# Patient Record
Sex: Male | Born: 1950 | Race: White | Hispanic: No | Marital: Married | Smoking: Never smoker
Health system: Southern US, Community
[De-identification: ages and names within clinical notes are randomized; demographics above are authoritative.]

## PROBLEM LIST (undated history)

## (undated) DIAGNOSIS — T7840XA Allergy, unspecified, initial encounter: Secondary | ICD-10-CM

## (undated) DIAGNOSIS — G4730 Sleep apnea, unspecified: Secondary | ICD-10-CM

## (undated) DIAGNOSIS — R3911 Hesitancy of micturition: Secondary | ICD-10-CM

## (undated) DIAGNOSIS — R569 Unspecified convulsions: Secondary | ICD-10-CM

## (undated) DIAGNOSIS — F329 Major depressive disorder, single episode, unspecified: Secondary | ICD-10-CM

## (undated) DIAGNOSIS — K5790 Diverticulosis of intestine, part unspecified, without perforation or abscess without bleeding: Secondary | ICD-10-CM

## (undated) DIAGNOSIS — G20 Parkinson's disease: Secondary | ICD-10-CM

## (undated) DIAGNOSIS — F32A Depression, unspecified: Secondary | ICD-10-CM

## (undated) DIAGNOSIS — E119 Type 2 diabetes mellitus without complications: Secondary | ICD-10-CM

## (undated) DIAGNOSIS — D126 Benign neoplasm of colon, unspecified: Secondary | ICD-10-CM

## (undated) DIAGNOSIS — G20A1 Parkinson's disease without dyskinesia, without mention of fluctuations: Secondary | ICD-10-CM

## (undated) DIAGNOSIS — I10 Essential (primary) hypertension: Secondary | ICD-10-CM

## (undated) DIAGNOSIS — L570 Actinic keratosis: Secondary | ICD-10-CM

## (undated) HISTORY — DX: Sleep apnea, unspecified: G47.30

## (undated) HISTORY — DX: Major depressive disorder, single episode, unspecified: F32.9

## (undated) HISTORY — DX: Actinic keratosis: L57.0

## (undated) HISTORY — PX: HERNIA REPAIR: SHX51

## (undated) HISTORY — DX: Hesitancy of micturition: R39.11

## (undated) HISTORY — DX: Benign neoplasm of colon, unspecified: D12.6

## (undated) HISTORY — DX: Unspecified convulsions: R56.9

## (undated) HISTORY — DX: Depression, unspecified: F32.A

## (undated) HISTORY — DX: Allergy, unspecified, initial encounter: T78.40XA

## (undated) HISTORY — DX: Essential (primary) hypertension: I10

## (undated) HISTORY — PX: UVULOPALATOPHARYNGOPLASTY, TONSILLECTOMY AND SEPTOPLASTY: SHX2632

## (undated) HISTORY — DX: Diverticulosis of intestine, part unspecified, without perforation or abscess without bleeding: K57.90

## (undated) HISTORY — DX: Type 2 diabetes mellitus without complications: E11.9

## (undated) HISTORY — PX: COLON SURGERY: SHX602

---

## 2001-03-23 ENCOUNTER — Ambulatory Visit (HOSPITAL_BASED_OUTPATIENT_CLINIC_OR_DEPARTMENT_OTHER): Admission: RE | Admit: 2001-03-23 | Discharge: 2001-03-23 | Payer: Self-pay | Admitting: Otolaryngology

## 2005-02-08 ENCOUNTER — Ambulatory Visit: Payer: Self-pay | Admitting: Surgery

## 2009-04-06 ENCOUNTER — Ambulatory Visit: Payer: Self-pay | Admitting: Gastroenterology

## 2014-08-29 ENCOUNTER — Ambulatory Visit: Payer: Self-pay | Admitting: Gastroenterology

## 2015-01-02 LAB — SURGICAL PATHOLOGY

## 2015-06-27 ENCOUNTER — Other Ambulatory Visit: Payer: Self-pay | Admitting: Orthopedic Surgery

## 2015-06-27 DIAGNOSIS — M5416 Radiculopathy, lumbar region: Secondary | ICD-10-CM

## 2015-07-04 ENCOUNTER — Ambulatory Visit
Admission: RE | Admit: 2015-07-04 | Discharge: 2015-07-04 | Disposition: A | Discharge status: Home / Self Care | Payer: BLUE CROSS/BLUE SHIELD | Source: Ambulatory Visit | Attending: Orthopedic Surgery | Admitting: Orthopedic Surgery

## 2015-07-04 DIAGNOSIS — M79605 Pain in left leg: Secondary | ICD-10-CM | POA: Diagnosis present

## 2015-07-04 DIAGNOSIS — M47896 Other spondylosis, lumbar region: Secondary | ICD-10-CM | POA: Insufficient documentation

## 2015-07-04 DIAGNOSIS — M4806 Spinal stenosis, lumbar region: Secondary | ICD-10-CM | POA: Insufficient documentation

## 2015-07-04 DIAGNOSIS — M5136 Other intervertebral disc degeneration, lumbar region: Secondary | ICD-10-CM | POA: Insufficient documentation

## 2015-07-04 DIAGNOSIS — M545 Low back pain: Secondary | ICD-10-CM | POA: Diagnosis present

## 2015-07-04 DIAGNOSIS — M5416 Radiculopathy, lumbar region: Secondary | ICD-10-CM

## 2015-07-04 DIAGNOSIS — R200 Anesthesia of skin: Secondary | ICD-10-CM | POA: Diagnosis present

## 2015-07-13 DIAGNOSIS — M5136 Other intervertebral disc degeneration, lumbar region: Secondary | ICD-10-CM | POA: Insufficient documentation

## 2015-11-08 DIAGNOSIS — K209 Esophagitis, unspecified: Secondary | ICD-10-CM | POA: Diagnosis not present

## 2015-11-08 DIAGNOSIS — G20 Parkinson's disease: Secondary | ICD-10-CM | POA: Diagnosis not present

## 2015-11-08 DIAGNOSIS — I10 Essential (primary) hypertension: Secondary | ICD-10-CM | POA: Diagnosis not present

## 2015-12-29 DIAGNOSIS — G20 Parkinson's disease: Secondary | ICD-10-CM | POA: Diagnosis not present

## 2015-12-29 DIAGNOSIS — E1165 Type 2 diabetes mellitus with hyperglycemia: Secondary | ICD-10-CM | POA: Diagnosis not present

## 2015-12-29 DIAGNOSIS — I10 Essential (primary) hypertension: Secondary | ICD-10-CM | POA: Diagnosis not present

## 2015-12-29 DIAGNOSIS — J069 Acute upper respiratory infection, unspecified: Secondary | ICD-10-CM | POA: Diagnosis not present

## 2015-12-29 DIAGNOSIS — E1129 Type 2 diabetes mellitus with other diabetic kidney complication: Secondary | ICD-10-CM | POA: Diagnosis not present

## 2015-12-29 DIAGNOSIS — R809 Proteinuria, unspecified: Secondary | ICD-10-CM | POA: Diagnosis not present

## 2016-01-03 DIAGNOSIS — E1129 Type 2 diabetes mellitus with other diabetic kidney complication: Secondary | ICD-10-CM | POA: Diagnosis not present

## 2016-01-03 DIAGNOSIS — I10 Essential (primary) hypertension: Secondary | ICD-10-CM | POA: Diagnosis not present

## 2016-01-03 DIAGNOSIS — G20 Parkinson's disease: Secondary | ICD-10-CM | POA: Diagnosis not present

## 2016-01-03 DIAGNOSIS — R809 Proteinuria, unspecified: Secondary | ICD-10-CM | POA: Diagnosis not present

## 2016-01-03 DIAGNOSIS — E1165 Type 2 diabetes mellitus with hyperglycemia: Secondary | ICD-10-CM | POA: Diagnosis not present

## 2016-01-03 DIAGNOSIS — M5136 Other intervertebral disc degeneration, lumbar region: Secondary | ICD-10-CM | POA: Diagnosis not present

## 2016-01-10 DIAGNOSIS — G20 Parkinson's disease: Secondary | ICD-10-CM | POA: Diagnosis not present

## 2016-01-10 DIAGNOSIS — E119 Type 2 diabetes mellitus without complications: Secondary | ICD-10-CM | POA: Insufficient documentation

## 2016-01-10 DIAGNOSIS — G4730 Sleep apnea, unspecified: Secondary | ICD-10-CM | POA: Diagnosis not present

## 2016-01-10 DIAGNOSIS — F3342 Major depressive disorder, recurrent, in full remission: Secondary | ICD-10-CM | POA: Diagnosis not present

## 2016-01-10 DIAGNOSIS — G4710 Hypersomnia, unspecified: Secondary | ICD-10-CM | POA: Diagnosis not present

## 2016-01-10 DIAGNOSIS — Z125 Encounter for screening for malignant neoplasm of prostate: Secondary | ICD-10-CM | POA: Diagnosis not present

## 2016-01-10 DIAGNOSIS — I10 Essential (primary) hypertension: Secondary | ICD-10-CM | POA: Diagnosis not present

## 2016-01-10 DIAGNOSIS — E7800 Pure hypercholesterolemia, unspecified: Secondary | ICD-10-CM | POA: Diagnosis not present

## 2016-01-10 DIAGNOSIS — R809 Proteinuria, unspecified: Secondary | ICD-10-CM | POA: Insufficient documentation

## 2016-01-10 DIAGNOSIS — E1169 Type 2 diabetes mellitus with other specified complication: Secondary | ICD-10-CM | POA: Insufficient documentation

## 2016-01-10 DIAGNOSIS — E118 Type 2 diabetes mellitus with unspecified complications: Secondary | ICD-10-CM | POA: Diagnosis not present

## 2016-01-10 DIAGNOSIS — M5136 Other intervertebral disc degeneration, lumbar region: Secondary | ICD-10-CM | POA: Diagnosis not present

## 2016-01-11 DIAGNOSIS — F418 Other specified anxiety disorders: Secondary | ICD-10-CM | POA: Diagnosis not present

## 2016-01-11 DIAGNOSIS — R4189 Other symptoms and signs involving cognitive functions and awareness: Secondary | ICD-10-CM | POA: Diagnosis not present

## 2016-01-11 DIAGNOSIS — G20 Parkinson's disease: Secondary | ICD-10-CM | POA: Diagnosis not present

## 2016-01-22 DIAGNOSIS — B351 Tinea unguium: Secondary | ICD-10-CM | POA: Diagnosis not present

## 2016-01-22 DIAGNOSIS — L851 Acquired keratosis [keratoderma] palmaris et plantaris: Secondary | ICD-10-CM | POA: Diagnosis not present

## 2016-01-22 DIAGNOSIS — E1142 Type 2 diabetes mellitus with diabetic polyneuropathy: Secondary | ICD-10-CM | POA: Diagnosis not present

## 2016-02-01 ENCOUNTER — Encounter: Payer: Self-pay | Admitting: Psychiatry

## 2016-02-01 ENCOUNTER — Ambulatory Visit (INDEPENDENT_AMBULATORY_CARE_PROVIDER_SITE_OTHER): Payer: PPO | Admitting: Psychiatry

## 2016-02-01 DIAGNOSIS — I10 Essential (primary) hypertension: Secondary | ICD-10-CM | POA: Insufficient documentation

## 2016-02-01 DIAGNOSIS — F32A Depression, unspecified: Secondary | ICD-10-CM | POA: Insufficient documentation

## 2016-02-01 DIAGNOSIS — G4710 Hypersomnia, unspecified: Secondary | ICD-10-CM | POA: Insufficient documentation

## 2016-02-01 DIAGNOSIS — N529 Male erectile dysfunction, unspecified: Secondary | ICD-10-CM | POA: Insufficient documentation

## 2016-02-01 DIAGNOSIS — G20 Parkinson's disease: Secondary | ICD-10-CM | POA: Diagnosis not present

## 2016-02-01 DIAGNOSIS — F411 Generalized anxiety disorder: Secondary | ICD-10-CM

## 2016-02-01 DIAGNOSIS — G4730 Sleep apnea, unspecified: Secondary | ICD-10-CM

## 2016-02-01 DIAGNOSIS — F329 Major depressive disorder, single episode, unspecified: Secondary | ICD-10-CM | POA: Insufficient documentation

## 2016-02-01 DIAGNOSIS — D126 Benign neoplasm of colon, unspecified: Secondary | ICD-10-CM | POA: Insufficient documentation

## 2016-02-01 DIAGNOSIS — E7800 Pure hypercholesterolemia, unspecified: Secondary | ICD-10-CM | POA: Insufficient documentation

## 2016-02-01 NOTE — Progress Notes (Signed)
Psychiatric Initial Adult Assessment   Patient Identification: Randall [name] MRN:  [id number] Date of Evaluation:  02/01/2016 Referral Source: Jennings Books, MD Chief Complaint:   Chief Complaint    Establish Care     Visit Diagnosis:    ICD-9-CM ICD-10-CM   1. Parkinson's disease ([place]) 332.0 G20   2. GAD (generalized anxiety disorder) 300.02 F41.1     History of Present Illness:    Patient is a 65 year old male with history of Parkinson's disease presented for initial assessment. He was referred by his neurologist Dr. [name]. Patient reported that he has history of Parkinson's disease which was diagnosed in 2013. It comes and goes. He had a second opinion at [facility]. He is currently taking Sinemet and Requip on a regular basis. He was referred for psychiatric evaluation due to increased anxiety and depression. He reported that he was prescribed these are for his anxiety but he started having worsening of his symptoms including adverse reaction related to the combination of the medication and he quit taking the BuSpar 2 weeks ago. Since then he has improved. He reported that he was having depression as his wife downsized from her job. He continues to have tremors which worsen related to stress or cold. Patient reported that he enjoys Felisa Bonier and will go out in [place] on Monday nights. Patient appeared well versed during the interview and was discussing in detail about his symptoms related to Parkinson's disease. He reported that he is able to accommodate his symptoms and is comfortable discussing them with other people. He reported that most of the people are receptive of his  condition and will discuss with him. He reported that he sleeps well. He has difficulty swallowing pills which are bigger in size. But he does not have any problem swallowing food or liquid. He reported that he does not have any thoughts to hurt himself. He occasionally feels depressed but is not interested in taking any  medications at this time.  He discussed at length about his past including his childhood and relationship problems with his mother  Associated Signs/Symptoms: Depression Symptoms:  depressed mood, psychomotor retardation, fatigue, feelings of worthlessness/guilt, difficulty concentrating, hopelessness, anxiety, loss of energy/fatigue, disturbed sleep, lack of self confidence (Hypo) Manic Symptoms:  Irritable Mood, Anxiety Symptoms:  Excessive Worry, Psychotic Symptoms:  none PTSD Symptoms: Negative NA  [facility] He reported that he has seen another psychiatrist in [place] area who suggested that he should get ECT treatment approximately 20 years ago. He does not remember the name of the psychiatrist.  Previous Psychotropic Medications: Paxil Buspar   Substance Abuse History in the last 12 months:  No.   Does drink 1 coffee per day Diet soda 3 per day No cigarettes   Consequences of Substance Abuse: Negative NA  Past Medical History:  Past Medical History  Diagnosis Date  . Sleep apnea   . Diverticulosis   . Hypertension   . Diabetes mellitus, type II ([place])   . Seizures ([place])   . Urinary hesitancy   . Depression   . Allergic   . Colon adenoma     Past Surgical History  Procedure Laterality Date  . Hernia repair    . Uvulopalatopharyngoplasty, tonsillectomy and septoplasty    . Colon surgery      Family Psychiatric History:  Brother- Alcoholic Mother- Alcoholic Father- binge drinker    Family History:  Family History  Problem Relation Age of Onset  .  Depression Mother   . Cancer - Colon Mother   . Heart attack Father   . Anuerysm Father   . Alcohol abuse Brother   . Diabetes Brother     Social History:   Social History   Social History  . Marital Status: Married    Spouse Name: N/A  . Number of Children: N/A  . Years of Education: N/A   Social History Main Topics  . Smoking  status: Never Smoker   . Smokeless tobacco: Never Used  . Alcohol Use: No  . Drug Use: No  . Sexual Activity: Not Currently   Other Topics Concern  . None   Social History Narrative  . None    Additional Social History:  He was raised in a negative family environment. She was harsh and controlling. He had 2 older brothers. He did poorly in school and felt that his mother will clash with his brothers and fathers were alcoholics.  He stated that he had unstable upbringing with various conflicts.  Married since 1984. Just retired in March Worked in [employer].  Has step children.    Allergies:   Allergies  Allergen Reactions  . Sulfa Antibiotics Nausea Only    Metabolic Disorder Labs: No results found for: HGBA1C, MPG No results found for: PROLACTIN No results found for: CHOL, TRIG, HDL, CHOLHDL, VLDL, LDLCALC   Current Medications: Current Outpatient Prescriptions  Medication Sig Dispense Refill  . aspirin 81 MG chewable tablet Chew by mouth.    Marland Kitchen azelastine (ASTELIN) 0.1 % nasal spray Place into the nose.    . busPIRone (BUSPAR) 15 MG tablet Take 15 mg by mouth.    . carbidopa-levodopa (SINEMET IR) 25-250 MG tablet Take 25-250 tablets by mouth 4 (four) times daily.    . fluticasone (FLONASE) 50 MCG/ACT nasal spray     . glimepiride (AMARYL) 4 MG tablet Take 4 mg by mouth 2 (two) times daily.    Marland Kitchen glucose blood (ONE TOUCH ULTRA TEST) test strip     . ibuprofen (ADVIL,MOTRIN) 200 MG tablet Take 200 mg by mouth every 6 (six) hours as needed.    Elmore Guise Devices ([employer] LANCING DEVICE) MISC     . losartan (COZAAR) 50 MG tablet Take 50 mg by mouth.    . lovastatin (MEVACOR) 40 MG tablet Take 40 mg by mouth.    . Melatonin 3 MG TABS Take by mouth.    . metFORMIN (GLUCOPHAGE) 500 MG tablet Take 500 mg by mouth 2 (two) times daily.    . Multiple Vitamin (MULTI-VITAMINS) TABS Take by mouth.    Marland Kitchen rOPINIRole (REQUIP) 2 MG tablet Take 2 mg by mouth.     No current  facility-administered medications for this visit.    Neurologic: Headache: No Seizure: No Paresthesias:No  Musculoskeletal: Strength & Muscle Tone: within normal limits Gait & Station: normal Patient leans: N/A  Psychiatric Specialty Exam: ROS  There were no vitals taken for this visit.There is no height or weight on file to calculate BMI.  General Appearance: Casual  Eye Contact:  Good  Speech:  Pressured  Volume:  Normal  Mood:  Anxious  Affect:  Congruent  Thought Process:  Coherent  Orientation:  Full (Time, Place, and Person)  Thought Content:  WDL  Suicidal Thoughts:  No  Homicidal Thoughts:  No  Memory:  Immediate;   Fair Recent;   Fair Remote;   Fair  Judgement:  Impaired  Insight:  Fair  Psychomotor Activity:  Psychomotor Retardation  Concentration:  Concentration: Fair and Attention Span: Fair  Recall:  [employer] of Knowledge:Fair  Language: Fair  Akathisia:  No  Handed:  Right  AIMS (if indicated):    Assets:  Communication Skills Desire for Improvement Physical Health Social Support  ADL's:  Intact  Cognition: WNL  Sleep:      Treatment Plan Summary: Medication management   Discussed with patient about the medication options but he reported that he is currently stable and does not want to start taking any medication at this time. He will follow up in 1 month or earlier and decide if he wants to take any medications for depression and anxiety.    More than 50% of the time spent in psychoeducation, counseling and coordination of care.    This note was generated in part or whole with voice recognition software. Voice regonition is usually quite accurate but there are transcription errors that can and very often do occur. I apologize for any typographical errors that were not detected and corrected.    Rainey Pines, MD 5/25/20172:42 PM

## 2016-02-26 ENCOUNTER — Encounter: Payer: Self-pay | Admitting: Psychiatry

## 2016-02-26 ENCOUNTER — Ambulatory Visit (INDEPENDENT_AMBULATORY_CARE_PROVIDER_SITE_OTHER): Payer: PPO | Admitting: Psychiatry

## 2016-02-26 VITALS — BP 118/80 | HR 83 | Temp 97.7°F | Wt 176.8 lb

## 2016-02-26 DIAGNOSIS — F411 Generalized anxiety disorder: Secondary | ICD-10-CM | POA: Diagnosis not present

## 2016-02-26 DIAGNOSIS — G20 Parkinson's disease: Secondary | ICD-10-CM | POA: Diagnosis not present

## 2016-02-26 MED ORDER — BUSPIRONE HCL 15 MG PO TABS: 15.0000 mg | ORAL_TABLET | Freq: Every day | ORAL | Status: DC

## 2016-02-26 NOTE — Progress Notes (Signed)
Psychiatric MD Progress Note  Patient Identification: Randall [name] MRN:  [id number] Date of Evaluation:  02/26/2016 Referral Source: Jennings Books, MD Chief Complaint:   Chief Complaint    Follow-up; Medication Refill     Visit Diagnosis:    ICD-9-CM ICD-10-CM   1. Parkinson's disease ([place]) 332.0 G20   2. GAD (generalized anxiety disorder) 300.02 F41.1     History of Present Illness:    Patient is a 65 year old male with history of Parkinson's disease presented for follow up.  He was referred by his neurologist Dr. [name]. Patient reported that he has history of Parkinson's disease which was diagnosed in 2013. He was discussing in detail about his Parkinson's disease. He reported that he has been compliant with his medications. He reported that he has anxiety related to his Parkinson's disease but he is selected to start the respondent as he has been taking it on the when necessary basis. He was discussing in detail about his symptoms related to her Parkinson's disease and was talking about the tremors. He reported that they are related to the stress. He was also talking about the people who will look at him and will ask about his tremors. He discussed about his journey related to his Parkinson's disease. Patient reported that he is not feeling depressed at this time. We discussed what the medications at length. However patient does not want to start any new medication at this time.  He currently denied having any suicidal ideations or plans. He reported that he has seen several psychiatrists in the past and they always talked about giving him ECT. He is interested in getting of quickfix but does not want to take a long-term medication. He reported that he has quit taking the BuSpar but will start the medication again at this time. He denied having any suicidal homicidal ideations or plans. He appeared pleasant and cooperative during the interview.   Associated Signs/Symptoms: Depression  Symptoms:  depressed mood, psychomotor retardation, fatigue, feelings of worthlessness/guilt, difficulty concentrating, hopelessness, anxiety, loss of energy/fatigue, disturbed sleep, lack of self confidence (Hypo) Manic Symptoms:  Irritable Mood, Anxiety Symptoms:  Excessive Worry, Psychotic Symptoms:  none PTSD Symptoms: Negative NA  [facility] He reported that he has seen another psychiatrist in [place] area who suggested that he should get ECT treatment approximately 20 years ago. He does not remember the name of the psychiatrist.  Previous Psychotropic Medications: Paxil Buspar   Substance Abuse History in the last 12 months:  No.   Does drink 1 coffee per day Diet soda 3 per day No cigarettes   Consequences of Substance Abuse: Negative NA  Past Medical History:  Past Medical History  Diagnosis Date  . Sleep apnea   . Diverticulosis   . Hypertension   . Diabetes mellitus, type II ([place])   . Seizures ([place])   . Urinary hesitancy   . Depression   . Allergic   . Colon adenoma     Past Surgical History  Procedure Laterality Date  . Hernia repair    . Uvulopalatopharyngoplasty, tonsillectomy and septoplasty    . Colon surgery      Family Psychiatric History:  Brother- Alcoholic Mother- Alcoholic Father- binge drinker    Family History:  Family History  Problem Relation Age of Onset  . Depression Mother   . Cancer - Colon Mother   . Heart attack Father   . Anuerysm Father   . Alcohol abuse  Brother   . Diabetes Brother     Social History:   Social History   Social History  . Marital Status: Married    Spouse Name: N/A  . Number of Children: N/A  . Years of Education: N/A   Social History Main Topics  . Smoking status: Never Smoker   . Smokeless tobacco: Never Used  . Alcohol Use: No  . Drug Use: No  . Sexual Activity: Not Currently   Other Topics Concern  . None   Social History  Narrative    Additional Social History:  He was raised in a negative family environment. She was harsh and controlling. He had 2 older brothers. He did poorly in school and felt that his mother will clash with his brothers and fathers were alcoholics.  He stated that he had unstable upbringing with various conflicts.  Married since 1984. Just retired in March Worked in [employer].  Has step children.    Allergies:   Allergies  Allergen Reactions  . Sulfa Antibiotics Nausea Only    Metabolic Disorder Labs: No results found for: HGBA1C, MPG No results found for: PROLACTIN No results found for: CHOL, TRIG, HDL, CHOLHDL, VLDL, LDLCALC   Current Medications: Current Outpatient Prescriptions  Medication Sig Dispense Refill  . aspirin 81 MG chewable tablet Chew by mouth.    Marland Kitchen azelastine (ASTELIN) 0.1 % nasal spray Place into the nose.    . busPIRone (BUSPAR) 15 MG tablet Take 15 mg by mouth.    . carbidopa-levodopa (SINEMET IR) 25-250 MG tablet Take 25-250 tablets by mouth 4 (four) times daily.    . fluticasone (FLONASE) 50 MCG/ACT nasal spray     . glimepiride (AMARYL) 4 MG tablet Take 4 mg by mouth 2 (two) times daily.    Marland Kitchen glucose blood (ONE TOUCH ULTRA TEST) test strip     . ibuprofen (ADVIL,MOTRIN) 200 MG tablet Take 200 mg by mouth every 6 (six) hours as needed.    El[employer] Gu[employer] Devices ([employer] LANCING DEVICE) MISC     . losartan (COZAAR) 50 MG tablet Take 50 mg by mouth.    . lovastatin (MEVACOR) 40 MG tablet Take 40 mg by mouth.    . Melatonin 3 MG TABS Take by mouth.    . metFORMIN (GLUCOPHAGE) 500 MG tablet Take 500 mg by mouth 2 (two) times daily.    . Multiple Vitamin (MULTI-VITAMINS) TABS Take by mouth.    Marland Kitchen rOPINIRole (REQUIP) 2 MG tablet Take 2 mg by mouth.     No current facility-administered medications for this visit.    Neurologic: Headache: No Seizure: No Paresthesias:No  Musculoskeletal: Strength & Muscle Tone: within normal limits Gait & Station:  normal Patient leans: N/A  Psychiatric Specialty Exam: Review of Systems  Constitutional: Positive for malaise/fatigue.  Musculoskeletal: Positive for back pain and joint pain.  Neurological: Positive for tremors and weakness.  Psychiatric/Behavioral: The patient is nervous/anxious and has insomnia.   All other systems reviewed and are negative.   Blood pressure 118/80, pulse 83, temperature 97.7 F (36.5 C), temperature source Tympanic, weight 176 lb 12.8 oz (80.196 kg), SpO2 97 %.There is no height on file to calculate BMI.  General Appearance: Casual  Eye Contact:  Good  Speech:  Pressured  Volume:  Normal  Mood:  Anxious  Affect:  Congruent  Thought Process:  Coherent  Orientation:  Full (Time, Place, and Person)  Thought Content:  WDL  Suicidal Thoughts:  No  Homicidal Thoughts:  No  Memory:  Immediate;   Fair Recent;   Fair Remote;   Fair  Judgement:  Impaired  Insight:  Fair  Psychomotor Activity:  Psychomotor Retardation  Concentration:  Concentration: Fair and Attention Span: Fair  Recall:  [employer] of Knowledge:Fair  Language: Fair  Akathisia:  No  Handed:  Right  AIMS (if indicated):    Assets:  Communication Skills Desire for Improvement Physical Health Social Support  ADL's:  Intact  Cognition: WNL  Sleep:      Treatment Plan Summary: Medication management  He will start taking BuSpar 15 mg daily Discussed with him about the side effects the medication in detail and he demonstrated understanding.   More than 50% of the time spent in psychoeducation, counseling and coordination of care.    This note was generated in part or whole with voice recognition software. Voice regonition is usually quite accurate but there are transcription errors that can and very often do occur. I apologize for any typographical errors that were not detected and corrected.    Rainey Pines, MD 6/19/20171:33 PM

## 2016-03-27 ENCOUNTER — Ambulatory Visit (INDEPENDENT_AMBULATORY_CARE_PROVIDER_SITE_OTHER): Payer: PPO | Admitting: Psychiatry

## 2016-03-27 ENCOUNTER — Encounter: Payer: Self-pay | Admitting: Psychiatry

## 2016-03-27 VITALS — BP 110/76 | HR 82 | Temp 97.2°F | Ht 70.0 in | Wt 177.0 lb

## 2016-03-27 DIAGNOSIS — F411 Generalized anxiety disorder: Secondary | ICD-10-CM | POA: Diagnosis not present

## 2016-03-27 DIAGNOSIS — G20 Parkinson's disease: Secondary | ICD-10-CM

## 2016-03-27 MED ORDER — METHYLPHENIDATE HCL 5 MG PO TABS: 5.0000 mg | ORAL_TABLET | Freq: Every morning | ORAL | Status: DC

## 2016-03-27 MED ORDER — BUSPIRONE HCL 15 MG PO TABS: 15.0000 mg | ORAL_TABLET | Freq: Every day | ORAL | Status: DC

## 2016-03-27 NOTE — Progress Notes (Signed)
Psychiatric MD Progress Note  Patient Identification: Randall [name] MRN:  [id number] Date of Evaluation:  03/27/2016 Referral Source: Jennings Books, MD Chief Complaint:   Chief Complaint    Follow-up; Medication Refill     Visit Diagnosis:    ICD-9-CM ICD-10-CM   1. Parkinson's disease ([place]) 332.0 G20   2. GAD (generalized anxiety disorder) 300.02 F41.1     History of Present Illness:    Patient is a 65 year old male with history of Parkinson's disease presented for follow up.  He was referred by his neurologist Dr. [name]. Patient reported that he has Been doing well on his medications. He reported that he has started taking BuSpar at night but he was unable to sleep because he felt more energetic. He has been spending time with his family members and going out to eat dinner. He is also singing every Monday night and is excited about the same. Patient reported that his and will start shaking because of his excitement. He is hiding behind the podium. Marland Kitchen He discussed about that in detail. Patient reported that he needs help with his concentration as he will forget the songs. We discussed about the medications in detail. He is calm and alert during the interview. He denied having any suicidal ideations or plans. He discussed about his symptoms of Parkinson's disease in detail. Patient reported that he is not feeling depressed at this time. He appeared pleasant and cooperative during the interview.   Associated Signs/Symptoms: Depression Symptoms:  fatigue, difficulty concentrating, anxiety, disturbed sleep, (Hypo) Manic Symptoms:  Irritable Mood, Anxiety Symptoms:  Excessive Worry, Psychotic Symptoms:  none PTSD Symptoms: Negative NA  [facility] He reported that he has seen another psychiatrist in [place] area who suggested that he should get ECT treatment approximately 20 years ago. He does not remember the name of the  psychiatrist.  Previous Psychotropic Medications: Paxil Buspar   Substance Abuse History in the last 12 months:  No.   Does drink 1 coffee per day Diet soda 3 per day No cigarettes   Consequences of Substance Abuse: Negative NA  Past Medical History:  Past Medical History  Diagnosis Date  . Sleep apnea   . Diverticulosis   . Hypertension   . Diabetes mellitus, type II ([place])   . Seizures ([place])   . Urinary hesitancy   . Depression   . Allergic   . Colon adenoma     Past Surgical History  Procedure Laterality Date  . Hernia repair    . Uvulopalatopharyngoplasty, tonsillectomy and septoplasty    . Colon surgery      Family Psychiatric History:  Brother- Alcoholic Mother- Alcoholic Father- binge drinker    Family History:  Family History  Problem Relation Age of Onset  . Depression Mother   . Cancer - Colon Mother   . Heart attack Father   . Anuerysm Father   . Alcohol abuse Brother   . Diabetes Brother     Social History:   Social History   Social History  . Marital Status: Married    Spouse Name: N/A  . Number of Children: N/A  . Years of Education: N/A   Social History Main Topics  . Smoking status: Never Smoker   . Smokeless tobacco: Never Used  . Alcohol Use: No  . Drug Use: No  . Sexual Activity: Not Currently   Other Topics Concern  . None   Social History Narrative  Additional Social History:  He was raised in a negative family environment. She was harsh and controlling. He had 2 older brothers. He did poorly in school and felt that his mother will clash with his brothers and fathers were alcoholics.  He stated that he had unstable upbringing with various conflicts.  Married since 1984. Just retired in March Worked in [employer].  Has step children.    Allergies:   Allergies  Allergen Reactions  . Sulfa Antibiotics Nausea Only    Metabolic Disorder Labs: No results found for: HGBA1C, MPG No results found for:  PROLACTIN No results found for: CHOL, TRIG, HDL, CHOLHDL, VLDL, LDLCALC   Current Medications: Current Outpatient Prescriptions  Medication Sig Dispense Refill  . aspirin 81 MG chewable tablet Chew by mouth.    Marland Kitchen azelastine (ASTELIN) 0.1 % nasal spray Place into the nose.    . busPIRone (BUSPAR) 15 MG tablet Take 1 tablet (15 mg total) by mouth daily with supper. 30 tablet 1  . carbidopa-levodopa (SINEMET IR) 25-250 MG tablet Take 25-250 tablets by mouth 4 (four) times daily.    . fluticasone (FLONASE) 50 MCG/ACT nasal spray     . glimepiride (AMARYL) 4 MG tablet Take 4 mg by mouth 2 (two) times daily.    Marland Kitchen glucose blood (ONE TOUCH ULTRA TEST) test strip     . ibuprofen (ADVIL,MOTRIN) 200 MG tablet Take 200 mg by mouth every 6 (six) hours as needed.    El[employer] Gu[employer] Devices ([employer] LANCING DEVICE) MISC     . losartan (COZAAR) 50 MG tablet Take 50 mg by mouth.    . lovastatin (MEVACOR) 40 MG tablet Take 40 mg by mouth.    . Melatonin 3 MG TABS Take by mouth.    . metFORMIN (GLUCOPHAGE) 500 MG tablet Take 500 mg by mouth 2 (two) times daily.    . Multiple Vitamin (MULTI-VITAMINS) TABS Take by mouth.    Marland Kitchen rOPINIRole (REQUIP) 2 MG tablet Take 2 mg by mouth.     No current facility-administered medications for this visit.    Neurologic: Headache: No Seizure: No Paresthesias:No  Musculoskeletal: Strength & Muscle Tone: within normal limits Gait & Station: normal Patient leans: N/A  Psychiatric Specialty Exam: Review of Systems  Constitutional: Positive for malaise/fatigue.  Musculoskeletal: Positive for back pain and joint pain.  Neurological: Positive for tremors and weakness.  Psychiatric/Behavioral: The patient is nervous/anxious and has insomnia.   All other systems reviewed and are negative.   Blood pressure 110/76, pulse 82, temperature 97.2 F (36.2 C), temperature source Tympanic, height 5\' 10"  (1.778 m), weight 177 lb (80.287 kg), SpO2 93 %.Body mass index is 25.4 kg/(m^2).   General Appearance: Casual  Eye Contact:  Good  Speech:  Pressured  Volume:  Normal  Mood:  Anxious  Affect:  Congruent  Thought Process:  Coherent  Orientation:  Full (Time, Place, and Person)  Thought Content:  WDL  Suicidal Thoughts:  No  Homicidal Thoughts:  No  Memory:  Immediate;   Fair Recent;   Fair Remote;   Fair  Judgement:  Impaired  Insight:  Fair  Psychomotor Activity:  Psychomotor Retardation  Concentration:  Concentration: Fair and Attention Span: Fair  Recall:  [employer] of Knowledge:Fair  Language: Fair  Akathisia:  No  Handed:  Right  AIMS (if indicated):    Assets:  Communication Skills Desire for Improvement Physical Health Social Support  ADL's:  Intact  Cognition: WNL  Sleep:  Treatment Plan Summary: Medication management  Continue BuSpar 15 mg daily I'll start him on Ritalin 5 mg advised to take half pill on a when necessary basis and he agreed with the plan. Discussed with him about the side effects the medication in detail and he demonstrated understanding. Follow-up in 2 months   More than 50% of the time spent in psychoeducation, counseling and coordination of care.    This note was generated in part or whole with voice recognition software. Voice regonition is usually quite accurate but there are transcription errors that can and very often do occur. I apologize for any typographical errors that were not detected and corrected.    Rainey Pines, MD 7/19/20171:32 PM

## 2016-04-12 DIAGNOSIS — R4189 Other symptoms and signs involving cognitive functions and awareness: Secondary | ICD-10-CM | POA: Diagnosis not present

## 2016-04-12 DIAGNOSIS — G20 Parkinson's disease: Secondary | ICD-10-CM | POA: Diagnosis not present

## 2016-04-23 DIAGNOSIS — B351 Tinea unguium: Secondary | ICD-10-CM | POA: Diagnosis not present

## 2016-04-23 DIAGNOSIS — E1142 Type 2 diabetes mellitus with diabetic polyneuropathy: Secondary | ICD-10-CM | POA: Diagnosis not present

## 2016-04-23 DIAGNOSIS — L851 Acquired keratosis [keratoderma] palmaris et plantaris: Secondary | ICD-10-CM | POA: Diagnosis not present

## 2016-05-06 DIAGNOSIS — M216X1 Other acquired deformities of right foot: Secondary | ICD-10-CM | POA: Diagnosis not present

## 2016-05-06 DIAGNOSIS — M216X2 Other acquired deformities of left foot: Secondary | ICD-10-CM | POA: Diagnosis not present

## 2016-05-06 DIAGNOSIS — E1142 Type 2 diabetes mellitus with diabetic polyneuropathy: Secondary | ICD-10-CM | POA: Diagnosis not present

## 2016-05-15 ENCOUNTER — Telehealth: Payer: Self-pay

## 2016-05-15 NOTE — Telephone Encounter (Signed)
wife called states that the patient has only takin 1 ritalin.  that was saturday sept 2.  pt wife states that when he talked he would move his head alot so he stopped takin it.  pt has appt on  05-22-16.  pt was told that dr. Gretel Acre was not in the office this week. Pt's wife was told that he had an appt on  05-22-16 and that a message would be sent to dr. Gretel Acre but they could discuss at appt.

## 2016-05-20 NOTE — Telephone Encounter (Signed)
Will discuss at his appointment

## 2016-05-22 ENCOUNTER — Encounter: Payer: Self-pay | Admitting: Psychiatry

## 2016-05-22 ENCOUNTER — Ambulatory Visit (INDEPENDENT_AMBULATORY_CARE_PROVIDER_SITE_OTHER): Payer: PPO | Admitting: Psychiatry

## 2016-05-22 VITALS — BP 145/90 | HR 65 | Temp 98.4°F | Ht 70.0 in | Wt 177.2 lb

## 2016-05-22 DIAGNOSIS — F411 Generalized anxiety disorder: Secondary | ICD-10-CM

## 2016-05-22 DIAGNOSIS — G20 Parkinson's disease: Secondary | ICD-10-CM | POA: Diagnosis not present

## 2016-05-22 MED ORDER — ROPINIROLE HCL 2 MG PO TABS: 2.0000 mg | ORAL_TABLET | Freq: Every day | ORAL | 1 refills | Status: AC

## 2016-05-22 MED ORDER — BUSPIRONE HCL 15 MG PO TABS: 15.0000 mg | ORAL_TABLET | Freq: Every day | ORAL | 1 refills | Status: DC
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
## 2016-05-23 ENCOUNTER — Telehealth: Payer: Self-pay

## 2016-05-23 NOTE — Telephone Encounter (Signed)
received noticed that there is an existing approval on file for medication methylphenidate hcl 5mg  dated until 09-08-16

## 2016-06-24 ENCOUNTER — Ambulatory Visit: Payer: PPO | Admitting: Psychiatry

## 2016-07-08 ENCOUNTER — Encounter: Payer: Self-pay | Admitting: Psychiatry

## 2016-07-08 ENCOUNTER — Ambulatory Visit (INDEPENDENT_AMBULATORY_CARE_PROVIDER_SITE_OTHER): Payer: PPO | Admitting: Psychiatry

## 2016-07-08 VITALS — BP 130/76 | HR 68 | Ht 69.0 in | Wt 179.0 lb

## 2016-07-08 DIAGNOSIS — Z125 Encounter for screening for malignant neoplasm of prostate: Secondary | ICD-10-CM | POA: Diagnosis not present

## 2016-07-08 DIAGNOSIS — M5136 Other intervertebral disc degeneration, lumbar region: Secondary | ICD-10-CM | POA: Diagnosis not present

## 2016-07-08 DIAGNOSIS — F411 Generalized anxiety disorder: Secondary | ICD-10-CM

## 2016-07-08 DIAGNOSIS — E118 Type 2 diabetes mellitus with unspecified complications: Secondary | ICD-10-CM | POA: Diagnosis not present

## 2016-07-08 DIAGNOSIS — I10 Essential (primary) hypertension: Secondary | ICD-10-CM | POA: Diagnosis not present

## 2016-07-08 DIAGNOSIS — G4710 Hypersomnia, unspecified: Secondary | ICD-10-CM | POA: Diagnosis not present

## 2016-07-08 DIAGNOSIS — G20 Parkinson's disease: Secondary | ICD-10-CM

## 2016-07-08 DIAGNOSIS — G4730 Sleep apnea, unspecified: Secondary | ICD-10-CM | POA: Diagnosis not present

## 2016-07-08 MED ORDER — BUSPIRONE HCL 15 MG PO TABS: 15.0000 mg | ORAL_TABLET | Freq: Every day | ORAL | 1 refills | Status: DC

## 2016-07-08 NOTE — Progress Notes (Signed)
Psychiatric MD Progress Note  Patient Identification: Randall [name] MRN:  [id number] Date of Evaluation:  07/08/2016 Referral Source: Jennings Books, MD Chief Complaint:    Visit Diagnosis:    ICD-9-CM ICD-10-CM   1. Parkinson's disease ([place]) 332.0 G20   2. GAD (generalized anxiety disorder) 300.02 F41.1     History of Present Illness:    Patient is a 65 year old married male with history of Parkinson's disease presented for follow up.  He was referred by his neurologist Dr. [name]. Patient Stated that he has noticed that his symptoms are getting worse for the past one month. It might be due to the change in the cold weather. He stated that cold air  irritates him, he starts shaking and has more tremors in his hands. He appeared apprehensive during the interview. He was stating that he went to the restaurant on Thursday with  his family and as he was coming out he started having more tremors. We discussed about making himself comfortable and not coming out in the cold air. He reported that he has been compliant with his medications and  the BuSpar is helping with his anxiety. He currently denied having any depression. Marland Kitchen He does not want to have his medications adjusted at this time.  Patient stated that he has been singing in the church every Monday night and has been going for the choir practice on Wednesday nights. He remains active and keeps himself involved. He reported that anxiety also affects his speech. He has been trying to control his anxiety by doing more social activities. He was receptive to suggestions at this time.  He is also taking Requip to help with his restless legs. He stated that his wife is very supportive.     Associated Signs/Symptoms: Depression Symptoms:  insomnia, fatigue, difficulty concentrating, anxiety, loss of energy/fatigue, disturbed sleep, (Hypo) Manic Symptoms:  Irritable Mood, Anxiety Symptoms:  Excessive Worry, Psychotic Symptoms:  none PTSD  Symptoms: Negative NA  [facility] He reported that he has seen another psychiatrist in [place] area who suggested that he should get ECT treatment approximately 20 years ago. He does not remember the name of the psychiatrist.  Previous Psychotropic Medications: Paxil Buspar   Substance Abuse History in the last 12 months:  No.   Does drink 1 coffee per day Diet soda 3 per day No cigarettes   Consequences of Substance Abuse: Negative NA  Past Medical History:  Past Medical History:  Diagnosis Date  . Allergic   . Colon adenoma   . Depression   . Diabetes mellitus, type II ([place])   . Diverticulosis   . Hypertension   . Seizures ([place])   . Sleep apnea   . Urinary hesitancy     Past Surgical History:  Procedure Laterality Date  . COLON SURGERY    . HERNIA REPAIR    . UVULOPALATOPHARYNGOPLASTY, TONSILLECTOMY AND SEPTOPLASTY      Family Psychiatric History:  Brother- Alcoholic Mother- Alcoholic Father- binge drinker    Family History:  Family History  Problem Relation Age of Onset  . Depression Mother   . Cancer - Colon Mother   . Heart attack Father   . Anuerysm Father   . Alcohol abuse Brother   . Diabetes Brother     Social History:   Social History   Social History  . Marital status: Married    Spouse name: N/A  . Number of children: N/A  .  Years of education: N/A   Social History Main Topics  . Smoking status: Never Smoker  . Smokeless tobacco: Never Used  . Alcohol use 0.6 oz/week    1 Cans of beer per week     Comment: socially   . Drug use: No  . Sexual activity: Not Currently   Other Topics Concern  . None   Social History Narrative  . None    Additional Social History:  He was raised in a negative family environment. She was harsh and controlling. He had 2 older brothers. He did poorly in school and felt that his mother will clash with his brothers and fathers were alcoholics.   He stated that he had unstable upbringing with various conflicts.  Married since 1984. Just retired in March Worked in [employer].  Has step children.    Allergies:   Allergies  Allergen Reactions  . Sulfa Antibiotics Nausea Only    Metabolic Disorder Labs: No results found for: HGBA1C, MPG No results found for: PROLACTIN No results found for: CHOL, TRIG, HDL, CHOLHDL, VLDL, LDLCALC   Current Medications: Current Outpatient Prescriptions  Medication Sig Dispense Refill  . aspirin 81 MG chewable tablet Chew by mouth.    Marland Kitchen azelastine (ASTELIN) 0.1 % nasal spray Place into the nose.    . busPIRone (BUSPAR) 15 MG tablet Take 1 tablet (15 mg total) by mouth daily with supper. 30 tablet 1  . carbidopa-levodopa (SINEMET IR) 25-250 MG tablet Take 25-250 tablets by mouth 4 (four) times daily.    . fluticasone (FLONASE) 50 MCG/ACT nasal spray     . glimepiride (AMARYL) 4 MG tablet Take 4 mg by mouth 2 (two) times daily.    Marland Kitchen glucose blood (ONE TOUCH ULTRA TEST) test strip     . ibuprofen (ADVIL,MOTRIN) 200 MG tablet Take 200 mg by mouth every 6 (six) hours as needed.    Marland Kitchen losartan (COZAAR) 50 MG tablet Take 50 mg by mouth.    . lovastatin (MEVACOR) 40 MG tablet Take 40 mg by mouth.    . Melatonin 3 MG TABS Take by mouth.    . metFORMIN (GLUCOPHAGE) 500 MG tablet Take 500 mg by mouth 2 (two) times daily.    . Multiple Vitamin (MULTI-VITAMINS) TABS Take by mouth.    Marland Kitchen rOPINIRole (REQUIP) 2 MG tablet Take 1 tablet (2 mg total) by mouth at bedtime. 30 tablet 1   No current facility-administered medications for this visit.     Neurologic: Headache: No Seizure: No Paresthesias:No  Musculoskeletal: Strength & Muscle Tone: within normal limits Gait & Station: normal Patient leans: N/A  Psychiatric Specialty Exam: Review of Systems  Constitutional: Positive for malaise/fatigue.  Musculoskeletal: Positive for back pain and joint pain.  Neurological: Positive for tremors and  weakness.  Psychiatric/Behavioral: The patient is nervous/anxious and has insomnia.   All other systems reviewed and are negative.   Blood pressure 130/76, pulse 68, height 5\' 9"  (1.753 m), weight 179 lb (81.2 kg).Body mass index is 26.43 kg/m.  General Appearance: Casual  Eye Contact:  Good  Speech:  Pressured  Volume:  Normal  Mood:  Anxious  Affect:  Congruent  Thought Process:  Coherent  Orientation:  Full (Time, Place, and Person)  Thought Content:  WDL  Suicidal Thoughts:  No  Homicidal Thoughts:  No  Memory:  Immediate;   Fair Recent;   Fair Remote;   Fair  Judgement:  Impaired  Insight:  Fair  Psychomotor Activity:  Psychomotor Retardation  Concentration:  Concentration: Fair and Attention Span: Fair  Recall:  [employer] of Knowledge:Fair  Language: Fair  Akathisia:  No  Handed:  Right  AIMS (if indicated):    Assets:  Communication Skills Desire for Improvement Physical Health Social Support  ADL's:  Intact  Cognition: WNL  Sleep:      Treatment Plan Summary: Medication management   Continue BuSpar 15 mg daily  Discussed with him about the side effects the medication in detail and he demonstrated understanding. Follow-up in 2  months   More than 50% of the time spent in psychoeducation, counseling and coordination of care.   Advised patient that I will be leaving this office in end of November and he demonstrated understanding.  This note was generated in part or whole with voice recognition software. Voice regonition is usually quite accurate but there are transcription errors that can and very often do occur. I apologize for any typographical errors that were not detected and corrected.    Rainey Pines, MD 10/30/20172:18 PM

## 2016-07-15 DIAGNOSIS — Z0000 Encounter for general adult medical examination without abnormal findings: Secondary | ICD-10-CM | POA: Diagnosis not present

## 2016-07-15 DIAGNOSIS — M5136 Other intervertebral disc degeneration, lumbar region: Secondary | ICD-10-CM | POA: Diagnosis not present

## 2016-07-15 DIAGNOSIS — I10 Essential (primary) hypertension: Secondary | ICD-10-CM | POA: Diagnosis not present

## 2016-07-15 DIAGNOSIS — Z23 Encounter for immunization: Secondary | ICD-10-CM | POA: Diagnosis not present

## 2016-07-15 DIAGNOSIS — G4710 Hypersomnia, unspecified: Secondary | ICD-10-CM | POA: Diagnosis not present

## 2016-07-15 DIAGNOSIS — G4730 Sleep apnea, unspecified: Secondary | ICD-10-CM | POA: Diagnosis not present

## 2016-07-15 DIAGNOSIS — R809 Proteinuria, unspecified: Secondary | ICD-10-CM | POA: Diagnosis not present

## 2016-07-15 DIAGNOSIS — Z791 Long term (current) use of non-steroidal anti-inflammatories (NSAID): Secondary | ICD-10-CM | POA: Diagnosis not present

## 2016-07-15 DIAGNOSIS — E7800 Pure hypercholesterolemia, unspecified: Secondary | ICD-10-CM | POA: Diagnosis not present

## 2016-07-15 DIAGNOSIS — F3340 Major depressive disorder, recurrent, in remission, unspecified: Secondary | ICD-10-CM | POA: Diagnosis not present

## 2016-07-15 DIAGNOSIS — G20 Parkinson's disease: Secondary | ICD-10-CM | POA: Diagnosis not present

## 2016-07-15 DIAGNOSIS — E118 Type 2 diabetes mellitus with unspecified complications: Secondary | ICD-10-CM | POA: Diagnosis not present

## 2016-07-24 DIAGNOSIS — L578 Other skin changes due to chronic exposure to nonionizing radiation: Secondary | ICD-10-CM | POA: Diagnosis not present

## 2016-07-24 DIAGNOSIS — L570 Actinic keratosis: Secondary | ICD-10-CM | POA: Diagnosis not present

## 2016-07-30 DIAGNOSIS — E1142 Type 2 diabetes mellitus with diabetic polyneuropathy: Secondary | ICD-10-CM | POA: Diagnosis not present

## 2016-07-30 DIAGNOSIS — L851 Acquired keratosis [keratoderma] palmaris et plantaris: Secondary | ICD-10-CM | POA: Diagnosis not present

## 2016-07-30 DIAGNOSIS — B351 Tinea unguium: Secondary | ICD-10-CM | POA: Diagnosis not present

## 2016-09-05 ENCOUNTER — Ambulatory Visit: Payer: PPO | Admitting: Psychiatry

## 2016-09-10 DIAGNOSIS — S90811A Abrasion, right foot, initial encounter: Secondary | ICD-10-CM | POA: Diagnosis not present

## 2016-09-10 DIAGNOSIS — E1142 Type 2 diabetes mellitus with diabetic polyneuropathy: Secondary | ICD-10-CM | POA: Diagnosis not present

## 2016-09-16 DIAGNOSIS — R4189 Other symptoms and signs involving cognitive functions and awareness: Secondary | ICD-10-CM | POA: Diagnosis not present

## 2016-09-16 DIAGNOSIS — G20 Parkinson's disease: Secondary | ICD-10-CM | POA: Diagnosis not present

## 2016-10-04 ENCOUNTER — Ambulatory Visit (INDEPENDENT_AMBULATORY_CARE_PROVIDER_SITE_OTHER): Payer: PPO | Admitting: Psychiatry

## 2016-10-04 ENCOUNTER — Encounter: Payer: Self-pay | Admitting: Psychiatry

## 2016-10-04 VITALS — BP 143/90 | HR 98 | Temp 97.7°F | Wt 179.4 lb

## 2016-10-04 DIAGNOSIS — G20 Parkinson's disease: Secondary | ICD-10-CM | POA: Diagnosis not present

## 2016-10-04 DIAGNOSIS — F411 Generalized anxiety disorder: Secondary | ICD-10-CM

## 2016-10-04 MED ORDER — BUSPIRONE HCL 15 MG PO TABS: 15.0000 mg | ORAL_TABLET | Freq: Every day | ORAL | 1 refills | Status: DC

## 2016-10-04 NOTE — Progress Notes (Signed)
Psychiatric MD Progress Note  Patient Identification: Randall [name] MRN:  [id number] Date of Evaluation:  10/04/2016 Referral Source: Jennings Books, MD Chief Complaint:   Chief Complaint    Follow-up; Medication Refill     Visit Diagnosis:    ICD-9-CM ICD-10-CM   1. Parkinson's disease ([place]) 332.0 G20   2. GAD (generalized anxiety disorder) 300.02 F41.1     History of Present Illness:    Patient is a 66 year old married male with history of Parkinson's disease presented for follow up.  He was referred by his neurologist Dr. [name]. Patient Is more anxious and apprehensive this morning. He reported that he is having anxiety as he was running late. He was able to control his symptoms while talking. We discussed about the reasons for her anxiety and how he can control them. He reported that he has been compliant with his medications. He reported that he does not have any plans for the weekends. He enjoys singing and going to the church. He sleeps well at night. He does not take any other medications which will make him tired as he go for the exercise on a regular basis. Patient denied having any suicidal ideations or plans. He denied having any perceptual disturbances.    He is also taking Requip to help with his restless legs. He stated that his wife is very supportive.     Associated Signs/Symptoms: Depression Symptoms:  insomnia, fatigue, difficulty concentrating, anxiety, loss of energy/fatigue, disturbed sleep, (Hypo) Manic Symptoms:  Irritable Mood, Anxiety Symptoms:  Excessive Worry, Psychotic Symptoms:  none PTSD Symptoms: Negative NA  [facility]  [facility] He reported that he has seen another psychiatrist in [place] area who suggested that he should get ECT treatment approximately 20 years ago. He does not remember the name of the psychiatrist.  Previous Psychotropic Medications: Paxil Buspar   Substance Abuse History in the  last 12 months:  No.   Does drink 1 coffee per day Diet soda 3 per day No cigarettes   Consequences of Substance Abuse: Negative NA  Past Medical History:  Past Medical History:  Diagnosis Date  . Allergic   . Colon adenoma   . Depression   . Diabetes mellitus, type II ([place])   . Diverticulosis   . Hypertension   . Seizures ([place])   . Sleep apnea   . Urinary hesitancy     Past Surgical History:  Procedure Laterality Date  . COLON SURGERY    . HERNIA REPAIR    . UVULOPALATOPHARYNGOPLASTY, TONSILLECTOMY AND SEPTOPLASTY      Family Psychiatric History:  Brother- Alcoholic Mother- Alcoholic Father- binge drinker    Family History:  Family History  Problem Relation Age of Onset  . Depression Mother   . Cancer - Colon Mother   . Heart attack Father   . Anuerysm Father   . Alcohol abuse Brother   . Diabetes Brother     Social History:   Social History   Social History  . Marital status: Married    Spouse name: N/A  . Number of children: N/A  . Years of education: N/A   Social History Main Topics  . Smoking status: Never Smoker  . Smokeless tobacco: Never Used  . Alcohol use 0.6 oz/week    1 Cans of beer per week     Comment: socially   . Drug use: No  . Sexual activity: Not Currently   Other Topics Concern  . None  Social History Narrative  . None    Additional Social History:  He was raised in a negative family environment. She was harsh and controlling. He had 2 older brothers. He did poorly in school and felt that his mother will clash with his brothers and fathers were alcoholics.  He stated that he had unstable upbringing with various conflicts.  Married since 1984. Just retired in March Worked in [employer].  Has step children.    Allergies:   Allergies  Allergen Reactions  . Sulfa Antibiotics Nausea Only    Metabolic Disorder Labs: No results found for: HGBA1C, MPG No results found for: PROLACTIN No results found for: CHOL, TRIG,  HDL, CHOLHDL, VLDL, LDLCALC   Current Medications: Current Outpatient Prescriptions  Medication Sig Dispense Refill  . aspirin 81 MG chewable tablet Chew by mouth.    Marland Kitchen azelastine (ASTELIN) 0.1 % nasal spray Place into the nose.    . busPIRone (BUSPAR) 15 MG tablet Take 1 tablet (15 mg total) by mouth daily with supper. 30 tablet 1  . carbidopa-levodopa (SINEMET IR) 25-250 MG tablet Take 25-250 tablets by mouth 4 (four) times daily.    . fluticasone (FLONASE) 50 MCG/ACT nasal spray     . glimepiride (AMARYL) 4 MG tablet Take 4 mg by mouth 2 (two) times daily.    Marland Kitchen glucose blood (ONE TOUCH ULTRA TEST) test strip     . ibuprofen (ADVIL,MOTRIN) 200 MG tablet Take 200 mg by mouth every 6 (six) hours as needed.    Marland Kitchen losartan (COZAAR) 50 MG tablet Take 50 mg by mouth.    . lovastatin (MEVACOR) 40 MG tablet Take 40 mg by mouth.    . Melatonin 3 MG TABS Take by mouth.    . metFORMIN (GLUCOPHAGE) 500 MG tablet Take 500 mg by mouth 2 (two) times daily.    . Multiple Vitamin (MULTI-VITAMINS) TABS Take by mouth.    Marland Kitchen rOPINIRole (REQUIP) 2 MG tablet Take 1 tablet (2 mg total) by mouth at bedtime. 30 tablet 1   No current facility-administered medications for this visit.     Neurologic: Headache: No Seizure: No Paresthesias:No  Musculoskeletal: Strength & Muscle Tone: within normal limits Gait & Station: normal Patient leans: N/A  Psychiatric Specialty Exam: Review of Systems  Constitutional: Positive for malaise/fatigue.  Musculoskeletal: Positive for back pain and joint pain.  Neurological: Positive for tremors and weakness.  Psychiatric/Behavioral: The patient is nervous/anxious and has insomnia.   All other systems reviewed and are negative.   Blood pressure (!) 143/90, pulse 98, temperature 97.7 F (36.5 C), temperature source Oral, weight 179 lb 6.4 oz (81.4 kg).Body mass index is 26.49 kg/m.  General Appearance: Casual  Eye Contact:  Good  Speech:  Pressured  Volume:  Normal   Mood:  Anxious  Affect:  Congruent  Thought Process:  Coherent  Orientation:  Full (Time, Place, and Person)  Thought Content:  WDL  Suicidal Thoughts:  No  Homicidal Thoughts:  No  Memory:  Immediate;   Fair Recent;   Fair Remote;   Fair  Judgement:  Impaired  Insight:  Fair  Psychomotor Activity:  Psychomotor Retardation  Concentration:  Concentration: Fair and Attention Span: Fair  Recall:  [employer] of Knowledge:Fair  Language: Fair  Akathisia:  No  Handed:  Right  AIMS (if indicated):    Assets:  Communication Skills Desire for Improvement Physical Health Social Support  ADL's:  Intact  Cognition: WNL  Sleep:  Treatment Plan Summary: Medication management   Continue BuSpar 15 mg daily  Discussed with him about the side effects the medication in detail and he demonstrated understanding. Follow-up in 2  months   More than 50% of the time spent in psychoeducation, counseling and coordination of care.     This note was generated in part or whole with voice recognition software. Voice regonition is usually quite accurate but there are transcription errors that can and very often do occur. I apologize for any typographical errors that were not detected and corrected.    Rainey Pines, MD 1/26/201810:05 AM

## 2016-11-25 DIAGNOSIS — E1142 Type 2 diabetes mellitus with diabetic polyneuropathy: Secondary | ICD-10-CM | POA: Diagnosis not present

## 2016-11-25 DIAGNOSIS — L851 Acquired keratosis [keratoderma] palmaris et plantaris: Secondary | ICD-10-CM | POA: Diagnosis not present

## 2016-11-25 DIAGNOSIS — B351 Tinea unguium: Secondary | ICD-10-CM | POA: Diagnosis not present

## 2016-12-02 ENCOUNTER — Ambulatory Visit (INDEPENDENT_AMBULATORY_CARE_PROVIDER_SITE_OTHER): Payer: PPO | Admitting: Psychiatry

## 2016-12-02 ENCOUNTER — Encounter: Payer: Self-pay | Admitting: Psychiatry

## 2016-12-02 VITALS — BP 149/94 | HR 68 | Temp 97.7°F | Wt 178.2 lb

## 2016-12-02 DIAGNOSIS — G20 Parkinson's disease: Secondary | ICD-10-CM | POA: Diagnosis not present

## 2016-12-02 DIAGNOSIS — F411 Generalized anxiety disorder: Secondary | ICD-10-CM

## 2016-12-02 MED ORDER — BUSPIRONE HCL 15 MG PO TABS: 15.0000 mg | ORAL_TABLET | Freq: Two times a day (BID) | ORAL | 1 refills | Status: AC

## 2016-12-02 NOTE — Progress Notes (Signed)
Psychiatric MD Progress Note  Patient Identification: Randall [name] MRN:  [id number] Date of Evaluation:  12/02/2016 Referral Source: Jennings Books, MD Chief Complaint:    Visit Diagnosis:    ICD-9-CM ICD-10-CM   1. Parkinson's disease ([place]) 332.0 G20   2. GAD (generalized anxiety disorder) 300.02 F41.1     History of Present Illness:    Patient is a 66 year old married male with history of Parkinson's disease presented for follow up.  He was referred by his neurologist Dr. [name]. Patient Is more anxious and Depressed during the interview. He reported that he is having more tremors or earlier this morning. He reported that he feels very negative and was sad during the interview. He was talking about it he has done nothing well in his life. He reported that yesterday he was in the church and then he started crying over there as well. He has negative feelings about himself including feelings of failure since he was 66 years old. He reported that he was not a good student and has never achieved good grades. We discussed about his worsening depression. He reported that he lost his bottle of medication and his wife was yelling at him. He reported that he can understand her frustration but he does not want her to get involved in his depressive symptoms. I discussed with him about suicidal ideations and he reported that he thought about it but he does not have any plan as he is a coward and does not want to kill himself at this time. Patient aborted that he is interested in going higher on the dose of the BuSpar. He denied having any suicidal homicidal ideations or plans. He reported that he is also following up with his Neurologist on a regular basis.   We discussed about his therapy but he reported that he will start exercising on a regular basis.  He will follow up in 1 month as he does not want to come back on a regular appointments due to his copayments .     Associated Signs/Symptoms: Depression  Symptoms:  insomnia, fatigue, difficulty concentrating, anxiety, loss of energy/fatigue, disturbed sleep, (Hypo) Manic Symptoms:  Irritable Mood, Anxiety Symptoms:  Excessive Worry, Psychotic Symptoms:  none PTSD Symptoms: Negative NA  [facility] He reported that he has seen another psychiatrist in [place] area who suggested that he should get ECT treatment approximately 20 years ago. He does not remember the name of the psychiatrist.  Previous Psychotropic Medications: Paxil Buspar   Substance Abuse History in the last 12 months:  No.   Does drink 1 coffee per day Diet soda 3 per day No cigarettes   Consequences of Substance Abuse: Negative NA  Past Medical History:  Past Medical History:  Diagnosis Date  . Allergic   . Colon adenoma   . Depression   . Diabetes mellitus, type II ([place])   . Diverticulosis   . Hypertension   . Seizures ([place])   . Sleep apnea   . Urinary hesitancy     Past Surgical History:  Procedure Laterality Date  . COLON SURGERY    . HERNIA REPAIR    . UVULOPALATOPHARYNGOPLASTY, TONSILLECTOMY AND SEPTOPLASTY      Family Psychiatric History:  Brother- Alcoholic Mother- Alcoholic Father- binge drinker    Family History:  Family History  Problem Relation Age of Onset  . Depression Mother   . Cancer - Colon Mother   . Heart attack Father   .  Anuerysm Father   . Alcohol abuse Brother   . Diabetes Brother     Social History:   Social History   Social History  . Marital status: Married    Spouse name: N/A  . Number of children: N/A  . Years of education: N/A   Social History Main Topics  . Smoking status: Never Smoker  . Smokeless tobacco: Never Used  . Alcohol use 0.6 oz/week    1 Cans of beer per week     Comment: socially   . Drug use: No  . Sexual activity: Not Currently   Other Topics Concern  . Not on file   Social History Narrative  . No narrative on file     Additional Social History:  He was raised in a negative family environment. She was harsh and controlling. He had 2 older brothers. He did poorly in school and felt that his mother will clash with his brothers and fathers were alcoholics.  He stated that he had unstable upbringing with various conflicts.  Married since 1984. Just retired in March Worked in [employer].  Has step children.    Allergies:   Allergies  Allergen Reactions  . Sulfa Antibiotics Nausea Only    Metabolic Disorder Labs: No results found for: HGBA1C, MPG No results found for: PROLACTIN No results found for: CHOL, TRIG, HDL, CHOLHDL, VLDL, LDLCALC   Current Medications: Current Outpatient Prescriptions  Medication Sig Dispense Refill  . aspirin 81 MG chewable tablet Chew by mouth.    Marland Kitchen azelastine (ASTELIN) 0.1 % nasal spray Place into the nose.    . busPIRone (BUSPAR) 15 MG tablet Take 1 tablet (15 mg total) by mouth daily with supper. 30 tablet 1  . carbidopa-levodopa (SINEMET IR) 25-250 MG tablet Take 25-250 tablets by mouth 4 (four) times daily.    . fluticasone (FLONASE) 50 MCG/ACT nasal spray     . glimepiride (AMARYL) 4 MG tablet Take 4 mg by mouth 2 (two) times daily.    Marland Kitchen glucose blood (ONE TOUCH ULTRA TEST) test strip     . ibuprofen (ADVIL,MOTRIN) 200 MG tablet Take 200 mg by mouth every 6 (six) hours as needed.    Marland Kitchen losartan (COZAAR) 50 MG tablet Take 50 mg by mouth.    . lovastatin (MEVACOR) 40 MG tablet Take 40 mg by mouth.    . Melatonin 3 MG TABS Take by mouth.    . metFORMIN (GLUCOPHAGE) 500 MG tablet Take 500 mg by mouth 2 (two) times daily.    . Multiple Vitamin (MULTI-VITAMINS) TABS Take by mouth.    Marland Kitchen rOPINIRole (REQUIP) 2 MG tablet Take 1 tablet (2 mg total) by mouth at bedtime. 30 tablet 1   No current facility-administered medications for this visit.     Neurologic: Headache: No Seizure: No Paresthesias:No  Musculoskeletal: Strength & Muscle Tone: within normal  limits Gait & Station: normal Patient leans: N/A  Psychiatric Specialty Exam: Review of Systems  Constitutional: Positive for malaise/fatigue.  Musculoskeletal: Positive for back pain and joint pain.  Neurological: Positive for tremors and weakness.  Psychiatric/Behavioral: The patient is nervous/anxious and has insomnia.   All other systems reviewed and are negative.   There were no vitals taken for this visit.There is no height or weight on file to calculate BMI.  General Appearance: Casual  Eye Contact:  Good  Speech:  Pressured  Volume:  Normal  Mood:  Anxious  Affect:  Congruent  Thought Process:  Coherent  Orientation:  Full (  Time, Place, and Person)  Thought Content:  WDL  Suicidal Thoughts:  No  Homicidal Thoughts:  No  Memory:  Immediate;   Fair Recent;   Fair Remote;   Fair  Judgement:  Impaired  Insight:  Fair  Psychomotor Activity:  Psychomotor Retardation  Concentration:  Concentration: Fair and Attention Span: Fair  Recall:  [employer] of Knowledge:Fair  Language: Fair  Akathisia:  No  Handed:  Right  AIMS (if indicated):    Assets:  Communication Skills Desire for Improvement Physical Health Social Support  ADL's:  Intact  Cognition: WNL  Sleep:      Treatment Plan Summary: Medication management   Will titrate the dose of BuSpar 15 mg by mouth twice a day. I had a long discussion with the patient about his depression and his worsening anxiety. Advised patient to come back for an earlier appointment if she notices an of his symptoms and he demonstrated understanding.  Discussed with him about the side effects the medication in detail and he demonstrated understanding. Follow-up in 1  months   More than 50% of the time spent in psychoeducation, counseling and coordination of care.     This note was generated in part or whole with voice recognition software. Voice regonition is usually quite accurate but there are transcription errors that can and  very often do occur. I apologize for any typographical errors that were not detected and corrected.    Rainey Pines, MD 3/26/20189:16 AM

## 2016-12-16 DIAGNOSIS — H2513 Age-related nuclear cataract, bilateral: Secondary | ICD-10-CM | POA: Diagnosis not present

## 2017-01-03 DIAGNOSIS — Z791 Long term (current) use of non-steroidal anti-inflammatories (NSAID): Secondary | ICD-10-CM | POA: Diagnosis not present

## 2017-01-03 DIAGNOSIS — I10 Essential (primary) hypertension: Secondary | ICD-10-CM | POA: Diagnosis not present

## 2017-01-03 DIAGNOSIS — E118 Type 2 diabetes mellitus with unspecified complications: Secondary | ICD-10-CM | POA: Diagnosis not present

## 2017-01-03 DIAGNOSIS — M5136 Other intervertebral disc degeneration, lumbar region: Secondary | ICD-10-CM | POA: Diagnosis not present

## 2017-01-13 DIAGNOSIS — R809 Proteinuria, unspecified: Secondary | ICD-10-CM | POA: Diagnosis not present

## 2017-01-13 DIAGNOSIS — G4710 Hypersomnia, unspecified: Secondary | ICD-10-CM | POA: Diagnosis not present

## 2017-01-13 DIAGNOSIS — Z125 Encounter for screening for malignant neoplasm of prostate: Secondary | ICD-10-CM | POA: Diagnosis not present

## 2017-01-13 DIAGNOSIS — I10 Essential (primary) hypertension: Secondary | ICD-10-CM | POA: Diagnosis not present

## 2017-01-13 DIAGNOSIS — F3342 Major depressive disorder, recurrent, in full remission: Secondary | ICD-10-CM | POA: Diagnosis not present

## 2017-01-13 DIAGNOSIS — G4730 Sleep apnea, unspecified: Secondary | ICD-10-CM | POA: Diagnosis not present

## 2017-01-13 DIAGNOSIS — M5136 Other intervertebral disc degeneration, lumbar region: Secondary | ICD-10-CM | POA: Diagnosis not present

## 2017-01-13 DIAGNOSIS — E118 Type 2 diabetes mellitus with unspecified complications: Secondary | ICD-10-CM | POA: Diagnosis not present

## 2017-01-13 DIAGNOSIS — E7800 Pure hypercholesterolemia, unspecified: Secondary | ICD-10-CM | POA: Diagnosis not present

## 2017-01-13 DIAGNOSIS — G20 Parkinson's disease: Secondary | ICD-10-CM | POA: Diagnosis not present

## 2017-01-27 DIAGNOSIS — R4189 Other symptoms and signs involving cognitive functions and awareness: Secondary | ICD-10-CM | POA: Diagnosis not present

## 2017-01-27 DIAGNOSIS — G20 Parkinson's disease: Secondary | ICD-10-CM | POA: Diagnosis not present

## 2017-01-29 ENCOUNTER — Ambulatory Visit: Payer: PPO | Admitting: Psychiatry

## 2017-02-25 DIAGNOSIS — B351 Tinea unguium: Secondary | ICD-10-CM | POA: Diagnosis not present

## 2017-02-25 DIAGNOSIS — L851 Acquired keratosis [keratoderma] palmaris et plantaris: Secondary | ICD-10-CM | POA: Diagnosis not present

## 2017-02-25 DIAGNOSIS — E1142 Type 2 diabetes mellitus with diabetic polyneuropathy: Secondary | ICD-10-CM | POA: Diagnosis not present

## 2017-06-02 DIAGNOSIS — B351 Tinea unguium: Secondary | ICD-10-CM | POA: Diagnosis not present

## 2017-06-02 DIAGNOSIS — L851 Acquired keratosis [keratoderma] palmaris et plantaris: Secondary | ICD-10-CM | POA: Diagnosis not present

## 2017-06-02 DIAGNOSIS — E1142 Type 2 diabetes mellitus with diabetic polyneuropathy: Secondary | ICD-10-CM | POA: Diagnosis not present

## 2017-07-11 DIAGNOSIS — I10 Essential (primary) hypertension: Secondary | ICD-10-CM | POA: Diagnosis not present

## 2017-07-11 DIAGNOSIS — G4710 Hypersomnia, unspecified: Secondary | ICD-10-CM | POA: Diagnosis not present

## 2017-07-11 DIAGNOSIS — G4730 Sleep apnea, unspecified: Secondary | ICD-10-CM | POA: Diagnosis not present

## 2017-07-11 DIAGNOSIS — M5136 Other intervertebral disc degeneration, lumbar region: Secondary | ICD-10-CM | POA: Diagnosis not present

## 2017-07-11 DIAGNOSIS — Z125 Encounter for screening for malignant neoplasm of prostate: Secondary | ICD-10-CM | POA: Diagnosis not present

## 2017-07-11 DIAGNOSIS — E118 Type 2 diabetes mellitus with unspecified complications: Secondary | ICD-10-CM | POA: Diagnosis not present

## 2017-07-18 DIAGNOSIS — E7800 Pure hypercholesterolemia, unspecified: Secondary | ICD-10-CM | POA: Diagnosis not present

## 2017-07-18 DIAGNOSIS — G4730 Sleep apnea, unspecified: Secondary | ICD-10-CM | POA: Diagnosis not present

## 2017-07-18 DIAGNOSIS — E1169 Type 2 diabetes mellitus with other specified complication: Secondary | ICD-10-CM | POA: Diagnosis not present

## 2017-07-18 DIAGNOSIS — Z23 Encounter for immunization: Secondary | ICD-10-CM | POA: Diagnosis not present

## 2017-07-18 DIAGNOSIS — G20 Parkinson's disease: Secondary | ICD-10-CM | POA: Diagnosis not present

## 2017-07-18 DIAGNOSIS — R809 Proteinuria, unspecified: Secondary | ICD-10-CM | POA: Diagnosis not present

## 2017-07-18 DIAGNOSIS — G4710 Hypersomnia, unspecified: Secondary | ICD-10-CM | POA: Diagnosis not present

## 2017-07-18 DIAGNOSIS — B351 Tinea unguium: Secondary | ICD-10-CM | POA: Diagnosis not present

## 2017-07-18 DIAGNOSIS — E118 Type 2 diabetes mellitus with unspecified complications: Secondary | ICD-10-CM | POA: Diagnosis not present

## 2017-07-18 DIAGNOSIS — Z0000 Encounter for general adult medical examination without abnormal findings: Secondary | ICD-10-CM | POA: Diagnosis not present

## 2017-07-18 DIAGNOSIS — F3340 Major depressive disorder, recurrent, in remission, unspecified: Secondary | ICD-10-CM | POA: Diagnosis not present

## 2017-07-18 DIAGNOSIS — I10 Essential (primary) hypertension: Secondary | ICD-10-CM | POA: Diagnosis not present

## 2017-07-18 DIAGNOSIS — M5136 Other intervertebral disc degeneration, lumbar region: Secondary | ICD-10-CM | POA: Diagnosis not present

## 2017-07-28 DIAGNOSIS — R4189 Other symptoms and signs involving cognitive functions and awareness: Secondary | ICD-10-CM | POA: Diagnosis not present

## 2017-07-28 DIAGNOSIS — G20 Parkinson's disease: Secondary | ICD-10-CM | POA: Diagnosis not present

## 2017-09-16 DIAGNOSIS — B351 Tinea unguium: Secondary | ICD-10-CM | POA: Diagnosis not present

## 2017-09-16 DIAGNOSIS — E1142 Type 2 diabetes mellitus with diabetic polyneuropathy: Secondary | ICD-10-CM | POA: Diagnosis not present

## 2017-09-16 DIAGNOSIS — L851 Acquired keratosis [keratoderma] palmaris et plantaris: Secondary | ICD-10-CM | POA: Diagnosis not present

## 2017-10-13 ENCOUNTER — Encounter: Payer: Self-pay | Admitting: Occupational Therapy

## 2017-10-13 ENCOUNTER — Ambulatory Visit: Payer: PPO | Attending: Neurology | Admitting: Occupational Therapy

## 2017-10-13 ENCOUNTER — Other Ambulatory Visit: Payer: Self-pay

## 2017-10-13 ENCOUNTER — Ambulatory Visit: Payer: PPO | Admitting: Speech Pathology

## 2017-10-13 ENCOUNTER — Encounter: Payer: Self-pay | Admitting: Speech Pathology

## 2017-10-13 DIAGNOSIS — R278 Other lack of coordination: Secondary | ICD-10-CM | POA: Diagnosis not present

## 2017-10-13 DIAGNOSIS — R2681 Unsteadiness on feet: Secondary | ICD-10-CM | POA: Diagnosis not present

## 2017-10-13 DIAGNOSIS — R490 Dysphonia: Secondary | ICD-10-CM | POA: Insufficient documentation

## 2017-10-13 DIAGNOSIS — R262 Difficulty in walking, not elsewhere classified: Secondary | ICD-10-CM

## 2017-10-13 DIAGNOSIS — M6281 Muscle weakness (generalized): Secondary | ICD-10-CM

## 2017-10-13 NOTE — Therapy (Signed)
[place] MAIN [facility] SERVICES [address] [place], [place], [address] Phone: [phone]   Fax:  [phone]  Speech Language Pathology Evaluation  Patient Details  Name: Randall [name] MRN: [id number] Date of Birth: 03/06/1951 Referring Provider: Dr. [name]   Encounter Date: 10/13/2017  End of Session - 10/13/17 1537    Visit Number  1    Number of Visits  17    Date for SLP Re-Evaluation  11/14/17    SLP Start Time  53    SLP Stop Time   1450    SLP Time Calculation (min)  50 min    Activity Tolerance  Patient tolerated treatment well       Past Medical History:  Diagnosis Date   Allergic    Colon adenoma    Depression    Diabetes mellitus, type II ([place])    Diverticulosis    Hypertension    Seizures ([place])    Sleep apnea    Urinary hesitancy     Past Surgical History:  Procedure Laterality Date   COLON SURGERY     HERNIA REPAIR     UVULOPALATOPHARYNGOPLASTY, TONSILLECTOMY AND SEPTOPLASTY      There were no vitals filed for this visit.      SLP Evaluation [facility] - 10/13/17 1531      SLP Visit Information   SLP Received On  10/13/17    Referring Provider  Dr. [name]    Onset Date  08/06/2017    Medical Diagnosis  Parkinson's disease      Subjective   Subjective  "People can't understand me"    Patient/Family Stated Goal  Clear, audible speech      Pain Assessment   Currently in Pain?  No/denies      General Information   HPI  67 year old man diagnosed with Parkinson's disease in 2016.  Patient states that he went through the LSVT LOUD and BIG in 2016.      Prior Functional Status   Cognitive/Linguistic Baseline  Within functional limits      Oral Motor/Sensory Function   Overall Oral Motor/Sensory Function  Appears within functional limits for tasks assessed      Motor Speech   Overall Motor Speech  Impaired    Respiration  Impaired    Level of Impairment  Conversation    Phonation   Breathy;Hoarse;Low vocal intensity    Resonance  Within functional limits    Articulation  Impaired    Level of Impairment  Conversation    Intelligibility  Intelligibility reduced    Conversation  75-100% accurate    Phonation  Impaired    Vocal Abuses  Vocal Fold Dehydration    Tension Present  Jaw;Neck;Shoulder    Volume  Soft    Pitch  Appropriate      Standardized Assessments   Standardized Assessments   Other Assessment LSVT-LOUD Voice Evaluation        LSVT-LOUD Voice Evaluation Maximum phonation time for sustained ah: 18 seconds Mean intensity during sustained ah: 70 dB  Mean intensity sustained during conversational speech: 70 dB Highest dynamic pitch when altering pitch from a low note to a high note: 213 Hz Highest pitch during conversational speech: 170 Hz Lowest dynamic pitch when altering from a high note to a low note: 116 Hz Lowest pitch during conversational speech: 102 Hz  Average fundamental frequency during sustained ah: 145 Hz (within 1 STD for age and gender) Visi-Pitch: Surveyor, minerals Program (  MDVP)  MDVP extracts objective quantitative values (Relative Average Perturbation, Shimmer, Voice Turbulence Index, and Noise to Harmonic Ratio) on sustained phonation, which are displayed graphically and numerically in comparison to a built-in normative database.  The patient exhibited values within the norm for Relative Average Perturbation, Shimmer, Voice Turbulence Index, and Noise to Harmonic Ratio.  Average fundamental frequency was average for age and gender. Perceptually, his vocal quality was muffled.  The patient improved all parameters when cued to alter voicing (loud like me).  Simulatability: Improved vocal quality with loud voice.   SLP Education - 10/13/17 1537    Education provided  Yes    Education Details  Goals of LSVT-LOUD    Person(s) Educated  Patient    Methods  Explanation    Comprehension  Verbalized understanding          SLP Long Term Goals - 10/13/17 1539      SLP LONG TERM GOAL #1   Title  The patient will complete Daily Tasks (Maximum duration "ah", High/Lows, and Functional Phrases) at average loudness of 80 dB and with loud, good quality voice.     Time  4    Period  Weeks    Status  New    Target Date  11/14/17      SLP LONG TERM GOAL #2   Title  The patient will complete Hierarchal Speech Loudness reading drills (words/phrases, sentences, and paragraph) at average 75 dB and with loud, good quality voice.      Time  4    Period  Weeks    Status  New    Target Date  11/14/17      SLP LONG TERM GOAL #3   Title  The patient will participate in conversation, maintaining average loudness of 75 dB and loud, good quality voice.    Time  4    Period  Weeks    Status  New    Target Date  11/14/17      SLP LONG TERM GOAL #4   Title  The patient will complete homework daily.    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  Based on stimulability testing, the patient is judged to be a good candidate for the LSVT LOUD program.  It is recommended that the patient receive the LSVT LOUD program which is comprised of 16 intensive sessions (4 times per week for 4 weeks, one hour sessions).  Prognosis for improvement is good based on motivation, stimulability, and strong family support.  LSVT LOUD has been documented in the literature as efficacious for individuals with Parkinson's disease.      Speech Therapy Frequency  4x / week    Duration  4 weeks    Treatment/Interventions  Patient/family education;Other (comment) LSVT-LOUD    Potential to Achieve Goals  Good    Potential Considerations  Ability to learn/carryover information;Severity of  impairments;Co-morbidities;Cooperation/participation level;Family/community support;Medical prognosis;Previous level of function    SLP Home Exercise Plan  LSVT-LOUD daily homework    Consulted and Agree with Plan of Care  Patient       Patient will benefit from skilled therapeutic intervention in order to improve the following  deficits and impairments:   Dysphonia - Plan: SLP plan of care cert/re-cert    Problem List Patient Active Problem List   Diagnosis Date Noted   Adenoma of large intestine 02/01/2016   Clinical depression 02/01/2016   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   Benign essential HTN 02/01/2016   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/2016   Pure hypercholesterolemia 02/01/2016   Type 2 diabetes mellitus ([place]) 01/10/2016   Microalbuminuria 01/10/2016   Degeneration of intervertebral disc of lumbar region 07/13/2015   Parkinson's disease ([place]) 11/16/2012   [name], MS/CCC- SLP  [name] 10/13/2017, 3:43 PM  [place] MAIN [facility] SERVICES [address] [place], [place], [address] Phone: [phone]   Fax:  [phone]  Name: Randall [name] MRN: [id number] Date of Birth: September 30, 1950

## 2017-10-20 ENCOUNTER — Ambulatory Visit: Payer: PPO | Admitting: Occupational Therapy

## 2017-10-20 ENCOUNTER — Ambulatory Visit: Payer: PPO | Admitting: Speech Pathology

## 2017-10-20 ENCOUNTER — Encounter: Payer: Self-pay | Admitting: Speech Pathology

## 2017-10-20 DIAGNOSIS — R490 Dysphonia: Secondary | ICD-10-CM

## 2017-10-20 DIAGNOSIS — R2681 Unsteadiness on feet: Secondary | ICD-10-CM

## 2017-10-20 DIAGNOSIS — M6281 Muscle weakness (generalized): Secondary | ICD-10-CM

## 2017-10-20 DIAGNOSIS — R278 Other lack of coordination: Secondary | ICD-10-CM

## 2017-10-20 DIAGNOSIS — R262 Difficulty in walking, not elsewhere classified: Secondary | ICD-10-CM | POA: Diagnosis not present

## 2017-10-20 NOTE — Therapy (Signed)
[place] MAIN [facility] SERVICES [address] [place], [place], [address] Phone: [phone]   Fax:  [phone]  Speech Language Pathology Treatment  Patient Details  Name: Randall [name] MRN: [id number] Date of Birth: Feb 01, 1951 Referring Provider: Dr. [name]   Encounter Date: 10/20/2017  End of Session - 10/20/17 1459    Visit Number  2    Number of Visits  17    Date for SLP Re-Evaluation  11/14/17    SLP Start Time  21    SLP Stop Time   1450    SLP Time Calculation (min)  50 min    Activity Tolerance  Patient tolerated treatment well       Past Medical History:  Diagnosis Date  . Allergic   . Colon adenoma   . Depression   . Diabetes mellitus, type II ([place])   . Diverticulosis   . Hypertension   . Seizures ([place])   . Sleep apnea   . Urinary hesitancy     Past Surgical History:  Procedure Laterality Date  . COLON SURGERY    . HERNIA REPAIR    . UVULOPALATOPHARYNGOPLASTY, TONSILLECTOMY AND SEPTOPLASTY      There were no vitals filed for this visit.  Subjective Assessment - 10/20/17 1459    Subjective  "That's loud!"            ADULT SLP TREATMENT - 10/20/17 0001      General Information   Behavior/Cognition  Alert;Cooperative;Pleasant mood    HPI   67 year old man diagnosed with Parkinson's disease in 2016.  Patient states that he went through the LSVT LOUD and BIG in 2016.       Treatment Provided   Treatment provided  Cognitive-Linquistic      Pain Assessment   Pain Assessment  No/denies pain      Cognitive-Linquistic Treatment   Treatment focused on  Voice    Skilled Treatment  Daily Task #1 (Maximum sustained "ah"): Average 12 seconds, 80 dB. Daily Task 2 (Maximum fundamental frequency range): Highs: 15 high pitched "ah" given min-mod cues. Lows: 15 low pitched "ah" given min-mod cues. Daily task #3 (Maximum speech loudness drill of functional phrases): Average 73 dB.  Hierarchal speech loudness drill: Read  complex sentences, 70 dB.  Generate short responses, 70 dB.  Homework: assignments given.  Off the cuff remarks: average 65 dB.      Assessment / Recommendations / Plan   Plan  Continue with current plan of care      Progression Toward Goals   Progression toward goals  Progressing toward goals       SLP Education - 10/20/17 1459    Education provided  Yes    Education Details  LSVT-LOUD daily homework    Person(s) Educated  Patient    Methods  Explanation    Comprehension  Verbalized understanding         SLP Long Term Goals - 10/13/17 1539      SLP LONG TERM GOAL #1   Title  The patient will complete Daily Tasks (Maximum duration "ah", High/Lows, and Functional Phrases) at average loudness of 80 dB and with loud, good quality voice.     Time  4    Period  Weeks    Status  New    Target Date  11/14/17      SLP LONG TERM GOAL #2   Title  The patient will complete Hierarchal Speech Loudness reading drills (  words/phrases, sentences, and paragraph) at average 75 dB and with loud, good quality voice.      Time  4    Period  Weeks    Status  New    Target Date  11/14/17      SLP LONG TERM GOAL #3   Title  The patient will participate in conversation, maintaining average loudness of 75 dB and loud, good quality voice.    Time  4    Period  Weeks    Status  New    Target Date  11/14/17      SLP LONG TERM GOAL #4   Title  The patient will complete homework daily.    Time  4    Period  Weeks    Status  New    Target Date  11/14/17       Plan - 10/20/17 1500    Clinical Impression Statement  The patient is completing daily tasks and hierarchal speech drill tasks with loud, good quality voice given SLP cues.      Speech Therapy Frequency  4x / week    Duration  4 weeks    Treatment/Interventions  Patient/family education;Other (comment) LSVT-LOUD    Potential to Achieve Goals  Good    Potential Considerations  Ability to learn/carryover information;Severity of  impairments;Co-morbidities;Cooperation/participation level;Family/community support;Medical prognosis;Previous level of function    SLP Home Exercise Plan  LSVT-LOUD daily homework    Consulted and Agree with Plan of Care  Patient       Patient will benefit from skilled therapeutic intervention in order to improve the following deficits and impairments:   Dysphonia    Problem List Patient Active Problem List   Diagnosis Date Noted  . Adenoma of large intestine 02/01/2016  . Clinical depression 02/01/2016  . 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 Benign essential HTN 02/01/2016  . 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/2016  . Pure hypercholesterolemia 02/01/2016  . Type 2 diabetes mellitus ([place]) 01/10/2016  . Microalbuminuria 01/10/2016  . Degeneration of intervertebral disc of lumbar region 07/13/2015  . Parkinson's disease ([place]) 11/16/2012   Randall Sea, MS/CCC- SLP  Randall [name] 10/20/2017, 3:01 PM  [place] MAIN [facility] SERVICES [address] [place], [place], [address] Phone: [phone]   Fax:  [phone]   Name: Randall [name] MRN: [id number] Date of Birth: 1950/10/31

## 2017-10-21 ENCOUNTER — Encounter: Payer: Self-pay | Admitting: Speech Pathology

## 2017-10-21 ENCOUNTER — Ambulatory Visit: Payer: PPO | Admitting: Speech Pathology

## 2017-10-21 ENCOUNTER — Ambulatory Visit: Payer: PPO | Admitting: Occupational Therapy

## 2017-10-21 DIAGNOSIS — M6281 Muscle weakness (generalized): Secondary | ICD-10-CM

## 2017-10-21 DIAGNOSIS — R262 Difficulty in walking, not elsewhere classified: Secondary | ICD-10-CM | POA: Diagnosis not present

## 2017-10-21 DIAGNOSIS — R2681 Unsteadiness on feet: Secondary | ICD-10-CM

## 2017-10-21 DIAGNOSIS — R278 Other lack of coordination: Secondary | ICD-10-CM

## 2017-10-21 DIAGNOSIS — R490 Dysphonia: Secondary | ICD-10-CM

## 2017-10-21 NOTE — Therapy (Signed)
[place] MAIN [facility] SERVICES [address] [place], [place], [address] Phone: [phone]   Fax:  [phone]  Speech Language Pathology Treatment  Patient Details  Name: Randall [name] MRN: [id number] Date of Birth: April 01, 1951 Referring Provider: Dr. [name]   Encounter Date: 10/21/2017  End of Session - 10/21/17 1541    Visit Number  3    Number of Visits  17    Date for SLP Re-Evaluation  11/14/17    SLP Start Time  1430    SLP Stop Time   1526    SLP Time Calculation (min)  56 min    Activity Tolerance  Patient tolerated treatment well       Past Medical History:  Diagnosis Date  . Allergic   . Colon adenoma   . Depression   . Diabetes mellitus, type II ([place])   . Diverticulosis   . Hypertension   . Seizures ([place])   . Sleep apnea   . Urinary hesitancy     Past Surgical History:  Procedure Laterality Date  . COLON SURGERY    . HERNIA REPAIR    . UVULOPALATOPHARYNGOPLASTY, TONSILLECTOMY AND SEPTOPLASTY      There were no vitals filed for this visit.  Subjective Assessment - 10/21/17 1541    Subjective  "I'm trying!"            ADULT SLP TREATMENT - 10/21/17 0001      General Information   Behavior/Cognition  Alert;Cooperative;Pleasant mood    HPI   67 year old man diagnosed with Parkinson's disease in 2016.  Patient states that he went through the LSVT LOUD and BIG in 2016.       Treatment Provided   Treatment provided  Cognitive-Linquistic      Pain Assessment   Pain Assessment  No/denies pain      Cognitive-Linquistic Treatment   Treatment focused on  Voice    Skilled Treatment  Daily Task #1 (Maximum sustained "ah"): Average 12 seconds, 80 dB. Daily Task 2 (Maximum fundamental frequency range): Highs: 15 high pitched "ah" given min-mod cues. Lows: 15 low pitched "ah" given min-mod cues. Daily task #3 (Maximum speech loudness drill of functional phrases): Average 73 dB.  Hierarchal speech loudness drill: Read  complex sentences, 70 dB.  Generate short responses, 70 dB.  Homework: assignments given.  Off the cuff remarks: average 65 dB.      Assessment / Recommendations / Plan   Plan  Continue with current plan of care      Progression Toward Goals   Progression toward goals  Progressing toward goals       SLP Education - 10/21/17 1541    Education provided  Yes    Education Details  LSVT-LOUD    Person(s) Educated  Patient    Methods  Explanation    Comprehension  Verbalized understanding         SLP Long Term Goals - 10/13/17 1539      SLP LONG TERM GOAL #1   Title  The patient will complete Daily Tasks (Maximum duration "ah", High/Lows, and Functional Phrases) at average loudness of 80 dB and with loud, good quality voice.     Time  4    Period  Weeks    Status  New    Target Date  11/14/17      SLP LONG TERM GOAL #2   Title  The patient will complete Hierarchal Speech Loudness reading drills (words/phrases, sentences,  and paragraph) at average 75 dB and with loud, good quality voice.      Time  4    Period  Weeks    Status  New    Target Date  11/14/17      SLP LONG TERM GOAL #3   Title  The patient will participate in conversation, maintaining average loudness of 75 dB and loud, good quality voice.    Time  4    Period  Weeks    Status  New    Target Date  11/14/17      SLP LONG TERM GOAL #4   Title  The patient will complete homework daily.    Time  4    Period  Weeks    Status  New    Target Date  11/14/17       Plan - 10/21/17 1542    Clinical Impression Statement  The patient is completing daily tasks and hierarchal speech drill tasks with loud, good quality voice given SLP cues.      Speech Therapy Frequency  4x / week    Duration  4 weeks    Treatment/Interventions  Patient/family education;Other (comment) LSVT-LOUD    Potential to Achieve Goals  Good    Potential Considerations  Ability to learn/carryover information;Severity of  impairments;Co-morbidities;Cooperation/participation level;Family/community support;Medical prognosis;Previous level of function    SLP Home Exercise Plan  LSVT-LOUD daily homework    Consulted and Agree with Plan of Care  Patient       Patient will benefit from skilled therapeutic intervention in order to improve the following deficits and impairments:   Dysphonia    Problem List Patient Active Problem List   Diagnosis Date Noted  . Adenoma of large intestine 02/01/2016  . Clinical depression 02/01/2016  . 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 Benign essential HTN 02/01/2016  . 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/2016  . Pure hypercholesterolemia 02/01/2016  . Type 2 diabetes mellitus ([place]) 01/10/2016  . Microalbuminuria 01/10/2016  . Degeneration of intervertebral disc of lumbar region 07/13/2015  . Parkinson's disease ([place]) 11/16/2012   [name], MS/CCC- SLP  [name] 10/21/2017, 3:43 PM  [place] MAIN [facility] SERVICES [address] [place], [place], [address] Phone: [phone]   Fax:  [phone]   Name: Randall [name] MRN: [id number] Date of Birth: 1950/09/22

## 2017-10-22 ENCOUNTER — Encounter: Payer: Self-pay | Admitting: Speech Pathology

## 2017-10-22 ENCOUNTER — Ambulatory Visit: Payer: PPO | Admitting: Occupational Therapy

## 2017-10-22 ENCOUNTER — Ambulatory Visit: Payer: PPO | Admitting: Speech Pathology

## 2017-10-22 DIAGNOSIS — R2681 Unsteadiness on feet: Secondary | ICD-10-CM

## 2017-10-22 DIAGNOSIS — R262 Difficulty in walking, not elsewhere classified: Secondary | ICD-10-CM

## 2017-10-22 DIAGNOSIS — R490 Dysphonia: Secondary | ICD-10-CM

## 2017-10-22 DIAGNOSIS — R278 Other lack of coordination: Secondary | ICD-10-CM

## 2017-10-22 DIAGNOSIS — M6281 Muscle weakness (generalized): Secondary | ICD-10-CM

## 2017-10-22 NOTE — Therapy (Signed)
[place] MAIN [facility] SERVICES [address] [place], [place], [address] Phone: [phone]   Fax:  [phone]  Speech Language Pathology Treatment  Patient Details  Name: Randall [name] MRN: [id number] Date of Birth: 03-26-1951 Referring Provider: Dr. [name]   Encounter Date: 10/22/2017  End of Session - 10/22/17 1547    Visit Number  4    Number of Visits  17    Date for SLP Re-Evaluation  11/14/17    SLP Start Time  1400    SLP Stop Time   1454    SLP Time Calculation (min)  54 min    Activity Tolerance  Patient tolerated treatment well       Past Medical History:  Diagnosis Date  . Allergic   . Colon adenoma   . Depression   . Diabetes mellitus, type II ([place])   . Diverticulosis   . Hypertension   . Seizures ([place])   . Sleep apnea   . Urinary hesitancy     Past Surgical History:  Procedure Laterality Date  . COLON SURGERY    . HERNIA REPAIR    . UVULOPALATOPHARYNGOPLASTY, TONSILLECTOMY AND SEPTOPLASTY      There were no vitals filed for this visit.  Subjective Assessment - 10/22/17 1547    Subjective  "I'm trying!"            ADULT SLP TREATMENT - 10/22/17 0001      General Information   Behavior/Cognition  Alert;Cooperative;Pleasant mood    HPI   67 year old man diagnosed with Parkinson's disease in 2016.  Patient states that he went through the LSVT LOUD and BIG in 2016.       Treatment Provided   Treatment provided  Cognitive-Linquistic      Pain Assessment   Pain Assessment  No/denies pain      Cognitive-Linquistic Treatment   Treatment focused on  Voice    Skilled Treatment  Daily Task #1 (Maximum sustained "ah"): Average 12 seconds, 84 dB. Daily Task 2 (Maximum fundamental frequency range): Highs: 15 high pitched "ah" given min cues. Lows: 15 low pitched "ah" given min cues. Daily task #3 (Maximum speech loudness drill of functional phrases): Average 75 dB.  Hierarchal speech loudness drill: Read complex  sentences, 73 dB.  Generate short responses, 72 dB.  Homework: assignments given.  Off the cuff remarks: average 65 dB, improves to 73 dB with SLP cues.      Assessment / Recommendations / Plan   Plan  Continue with current plan of care      Progression Toward Goals   Progression toward goals  Progressing toward goals       SLP Education - 10/22/17 1547    Education provided  Yes    Education Details  LSVT-LOUD    Person(s) Educated  Patient    Methods  Explanation    Comprehension  Verbalized understanding         SLP Long Term Goals - 10/13/17 1539      SLP LONG TERM GOAL #1   Title  The patient will complete Daily Tasks (Maximum duration "ah", High/Lows, and Functional Phrases) at average loudness of 80 dB and with loud, good quality voice.     Time  4    Period  Weeks    Status  New    Target Date  11/14/17      SLP LONG TERM GOAL #2   Title  The patient will complete  Hierarchal Speech Loudness reading drills (words/phrases, sentences, and paragraph) at average 75 dB and with loud, good quality voice.      Time  4    Period  Weeks    Status  New    Target Date  11/14/17      SLP LONG TERM GOAL #3   Title  The patient will participate in conversation, maintaining average loudness of 75 dB and loud, good quality voice.    Time  4    Period  Weeks    Status  New    Target Date  11/14/17      SLP LONG TERM GOAL #4   Title  The patient will complete homework daily.    Time  4    Period  Weeks    Status  New    Target Date  11/14/17       Plan - 10/22/17 1548    Clinical Impression Statement  The patient is completing daily tasks and hierarchal speech drill tasks with loud, good quality voice given fewer SLP cues.      Speech Therapy Frequency  4x / week    Duration  4 weeks    Treatment/Interventions  Patient/family education;Other (comment) LSVT-LOUD    Potential to Achieve Goals  Good    Potential Considerations  Ability to learn/carryover  information;Severity of impairments;Co-morbidities;Cooperation/participation level;Family/community support;Medical prognosis;Previous level of function    SLP Home Exercise Plan  LSVT-LOUD daily homework    Consulted and Agree with Plan of Care  Patient       Patient will benefit from skilled therapeutic intervention in order to improve the following deficits and impairments:   Dysphonia    Problem List Patient Active Problem List   Diagnosis Date Noted  . Adenoma of large intestine 02/01/2016  . Clinical depression 02/01/2016  . 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 Benign essential HTN 02/01/2016  . 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/2016  . Pure hypercholesterolemia 02/01/2016  . Type 2 diabetes mellitus ([place]) 01/10/2016  . Microalbuminuria 01/10/2016  . Degeneration of intervertebral disc of lumbar region 07/13/2015  . Parkinson's disease ([place]) 11/16/2012   Randall Sea, MS/CCC- SLP  [name] 10/22/2017, 3:49 PM  [place] MAIN [facility] SERVICES [address] [place], [place], [address] Phone: [phone]   Fax:  [phone]   Name: Randall [name] MRN: [id number] Date of Birth: 06/06/1951

## 2017-10-23 ENCOUNTER — Ambulatory Visit: Payer: PPO | Admitting: Occupational Therapy

## 2017-10-23 ENCOUNTER — Ambulatory Visit: Payer: PPO | Admitting: Speech Pathology

## 2017-10-23 ENCOUNTER — Encounter: Payer: Self-pay | Admitting: Speech Pathology

## 2017-10-23 DIAGNOSIS — R262 Difficulty in walking, not elsewhere classified: Secondary | ICD-10-CM | POA: Diagnosis not present

## 2017-10-23 DIAGNOSIS — R278 Other lack of coordination: Secondary | ICD-10-CM

## 2017-10-23 DIAGNOSIS — R2681 Unsteadiness on feet: Secondary | ICD-10-CM

## 2017-10-23 DIAGNOSIS — R490 Dysphonia: Secondary | ICD-10-CM

## 2017-10-23 DIAGNOSIS — M6281 Muscle weakness (generalized): Secondary | ICD-10-CM

## 2017-10-23 NOTE — Therapy (Signed)
[place] MAIN [facility] SERVICES [address] [place], [place], [address] Phone: [phone]   Fax:  [phone]  Speech Language Pathology Treatment  Patient Details  Name: Randall [name] MRN: [id number] Date of Birth: [birth year] Referring Provider: Dr. [name]   Encounter Date: 10/23/2017  End of Session - 10/23/17 1457    Visit Number  5    Number of Visits  17    Date for SLP Re-Evaluation  11/14/17    SLP Start Time  12    SLP Stop Time   1457    SLP Time Calculation (min)  57 min    Activity Tolerance  Patient tolerated treatment well       Past Medical History:  Diagnosis Date  . Allergic   . Colon adenoma   . Depression   . Diabetes mellitus, type II ([place])   . Diverticulosis   . Hypertension   . Seizures ([place])   . Sleep apnea   . Urinary hesitancy     Past Surgical History:  Procedure Laterality Date  . COLON SURGERY    . HERNIA REPAIR    . UVULOPALATOPHARYNGOPLASTY, TONSILLECTOMY AND SEPTOPLASTY      There were no vitals filed for this visit.  Subjective Assessment - 10/23/17 1456    Subjective  "I'm trying!"            ADULT SLP TREATMENT - 10/23/17 0001      General Information   Behavior/Cognition  Alert;Cooperative;Pleasant mood    HPI   67 year old man diagnosed with Parkinson's disease in 2016.  Patient states that he went through the LSVT LOUD and BIG in 2016.       Treatment Provided   Treatment provided  Cognitive-Linquistic      Pain Assessment   Pain Assessment  No/denies pain      Cognitive-Linquistic Treatment   Treatment focused on  Voice    Skilled Treatment  Daily Task #1 (Maximum sustained "ah"): Average 12 seconds, 85 dB. Daily Task 2 (Maximum fundamental frequency range): Highs: 15 high pitched "ah" given min cues. Lows: 15 low pitched "ah" given min cues. Daily task #3 (Maximum speech loudness drill of functional phrases): Average 75 dB.  Hierarchal speech loudness drill: Read complex  sentences, 73 dB.  Generate short responses, 72 dB.  Homework: assignments given.  Off the cuff remarks: average 68 dB, improves to 73 dB with SLP cues.      Assessment / Recommendations / Plan   Plan  Continue with current plan of care      Progression Toward Goals   Progression toward goals  Progressing toward goals       SLP Education - 10/23/17 1456    Education provided  Yes    Education Details  LSVT-LOUD- all the time    Person(s) Educated  Patient    Methods  Explanation    Comprehension  Verbalized understanding         SLP Long Term Goals - 10/13/17 1539      SLP LONG TERM GOAL #1   Title  The patient will complete Daily Tasks (Maximum duration "ah", High/Lows, and Functional Phrases) at average loudness of 80 dB and with loud, good quality voice.     Time  4    Period  Weeks    Status  New    Target Date  11/14/17      SLP LONG TERM GOAL #2   Title  The  patient will complete Hierarchal Speech Loudness reading drills (words/phrases, sentences, and paragraph) at average 75 dB and with loud, good quality voice.      Time  4    Period  Weeks    Status  New    Target Date  11/14/17      SLP LONG TERM GOAL #3   Title  The patient will participate in conversation, maintaining average loudness of 75 dB and loud, good quality voice.    Time  4    Period  Weeks    Status  New    Target Date  11/14/17      SLP LONG TERM GOAL #4   Title  The patient will complete homework daily.    Time  4    Period  Weeks    Status  New    Target Date  11/14/17       Plan - 10/23/17 1457    Clinical Impression Statement  The patient is completing daily tasks and hierarchal speech drill tasks with loud, good quality voice given fewer SLP cues.      Speech Therapy Frequency  4x / week    Duration  4 weeks    Treatment/Interventions  Patient/family education;Other (comment) LSVT-LOUD    Potential to Achieve Goals  Good    Potential Considerations  Ability to learn/carryover  information;Severity of impairments;Co-morbidities;Cooperation/participation level;Family/community support;Medical prognosis;Previous level of function    SLP Home Exercise Plan  LSVT-LOUD daily homework    Consulted and Agree with Plan of Care  Patient       Patient will benefit from skilled therapeutic intervention in order to improve the following deficits and impairments:   Dysphonia    Problem List Patient Active Problem List   Diagnosis Date Noted  . Adenoma of large intestine 02/01/2016  . Clinical depression 02/01/2016  . 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 Benign essential HTN 02/01/2016  . 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/2016  . Pure hypercholesterolemia 02/01/2016  . Type 2 diabetes mellitus ([place]) 01/10/2016  . Microalbuminuria 01/10/2016  . Degeneration of intervertebral disc of lumbar region 07/13/2015  . Parkinson's disease ([place]) 11/16/2012   [name], MS/CCC- SLP  [name] 10/23/2017, 2:58 PM  [place] MAIN [facility] SERVICES [address] [place], [place], [address] Phone: [phone]   Fax:  [phone]   Name: TISON LEIBOLD MRN: [id number] Date of Birth: 24-Feb-1951

## 2017-10-25 ENCOUNTER — Encounter: Payer: Self-pay | Admitting: Occupational Therapy

## 2017-10-25 NOTE — Therapy (Signed)
[place] MAIN [facility] SERVICES [address] [place], [place], [address] Phone: [phone]   Fax:  [phone]  Occupational Therapy Evaluation  Patient Details  Name: Randall [name] MRN: [id number] Date of Birth: 09-21-1950 Referring Provider: Joselyn Arrow   Encounter Date: 10/13/2017  OT End of Session - 10/25/17 0708    Visit Number  1    Number of Visits  17    Date for OT Re-Evaluation  11/21/17    OT Start Time  6962    OT Stop Time  1400    OT Time Calculation (min)  62 min    Activity Tolerance  Patient tolerated treatment well    Behavior During Therapy  [facility] for tasks assessed/performed       Past Medical History:  Diagnosis Date  . Allergic   . Colon adenoma   . Depression   . Diabetes mellitus, type II ([place])   . Diverticulosis   . Hypertension   . Seizures ([place])   . Sleep apnea   . Urinary hesitancy     Past Surgical History:  Procedure Laterality Date  . COLON SURGERY    . HERNIA REPAIR    . UVULOPALATOPHARYNGOPLASTY, TONSILLECTOMY AND SEPTOPLASTY      There were no vitals filed for this visit.  Subjective Assessment - 10/24/17 0648    Subjective   Patient reports he has had Parkinson's for about 8 years. He complains of tremors in the right hand. He reports he feels discouraged because he has difficulty cutting meat. If it is cold, it causes his right shoulder to hurt all the way to the hand. His symptoms depend on the day, some worse than others.     Pertinent History  Patient was diagnosed with Parkinson's about 8 years ago, he reports a recent increase in tremors in right hand especially when he feels stressed or cold (which is often).  Patient has had increased difficulty with walking, increased foot drag on the right, no recent falls. Increased difficulty in recent months with self care tasks.     Patient Stated Goals  Patient reports he would like to be independent, move better, feed self and not have tremors.    Currently in Pain?  No/denies    Multiple Pain Sites  No        [facility] OT Assessment - 10/24/17 0659      Assessment   Medical Diagnosis  Parkinson's     Referring Provider  [name], H    Hand Dominance  Right    Prior Therapy  none      Precautions   Precautions  Fall      Balance Screen   Has the patient fallen in the past 6 months  Yes    How many times?  one time he can recall    Has the patient had a decrease in activity level because of a fear of falling?   No    Is the patient reluctant to leave their home because of a fear of falling?   No      Home  Environment   Family/patient expects to be discharged to:  Private residence    Living Arrangements  Alone    Available Help at Discharge  Family    Type of [place]  One level    Alternate Level Stairs - Number of Steps  2 steps to  enter    Bathroom Shower/Tub  Tub/Shower unit;Curtain    Shower/tub characteristics  Research scientist (physical sciences) Comments  He reports difficulty with managing buttons, uses electric toothbrush and can shave self with increased time. He has difficulty with socks at times. For homemaking tasks he can help with washing dishes, sweeping and light meal preparation. He feels he is most limited by his tremors.  Patient has difficulty with cutting meat.     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  Patient has increased difficulty with handwriting with decreased legibility.       IADL   Prior Level of Function Shopping  independent    Shopping  Shops independently for small purchases    Prior Level of Function Light Housekeeping  independent    Light Housekeeping  Performs light daily tasks such as dishwashing, bed making    Prior Level of Function Meal Prep  independent    Meal Prep  Able to complete simple warm meal prep    Prior Level of Function Metallurgist own vehicle    Prior Level of Function Medication Managment  independent    Medication Management  Has difficulty remembering to take medication    Prior Level of Function Financial Management  independent    Financial Management  Requires assistance      Mobility   Mobility Status  Freezing;History of falls    Mobility Status Comments  occasional hesitations      Written Expression   Dominant Hand  Right    Handwriting  50% legible      Vision - History   Baseline Vision  Wears glasses all the time    Visual History  Cataracts      Cognition   Overall Cognitive Status  History of cognitive impairments - at baseline has difficulty remembering to take medication at times.     Memory  Impaired    Memory Impairment  Decreased short term memory      Posture/Postural Control   Posture Comments  Patient with forwards flexed posture, tends to keep head down and eye gaze to the floor most times.       Sensation   Light Touch  Appears Intact    Stereognosis  Appears Intact    Hot/Cold  Appears Intact    Proprioception  Appears Intact    Additional Comments  Patient reports occasional numbness in hands and feet but comes and goes.       Coordination   Gross Motor Movements are Fluid and  Coordinated  No    Fine Motor Movements are Fluid and  Coordinated  No    Finger Nose Finger Test  impaired    9 Hole Peg Test  Right;Left    Right 9 Hole Peg Test  45 sec    Left 9 Hole Peg Test  27 sec      ROM / Strength   AROM / PROM / Strength  AROM;Strength      AROM   Overall AROM   Within functional limits for tasks performed      Strength   Overall Strength  Deficits    Overall Strength Comments  Decreased UE/LE strength 4/5 overall, decreased grip strength on right compared to left.       Hand Function   Right Hand Grip (lbs)  50    Right Hand Lateral Pinch  17 lbs    Right Hand 3 Point Pinch  15 lbs    Left Hand Grip (lbs)  62    Left Hand Lateral Pinch  21 lbs    Left 3 point pinch  17 lbs        6 minute walk test 1550 feet, Pt ambulates without assistive device, right arm in fisted position and demonstrates forced reciprocal arm swing, forward flexed posture with eye gaze to feet.  Patient tends to drag right foot with mobility and especially when fatigued, decreased step length on right.  Tends to walk fast with forward lean and decreased control of movement. BP pre test 131/76, post test 130/69.   5 times sit to stand 16 secs. Berg balance test to be performed next session Freezing of gait questionnaire score of 9                OT Education - 10/24/17 0707    Education provided  Yes    Education Details  LSVT BIG, role of OT, goals, POC    Person(s) Educated  Patient    Methods  Explanation    Comprehension  Verbalized understanding          OT Long Term Goals - 10/25/17 0714      OT LONG TERM GOAL #1   Title  Patient will complete HEP for maximal daily exercises with modified independence in 4 weeks      Baseline  no current home program    Time  4    Period  Weeks    Status  New    Target Date  11/21/17      OT LONG TERM GOAL #2   Title  Patient will improve gait speed and endurance and be able to walk 1650 feet in 6 minutes with improved posture and increased amplitude of gait to  negotiate around the home and community safely in 4 weeks.      Baseline  1550 feet at eval    Time  4    Period  Weeks    Status  New    Target Date  11/21/17      OT LONG TERM GOAL #3   Title  Patient will perform sit to stand from lower and unstable surfaces with modified independence.       Baseline  difficulty with lower surfaces    Time  4    Period  Weeks    Status  New    Target Date  11/21/17      OT LONG TERM GOAL #4   Title  Patient will complete handwriting with name  and address with 75% legibility.     Baseline  50% legibility at eval    Time  4    Period  Weeks    Status  New      OT LONG TERM GOAL #5   Title  Patient will complete sock donning with modified independence consistently.     Baseline  difficulty with donning socks at times.     Time  4    Period  Weeks    Status  New    Target Date  11/21/17            Plan - 10/25/17 0708    Clinical Impression Statement  Patient is a 67 yo male diagnosed with Parkinson's disease for about 8 years and was referred by his physician for LSVT BIG program. Patient presents with difficulty walking, muscle weakness UE/LE, decreased reciprocal arm swing, decreased functional balance, history of falls, mild hesitations with functional mobility, difficulty with transitional movements, and decreased coordination which affect his ability to perform daily tasks. The patient is judged to be an excellent candidate for the LSVT BIG program. He would benefit from and was referred for the LSVT BIG program which is an intensive program designed specifically for Parkinson's patients with a focus on increasing amplitude and speed of movements, improving self-care and daily tasks and providing patients with daily exercises to improve overall function. It is recommended that the patient receive the LSVT BIG program which is comprised of 16 intensive sessions (4 times per week for 4 weeks, one hour sessions). Prognosis for improvement is  good based on his motivation and strong family support. LSVT BIG has been documented in the literature as efficacious for individuals with Parkinson's disease.    Occupational Profile and client history currently impacting functional performance  progression of Parkinson's disease, decreased balance, increased right hand tremors, increased difficulty with self care tasks, difficulty walking    Occupational performance deficits (Please refer to evaluation for details):  ADL's;IADL's;Leisure;Social Participation;Rest and Sleep    Rehab Potential  Good    Current Impairments/barriers affecting progress:  progressive disease, balance, posture, anxiety, memory    OT Frequency  4x / week    OT Duration  4 weeks    OT Treatment/Interventions  Self-care/ADL training;Moist Heat;DME and/or AE instruction;Balance training;Gait Training;Therapeutic activities;Therapeutic exercise;Stair Training;Cognitive remediation/compensation;Neuromuscular education;Functional Mobility Training;Patient/family education    Clinical Decision Making  Limited treatment options, no task modification necessary    Consulted and Agree with Plan of Care  Patient       Patient will benefit from skilled therapeutic intervention in order to improve the following deficits and impairments:  Abnormal gait, Decreased cognition, Decreased knowledge of use of DME, Decreased coordination, Decreased mobility, Improper body mechanics, Decreased activity tolerance, Decreased endurance, Decreased strength, Decreased balance, Difficulty walking, Impaired UE functional use  Visit Diagnosis: Difficulty in walking, not elsewhere classified  Muscle weakness (generalized)  Other lack of coordination  Unsteadiness on feet    Problem List Patient Active Problem List   Diagnosis Date Noted  . Adenoma of large intestine 02/01/2016  . Clinical depression 02/01/2016  . 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 Benign essential  HTN 02/01/2016  . 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/2016  . Pure hypercholesterolemia 02/01/2016  . Type 2 diabetes mellitus ([place]) 01/10/2016  . Microalbuminuria 01/10/2016  . Degeneration of intervertebral disc of lumbar region 07/13/2015  . Parkinson's disease ([place]) 11/16/2012   Amy T Lovett, OTR/L, CLT  Lovett,Amy 10/25/2017, 7:21  AM  [place] MAIN [facility] SERVICES [address] [place], [place], [address] Phone: [phone]   Fax:  [phone]  Name: Randall [name] MRN: [id number] Date of Birth: 01/18/1951

## 2017-10-25 NOTE — Therapy (Signed)
[place] MAIN [facility] SERVICES [address] [place], [place], [address] Phone: [phone]   Fax:  [phone]  Occupational Therapy Treatment  Patient Details  Name: Randall [name] MRN: [id number] Date of Birth: [birth year] Referring Provider: Joselyn Arrow   Encounter Date: 10/23/2017  OT End of Session - 10/25/17 0958    Visit Number  5    Number of Visits  17    Date for OT Re-Evaluation  11/21/17    OT Start Time  1300    OT Stop Time  1400    OT Time Calculation (min)  60 min    Activity Tolerance  Patient tolerated treatment well    Behavior During Therapy  [facility] for tasks assessed/performed       Past Medical History:  Diagnosis Date  . Allergic   . Colon adenoma   . Depression   . Diabetes mellitus, type II ([place])   . Diverticulosis   . Hypertension   . Seizures ([place])   . Sleep apnea   . Urinary hesitancy     Past Surgical History:  Procedure Laterality Date  . COLON SURGERY    . HERNIA REPAIR    . UVULOPALATOPHARYNGOPLASTY, TONSILLECTOMY AND SEPTOPLASTY      There were no vitals filed for this visit.  Subjective Assessment - 10/25/17 0956    Subjective   Patient states, "Happy Valentine's Day".  Patient reports he feels he is doing well and states, "I couldn't this a week ago"  referring to some exercises and sit to stand without use of arms    Pertinent History  Patient was diagnosed with Parkinson's about 8 years ago, he reports a recent increase in tremors in right hand especially when he feels stressed or cold (which is often).  Patient has had increased difficulty with walking, increased foot drag on the right, no recent falls. Increased difficulty in recent months with self care tasks.     Patient Stated Goals  Patient reports he would like to be independent, move better, feed self and not have tremors.    Currently in Pain?  No/denies    Multiple Pain Sites  No         [facility] OT Assessment - 10/24/17 0659       Assessment   Medical Diagnosis  Parkinson's     Referring Provider  [name], H    Hand Dominance  Right    Prior Therapy  none      Precautions   Precautions  Fall      Balance Screen   Has the patient fallen in the past 6 months  Yes    How many times?  one time he can recall    Has the patient had a decrease in activity level because of a fear of falling?   No    Is the patient reluctant to leave their home because of a fear of falling?   No      Home  Environment   Family/patient expects to be discharged to:  Private residence    Living Arrangements  Alone    Available Help at Discharge  Family    Type of [place]  One level    Alternate Level Stairs - Number of Steps  2 steps to enter    Bathroom Shower/Tub  Tub/Shower unit;Curtain    Shower/tub characteristics  Curtain    Bathroom  Toilet  Standard    Additional Comments  He reports difficulty with managing buttons, uses electric toothbrush and can shave self with increased time. He has difficulty with socks at times. For homemaking tasks he can help with washing dishes, sweeping and light meal preparation. He feels he is most limited by his tremors.  Patient has difficulty with cutting meat.     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  Patient has increased difficulty with handwriting with decreased legibility.       IADL   Prior Level of Function Shopping  independent    Shopping  Shops independently for small purchases    Prior Level of Function Light Housekeeping  independent    Light Housekeeping  Performs light daily tasks such as dishwashing, bed making    Prior Level of Function Meal Prep  independent    Meal Prep  Able to complete simple warm meal prep    Prior Level of Function Metallurgist own vehicle    Prior Level of Function Medication Managment  independent    Medication Management  Has difficulty remembering to take medication    Prior Level of Function Financial Management  independent    Financial Management  Requires assistance      Mobility   Mobility Status  Freezing;History of falls    Mobility Status Comments  occasional hesitations      Written Expression   Dominant Hand  Right    Handwriting  50% legible      Vision - History   Baseline Vision  Wears glasses all the time    Visual History  Cataracts      Cognition   Overall Cognitive Status  History of cognitive impairments - at baseline has difficulty remembering to take medication at times.     Memory  Impaired    Memory Impairment  Decreased short term memory      Posture/Postural Control   Posture Comments  Patient with forwards flexed posture, tends to keep head down and eye gaze to the floor most times.       Sensation   Light Touch  Appears Intact    Stereognosis  Appears Intact    Hot/Cold  Appears Intact    Proprioception  Appears Intact    Additional Comments  Patient reports occasional numbness in hands and feet but comes and goes.       Coordination   Gross Motor Movements are Fluid and Coordinated  No    Fine Motor Movements are Fluid and Coordinated  No    Finger Nose  Finger Test  impaired    9 Hole Peg Test  Right;Left    Right 9 Hole Peg Test  45 sec     Left 9 Hole Peg Test  27 sec      ROM / Strength   AROM / PROM / Strength  AROM;Strength      AROM   Overall AROM   Within functional limits for tasks performed      Strength   Overall Strength  Deficits    Overall Strength Comments  Decreased UE/LE strength 4/5 overall, decreased grip strength on right compared to left.       Hand Function   Right Hand Grip (lbs)  50    Right Hand Lateral Pinch  17 lbs    Right Hand 3 Point Pinch  15 lbs    Left Hand Grip (lbs)  62    Left Hand Lateral Pinch  21 lbs    Left 3 point pinch  17 lbs               OT Treatments/Exercises (OP) - 10/25/17 1000      ADLs   ADL Comments  Functional component tasks as follows:  1)  sit to stand from chair without the use of arms, 5 repetitions and cues for technique, 2)  crossing legs to reach to put on socks, 3) managing buttons with implementation of hand flicks prior to buttons, 4)  handwriting with larger lined paper, 5) reciprocal arm and stepping patterns.      Neurological Re-education Exercises   Other Exercises 1  Patient seen for initial instruction of LSVT BIG exercises: LSVT Daily Session Maximal Daily Exercises: Sustained movements are designed to rescale the amplitude of movement output for generalization to daily functional activities. Performed as follows for 1 set of 10 repetitions each: Multi directional sustained movements- 1) Floor to ceiling, 2) Side to side. Multi directional Repetitive movements performed in standing and are designed to provide retraining effort needed for sustained muscle activation in tasks Performed as follows: 3) Step and reach forward, 4) Step and Reach Backwards, 5) Step and reach sideways, 6) Rock and reach forward/backward, 7) Rock and reach sideways. Requires CGA to minimal assist for all exercises in standing with Cues for arm positioning during exercises.    Other Exercises 2  Functional mobility for 2 trials of 400 feet with short rest breaks as  needed, cues for amplitude of gait, with emphasis on step height and length.               OT Education - 10/25/17 [phone]    Education provided  Yes    Education Details  daily exercises, amplitude of gait    Person(s) Educated  Patient    Methods  Explanation;Demonstration;Verbal cues    Comprehension  Verbal cues required;Returned demonstration;Verbalized understanding          OT Long Term Goals - 10/25/17 0959      OT LONG TERM GOAL #1   Title  Patient will complete HEP for maximal daily exercises with modified independence in 4 weeks      Baseline  no current home program    Time  4    Period  Weeks    Status  On-going      OT LONG TERM GOAL #2   Title  Patient will improve gait speed and endurance and be able to walk 1650 feet in 6 minutes with improved posture and increased amplitude of gait to  negotiate around the home and community safely in 4 weeks.      Baseline  1550 feet at eval    Time  4    Period  Weeks    Status  On-going      OT LONG TERM GOAL #3   Title  Patient will perform sit to stand from lower and unstable surfaces with modified independence.       Baseline  difficulty with lower surfaces    Time  4    Period  Weeks    Status  On-going      OT LONG TERM GOAL #4   Title  Patient will complete handwriting with name and address with 75% legibility.     Baseline  50% legibility at eval    Time  4    Period  Weeks    Status  On-going      OT LONG TERM GOAL #5   Title  Patient will complete sock donning with modified independence consistently.     Baseline  difficulty with donning socks at times.     Time  4    Period  Weeks    Status  On-going            Plan - 10/25/17 7564    Clinical Impression Statement  Patient progressing with maximal daily exercise performance, continues to require cues for form and technique, cues for posture with exercises and functional mobility.  Did well with the implementation of finger flicks prior to  buttons and handwriting to perform tasks with BIG principles.     Occupational Profile and client history currently impacting functional performance  progression of Parkinson's disease, decreased balance, increased right hand tremors, increased difficulty with self care tasks, difficulty walking    Occupational performance deficits (Please refer to evaluation for details):  ADL's;IADL's;Leisure;Social Participation;Rest and Sleep    Rehab Potential  Good    Current Impairments/barriers affecting progress:  progressive disease, balance, posture, anxiety, memory    OT Frequency  4x / week    OT Duration  4 weeks    OT Treatment/Interventions  Self-care/ADL training;Moist Heat;DME and/or AE instruction;Balance training;Gait Training;Therapeutic activities;Therapeutic exercise;Stair Training;Cognitive remediation/compensation;Neuromuscular education;Functional Mobility Training;Patient/family education    Consulted and Agree with Plan of Care  Patient       Patient will benefit from skilled therapeutic intervention in order to improve the following deficits and impairments:  Abnormal gait, Decreased cognition, Decreased knowledge of use of DME, Decreased coordination, Decreased mobility, Improper body mechanics, Decreased activity tolerance, Decreased endurance, Decreased strength, Decreased balance, Difficulty walking, Impaired UE functional use  Visit Diagnosis: Difficulty in walking, not elsewhere classified  Muscle weakness (generalized)  Other lack of coordination  Unsteadiness on feet    Problem List Patient Active Problem List   Diagnosis Date Noted  . Adenoma of large intestine 02/01/2016  . Clinical depression 02/01/2016  . 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 Benign essential HTN 02/01/2016  . 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/2016  . Pure hypercholesterolemia 02/01/2016  . Type 2 diabetes mellitus ([place]) 01/10/2016  . Microalbuminuria 01/10/2016  .  Degeneration of intervertebral disc of lumbar region 07/13/2015  . Parkinson's disease ([place]) 11/16/2012   Panhia Karl T Tomasita Morrow, OTR/L, CLT  Careen Mauch 10/25/2017, 10:04 AM  [place] MAIN [facility] SERVICES [address] [place], [place], [address] Phone: [phone]   Fax:  [phone]  Name: Randall [name] MRN: [id number] Date of Birth: 06-Aug-1951

## 2017-10-25 NOTE — Therapy (Signed)
[place] MAIN [facility] SERVICES [address] [place], [place], [address] Phone: [phone]   Fax:  [phone]  Occupational Therapy Treatment  Patient Details  Name: Randall [name] MRN: [id number] Date of Birth: 1951/01/08 Referring Provider: Joselyn Arrow   Encounter Date: 10/22/2017  OT End of Session - 10/25/17 0949    Visit Number  4    Number of Visits  17    Date for OT Re-Evaluation  11/21/17    OT Start Time  1301    OT Stop Time  1400    OT Time Calculation (min)  59 min    Activity Tolerance  Patient tolerated treatment well    Behavior During Therapy  [facility] for tasks assessed/performed       Past Medical History:  Diagnosis Date  . Allergic   . Colon adenoma   . Depression   . Diabetes mellitus, type II ([place])   . Diverticulosis   . Hypertension   . Seizures ([place])   . Sleep apnea   . Urinary hesitancy     Past Surgical History:  Procedure Laterality Date  . COLON SURGERY    . HERNIA REPAIR    . UVULOPALATOPHARYNGOPLASTY, TONSILLECTOMY AND SEPTOPLASTY      There were no vitals filed for this visit.  Subjective Assessment - 10/25/17 0947    Subjective   Patient reports he can't remember the exercises but they are familiar as we perform them.     Pertinent History  Patient was diagnosed with Parkinson's about 8 years ago, he reports a recent increase in tremors in right hand especially when he feels stressed or cold (which is often).  Patient has had increased difficulty with walking, increased foot drag on the right, no recent falls. Increased difficulty in recent months with self care tasks.     Patient Stated Goals  Patient reports he would like to be independent, move better, feed self and not have tremors.    Currently in Pain?  No/denies    Multiple Pain Sites  No         [facility] OT Assessment - 10/24/17 0659      Assessment   Medical Diagnosis  Parkinson's     Referring Provider  [name], H    Hand Dominance  Right     Prior Therapy  none      Precautions   Precautions  Fall      Balance Screen   Has the patient fallen in the past 6 months  Yes    How many times?  one time he can recall    Has the patient had a decrease in activity level because of a fear of falling?   No    Is the patient reluctant to leave their home because of a fear of falling?   No      Home  Environment   Family/patient expects to be discharged to:  Private residence    Living Arrangements  Alone    Available Help at Discharge  Family    Type of [place]  One level    Alternate Level Stairs - Number of Steps  2 steps to enter    Bathroom Shower/Tub  Tub/Shower unit;Curtain    Shower/tub characteristics  Curtain    Field seismologist    Additional Comments  He reports difficulty with managing buttons, uses electric toothbrush and  can shave self with increased time. He has difficulty with socks at times. For homemaking tasks he can help with washing dishes, sweeping and light meal preparation. He feels he is most limited by his tremors.  Patient has difficulty with cutting meat.     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  Patient has increased difficulty with handwriting with decreased legibility.       IADL    Prior Level of Function Shopping  independent    Shopping  Shops independently for small purchases    Prior Level of Function Light Housekeeping  independent    Light Housekeeping  Performs light daily tasks such as dishwashing, bed making    Prior Level of Function Meal Prep  independent    Meal Prep  Able to complete simple warm meal prep    Prior Level of Function Metallurgist own vehicle    Prior Level of Function Medication Managment  independent    Medication Management  Has difficulty remembering to take medication    Prior Level of Function Financial Management  independent    Financial Management  Requires assistance      Mobility   Mobility Status  Freezing;History of falls    Mobility Status Comments  occasional hesitations      Written Expression   Dominant Hand  Right    Handwriting  50% legible      Vision - History   Baseline Vision  Wears glasses all the time    Visual History  Cataracts      Cognition   Overall Cognitive Status  History of cognitive impairments - at baseline has difficulty remembering to take medication at times.     Memory  Impaired    Memory Impairment  Decreased short term memory      Posture/Postural Control   Posture Comments  Patient with forwards flexed posture, tends to keep head down and eye gaze to the floor most times.       Sensation   Light Touch  Appears Intact    Stereognosis  Appears Intact    Hot/Cold  Appears Intact    Proprioception  Appears Intact    Additional Comments  Patient reports occasional numbness in hands and feet but comes and goes.       Coordination   Gross Motor Movements are Fluid and Coordinated  No    Fine Motor Movements are Fluid and Coordinated  No    Finger Nose Finger Test  impaired    9 Hole Peg Test  Right;Left    Right [address]  Peg Test  45 sec    Left 9 Hole Peg Test  27 sec      ROM / Strength   AROM / PROM / Strength  AROM;Strength       AROM   Overall AROM   Within functional limits for tasks performed      Strength   Overall Strength  Deficits    Overall Strength Comments  Decreased UE/LE strength 4/5 overall, decreased grip strength on right compared to left.       Hand Function   Right Hand Grip (lbs)  50    Right Hand Lateral Pinch  17 lbs    Right Hand 3 Point Pinch  15 lbs    Left Hand Grip (lbs)  62    Left Hand Lateral Pinch  21 lbs    Left 3 point pinch  17 lbs               OT Treatments/Exercises (OP) - 10/25/17 0951      ADLs   ADL Comments  Functional component tasks as follows:  1)  sit to stand from chair without the use of arms, 5 repetitions and cues for technique, 2)  crossing legs to reach to put on socks, 3) managing buttons with implementation of hand flicks prior to buttons, 4)  handwriting with larger lined paper, 5) reciprocal arm and stepping patterns.      Neurological Re-education Exercises   Other Exercises 1  Patient seen for initial instruction of LSVT BIG exercises: LSVT Daily Session Maximal Daily Exercises: Sustained movements are designed to rescale the amplitude of movement output for generalization to daily functional activities. Performed as follows for 1 set of 10 repetitions each: Multi directional sustained movements- 1) Floor to ceiling, 2) Side to side. Multi directional Repetitive movements performed in standing and are designed to provide retraining effort needed for sustained muscle activation in tasks Performed as follows: 3) Step and reach forward, 4) Step and Reach Backwards, 5) Step and reach sideways, 6) Rock and reach forward/backward, 7) Rock and reach sideways. Requires CGA to minimal assist for all exercises in standing with Cues for arm positioning during exercises.    Other Exercises 2  Functional mobility skills for 1 trial of 600 feet, indoors on flat surfaces, cues for turning behaviors and for amplitude of gait.  Cues for reciprocal arm swing.  Balance  activities in standing with use of balance pad, tossing balls into container with CGA for balance.  Patient also seen for balance and body mechanics with picking balls up off the floor to place into basket.              OT Education - 10/25/17 [phone]    Education provided  Yes    Education Details  LSVT BIG exercises, posture, functional component tasks    Person(s) Educated  Patient    Methods  Explanation;Demonstration;Verbal cues    Comprehension  Verbal cues required;Returned demonstration;Verbalized understanding          OT Long Term Goals - 10/25/17 0714      OT LONG TERM GOAL #1   Title  Patient will complete HEP for maximal daily exercises with modified independence in 4 weeks      Baseline  no current home program    Time  4    Period  Weeks    Status  New    Target Date  11/21/17      OT LONG TERM GOAL #2  Title  Patient will improve gait speed and endurance and be able to walk 1650 feet in 6 minutes with improved posture and increased amplitude of gait to negotiate around the home and community safely in 4 weeks.      Baseline  1550 feet at eval    Time  4    Period  Weeks    Status  New    Target Date  11/21/17      OT LONG TERM GOAL #3   Title  Patient will perform sit to stand from lower and unstable surfaces with modified independence.       Baseline  difficulty with lower surfaces    Time  4    Period  Weeks    Status  New    Target Date  11/21/17      OT LONG TERM GOAL #4   Title  Patient will complete handwriting with name and address with 75% legibility.     Baseline  50% legibility at eval    Time  4    Period  Weeks    Status  New      OT LONG TERM GOAL #5   Title  Patient will complete sock donning with modified independence consistently.     Baseline  difficulty with donning socks at times.     Time  4    Period  Weeks    Status  New    Target Date  11/21/17            Plan - 10/25/17 0950    Clinical Impression Statement   Patient continuing to work towards daily exercises, issued written/pictorial handout with LSVT BIG maximal daily exercises for progression to home exercises.  Patient requires therapist demo and assist for exercises, cues for amplitude of gait and will continue to work towards calibration of movements with emphasis on height of stepping patterns and step length.  Cues for turning techniques to reduce hesistations.     Occupational Profile and client history currently impacting functional performance  progression of Parkinson's disease, decreased balance, increased right hand tremors, increased difficulty with self care tasks, difficulty walking    Occupational performance deficits (Please refer to evaluation for details):  ADL's;IADL's;Leisure;Social Participation;Rest and Sleep    Rehab Potential  Good    Current Impairments/barriers affecting progress:  progressive disease, balance, posture, anxiety, memory    OT Frequency  4x / week    OT Duration  4 weeks    OT Treatment/Interventions  Self-care/ADL training;Moist Heat;DME and/or AE instruction;Balance training;Gait Training;Therapeutic activities;Therapeutic exercise;Stair Training;Cognitive remediation/compensation;Neuromuscular education;Functional Mobility Training;Patient/family education    Consulted and Agree with Plan of Care  Patient       Patient will benefit from skilled therapeutic intervention in order to improve the following deficits and impairments:  Abnormal gait, Decreased cognition, Decreased knowledge of use of DME, Decreased coordination, Decreased mobility, Improper body mechanics, Decreased activity tolerance, Decreased endurance, Decreased strength, Decreased balance, Difficulty walking, Impaired UE functional use  Visit Diagnosis: Difficulty in walking, not elsewhere classified  Muscle weakness (generalized)  Other lack of coordination  Unsteadiness on feet    Problem List Patient Active Problem List    Diagnosis Date Noted  . Adenoma of large intestine 02/01/2016  . Clinical depression 02/01/2016  . 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 Benign essential HTN 02/01/2016  . 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/2016  . Pure hypercholesterolemia 02/01/2016  . Type 2 diabetes mellitus ([place]) 01/10/2016  .  Microalbuminuria 01/10/2016  . Degeneration of intervertebral disc of lumbar region 07/13/2015  . Parkinson's disease ([place]) 11/16/2012   Shaylen Nephew T Tomasita Morrow, OTR/L, CLT  Roan Miklos 10/25/2017, 9:55 AM  [place] MAIN [facility] SERVICES [address] [place], [place], [address] Phone: [phone]   Fax:  [phone]  Name: JVON MERONEY MRN: [id number] Date of Birth: 11-22-1950

## 2017-10-25 NOTE — Therapy (Signed)
[place] MAIN [facility] SERVICES [address] [place], [place], [address] Phone: [phone]   Fax:  [phone]  Occupational Therapy Treatment  Patient Details  Name: Randall [name] MRN: [id number] Date of Birth: [birth year] Referring Provider: Joselyn Arrow   Encounter Date: 10/20/2017  OT End of Session - 10/25/17 0929    Visit Number  2    Number of Visits  17    Date for OT Re-Evaluation  11/21/17    OT Start Time  1300    OT Stop Time  1358    OT Time Calculation (min)  58 min    Activity Tolerance  Patient tolerated treatment well    Behavior During Therapy  [facility] for tasks assessed/performed       Past Medical History:  Diagnosis Date  . Allergic   . Colon adenoma   . Depression   . Diabetes mellitus, type II ([place])   . Diverticulosis   . Hypertension   . Seizures ([place])   . Sleep apnea   . Urinary hesitancy     Past Surgical History:  Procedure Laterality Date  . COLON SURGERY    . HERNIA REPAIR    . UVULOPALATOPHARYNGOPLASTY, TONSILLECTOMY AND SEPTOPLASTY      There were no vitals filed for this visit.  Subjective Assessment - 10/25/17 0928    Subjective   Patient reports he is ready to get started, "Just tell me if I do anything wrong."    Pertinent History  Patient was diagnosed with Parkinson's about 8 years ago, he reports a recent increase in tremors in right hand especially when he feels stressed or cold (which is often).  Patient has had increased difficulty with walking, increased foot drag on the right, no recent falls. Increased difficulty in recent months with self care tasks.     Patient Stated Goals  Patient reports he would like to be independent, move better, feed self and not have tremors.    Currently in Pain?  No/denies    Multiple Pain Sites  No         [facility] OT Assessment - 10/24/17 0659      Assessment   Medical Diagnosis  Parkinson's     Referring Provider  [name], H    Hand Dominance  Right    Prior  Therapy  none      Precautions   Precautions  Fall      Balance Screen   Has the patient fallen in the past 6 months  Yes    How many times?  one time he can recall    Has the patient had a decrease in activity level because of a fear of falling?   No    Is the patient reluctant to leave their home because of a fear of falling?   No      Home  Environment   Family/patient expects to be discharged to:  Private residence    Living Arrangements  Alone    Available Help at Discharge  Family    Type of [place]  One level    Alternate Level Stairs - Number of Steps  2 steps to enter    Bathroom Shower/Tub  Tub/Shower unit;Curtain    Shower/tub characteristics  Curtain    Field seismologist    Additional Comments  He reports difficulty with managing buttons, uses electric toothbrush and  can shave self with increased time. He has difficulty with socks at times. For homemaking tasks he can help with washing dishes, sweeping and light meal preparation. He feels he is most limited by his tremors.  Patient has difficulty with cutting meat.     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  Patient has increased difficulty with handwriting with decreased legibility.       IADL   Prior Level  of Function Shopping  independent    Shopping  Shops independently for small purchases    Prior Level of Function Light Housekeeping  independent    Light Housekeeping  Performs light daily tasks such as dishwashing, bed making    Prior Level of Function Meal Prep  independent    Meal Prep  Able to complete simple warm meal prep    Prior Level of Function Metallurgist own vehicle    Prior Level of Function Medication Managment  independent    Medication Management  Has difficulty remembering to take medication    Prior Level of Function Financial Management  independent    Financial Management  Requires assistance      Mobility   Mobility Status  Freezing;History of falls    Mobility Status Comments  occasional hesitations      Written Expression   Dominant Hand  Right    Handwriting  50% legible      Vision - History   Baseline Vision  Wears glasses all the time    Visual History  Cataracts      Cognition   Overall Cognitive Status  History of cognitive impairments - at baseline has difficulty remembering to take medication at times.     Memory  Impaired    Memory Impairment  Decreased short term memory      Posture/Postural Control   Posture Comments  Patient with forwards flexed posture, tends to keep head down and eye gaze to the floor most times.       Sensation   Light Touch  Appears Intact    Stereognosis  Appears Intact    Hot/Cold  Appears Intact    Proprioception  Appears Intact    Additional Comments  Patient reports occasional numbness in hands and feet but comes and goes.       Coordination   Gross Motor Movements are Fluid and Coordinated  No    Fine Motor Movements are Fluid and Coordinated  No    Finger Nose Finger Test  impaired    9 Hole Peg Test  Right;Left    Right [address]  Peg Test  45 sec    Left 9 Hole Peg Test  27 sec      ROM / Strength   AROM / PROM / Strength  AROM;Strength      AROM    Overall AROM   Within functional limits for tasks performed      Strength   Overall Strength  Deficits    Overall Strength Comments  Decreased UE/LE strength 4/5 overall, decreased grip strength on right compared to left.       Hand Function   Right Hand Grip (lbs)  50    Right Hand Lateral Pinch  17 lbs    Right Hand 3 Point Pinch  15 lbs    Left Hand Grip (lbs)  62    Left Hand Lateral Pinch  21 lbs    Left 3 point pinch  17 lbs               OT Treatments/Exercises (OP) - 10/25/17 0931      Neurological Re-education Exercises   Other Exercises 1  Patient seen for initial instruction of LSVT BIG exercises: LSVT Daily Session Maximal Daily Exercises: Sustained movements are designed to rescale the amplitude of movement output for generalization to daily functional activities. Performed as follows for 1 set of 10 repetitions each: Multi directional sustained movements- 1) Floor to ceiling, 2) Side to side. Multi directional Repetitive movements performed in standing and are designed to provide retraining effort needed for sustained muscle activation in tasks Performed as follows: 3) Step and reach forward, 4) Step and Reach Backwards, 5) Step and reach sideways, 6) Rock and reach forward/backward, 7) Rock and reach sideways. Requires CGA to minimal assist for all exercises in standing with Cues for arm positioning during exercises.    Other Exercises 2  Functional mobility for 2 trials of 400 feet with short rest breaks as needed, cues for amplitude of gait, with emphasis on step height and length.               OT Education - 10/25/17 [phone]    Education provided  Yes    Education Details  LSVT BIG maximal daily exercises, amplitude of gait    Person(s) Educated  Patient    Methods  Explanation;Demonstration;Verbal cues    Comprehension  Verbal cues required;Returned demonstration;Verbalized understanding          OT Long Term Goals - 10/25/17 0714      OT LONG TERM  GOAL #1   Title  Patient will complete HEP for maximal daily exercises with modified independence in 4 weeks      Baseline  no current home program    Time  4    Period  Weeks    Status  New    Target Date  11/21/17      OT LONG TERM GOAL #2   Title  Patient will improve gait speed and endurance and be able to walk 1650 feet in 6 minutes with improved posture and increased amplitude of gait to negotiate around the home and community safely in 4 weeks.      Baseline  1550 feet at eval    Time  4    Period  Weeks    Status  New    Target Date  11/21/17      OT LONG TERM GOAL #3   Title  Patient will perform sit to stand from lower and unstable surfaces with modified independence.  Baseline  difficulty with lower surfaces    Time  4    Period  Weeks    Status  New    Target Date  11/21/17      OT LONG TERM GOAL #4   Title  Patient will complete handwriting with name and address with 75% legibility.     Baseline  50% legibility at eval    Time  4    Period  Weeks    Status  New      OT LONG TERM GOAL #5   Title  Patient will complete sock donning with modified independence consistently.     Baseline  difficulty with donning socks at times.     Time  4    Period  Weeks    Status  New    Target Date  11/21/17            Plan - 10/25/17 0930    Clinical Impression Statement  Patient seen for the initial instruction on LSVT BIG maximal daily exercises, patient able to complete with min to CGA for balance in standing, verbal and physical cues with therapist demonstration and guiding as needed.  With functional mobility, patient tends to shuffle feet especially on the right and requires cues for step height and length along with cues for reciprocal arm swing.     Occupational Profile and client history currently impacting functional performance  progression of Parkinson's disease, decreased balance, increased right hand tremors, increased difficulty with self care tasks,  difficulty walking    Occupational performance deficits (Please refer to evaluation for details):  ADL's;IADL's;Leisure;Social Participation;Rest and Sleep    Rehab Potential  Good    Current Impairments/barriers affecting progress:  progressive disease, balance, posture, anxiety, memory    OT Frequency  4x / week    OT Duration  4 weeks    OT Treatment/Interventions  Self-care/ADL training;Moist Heat;DME and/or AE instruction;Balance training;Gait Training;Therapeutic activities;Therapeutic exercise;Stair Training;Cognitive remediation/compensation;Neuromuscular education;Functional Mobility Training;Patient/family education    Consulted and Agree with Plan of Care  Patient       Patient will benefit from skilled therapeutic intervention in order to improve the following deficits and impairments:  Abnormal gait, Decreased cognition, Decreased knowledge of use of DME, Decreased coordination, Decreased mobility, Improper body mechanics, Decreased activity tolerance, Decreased endurance, Decreased strength, Decreased balance, Difficulty walking, Impaired UE functional use  Visit Diagnosis: Difficulty in walking, not elsewhere classified  Muscle weakness (generalized)  Other lack of coordination  Unsteadiness on feet    Problem List Patient Active Problem List   Diagnosis Date Noted  . Adenoma of large intestine 02/01/2016  . Clinical depression 02/01/2016  . 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 Benign essential HTN 02/01/2016  . 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/2016  . Pure hypercholesterolemia 02/01/2016  . Type 2 diabetes mellitus ([place]) 01/10/2016  . Microalbuminuria 01/10/2016  . Degeneration of intervertebral disc of lumbar region 07/13/2015  . Parkinson's disease ([place]) 11/16/2012   Elye Harmsen T Tomasita Morrow, OTR/L, CLT  Valor Quaintance 10/25/2017, 9:36 AM  [place] MAIN [facility] SERVICES [address] [place], [place],  [address] Phone: [phone]   Fax:  [phone]  Name: DIONTAY ROSENCRANS MRN: [id number] Date of Birth: 1950/12/06

## 2017-10-25 NOTE — Therapy (Signed)
[place] MAIN [facility] SERVICES [address] [place], [place], [address] Phone: [phone]   Fax:  [phone]  Occupational Therapy Treatment  Patient Details  Name: Randall [name] MRN: [id number] Date of Birth: [birth year] Referring Provider: Joselyn Arrow   Encounter Date: 10/21/2017  OT End of Session - 10/25/17 0939    Visit Number  3    Number of Visits  17    Date for OT Re-Evaluation  11/21/17    OT Start Time  1330    OT Stop Time  1430    OT Time Calculation (min)  60 min    Activity Tolerance  Patient tolerated treatment well    Behavior During Therapy  [facility] for tasks assessed/performed       Past Medical History:  Diagnosis Date  . Allergic   . Colon adenoma   . Depression   . Diabetes mellitus, type II ([place])   . Diverticulosis   . Hypertension   . Seizures ([place])   . Sleep apnea   . Urinary hesitancy     Past Surgical History:  Procedure Laterality Date  . COLON SURGERY    . HERNIA REPAIR    . UVULOPALATOPHARYNGOPLASTY, TONSILLECTOMY AND SEPTOPLASTY      There were no vitals filed for this visit.  Subjective Assessment - 10/25/17 0938    Subjective   Patient reports he got here early thinking his appointment was at 1:00 and it was not until 1:30.  He reports feeling a bit sore from yesterday's session but no pain.     Pertinent History  Patient was diagnosed with Parkinson's about 8 years ago, he reports a recent increase in tremors in right hand especially when he feels stressed or cold (which is often).  Patient has had increased difficulty with walking, increased foot drag on the right, no recent falls. Increased difficulty in recent months with self care tasks.     Patient Stated Goals  Patient reports he would like to be independent, move better, feed self and not have tremors.    Currently in Pain?  No/denies    Multiple Pain Sites  No         [facility] OT Assessment - 10/24/17 0659      Assessment   Medical  Diagnosis  Parkinson's     Referring Provider  [name], H    Hand Dominance  Right    Prior Therapy  none      Precautions   Precautions  Fall      Balance Screen   Has the patient fallen in the past 6 months  Yes    How many times?  one time he can recall    Has the patient had a decrease in activity level because of a fear of falling?   No    Is the patient reluctant to leave their home because of a fear of falling?   No      Home  Environment   Family/patient expects to be discharged to:  Private residence    Living Arrangements  Alone    Available Help at Discharge  Family    Type of [place]  One level    Alternate Level Stairs - Number of Steps  2 steps to enter    Bathroom Shower/Tub  Tub/Shower unit;Curtain    Shower/tub characteristics  Chiropractor  Additional Comments  He reports difficulty with managing buttons, uses electric toothbrush and can shave self with increased time. He has difficulty with socks at times. For homemaking tasks he can help with washing dishes, sweeping and light meal preparation. He feels he is most limited by his tremors.  Patient has difficulty with cutting meat.     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  Patient has  increased difficulty with handwriting with decreased legibility.       IADL   Prior Level of Function Shopping  independent    Shopping  Shops independently for small purchases    Prior Level of Function Light Housekeeping  independent    Light Housekeeping  Performs light daily tasks such as dishwashing, bed making    Prior Level of Function Meal Prep  independent    Meal Prep  Able to complete simple warm meal prep    Prior Level of Function Metallurgist own vehicle    Prior Level of Function Medication Managment  independent    Medication Management  Has difficulty remembering to take medication    Prior Level of Function Financial Management  independent    Financial Management  Requires assistance      Mobility   Mobility Status  Freezing;History of falls    Mobility Status Comments  occasional hesitations      Written Expression   Dominant Hand  Right    Handwriting  50% legible      Vision - History   Baseline Vision  Wears glasses all the time    Visual History  Cataracts      Cognition   Overall Cognitive Status  History of cognitive impairments - at baseline has difficulty remembering to take medication at times.     Memory  Impaired    Memory Impairment  Decreased short term memory      Posture/Postural Control   Posture Comments  Patient with forwards flexed posture, tends to keep head down and eye gaze to the floor most times.       Sensation   Light Touch  Appears Intact    Stereognosis  Appears Intact    Hot/Cold  Appears Intact    Proprioception  Appears Intact    Additional Comments  Patient reports occasional numbness in hands and feet but comes and goes.       Coordination   Gross Motor Movements are Fluid and Coordinated  No    Fine Motor Movements are Fluid and Coordinated  No    Finger Nose Finger Test  impaired  9 Hole Peg Test  Right;Left    Right 9 Hole Peg Test  45 sec    Left 9 Hole Peg Test   27 sec      ROM / Strength   AROM / PROM / Strength  AROM;Strength      AROM   Overall AROM   Within functional limits for tasks performed      Strength   Overall Strength  Deficits    Overall Strength Comments  Decreased UE/LE strength 4/5 overall, decreased grip strength on right compared to left.       Hand Function   Right Hand Grip (lbs)  50    Right Hand Lateral Pinch  17 lbs    Right Hand 3 Point Pinch  15 lbs    Left Hand Grip (lbs)  62    Left Hand Lateral Pinch  21 lbs    Left 3 point pinch  17 lbs               OT Treatments/Exercises (OP) - 10/25/17 0940      Neurological Re-education Exercises   Other Exercises 1  Patient seen for initial instruction of LSVT BIG exercises: LSVT Daily Session Maximal Daily Exercises: Sustained movements are designed to rescale the amplitude of movement output for generalization to daily functional activities. Performed as follows for 1 set of 10 repetitions each: Multi directional sustained movements- 1) Floor to ceiling, 2) Side to side. Multi directional Repetitive movements performed in standing and are designed to provide retraining effort needed for sustained muscle activation in tasks Performed as follows: 3) Step and reach forward, 4) Step and Reach Backwards, 5) Step and reach sideways, 6) Rock and reach forward/backward, 7) Rock and reach sideways. Requires CGA to minimal assist for all exercises in standing with Cues for arm positioning during exercises.    Other Exercises 2  Functional mobility for 2 trials of 400 feet with short rest breaks as needed, cues for amplitude of gait, with emphasis on step height and length. Added posture exercises this date for erect posture, head up with shoulders back during exercises and functional mobility             OT Education - 10/25/17 0938    Education provided  Yes    Education Details  daily exercises, size of step length and height    Person(s) Educated  Patient     Methods  Explanation;Demonstration;Verbal cues    Comprehension  Verbal cues required;Returned demonstration;Verbalized understanding          OT Long Term Goals - 10/25/17 0714      OT LONG TERM GOAL #1   Title  Patient will complete HEP for maximal daily exercises with modified independence in 4 weeks      Baseline  no current home program    Time  4    Period  Weeks    Status  New    Target Date  11/21/17      OT LONG TERM GOAL #2   Title  Patient will improve gait speed and endurance and be able to walk 1650 feet in 6 minutes with improved posture and increased amplitude of gait to negotiate around the home and community safely in 4 weeks.      Baseline  1550 feet at eval    Time  4    Period  Weeks    Status  New    Target Date  11/21/17  OT LONG TERM GOAL #3   Title  Patient will perform sit to stand from lower and unstable surfaces with modified independence.       Baseline  difficulty with lower surfaces    Time  4    Period  Weeks    Status  New    Target Date  11/21/17      OT LONG TERM GOAL #4   Title  Patient will complete handwriting with name and address with 75% legibility.     Baseline  50% legibility at eval    Time  4    Period  Weeks    Status  New      OT LONG TERM GOAL #5   Title  Patient will complete sock donning with modified independence consistently.     Baseline  difficulty with donning socks at times.     Time  4    Period  Weeks    Status  New    Target Date  11/21/17            Plan - 10/25/17 6629    Clinical Impression Statement  Patient reports some soreness this date from starting exercises yesterday but able to participate without difficulty this date.  Continues to require assistance and cues for performance in maximal daily exercises as well as cues for amplitude of gait.  Patient tends to shorten right side step length and shuffles foot especially when distracted.  Continue to work towards goals.     Occupational  Profile and client history currently impacting functional performance  progression of Parkinson's disease, decreased balance, increased right hand tremors, increased difficulty with self care tasks, difficulty walking    Occupational performance deficits (Please refer to evaluation for details):  ADL's;IADL's;Leisure;Social Participation;Rest and Sleep    Rehab Potential  Good    Current Impairments/barriers affecting progress:  progressive disease, balance, posture, anxiety, memory    OT Frequency  4x / week    OT Duration  4 weeks    OT Treatment/Interventions  Self-care/ADL training;Moist Heat;DME and/or AE instruction;Balance training;Gait Training;Therapeutic activities;Therapeutic exercise;Stair Training;Cognitive remediation/compensation;Neuromuscular education;Functional Mobility Training;Patient/family education    Consulted and Agree with Plan of Care  Patient       Patient will benefit from skilled therapeutic intervention in order to improve the following deficits and impairments:  Abnormal gait, Decreased cognition, Decreased knowledge of use of DME, Decreased coordination, Decreased mobility, Improper body mechanics, Decreased activity tolerance, Decreased endurance, Decreased strength, Decreased balance, Difficulty walking, Impaired UE functional use  Visit Diagnosis: Difficulty in walking, not elsewhere classified  Muscle weakness (generalized)  Other lack of coordination  Unsteadiness on feet    Problem List Patient Active Problem List   Diagnosis Date Noted  . Adenoma of large intestine 02/01/2016  . Clinical depression 02/01/2016  . 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 Benign essential HTN 02/01/2016  . 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/2016  . Pure hypercholesterolemia 02/01/2016  . Type 2 diabetes mellitus ([place]) 01/10/2016  . Microalbuminuria 01/10/2016  . Degeneration of intervertebral disc of lumbar region 07/13/2015  . Parkinson's  disease ([place]) 11/16/2012   Amy T Tomasita Morrow, OTR/L, CLT  Lovett,Amy 10/25/2017, 9:45 AM  [place] MAIN [facility] SERVICES [address] [place], [place], [address] Phone: [phone]   Fax:  [phone]  Name: Randall [name] MRN: [id number] Date of Birth: 13-Jul-1951

## 2017-10-27 ENCOUNTER — Ambulatory Visit: Payer: PPO | Admitting: Speech Pathology

## 2017-10-27 ENCOUNTER — Encounter: Payer: Self-pay | Admitting: Speech Pathology

## 2017-10-27 ENCOUNTER — Ambulatory Visit: Payer: PPO | Admitting: Occupational Therapy

## 2017-10-27 DIAGNOSIS — R262 Difficulty in walking, not elsewhere classified: Secondary | ICD-10-CM | POA: Diagnosis not present

## 2017-10-27 DIAGNOSIS — R2681 Unsteadiness on feet: Secondary | ICD-10-CM

## 2017-10-27 DIAGNOSIS — R278 Other lack of coordination: Secondary | ICD-10-CM

## 2017-10-27 DIAGNOSIS — M6281 Muscle weakness (generalized): Secondary | ICD-10-CM

## 2017-10-27 DIAGNOSIS — R490 Dysphonia: Secondary | ICD-10-CM

## 2017-10-27 NOTE — Therapy (Signed)
[place] MAIN [facility] SERVICES [address] [place], [place], [address] Phone: [phone]   Fax:  [phone]  Speech Language Pathology Treatment  Patient Details  Name: Randall [name] MRN: [id number] Date of Birth: 1951/03/09 Referring Provider: Dr. [name]   Encounter Date: 10/27/2017  End of Session - 10/27/17 1520    Visit Number  6    Number of Visits  17    Date for SLP Re-Evaluation  11/14/17    SLP Start Time  1400    SLP Stop Time   1452    SLP Time Calculation (min)  52 min    Activity Tolerance  Patient tolerated treatment well       Past Medical History:  Diagnosis Date  . Allergic   . Colon adenoma   . Depression   . Diabetes mellitus, type II ([place])   . Diverticulosis   . Hypertension   . Seizures ([place])   . Sleep apnea   . Urinary hesitancy     Past Surgical History:  Procedure Laterality Date  . COLON SURGERY    . HERNIA REPAIR    . UVULOPALATOPHARYNGOPLASTY, TONSILLECTOMY AND SEPTOPLASTY      There were no vitals filed for this visit.  Subjective Assessment - 10/27/17 1520    Subjective  Patient reports that he was sick over the weekend            ADULT SLP TREATMENT - 10/27/17 0001      General Information   Behavior/Cognition  Alert;Cooperative;Pleasant mood    HPI   67 year old man diagnosed with Parkinson's disease in 2016.  Patient states that he went through the LSVT LOUD and BIG in 2016.       Treatment Provided   Treatment provided  Cognitive-Linquistic      Pain Assessment   Pain Assessment  No/denies pain      Cognitive-Linquistic Treatment   Treatment focused on  Voice    Skilled Treatment  Daily Task #1 (Maximum sustained "ah"): Average 10 seconds, 85 dB. Daily Task 2 (Maximum fundamental frequency range): Highs: 15 high pitched "ah" given min cues. Lows: 15 low pitched "ah" given min cues. Daily task #3 (Maximum speech loudness drill of functional phrases): Average 75 dB.  Hierarchal  speech loudness drill: Read paragraphs, varies between 70 dB and 77 dB.  Generate short responses, 72 dB.  Homework: assignments not completed due to illness.  Off the cuff remarks: average 68 dB, improves to 73 dB with SLP cues.      Assessment / Recommendations / Plan   Plan  Continue with current plan of care      Progression Toward Goals   Progression toward goals  Progressing toward goals       SLP Education - 10/27/17 1520    Education provided  Yes    Education Details  LSVT-LOUD    Person(s) Educated  Patient    Methods  Explanation    Comprehension  Verbalized understanding         SLP Long Term Goals - 10/13/17 1539      SLP LONG TERM GOAL #1   Title  The patient will complete Daily Tasks (Maximum duration "ah", High/Lows, and Functional Phrases) at average loudness of 80 dB and with loud, good quality voice.     Time  4    Period  Weeks    Status  New    Target Date  11/14/17  SLP LONG TERM GOAL #2   Title  The patient will complete Hierarchal Speech Loudness reading drills (words/phrases, sentences, and paragraph) at average 75 dB and with loud, good quality voice.      Time  4    Period  Weeks    Status  New    Target Date  11/14/17      SLP LONG TERM GOAL #3   Title  The patient will participate in conversation, maintaining average loudness of 75 dB and loud, good quality voice.    Time  4    Period  Weeks    Status  New    Target Date  11/14/17      SLP LONG TERM GOAL #4   Title  The patient will complete homework daily.    Time  4    Period  Weeks    Status  New    Target Date  11/14/17       Plan - 10/27/17 1521    Clinical Impression Statement  The patient is completing daily tasks and hierarchal speech drill tasks with loud, good quality voice given fewer SLP cues.      Speech Therapy Frequency  4x / week    Duration  4 weeks    Treatment/Interventions  Patient/family education;Other (comment) LSVT-LOUD    Potential to Achieve Goals   Good    Potential Considerations  Ability to learn/carryover information;Severity of impairments;Co-morbidities;Cooperation/participation level;Family/community support;Medical prognosis;Previous level of function    SLP Home Exercise Plan  LSVT-LOUD daily homework    Consulted and Agree with Plan of Care  Patient       Patient will benefit from skilled therapeutic intervention in order to improve the following deficits and impairments:   Dysphonia    Problem List Patient Active Problem List   Diagnosis Date Noted  . Adenoma of large intestine 02/01/2016  . Clinical depression 02/01/2016  . 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 Benign essential HTN 02/01/2016  . 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/2016  . Pure hypercholesterolemia 02/01/2016  . Type 2 diabetes mellitus ([place]) 01/10/2016  . Microalbuminuria 01/10/2016  . Degeneration of intervertebral disc of lumbar region 07/13/2015  . Parkinson's disease ([place]) 11/16/2012   [name], MS/CCC- SLP  [name] 10/27/2017, Jeanella Cara PM  [place] MAIN [facility] SERVICES [address] [place], [place], [address] Phone: [phone]   Fax:  [phone]   Name: ERIN UECKER MRN: [id number] Date of Birth: 15-Jun-1951

## 2017-10-28 ENCOUNTER — Ambulatory Visit: Payer: PPO | Admitting: Occupational Therapy

## 2017-10-28 ENCOUNTER — Ambulatory Visit: Payer: PPO | Admitting: Speech Pathology

## 2017-10-28 ENCOUNTER — Encounter: Payer: Self-pay | Admitting: Speech Pathology

## 2017-10-28 DIAGNOSIS — R262 Difficulty in walking, not elsewhere classified: Secondary | ICD-10-CM | POA: Diagnosis not present

## 2017-10-28 DIAGNOSIS — R278 Other lack of coordination: Secondary | ICD-10-CM

## 2017-10-28 DIAGNOSIS — R2681 Unsteadiness on feet: Secondary | ICD-10-CM

## 2017-10-28 DIAGNOSIS — R490 Dysphonia: Secondary | ICD-10-CM

## 2017-10-28 DIAGNOSIS — M6281 Muscle weakness (generalized): Secondary | ICD-10-CM

## 2017-10-28 NOTE — Therapy (Signed)
[place] MAIN [facility] SERVICES [address] [place], [place], [address] Phone: [phone]   Fax:  [phone]  Speech Language Pathology Treatment  Patient Details  Name: Randall [name] MRN: [id number] Date of Birth: [birth year] Referring Provider: Dr. [name]   Encounter Date: 10/28/2017  End of Session - 10/28/17 1456    Visit Number  7    Number of Visits  17    Date for SLP Re-Evaluation  11/14/17    SLP Start Time  1400    SLP Stop Time   1453    SLP Time Calculation (min)  53 min    Activity Tolerance  Patient tolerated treatment well       Past Medical History:  Diagnosis Date  . Allergic   . Colon adenoma   . Depression   . Diabetes mellitus, type II ([place])   . Diverticulosis   . Hypertension   . Seizures ([place])   . Sleep apnea   . Urinary hesitancy     Past Surgical History:  Procedure Laterality Date  . COLON SURGERY    . HERNIA REPAIR    . UVULOPALATOPHARYNGOPLASTY, TONSILLECTOMY AND SEPTOPLASTY      There were no vitals filed for this visit.  Subjective Assessment - 10/28/17 1455    Subjective  "I'm trying"    Currently in Pain?  No/denies            ADULT SLP TREATMENT - 10/28/17 0001      General Information   Behavior/Cognition  Alert;Cooperative;Pleasant mood    HPI   67 year old man diagnosed with Parkinson's disease in 2016.  Patient states that he went through the LSVT LOUD and BIG in 2016.       Treatment Provided   Treatment provided  Cognitive-Linquistic      Pain Assessment   Pain Assessment  No/denies pain      Cognitive-Linquistic Treatment   Treatment focused on  Voice    Skilled Treatment  Daily Task #1 (Maximum sustained "ah"): Average 13 seconds, 85 dB. Daily Task 2 (Maximum fundamental frequency range): Highs: 15 high pitched "ah" given min cues. Lows: 15 low pitched "ah" given min cues. Daily task #3 (Maximum speech loudness drill of functional phrases): Average 75 dB.  Hierarchal  speech loudness drill: Read paragraphs, varies between 70 dB and 77 dB.  Generate short responses, 72 dB.  Homework: assignments completed.  Off the cuff remarks: average 70 dB, improves to 75 dB with SLP cues.      Assessment / Recommendations / Plan   Plan  Continue with current plan of care      Progression Toward Goals   Progression toward goals  Progressing toward goals       SLP Education - 10/28/17 1456    Education provided  Yes    Education Details  LSVT-LOUD    Person(s) Educated  Patient    Methods  Explanation    Comprehension  Verbalized understanding         SLP Long Term Goals - 10/13/17 1539      SLP LONG TERM GOAL #1   Title  The patient will complete Daily Tasks (Maximum duration "ah", High/Lows, and Functional Phrases) at average loudness of 80 dB and with loud, good quality voice.     Time  4    Period  Weeks    Status  New    Target Date  11/14/17      SLP  LONG TERM GOAL #2   Title  The patient will complete Hierarchal Speech Loudness reading drills (words/phrases, sentences, and paragraph) at average 75 dB and with loud, good quality voice.      Time  4    Period  Weeks    Status  New    Target Date  11/14/17      SLP LONG TERM GOAL #3   Title  The patient will participate in conversation, maintaining average loudness of 75 dB and loud, good quality voice.    Time  4    Period  Weeks    Status  New    Target Date  11/14/17      SLP LONG TERM GOAL #4   Title  The patient will complete homework daily.    Time  4    Period  Weeks    Status  New    Target Date  11/14/17       Plan - 10/28/17 1457    Clinical Impression Statement  The patient is completing daily tasks and hierarchal speech drill tasks with loud, good quality voice given fewer SLP cues.      Speech Therapy Frequency  4x / week    Duration  4 weeks    Treatment/Interventions  Patient/family education;Other (comment) LSVT-LOUD    Potential to Achieve Goals  Good    Potential  Considerations  Ability to learn/carryover information;Severity of impairments;Co-morbidities;Cooperation/participation level;Family/community support;Medical prognosis;Previous level of function    SLP Home Exercise Plan  LSVT-LOUD daily homework    Consulted and Agree with Plan of Care  Patient       Patient will benefit from skilled therapeutic intervention in order to improve the following deficits and impairments:   Dysphonia    Problem List Patient Active Problem List   Diagnosis Date Noted  . Adenoma of large intestine 02/01/2016  . Clinical depression 02/01/2016  . 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 Benign essential HTN 02/01/2016  . 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/2016  . Pure hypercholesterolemia 02/01/2016  . Type 2 diabetes mellitus ([place]) 01/10/2016  . Microalbuminuria 01/10/2016  . Degeneration of intervertebral disc of lumbar region 07/13/2015  . Parkinson's disease ([place]) 11/16/2012   [name], MS/CCC- SLP  [name] 10/28/2017, 2:57 PM  [place] MAIN [facility] SERVICES [address] [place] [address] [place], [place], [address] Phone: [phone]   Fax:  [phone]   Name: Randall [name] MRN: [id number] Date of Birth: 03/24/1951

## 2017-10-29 ENCOUNTER — Ambulatory Visit: Payer: PPO | Admitting: Speech Pathology

## 2017-10-29 ENCOUNTER — Ambulatory Visit: Payer: PPO | Admitting: Occupational Therapy

## 2017-10-30 ENCOUNTER — Ambulatory Visit: Payer: PPO | Admitting: Speech Pathology

## 2017-10-30 ENCOUNTER — Encounter: Payer: Self-pay | Admitting: Speech Pathology

## 2017-10-30 ENCOUNTER — Ambulatory Visit: Payer: PPO | Admitting: Occupational Therapy

## 2017-10-30 DIAGNOSIS — M6281 Muscle weakness (generalized): Secondary | ICD-10-CM

## 2017-10-30 DIAGNOSIS — R278 Other lack of coordination: Secondary | ICD-10-CM

## 2017-10-30 DIAGNOSIS — R2681 Unsteadiness on feet: Secondary | ICD-10-CM

## 2017-10-30 DIAGNOSIS — R490 Dysphonia: Secondary | ICD-10-CM

## 2017-10-30 DIAGNOSIS — R262 Difficulty in walking, not elsewhere classified: Secondary | ICD-10-CM

## 2017-10-30 NOTE — Therapy (Signed)
[place] MAIN [facility] SERVICES [address] [place], [place], [address] Phone: [phone]   Fax:  [phone]  Speech Language Pathology Treatment  Patient Details  Name: Randall [name] MRN: [id number] Date of Birth: 03-10-1951 Referring Provider: Dr. [name]   Encounter Date: 10/30/2017  End of Session - 10/30/17 1500    Visit Number  8    Number of Visits  17    Date for SLP Re-Evaluation  11/14/17    SLP Start Time  1400    SLP Stop Time   1452    SLP Time Calculation (min)  52 min    Activity Tolerance  Patient tolerated treatment well       Past Medical History:  Diagnosis Date  . Allergic   . Colon adenoma   . Depression   . Diabetes mellitus, type II ([place])   . Diverticulosis   . Hypertension   . Seizures ([place])   . Sleep apnea   . Urinary hesitancy     Past Surgical History:  Procedure Laterality Date  . COLON SURGERY    . HERNIA REPAIR    . UVULOPALATOPHARYNGOPLASTY, TONSILLECTOMY AND SEPTOPLASTY      There were no vitals filed for this visit.  Subjective Assessment - 10/30/17 1459    Subjective  "I'm trying"            ADULT SLP TREATMENT - 10/30/17 0001      General Information   Behavior/Cognition  Alert;Cooperative;Pleasant mood    HPI   67 year old man diagnosed with Parkinson's disease in 2016.  Patient states that he went through the LSVT LOUD and BIG in 2016.       Treatment Provided   Treatment provided  Cognitive-Linquistic      Pain Assessment   Pain Assessment  No/denies pain      Cognitive-Linquistic Treatment   Treatment focused on  Voice    Skilled Treatment  Daily Task #1 (Maximum sustained "ah"): Average 15 seconds, 85 dB. Daily Task 2 (Maximum fundamental frequency range): Highs: 15 high pitched "ah" given min cues. Lows: 15 low pitched "ah" given min cues. Daily task #3 (Maximum speech loudness drill of functional phrases): Average 75 dB.  Hierarchal speech loudness drill: Read  paragraphs, varies between 70 dB and 77 dB.  Generate short responses, 72 dB.  Homework: assignments completed.  Off the cuff remarks: average 70 dB, improves to 75 dB with SLP cues.      Assessment / Recommendations / Plan   Plan  Continue with current plan of care      Progression Toward Goals   Progression toward goals  Progressing toward goals       SLP Education - 10/30/17 1459    Education provided  Yes    Education Details  LSVT-LOUD    Person(s) Educated  Patient    Methods  Explanation    Comprehension  Verbalized understanding         SLP Long Term Goals - 10/13/17 1539      SLP LONG TERM GOAL #1   Title  The patient will complete Daily Tasks (Maximum duration "ah", High/Lows, and Functional Phrases) at average loudness of 80 dB and with loud, good quality voice.     Time  4    Period  Weeks    Status  New    Target Date  11/14/17      SLP LONG TERM GOAL #2   Title  The patient will complete Hierarchal Speech Loudness reading drills (words/phrases, sentences, and paragraph) at average 75 dB and with loud, good quality voice.      Time  4    Period  Weeks    Status  New    Target Date  11/14/17      SLP LONG TERM GOAL #3   Title  The patient will participate in conversation, maintaining average loudness of 75 dB and loud, good quality voice.    Time  4    Period  Weeks    Status  New    Target Date  11/14/17      SLP LONG TERM GOAL #4   Title  The patient will complete homework daily.    Time  4    Period  Weeks    Status  New    Target Date  11/14/17       Plan - 10/30/17 1500    Clinical Impression Statement  The patient is completing daily tasks and hierarchal speech drill tasks with loud, good quality voice given fewer SLP cues.      Speech Therapy Frequency  4x / week    Duration  4 weeks    Treatment/Interventions  Patient/family education;Other (comment) LSVT-LOUD    Potential Considerations  Ability to learn/carryover information;Severity of  impairments;Co-morbidities;Cooperation/participation level;Family/community support;Medical prognosis;Previous level of function    SLP Home Exercise Plan  LSVT-LOUD daily homework    Consulted and Agree with Plan of Care  Patient       Patient will benefit from skilled therapeutic intervention in order to improve the following deficits and impairments:   Dysphonia    Problem List Patient Active Problem List   Diagnosis Date Noted  . Adenoma of large intestine 02/01/2016  . Clinical depression 02/01/2016  . 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 Benign essential HTN 02/01/2016  . 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/2016  . Pure hypercholesterolemia 02/01/2016  . Type 2 diabetes mellitus ([place]) 01/10/2016  . Microalbuminuria 01/10/2016  . Degeneration of intervertebral disc of lumbar region 07/13/2015  . Parkinson's disease ([place]) 11/16/2012   [name], MS/CCC- SLP  [name] 10/30/2017, 3:01 PM  [place] MAIN [facility] SERVICES [address] [place], [place], [address] Phone: [phone]   Fax:  [phone]   Name: Randall [name] MRN: [id number] Date of Birth: [birth year]

## 2017-11-02 ENCOUNTER — Encounter: Payer: Self-pay | Admitting: Occupational Therapy

## 2017-11-02 NOTE — Therapy (Signed)
[place] MAIN [facility] SERVICES [address] [place], [place], [address] Phone: [phone]   Fax:  [phone]  Occupational Therapy Treatment  Patient Details  Name: Randall [name] MRN: [id number] Date of Birth: [birth year] Referring Provider: Joselyn Arrow   Encounter Date: 10/28/2017  OT End of Session - 11/02/17 2041    Visit Number  7    Number of Visits  17    Date for OT Re-Evaluation  11/21/17    OT Start Time  1300    OT Stop Time  1400    OT Time Calculation (min)  60 min    Activity Tolerance  Patient tolerated treatment well    Behavior During Therapy  [facility] for tasks assessed/performed       Past Medical History:  Diagnosis Date  . Allergic   . Colon adenoma   . Depression   . Diabetes mellitus, type II ([place])   . Diverticulosis   . Hypertension   . Seizures ([place])   . Sleep apnea   . Urinary hesitancy     Past Surgical History:  Procedure Laterality Date  . COLON SURGERY    . HERNIA REPAIR    . UVULOPALATOPHARYNGOPLASTY, TONSILLECTOMY AND SEPTOPLASTY      There were no vitals filed for this visit.  Subjective Assessment - 11/02/17 2039    Subjective   Patient states he feels a bit better today and is back on track more with his medication    Pertinent History  Patient was diagnosed with Parkinson's about 8 years ago, he reports a recent increase in tremors in right hand especially when he feels stressed or cold (which is often).  Patient has had increased difficulty with walking, increased foot drag on the right, no recent falls. Increased difficulty in recent months with self care tasks.     Patient Stated Goals  Patient reports he would like to be independent, move better, feed self and not have tremors.    Currently in Pain?  No/denies    Multiple Pain Sites  No                   OT Treatments/Exercises (OP) - 11/02/17 2040      ADLs   ADL Comments  Functional component tasks as follows:  1)  sit to stand  from chair without the use of arms, 5 repetitions and cues for technique, 2)  crossing legs to reach to put on socks, 3) managing buttons with implementation of hand flicks prior to buttons, 4)  handwriting with larger lined paper, 5) reciprocal arm and stepping patterns.      Neurological Re-education Exercises   Other Exercises 1  Patient seen for instruction of LSVT BIG exercises: LSVT Daily Session Maximal Daily Exercises: Sustained movements are designed to rescale the amplitude of movement output for generalization to daily functional activities. Performed as follows for 1 set of 10 repetitions each: Multi directional sustained movements- 1) Floor to ceiling, 2) Side to side. Multi directional Repetitive movements performed in standing and are designed to provide retraining effort needed for sustained muscle activation in tasks Performed as follows: 3) Step and reach forward, 4) Step and Reach Backwards, 5) Step and reach sideways, 6) Rock and reach forward/backward, 7) Rock and reach sideways. Requires CGA to minimal assist for all exercises in standing with Cues for arm positioning during exercises.    Other Exercises 2  Functional mobility skills for 2 trials of 400 feet, indoors  on flat surfaces, cues for turning behaviors and for amplitude of gait as well as occasional Cues for reciprocal arm swing.  Patient instructed on heel to toe strike on the right LLE and able to demonstrate with cues but will need to focus on this skill to become more consistent with his gait patterns.   Balance activities in standing with use of balance pad, tossing balls into container with CGA for balance.  Patient also seen for balance and body mechanics with picking balls up off the floor to place into basket.             OT Education - 11/02/17 2040    Education provided  Yes    Education Details  daily exercises, gait patterns    Person(s) Educated  Patient    Methods  Explanation;Demonstration;Verbal cues     Comprehension  Verbal cues required;Returned demonstration;Verbalized understanding          OT Long Term Goals - 11/02/17 2042      OT LONG TERM GOAL #1   Title  Patient will complete HEP for maximal daily exercises with modified independence in 4 weeks      Baseline  no current home program    Time  4    Period  Weeks    Status  On-going      OT LONG TERM GOAL #2   Title  Patient will improve gait speed and endurance and be able to walk 1650 feet in 6 minutes with improved posture and increased amplitude of gait to negotiate around the home and community safely in 4 weeks.      Baseline  1550 feet at eval    Time  4    Period  Weeks    Status  On-going      OT LONG TERM GOAL #3   Title  Patient will perform sit to stand from lower and unstable surfaces with modified independence.       Baseline  difficulty with lower surfaces    Time  4    Period  Weeks    Status  On-going      OT LONG TERM GOAL #4   Title  Patient will complete handwriting with name and address with 75% legibility.     Baseline  50% legibility at eval    Time  4    Period  Weeks    Status  On-going      OT LONG TERM GOAL #5   Title  Patient will complete sock donning with modified independence consistently.     Baseline  difficulty with donning socks at times.     Time  4    Period  Weeks    Status  On-going            Plan - 11/02/17 2041    Clinical Impression Statement  Patient tends to step on the right with a shortened step pattern often without a heel strike and weight shifted more to the toe part of his foot.  When cued, he can often demonstrate and correct this but will revert back to old pattern if distracted or if engaged in a conversation.  Will continue to reinforce a heel to toe gait pattern with increased repetition to drive motor learning to become more habitual in nature.  Continued cues for amplitude of gait.     Occupational Profile and client history currently impacting  functional performance  progression of Parkinson's disease, decreased balance, increased right hand  tremors, increased difficulty with self care tasks, difficulty walking    Occupational performance deficits (Please refer to evaluation for details):  ADL's;IADL's;Leisure;Social Participation;Rest and Sleep    Rehab Potential  Good    Current Impairments/barriers affecting progress:  progressive disease, balance, posture, anxiety, memory    OT Frequency  4x / week    OT Duration  4 weeks    OT Treatment/Interventions  Self-care/ADL training;Moist Heat;DME and/or AE instruction;Balance training;Gait Training;Therapeutic activities;Therapeutic exercise;Stair Training;Cognitive remediation/compensation;Neuromuscular education;Functional Mobility Training;Patient/family education    Consulted and Agree with Plan of Care  Patient       Patient will benefit from skilled therapeutic intervention in order to improve the following deficits and impairments:  Abnormal gait, Decreased cognition, Decreased knowledge of use of DME, Decreased coordination, Decreased mobility, Improper body mechanics, Decreased activity tolerance, Decreased endurance, Decreased strength, Decreased balance, Difficulty walking, Impaired UE functional use  Visit Diagnosis: Muscle weakness (generalized)  Difficulty in walking, not elsewhere classified  Other lack of coordination  Unsteadiness on feet    Problem List Patient Active Problem List   Diagnosis Date Noted  . Adenoma of large intestine 02/01/2016  . Clinical depression 02/01/2016  . 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 Benign essential HTN 02/01/2016  . 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/2016  . Pure hypercholesterolemia 02/01/2016  . Type 2 diabetes mellitus ([place]) 01/10/2016  . Microalbuminuria 01/10/2016  . Degeneration of intervertebral disc of lumbar region 07/13/2015  . Parkinson's disease ([place]) 11/16/2012   Mafalda Mcginniss T Tomasita Morrow, OTR/L,  CLT  Gurney Balthazor 11/02/2017, 8:43 PM  [place] MAIN [facility] SERVICES [address] [place], [place], [address] Phone: [phone]   Fax:  [phone]  Name: Randall [name] MRN: [id number] Date of Birth: 1951-04-16

## 2017-11-02 NOTE — Therapy (Signed)
[place] MAIN [facility] SERVICES [address] [place], [place], [address] Phone: [phone]   Fax:  [phone]  Occupational Therapy Treatment  Patient Details  Name: Randall [name] MRN: [id number] Date of Birth: 1951/06/14 Referring Provider: Joselyn Arrow   Encounter Date: 10/27/2017  OT End of Session - 11/02/17 2032    Visit Number  6    Number of Visits  17    Date for OT Re-Evaluation  11/21/17    OT Start Time  1300    OT Stop Time  1355    OT Time Calculation (min)  55 min    Activity Tolerance  Patient tolerated treatment well    Behavior During Therapy  [facility] for tasks assessed/performed       Past Medical History:  Diagnosis Date  . Allergic   . Colon adenoma   . Depression   . Diabetes mellitus, type II ([place])   . Diverticulosis   . Hypertension   . Seizures ([place])   . Sleep apnea   . Urinary hesitancy     Past Surgical History:  Procedure Laterality Date  . COLON SURGERY    . HERNIA REPAIR    . UVULOPALATOPHARYNGOPLASTY, TONSILLECTOMY AND SEPTOPLASTY      There were no vitals filed for this visit.  Subjective Assessment - 11/02/17 2029    Subjective   Patient reports he is trying exercises at home but did not feel well over the weekend so he did not perform them 2 times a day.  He reports he was not able to take his medication at times and could not do much, rested most of the weekend.     Pertinent History  Patient was diagnosed with Parkinson's about 8 years ago, he reports a recent increase in tremors in right hand especially when he feels stressed or cold (which is often).  Patient has had increased difficulty with walking, increased foot drag on the right, no recent falls. Increased difficulty in recent months with self care tasks.     Patient Stated Goals  Patient reports he would like to be independent, move better, feed self and not have tremors.    Currently in Pain?  No/denies    Multiple Pain Sites  No                    OT Treatments/Exercises (OP) - 11/02/17 2030      ADLs   ADL Comments  Functional component tasks as follows:  1)  sit to stand from chair without the use of arms, 5 repetitions and cues for technique, 2)  crossing legs to reach to put on socks, 3) managing buttons with implementation of hand flicks prior to buttons, 4)  handwriting with larger lined paper, 5) reciprocal arm and stepping patterns.      Neurological Re-education Exercises   Other Exercises 1  Patient seen for instruction of LSVT BIG exercises: LSVT Daily Session Maximal Daily Exercises: Sustained movements are designed to rescale the amplitude of movement output for generalization to daily functional activities. Performed as follows for 1 set of 10 repetitions each: Multi directional sustained movements- 1) Floor to ceiling, 2) Side to side. Multi directional Repetitive movements performed in standing and are designed to provide retraining effort needed for sustained muscle activation in tasks Performed as follows: 3) Step and reach forward, 4) Step and Reach Backwards, 5) Step and reach sideways, 6) Rock and reach forward/backward, 7) Rock and reach sideways. Requires  CGA to minimal assist for all exercises in standing with Cues for arm positioning during exercises.    Other Exercises 2  Patient continues to work towards consistency towards normal gait patterns on even surfaces, weather has not permitted to ambulate outdoors in more challenging environment this week. Patient continues to require cues and instruction on strategies to detect when he is performing movement patterns incorrectly and how to change pattern to produce greater amplitude of movement as well as heel to toe gait pattern. Focus on handwriting and manging buttons with use of weight bearing and hand flicks prior to task.               OT Education - 11/02/17 2031    Education provided  Yes    Education Details  LSVT BIG exercises,  amplitude of steps, heel to toe patterns    Person(s) Educated  Patient    Methods  Explanation;Demonstration;Verbal cues    Comprehension  Verbal cues required;Returned demonstration;Verbalized understanding          OT Long Term Goals - 10/25/17 0959      OT LONG TERM GOAL #1   Title  Patient will complete HEP for maximal daily exercises with modified independence in 4 weeks      Baseline  no current home program    Time  4    Period  Weeks    Status  On-going      OT LONG TERM GOAL #2   Title  Patient will improve gait speed and endurance and be able to walk 1650 feet in 6 minutes with improved posture and increased amplitude of gait to negotiate around the home and community safely in 4 weeks.      Baseline  1550 feet at eval    Time  4    Period  Weeks    Status  On-going      OT LONG TERM GOAL #3   Title  Patient will perform sit to stand from lower and unstable surfaces with modified independence.       Baseline  difficulty with lower surfaces    Time  4    Period  Weeks    Status  On-going      OT LONG TERM GOAL #4   Title  Patient will complete handwriting with name and address with 75% legibility.     Baseline  50% legibility at eval    Time  4    Period  Weeks    Status  On-going      OT LONG TERM GOAL #5   Title  Patient will complete sock donning with modified independence consistently.     Baseline  difficulty with donning socks at times.     Time  4    Period  Weeks    Status  On-going            Plan - 11/02/17 2032    Clinical Impression Statement  Patient demonstrating increased tremoring and posturing on the right today.  When instructed to utilize hand flicks prior to handwriting and buttoning, he demonstrated decreased tremoring, when instructed to weight bear onto right UE, his tremors ceased and he was able to engage in managing buttons without difficulty.  Patient continues to work on improving quality of movement and increasing amplitude  of gait during functional mobility tasks.  Cues for posture during exercises and functional mobility skills.      Occupational Profile and client history currently impacting functional  performance  progression of Parkinson's disease, decreased balance, increased right hand tremors, increased difficulty with self care tasks, difficulty walking    Occupational performance deficits (Please refer to evaluation for details):  ADL's;IADL's;Leisure;Social Participation;Rest and Sleep    Rehab Potential  Good    Current Impairments/barriers affecting progress:  progressive disease, balance, posture, anxiety, memory    OT Frequency  4x / week    OT Duration  4 weeks    OT Treatment/Interventions  Self-care/ADL training;Moist Heat;DME and/or AE instruction;Balance training;Gait Training;Therapeutic activities;Therapeutic exercise;Stair Training;Cognitive remediation/compensation;Neuromuscular education;Functional Mobility Training;Patient/family education    Consulted and Agree with Plan of Care  Patient       Patient will benefit from skilled therapeutic intervention in order to improve the following deficits and impairments:  Abnormal gait, Decreased cognition, Decreased knowledge of use of DME, Decreased coordination, Decreased mobility, Improper body mechanics, Decreased activity tolerance, Decreased endurance, Decreased strength, Decreased balance, Difficulty walking, Impaired UE functional use  Visit Diagnosis: Muscle weakness (generalized)  Difficulty in walking, not elsewhere classified  Other lack of coordination  Unsteadiness on feet    Problem List Patient Active Problem List   Diagnosis Date Noted  . Adenoma of large intestine 02/01/2016  . Clinical depression 02/01/2016  . 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 Benign essential HTN 02/01/2016  . 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/2016  . Pure hypercholesterolemia 02/01/2016  . Type 2 diabetes mellitus ([place])  01/10/2016  . Microalbuminuria 01/10/2016  . Degeneration of intervertebral disc of lumbar region 07/13/2015  . Parkinson's disease ([place]) 11/16/2012   Amy T Tomasita Morrow, OTR/L, CLT  [place]ovett,Amy 11/02/2017, 8:33 PM  [place] MAIN [facility] SERVICES [address] [place], [place], [address] Phone: [phone]   Fax:  [phone]  Name: Randall [name] MRN: [id number] Date of Birth: 09/05/1951

## 2017-11-02 NOTE — Therapy (Signed)
[place] MAIN [facility] SERVICES [address] [place], [place], [address] Phone: [phone]   Fax:  [phone]  Occupational Therapy Treatment  Patient Details  Name: Randall [name] MRN: [id number] Date of Birth: November 14, 1950 Referring Provider: Joselyn Arrow   Encounter Date: 10/30/2017  OT End of Session - 11/02/17 2046    Visit Number  8    Number of Visits  17    Date for OT Re-Evaluation  11/21/17    OT Start Time  1301    OT Stop Time  1400    OT Time Calculation (min)  59 min    Activity Tolerance  Patient tolerated treatment well    Behavior During Therapy  [facility] for tasks assessed/performed       Past Medical History:  Diagnosis Date  . Allergic   . Colon adenoma   . Depression   . Diabetes mellitus, type II ([place])   . Diverticulosis   . Hypertension   . Seizures ([place])   . Sleep apnea   . Urinary hesitancy     Past Surgical History:  Procedure Laterality Date  . COLON SURGERY    . HERNIA REPAIR    . UVULOPALATOPHARYNGOPLASTY, TONSILLECTOMY AND SEPTOPLASTY      There were no vitals filed for this visit.  Subjective Assessment - 11/02/17 2044    Subjective   "I am sorry I didn't make it yesterday, there was ice out and I didn't want to take any chances on something happening or falling.      Pertinent History  Patient was diagnosed with Parkinson's about 8 years ago, he reports a recent increase in tremors in right hand especially when he feels stressed or cold (which is often).  Patient has had increased difficulty with walking, increased foot drag on the right, no recent falls. Increased difficulty in recent months with self care tasks.     Patient Stated Goals  Patient reports he would like to be independent, move better, feed self and not have tremors.    Currently in Pain?  No/denies    Multiple Pain Sites  No                   OT Treatments/Exercises (OP) - 11/02/17 2045      ADLs   ADL Comments  Functional  component tasks as follows:  1)  sit to stand from chair without the use of arms, 5 repetitions and cues for technique, 2)  crossing legs to reach to put on socks, 3) managing buttons with implementation of hand flicks prior to buttons, 4)  handwriting with larger lined paper, 5) reciprocal arm and stepping patterns.      Neurological Re-education Exercises   Other Exercises 1  Patient seen for instruction of LSVT BIG exercises: LSVT Daily Session Maximal Daily Exercises: Sustained movements are designed to rescale the amplitude of movement output for generalization to daily functional activities. Performed as follows for 1 set of 10 repetitions each: Multi directional sustained movements- 1) Floor to ceiling, 2) Side to side. Multi directional Repetitive movements performed in standing and are designed to provide retraining effort needed for sustained muscle activation in tasks Performed as follows: 3) Step and reach forward, 4) Step and Reach Backwards, 5) Step and reach sideways, 6) Rock and reach forward/backward, 7) Rock and reach sideways. Requires CGA to minimal assist for all exercises in standing with Cues for arm positioning during exercises.    Other Exercises 2  Patient seen for functional mobility for 2 sets of 500 feet each with cues for reciprocal arm swing, increased amplitude of gait and heel to toe gait patterns on the right.  Patient is beginning to show more signs of being able to detect when gait patterns decrease and able to self correct at times. Balance tasks in standing with use of balance pad and engagement of bilateral UEs into a task involving weight shifting patterns.             OT Education - 11/02/17 2046    Education provided  Yes    Education Details  amplitude of gait, heel to toe patterns    Person(s) Educated  Patient    Methods  Explanation;Demonstration;Verbal cues    Comprehension  Verbal cues required;Returned demonstration;Verbalized understanding           OT Long Term Goals - 11/02/17 2042      OT LONG TERM GOAL #1   Title  Patient will complete HEP for maximal daily exercises with modified independence in 4 weeks      Baseline  no current home program    Time  4    Period  Weeks    Status  On-going      OT LONG TERM GOAL #2   Title  Patient will improve gait speed and endurance and be able to walk 1650 feet in 6 minutes with improved posture and increased amplitude of gait to negotiate around the home and community safely in 4 weeks.      Baseline  1550 feet at eval    Time  4    Period  Weeks    Status  On-going      OT LONG TERM GOAL #3   Title  Patient will perform sit to stand from lower and unstable surfaces with modified independence.       Baseline  difficulty with lower surfaces    Time  4    Period  Weeks    Status  On-going      OT LONG TERM GOAL #4   Title  Patient will complete handwriting with name and address with 75% legibility.     Baseline  50% legibility at eval    Time  4    Period  Weeks    Status  On-going      OT LONG TERM GOAL #5   Title  Patient will complete sock donning with modified independence consistently.     Baseline  difficulty with donning socks at times.     Time  4    Period  Weeks    Status  On-going            Plan - 11/02/17 2046    Clinical Impression Statement  Patient demonstrating improvement in balance with use of balance pad with increased ability to shift weight and utilize bilateral UEs during task with decreased assistance and decreased loss of balance.  Patient continues to focus on consistency of gait during functional mobility and is able to perform greater distances with decreased rest breaks and decreased cues.     Occupational Profile and client history currently impacting functional performance  progression of Parkinson's disease, decreased balance, increased right hand tremors, increased difficulty with self care tasks, difficulty walking     Occupational performance deficits (Please refer to evaluation for details):  ADL's;IADL's;Leisure;Social Participation;Rest and Sleep    Rehab Potential  Good    Current Impairments/barriers affecting progress:  progressive disease,  balance, posture, anxiety, memory    OT Frequency  4x / week    OT Duration  4 weeks    OT Treatment/Interventions  Self-care/ADL training;Moist Heat;DME and/or AE instruction;Balance training;Gait Training;Therapeutic activities;Therapeutic exercise;Stair Training;Cognitive remediation/compensation;Neuromuscular education;Functional Mobility Training;Patient/family education    Consulted and Agree with Plan of Care  Patient       Patient will benefit from skilled therapeutic intervention in order to improve the following deficits and impairments:  Abnormal gait, Decreased cognition, Decreased knowledge of use of DME, Decreased coordination, Decreased mobility, Improper body mechanics, Decreased activity tolerance, Decreased endurance, Decreased strength, Decreased balance, Difficulty walking, Impaired UE functional use  Visit Diagnosis: Muscle weakness (generalized)  Difficulty in walking, not elsewhere classified  Other lack of coordination  Unsteadiness on feet    Problem List Patient Active Problem List   Diagnosis Date Noted  . Adenoma of large intestine 02/01/2016  . Clinical depression 02/01/2016  . 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 Benign essential HTN 02/01/2016  . 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/2016  . Pure hypercholesterolemia 02/01/2016  . Type 2 diabetes mellitus ([place]) 01/10/2016  . Microalbuminuria 01/10/2016  . Degeneration of intervertebral disc of lumbar region 07/13/2015  . Parkinson's disease ([place]) 11/16/2012   Amy T Tomasita Morrow, OTR/L, CLT  Lovett,Amy 11/02/2017, 8:47 PM  [place] MAIN [facility] SERVICES [address] [place], [place], [address] Phone: [phone]    Fax:  [phone]  Name: Randall [name] MRN: [id number] Date of Birth: 11-13-1950

## 2017-11-03 ENCOUNTER — Ambulatory Visit: Payer: PPO | Admitting: Occupational Therapy

## 2017-11-03 ENCOUNTER — Encounter: Payer: Self-pay | Admitting: Speech Pathology

## 2017-11-03 ENCOUNTER — Ambulatory Visit: Payer: PPO | Admitting: Speech Pathology

## 2017-11-03 DIAGNOSIS — R2681 Unsteadiness on feet: Secondary | ICD-10-CM

## 2017-11-03 DIAGNOSIS — R278 Other lack of coordination: Secondary | ICD-10-CM

## 2017-11-03 DIAGNOSIS — R262 Difficulty in walking, not elsewhere classified: Secondary | ICD-10-CM | POA: Diagnosis not present

## 2017-11-03 DIAGNOSIS — R490 Dysphonia: Secondary | ICD-10-CM

## 2017-11-03 DIAGNOSIS — M6281 Muscle weakness (generalized): Secondary | ICD-10-CM

## 2017-11-03 NOTE — Therapy (Signed)
[place] MAIN [facility] SERVICES [address] [place], [place], [address] Phone: [phone]   Fax:  [phone]  Speech Language Pathology Treatment  Patient Details  Name: Randall [name] MRN: [id number] Date of Birth: 11-02-1950 Referring Provider: Dr. [name]   Encounter Date: 11/03/2017  End of Session - 11/03/17 1456    Visit Number  9    Number of Visits  17    Date for SLP Re-Evaluation  11/14/17    SLP Start Time  1400    SLP Stop Time   1451    SLP Time Calculation (min)  51 min    Activity Tolerance  Patient tolerated treatment well       Past Medical History:  Diagnosis Date  . Allergic   . Colon adenoma   . Depression   . Diabetes mellitus, type II ([place])   . Diverticulosis   . Hypertension   . Seizures ([place])   . Sleep apnea   . Urinary hesitancy     Past Surgical History:  Procedure Laterality Date  . COLON SURGERY    . HERNIA REPAIR    . UVULOPALATOPHARYNGOPLASTY, TONSILLECTOMY AND SEPTOPLASTY      There were no vitals filed for this visit.  Subjective Assessment - 11/03/17 1455    Subjective  Patient reports that his choir leader notes improved loudness            ADULT SLP TREATMENT - 11/03/17 0001      General Information   Behavior/Cognition  Alert;Cooperative;Pleasant mood    HPI   67 year old man diagnosed with Parkinson's disease in 2016.  Patient states that he went through the LSVT LOUD and BIG in 2016.       Treatment Provided   Treatment provided  Cognitive-Linquistic      Pain Assessment   Pain Assessment  No/denies pain      Cognitive-Linquistic Treatment   Treatment focused on  Voice    Skilled Treatment  Daily Task #1 (Maximum sustained "ah"): Average 15 seconds, 85 dB. Daily Task 2 (Maximum fundamental frequency range): Highs: 15 high pitched "ah" given min cues. Lows: 15 low pitched "ah" given min cues. Daily task #3 (Maximum speech loudness drill of functional phrases): Average 75 dB.   Hierarchal speech loudness drill: Read paragraphs, varies between 72 dB and 77 dB.  Generate short responses, 72 dB.  Homework: assignments completed.  Off the cuff remarks: average 70 dB, improves to 75 dB with SLP cues.      Assessment / Recommendations / Plan   Plan  Continue with current plan of care      Progression Toward Goals   Progression toward goals  Progressing toward goals       SLP Education - 11/03/17 1455    Education provided  Yes    Education Details  LSVT-LOUD, singing loudly and tunefully    Person(s) Educated  Patient    Methods  Explanation    Comprehension  Verbalized understanding         SLP Long Term Goals - 10/13/17 1539      SLP LONG TERM GOAL #1   Title  The patient will complete Daily Tasks (Maximum duration "ah", High/Lows, and Functional Phrases) at average loudness of 80 dB and with loud, good quality voice.     Time  4    Period  Weeks    Status  New    Target Date  11/14/17  SLP LONG TERM GOAL #2   Title  The patient will complete Hierarchal Speech Loudness reading drills (words/phrases, sentences, and paragraph) at average 75 dB and with loud, good quality voice.      Time  4    Period  Weeks    Status  New    Target Date  11/14/17      SLP LONG TERM GOAL #3   Title  The patient will participate in conversation, maintaining average loudness of 75 dB and loud, good quality voice.    Time  4    Period  Weeks    Status  New    Target Date  11/14/17      SLP LONG TERM GOAL #4   Title  The patient will complete homework daily.    Time  4    Period  Weeks    Status  New    Target Date  11/14/17       Plan - 11/03/17 1456    Clinical Impression Statement  The patient is completing daily tasks and hierarchal speech drill tasks with loud, good quality voice given fewer SLP cues.      Speech Therapy Frequency  4x / week    Duration  4 weeks    Treatment/Interventions  Patient/family education;Other (comment) LSVT-LOUD     Potential to Achieve Goals  Good    Potential Considerations  Ability to learn/carryover information;Severity of impairments;Co-morbidities;Cooperation/participation level;Family/community support;Medical prognosis;Previous level of function    SLP Home Exercise Plan  LSVT-LOUD daily homework    Consulted and Agree with Plan of Care  Patient       Patient will benefit from skilled therapeutic intervention in order to improve the following deficits and impairments:   Dysphonia    Problem List Patient Active Problem List   Diagnosis Date Noted  . Adenoma of large intestine 02/01/2016  . Clinical depression 02/01/2016  . 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 Benign essential HTN 02/01/2016  . 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/2016  . Pure hypercholesterolemia 02/01/2016  . Type 2 diabetes mellitus ([place]) 01/10/2016  . Microalbuminuria 01/10/2016  . Degeneration of intervertebral disc of lumbar region 07/13/2015  . Parkinson's disease ([place]) 11/16/2012   [name], MS/CCC- SLP  [name] 11/03/2017, 2:57 PM  [place] MAIN [facility] SERVICES [address] [place], [place], [address] Phone: [phone]   Fax:  [phone]   Name: Randall [name] MRN: [id number] Date of Birth: 01/19/1951

## 2017-11-04 ENCOUNTER — Ambulatory Visit: Payer: PPO | Admitting: Occupational Therapy

## 2017-11-04 ENCOUNTER — Encounter: Payer: Self-pay | Admitting: Speech Pathology

## 2017-11-04 ENCOUNTER — Ambulatory Visit: Payer: PPO | Admitting: Speech Pathology

## 2017-11-04 DIAGNOSIS — R278 Other lack of coordination: Secondary | ICD-10-CM

## 2017-11-04 DIAGNOSIS — R262 Difficulty in walking, not elsewhere classified: Secondary | ICD-10-CM

## 2017-11-04 DIAGNOSIS — R2681 Unsteadiness on feet: Secondary | ICD-10-CM

## 2017-11-04 DIAGNOSIS — R490 Dysphonia: Secondary | ICD-10-CM

## 2017-11-04 DIAGNOSIS — M6281 Muscle weakness (generalized): Secondary | ICD-10-CM

## 2017-11-04 NOTE — Therapy (Signed)
[place] MAIN [facility] SERVICES [address] [place], [place], [address] Phone: [phone]   Fax:  [phone]  Speech Language Pathology Treatment  Patient Details  Name: Randall [name] MRN: [id number] Date of Birth: [birth year] Referring Provider: Dr. [name]   Encounter Date: 11/04/2017  End of Session - 11/04/17 1702    Visit Number  10    Number of Visits  17    Date for SLP Re-Evaluation  11/14/17    SLP Start Time  1400    SLP Stop Time   1452    SLP Time Calculation (min)  52 min    Activity Tolerance  Patient tolerated treatment well       Past Medical History:  Diagnosis Date  . Allergic   . Colon adenoma   . Depression   . Diabetes mellitus, type II ([place])   . Diverticulosis   . Hypertension   . Seizures ([place])   . Sleep apnea   . Urinary hesitancy     Past Surgical History:  Procedure Laterality Date  . COLON SURGERY    . HERNIA REPAIR    . UVULOPALATOPHARYNGOPLASTY, TONSILLECTOMY AND SEPTOPLASTY      There were no vitals filed for this visit.  Subjective Assessment - 11/04/17 1700    Subjective  Patient reports that he sang well at [facility] last night            ADULT SLP TREATMENT - 11/04/17 0001      General Information   Behavior/Cognition  Alert;Cooperative;Pleasant mood    HPI   67 year old man diagnosed with Parkinson's disease in 2016.  Patient states that he went through the LSVT LOUD and BIG in 2016.       Treatment Provided   Treatment provided  Cognitive-Linquistic      Pain Assessment   Pain Assessment  No/denies pain      Cognitive-Linquistic Treatment   Treatment focused on  Voice    Skilled Treatment  Daily Task #1 (Maximum sustained "ah"): Average 15 seconds, 85 dB. Daily Task 2 (Maximum fundamental frequency range): Highs: 15 high pitched "ah" given min cues. Lows: 15 low pitched "ah" given min cues. Daily task #3 (Maximum speech loudness drill of functional phrases): Average 75 dB.   Hierarchal speech loudness drill: Read paragraphs, varies between 72 dB and 77 dB.  Generate short responses, 72 dB.  Homework: assignments completed.  Off the cuff remarks: average 70 dB, improves to 75 dB with SLP cues.      Assessment / Recommendations / Plan   Plan  Continue with current plan of care      Progression Toward Goals   Progression toward goals  Progressing toward goals       SLP Education - 11/04/17 1702    Education provided  Yes    Education Details  LSVT-LOUD, resonsnt voice    Person(s) Educated  Patient    Methods  Explanation    Comprehension  Verbalized understanding         SLP Long Term Goals - 10/13/17 1539      SLP LONG TERM GOAL #1   Title  The patient will complete Daily Tasks (Maximum duration "ah", High/Lows, and Functional Phrases) at average loudness of 80 dB and with loud, good quality voice.     Time  4    Period  Weeks    Status  New    Target Date  11/14/17  SLP LONG TERM GOAL #2   Title  The patient will complete Hierarchal Speech Loudness reading drills (words/phrases, sentences, and paragraph) at average 75 dB and with loud, good quality voice.      Time  4    Period  Weeks    Status  New    Target Date  11/14/17      SLP LONG TERM GOAL #3   Title  The patient will participate in conversation, maintaining average loudness of 75 dB and loud, good quality voice.    Time  4    Period  Weeks    Status  New    Target Date  11/14/17      SLP LONG TERM GOAL #4   Title  The patient will complete homework daily.    Time  4    Period  Weeks    Status  New    Target Date  11/14/17       Plan - 11/04/17 1702    Clinical Impression Statement  The patient is completing daily tasks and hierarchal speech drill tasks with loud, good quality voice given fewer SLP cues.      Speech Therapy Frequency  4x / week    Duration  4 weeks    Treatment/Interventions  Patient/family education;Other (comment) LSVT-LOUD    Potential to Achieve  Goals  Good    Potential Considerations  Ability to learn/carryover information;Severity of impairments;Co-morbidities;Cooperation/participation level;Family/community support;Medical prognosis;Previous level of function    SLP Home Exercise Plan  LSVT-LOUD daily homework    Consulted and Agree with Plan of Care  Patient       Patient will benefit from skilled therapeutic intervention in order to improve the following deficits and impairments:   Dysphonia    Problem List Patient Active Problem List   Diagnosis Date Noted  . Adenoma of large intestine 02/01/2016  . Clinical depression 02/01/2016  . 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 Benign essential HTN 02/01/2016  . 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/2016  . Pure hypercholesterolemia 02/01/2016  . Type 2 diabetes mellitus ([place]) 01/10/2016  . Microalbuminuria 01/10/2016  . Degeneration of intervertebral disc of lumbar region 07/13/2015  . Parkinson's disease ([place]) 11/16/2012   [name], MS/CCC- SLP  [name] 11/04/2017, 5:03 PM  [place] MAIN [facility] SERVICES [address] [place], [place], [address] Phone: [phone]   Fax:  [phone]   Name: Randall [name] MRN: [id number] Date of Birth: [birth year]

## 2017-11-05 ENCOUNTER — Encounter: Payer: Self-pay | Admitting: Speech Pathology

## 2017-11-05 ENCOUNTER — Encounter: Payer: Self-pay | Admitting: Occupational Therapy

## 2017-11-05 ENCOUNTER — Ambulatory Visit: Payer: PPO | Admitting: Occupational Therapy

## 2017-11-05 ENCOUNTER — Ambulatory Visit: Payer: PPO | Admitting: Speech Pathology

## 2017-11-05 DIAGNOSIS — M6281 Muscle weakness (generalized): Secondary | ICD-10-CM

## 2017-11-05 DIAGNOSIS — R2681 Unsteadiness on feet: Secondary | ICD-10-CM

## 2017-11-05 DIAGNOSIS — R278 Other lack of coordination: Secondary | ICD-10-CM

## 2017-11-05 DIAGNOSIS — R262 Difficulty in walking, not elsewhere classified: Secondary | ICD-10-CM | POA: Diagnosis not present

## 2017-11-05 DIAGNOSIS — R490 Dysphonia: Secondary | ICD-10-CM

## 2017-11-05 NOTE — Therapy (Signed)
[place] MAIN [facility] SERVICES [address] [place], [place], [address] Phone: [phone]   Fax:  [phone]  Speech Language Pathology Treatment  Patient Details  Name: Randall [name] MRN: [id number] Date of Birth: 05-03-1951 Referring Provider: Dr. [name]   Encounter Date: 11/05/2017  End of Session - 11/05/17 1534    Visit Number  11    Number of Visits  17    Date for SLP Re-Evaluation  11/14/17    SLP Start Time  39    SLP Stop Time   1454    SLP Time Calculation (min)  54 min    Activity Tolerance  Patient tolerated treatment well       Past Medical History:  Diagnosis Date  . Allergic   . Colon adenoma   . Depression   . Diabetes mellitus, type II ([place])   . Diverticulosis   . Hypertension   . Seizures ([place])   . Sleep apnea   . Urinary hesitancy     Past Surgical History:  Procedure Laterality Date  . COLON SURGERY    . HERNIA REPAIR    . UVULOPALATOPHARYNGOPLASTY, TONSILLECTOMY AND SEPTOPLASTY      There were no vitals filed for this visit.  Subjective Assessment - 11/05/17 1534    Subjective  "I'm trying"    Currently in Pain?  No/denies            ADULT SLP TREATMENT - 11/05/17 0001      General Information   Behavior/Cognition  Alert;Cooperative;Pleasant mood    HPI   67 year old man diagnosed with Parkinson's disease in 2016.  Patient states that he went through the LSVT LOUD and BIG in 2016.       Treatment Provided   Treatment provided  Cognitive-Linquistic      Pain Assessment   Pain Assessment  No/denies pain      Cognitive-Linquistic Treatment   Treatment focused on  Voice    Skilled Treatment  Daily Task #1 (Maximum sustained "ah"): Average 15 seconds, 85 dB. Daily Task 2 (Maximum fundamental frequency range): Highs: 15 high pitched "ah" given min cues. Lows: 15 low pitched "ah" given min cues. Daily task #3 (Maximum speech loudness drill of functional phrases): Average 75 dB.  Hierarchal  speech loudness drill: Read paragraphs, varies between 72 dB and 77 dB.  Generate short responses, 72 dB.  Conversational speech; 70 dB.  Homework: assignments completed.  Off the cuff remarks: average 70 dB, improves to 75 dB with SLP cues.      Assessment / Recommendations / Plan   Plan  Continue with current plan of care      Progression Toward Goals   Progression toward goals  Progressing toward goals       SLP Education - 11/05/17 1534    Education provided  Yes    Education Details  LSVT-LOUD, resonant voice    Person(s) Educated  Patient    Methods  Explanation    Comprehension  Verbalized understanding         SLP Long Term Goals - 10/13/17 1539      SLP LONG TERM GOAL #1   Title  The patient will complete Daily Tasks (Maximum duration "ah", High/Lows, and Functional Phrases) at average loudness of 80 dB and with loud, good quality voice.     Time  4    Period  Weeks    Status  New    Target Date  11/14/17      SLP LONG TERM GOAL #2   Title  The patient will complete Hierarchal Speech Loudness reading drills (words/phrases, sentences, and paragraph) at average 75 dB and with loud, good quality voice.      Time  4    Period  Weeks    Status  New    Target Date  11/14/17      SLP LONG TERM GOAL #3   Title  The patient will participate in conversation, maintaining average loudness of 75 dB and loud, good quality voice.    Time  4    Period  Weeks    Status  New    Target Date  11/14/17      SLP LONG TERM GOAL #4   Title  The patient will complete homework daily.    Time  4    Period  Weeks    Status  New    Target Date  11/14/17       Plan - 11/05/17 1535    Clinical Impression Statement  The patient is completing daily tasks and hierarchal speech drill tasks with loud, good quality voice given fewer SLP cues.      Speech Therapy Frequency  4x / week    Duration  4 weeks    Treatment/Interventions  Patient/family education;Other (comment) LSVT-LOUD     Potential to Achieve Goals  Good    Potential Considerations  Ability to learn/carryover information;Severity of impairments;Co-morbidities;Cooperation/participation level;Family/community support;Medical prognosis;Previous level of function    SLP Home Exercise Plan  LSVT-LOUD daily homework    Consulted and Agree with Plan of Care  Patient       Patient will benefit from skilled therapeutic intervention in order to improve the following deficits and impairments:   Dysphonia    Problem List Patient Active Problem List   Diagnosis Date Noted  . Adenoma of large intestine 02/01/2016  . Clinical depression 02/01/2016  . 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 Benign essential HTN 02/01/2016  . 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/2016  . Pure hypercholesterolemia 02/01/2016  . Type 2 diabetes mellitus ([place]) 01/10/2016  . Microalbuminuria 01/10/2016  . Degeneration of intervertebral disc of lumbar region 07/13/2015  . Parkinson's disease ([place]) 11/16/2012   [name], MS/CCC- SLP  [name] 11/05/2017, 3:35 PM  [place] MAIN [facility] SERVICES [address] [place], [place], [address] Phone: [phone]   Fax:  [phone]   Name: Randall [name] MRN: [id number] Date of Birth: 04/05/1951

## 2017-11-05 NOTE — Therapy (Signed)
[place] MAIN [facility] SERVICES [address] [place], [place], [address] Phone: [phone]   Fax:  [phone]  Occupational Therapy Treatment  Patient Details  Name: Randall [name] MRN: [id number] Date of Birth: Jan 20, 1951 Referring Provider: Joselyn Arrow   Encounter Date: 11/03/2017  OT End of Session - 11/05/17 1643    Visit Number  9    Number of Visits  17    Date for OT Re-Evaluation  11/21/17    OT Start Time  1300    OT Stop Time  1358    OT Time Calculation (min)  58 min    Activity Tolerance  Patient tolerated treatment well    Behavior During Therapy  [facility] for tasks assessed/performed       Past Medical History:  Diagnosis Date  . Allergic   . Colon adenoma   . Depression   . Diabetes mellitus, type II ([place])   . Diverticulosis   . Hypertension   . Seizures ([place])   . Sleep apnea   . Urinary hesitancy     Past Surgical History:  Procedure Laterality Date  . COLON SURGERY    . HERNIA REPAIR    . UVULOPALATOPHARYNGOPLASTY, TONSILLECTOMY AND SEPTOPLASTY      There were no vitals filed for this visit.  Subjective Assessment - 11/05/17 1642    Subjective   Patient reports he had a good weekend but didn't get out much because of the rain, he did perform his exercises at home.     Pertinent History  Patient was diagnosed with Parkinson's about 8 years ago, he reports a recent increase in tremors in right hand especially when he feels stressed or cold (which is often).  Patient has had increased difficulty with walking, increased foot drag on the right, no recent falls. Increased difficulty in recent months with self care tasks.     Patient Stated Goals  Patient reports he would like to be independent, move better, feed self and not have tremors.    Currently in Pain?  No/denies    Multiple Pain Sites  No                   OT Treatments/Exercises (OP) - 11/05/17 1645      ADLs   Grooming  Functional mobility for 1  set of 700 feet no rest breaks, cues for reciprocal arm swing, amplitude of gait and heel to toe strike patterns.     ADL Comments  Functional component tasks as follows:  1)  sit to stand from chair without the use of arms, 5 repetitions and cues for technique, 2)  crossing legs to reach to put on socks, 3) managing buttons with implementation of hand flicks prior to buttons, 4)  handwriting with larger lined paper, 5) reciprocal arm and stepping patterns.      Neurological Re-education Exercises   Other Exercises 1  Patient seen for instruction of LSVT BIG exercises: LSVT Daily Session Maximal Daily Exercises: Sustained movements are designed to rescale the amplitude of movement output for generalization to daily functional activities. Performed as follows for 1 set of 10 repetitions each: Multi directional sustained movements- 1) Floor to ceiling, 2) Side to side. Multi directional Repetitive movements performed in standing and are designed to provide retraining effort needed for sustained muscle activation in tasks Performed as follows: 3) Step and reach forward, 4) Step and Reach Backwards, 5) Step and reach sideways, 6) Rock and reach forward/backward, 7)  Rock and reach sideways. Requires CGA to minimal assist for all exercises in standing with Cues for arm positioning during exercises.Advanced in complexity this date with addition of alternating sides when performing exercises with increased cues, therapist demo and CGA to [place]             OT Education - 11/05/17 1643    Education provided  Yes    Education Details  increased complexity of maximal daily exercises with addition of alternating sides.     Person(s) Educated  Patient    Methods  Explanation;Demonstration;Verbal cues    Comprehension  Verbal cues required;Returned demonstration;Verbalized understanding          OT Long Term Goals - 11/02/17 2042      OT LONG TERM GOAL #1   Title  Patient will complete HEP for maximal  daily exercises with modified independence in 4 weeks      Baseline  no current home program    Time  4    Period  Weeks    Status  On-going      OT LONG TERM GOAL #2   Title  Patient will improve gait speed and endurance and be able to walk 1650 feet in 6 minutes with improved posture and increased amplitude of gait to negotiate around the home and community safely in 4 weeks.      Baseline  1550 feet at eval    Time  4    Period  Weeks    Status  On-going      OT LONG TERM GOAL #3   Title  Patient will perform sit to stand from lower and unstable surfaces with modified independence.       Baseline  difficulty with lower surfaces    Time  4    Period  Weeks    Status  On-going      OT LONG TERM GOAL #4   Title  Patient will complete handwriting with name and address with 75% legibility.     Baseline  50% legibility at eval    Time  4    Period  Weeks    Status  On-going      OT LONG TERM GOAL #5   Title  Patient will complete sock donning with modified independence consistently.     Baseline  difficulty with donning socks at times.     Time  4    Period  Weeks    Status  On-going            Plan - 11/05/17 1644    Clinical Impression Statement  Patient progressing with exercises and therefore added increased complexity of exercises this date with alternating sides, right and left during maximal daily exercises.  Patient requires cues, therapist demo and CGA to SBA for change in exercise and still requires occasional cues for arm positioning.  Patient has improved with managing buttons and use of hand flicks prior to buttons, handwriting and fine motor coordination tasks.  Patient continues to benefit from skilled OT to maximize safety and independence in daily tasks.     Occupational Profile and client history currently impacting functional performance  progression of Parkinson's disease, decreased balance, increased right hand tremors, increased difficulty with self care  tasks, difficulty walking    Occupational performance deficits (Please refer to evaluation for details):  ADL's;IADL's;Leisure;Social Participation;Rest and Sleep    Rehab Potential  Good    Current Impairments/barriers affecting progress:  progressive disease, balance, posture, anxiety,  memory    OT Frequency  4x / week    OT Duration  4 weeks    OT Treatment/Interventions  Self-care/ADL training;Moist Heat;DME and/or AE instruction;Balance training;Gait Training;Therapeutic activities;Therapeutic exercise;Stair Training;Cognitive remediation/compensation;Neuromuscular education;Functional Mobility Training;Patient/family education    Consulted and Agree with Plan of Care  Patient       Patient will benefit from skilled therapeutic intervention in order to improve the following deficits and impairments:     Visit Diagnosis: Muscle weakness (generalized)  Difficulty in walking, not elsewhere classified  Other lack of coordination  Unsteadiness on feet    Problem List Patient Active Problem List   Diagnosis Date Noted  . Adenoma of large intestine 02/01/2016  . Clinical depression 02/01/2016  . 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 Benign essential HTN 02/01/2016  . 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/2016  . Pure hypercholesterolemia 02/01/2016  . Type 2 diabetes mellitus ([place]) 01/10/2016  . Microalbuminuria 01/10/2016  . Degeneration of intervertebral disc of lumbar region 07/13/2015  . Parkinson's disease ([place]) 11/16/2012   Omaree Fuqua T Tomasita Morrow, OTR/L, CLT  Zakiya Sporrer 11/05/2017, 4:49 PM  [place] MAIN [facility] SERVICES [address] [place], [place], [address] Phone: [phone]   Fax:  [phone]  Name: Randall [name] MRN: [id number] Date of Birth: October 31, 1950

## 2017-11-06 ENCOUNTER — Encounter: Payer: Self-pay | Admitting: Speech Pathology

## 2017-11-06 ENCOUNTER — Ambulatory Visit: Payer: PPO | Admitting: Occupational Therapy

## 2017-11-06 ENCOUNTER — Ambulatory Visit: Payer: PPO | Admitting: Speech Pathology

## 2017-11-06 ENCOUNTER — Encounter: Payer: Self-pay | Admitting: Occupational Therapy

## 2017-11-06 DIAGNOSIS — R262 Difficulty in walking, not elsewhere classified: Secondary | ICD-10-CM | POA: Diagnosis not present

## 2017-11-06 DIAGNOSIS — M6281 Muscle weakness (generalized): Secondary | ICD-10-CM

## 2017-11-06 DIAGNOSIS — R2681 Unsteadiness on feet: Secondary | ICD-10-CM

## 2017-11-06 DIAGNOSIS — R490 Dysphonia: Secondary | ICD-10-CM

## 2017-11-06 DIAGNOSIS — R278 Other lack of coordination: Secondary | ICD-10-CM

## 2017-11-06 NOTE — Therapy (Signed)
[place] MAIN [facility] SERVICES [address] [place], [place], [address] Phone: [phone]   Fax:  [phone]  Speech Language Pathology Treatment  Patient Details  Name: Randall [name] MRN: [id number] Date of Birth: 30-Jun-1951 Referring Provider: Dr. [name]   Encounter Date: 11/06/2017  End of Session - 11/06/17 1456    Visit Number  12    Number of Visits  17    Date for SLP Re-Evaluation  11/14/17    SLP Start Time  1400    SLP Stop Time   1456    SLP Time Calculation (min)  56 min    Activity Tolerance  Patient tolerated treatment well       Past Medical History:  Diagnosis Date  . Allergic   . Colon adenoma   . Depression   . Diabetes mellitus, type II ([place])   . Diverticulosis   . Hypertension   . Seizures ([place])   . Sleep apnea   . Urinary hesitancy     Past Surgical History:  Procedure Laterality Date  . COLON SURGERY    . HERNIA REPAIR    . UVULOPALATOPHARYNGOPLASTY, TONSILLECTOMY AND SEPTOPLASTY      There were no vitals filed for this visit.  Subjective Assessment - 11/06/17 1455    Subjective  "I'm trying"            ADULT SLP TREATMENT - 11/06/17 0001      General Information   Behavior/Cognition  Alert;Cooperative;Pleasant mood    HPI   67 year old man diagnosed with Parkinson's disease in 2016.  Patient states that he went through the LSVT LOUD and BIG in 2016.       Treatment Provided   Treatment provided  Cognitive-Linquistic      Pain Assessment   Pain Assessment  No/denies pain      Cognitive-Linquistic Treatment   Treatment focused on  Voice    Skilled Treatment  Daily Task #1 (Maximum sustained "ah"): Average 15 seconds, 85 dB. Daily Task 2 (Maximum fundamental frequency range): Highs: 15 high pitched "ah" given min cues. Lows: 15 low pitched "ah" given min cues. Daily task #3 (Maximum speech loudness drill of functional phrases): Average 75 dB.  Hierarchal speech loudness drill: Read  paragraphs, varies between 72 dB and 77 dB.  Generate short responses, 72 dB.  Conversational speech; 70 dB.  Homework: assignments completed.  Off the cuff remarks: average 70 dB, improves to 75 dB with SLP cues.      Assessment / Recommendations / Plan   Plan  Continue with current plan of care      Progression Toward Goals   Progression toward goals  Progressing toward goals       SLP Education - 11/06/17 1455    Education provided  Yes    Education Details  LSVT-LOUD, resonant voice, rate of speech    Person(s) Educated  Patient    Methods  Explanation    Comprehension  Verbalized understanding         SLP Long Term Goals - 10/13/17 1539      SLP LONG TERM GOAL #1   Title  The patient will complete Daily Tasks (Maximum duration "ah", High/Lows, and Functional Phrases) at average loudness of 80 dB and with loud, good quality voice.     Time  4    Period  Weeks    Status  New    Target Date  11/14/17  SLP LONG TERM GOAL #2   Title  The patient will complete Hierarchal Speech Loudness reading drills (words/phrases, sentences, and paragraph) at average 75 dB and with loud, good quality voice.      Time  4    Period  Weeks    Status  New    Target Date  11/14/17      SLP LONG TERM GOAL #3   Title  The patient will participate in conversation, maintaining average loudness of 75 dB and loud, good quality voice.    Time  4    Period  Weeks    Status  New    Target Date  11/14/17      SLP LONG TERM GOAL #4   Title  The patient will complete homework daily.    Time  4    Period  Weeks    Status  New    Target Date  11/14/17       Plan - 11/06/17 1456    Clinical Impression Statement  The patient is completing daily tasks and hierarchal speech drill tasks with loud, good quality voice given fewer SLP cues.      Speech Therapy Frequency  4x / week    Duration  4 weeks    Treatment/Interventions  Patient/family education;Other (comment) LSVT-LOUD    Potential to  Achieve Goals  Good    Potential Considerations  Ability to learn/carryover information;Severity of impairments;Co-morbidities;Cooperation/participation level;Family/community support;Medical prognosis;Previous level of function    SLP Home Exercise Plan  LSVT-LOUD daily homework    Consulted and Agree with Plan of Care  Patient       Patient will benefit from skilled therapeutic intervention in order to improve the following deficits and impairments:   Dysphonia    Problem List Patient Active Problem List   Diagnosis Date Noted  . Adenoma of large intestine 02/01/2016  . Clinical depression 02/01/2016  . 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 Benign essential HTN 02/01/2016  . 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/2016  . Pure hypercholesterolemia 02/01/2016  . Type 2 diabetes mellitus ([place]) 01/10/2016  . Microalbuminuria 01/10/2016  . Degeneration of intervertebral disc of lumbar region 07/13/2015  . Parkinson's disease ([place]) 11/16/2012   [name], MS/CCC- SLP  [name] 11/06/2017, 2:57 PM  [place] MAIN [facility] SERVICES [address] [place], [place], [address] Phone: [phone]   Fax:  [phone]   Name: Randall [name] MRN: [id number] Date of Birth: 1951/08/16

## 2017-11-06 NOTE — Therapy (Signed)
[place] MAIN [facility] SERVICES [address] [place], [place], [address] Phone: [phone]   Fax:  [phone]  Occupational Therapy Treatment  Patient Details  Name: LOKI WUTHRICH MRN: [id number] Date of Birth: September 08, 1951 Referring Provider: Joselyn Arrow   Encounter Date: 11/06/2017  OT End of Session - 11/06/17 1908    Visit Number  12    Number of Visits  17    Date for OT Re-Evaluation  11/21/17    OT Start Time  1300    OT Stop Time  1400    OT Time Calculation (min)  60 min    Activity Tolerance  Patient tolerated treatment well    Behavior During Therapy  [facility] for tasks assessed/performed       Past Medical History:  Diagnosis Date  . Allergic   . Colon adenoma   . Depression   . Diabetes mellitus, type II ([place])   . Diverticulosis   . Hypertension   . Seizures ([place])   . Sleep apnea   . Urinary hesitancy     Past Surgical History:  Procedure Laterality Date  . COLON SURGERY    . HERNIA REPAIR    . UVULOPALATOPHARYNGOPLASTY, TONSILLECTOMY AND SEPTOPLASTY      There were no vitals filed for this visit.  Subjective Assessment - 11/06/17 1906    Subjective   Patient reports he thinks he "overdid it" with the exercises last night.  Reports he did more sit to stand exercises greater than 10 repetitions. Is sore today but denies pain.    Pertinent History  Patient was diagnosed with Parkinson's about 8 years ago, he reports a recent increase in tremors in right hand especially when he feels stressed or cold (which is often).  Patient has had increased difficulty with walking, increased foot drag on the right, no recent falls. Increased difficulty in recent months with self care tasks.     Patient Stated Goals  Patient reports he would like to be independent, move better, feed self and not have tremors.    Currently in Pain?  No/denies    Multiple Pain Sites  No                   OT Treatments/Exercises (OP) - 11/06/17  1933      ADLs   ADL Comments  Functional component tasks as follows:  1)  sit to stand from chair without the use of arms, 5 repetitions and cues for technique, 2)  crossing legs to reach to put on socks, 3) managing buttons with implementation of hand flicks prior to buttons, 4)  handwriting with larger lined paper, 5) reciprocal arm and stepping patterns.      Neurological Re-education Exercises   Other Exercises 1  Patient seen for instruction of LSVT BIG exercises: LSVT Daily Session Maximal Daily Exercises: Sustained movements are designed to rescale the amplitude of movement output for generalization to daily functional activities. Performed as follows for 1 set of 10 repetitions each: Multi directional sustained movements- 1) Floor to ceiling, 2) Side to side. Multi directional Repetitive movements performed in standing and are designed to provide retraining effort needed for sustained muscle activation in tasks Performed as follows: 3) Step and reach forward, 4) Step and Reach Backwards, 5) Step and reach sideways, 6) Rock and reach forward/backward, 7) Rock and reach sideways. Requires CGA to minimal assist for all exercises in standing with Cues for arm positioning during exercises. Continued to focus  on increased complexity with  alternating sides when performing exercises with increased cues, therapist demo and CGA to SBA     Other Exercises 2  Patient seen for functional mobility for 1 trial of 600 feet, patient reports he feels fatigued this date and not sure why.             OT Education - 11/06/17 1907    Education provided  Yes    Education Details  alternating sides with exercises    Person(s) Educated  Patient    Methods  Explanation;Demonstration;Verbal cues    Comprehension  Verbal cues required;Returned demonstration;Verbalized understanding          OT Long Term Goals - 11/05/17 1659      OT LONG TERM GOAL #1   Title  Patient will complete HEP for maximal daily  exercises with modified independence in 4 weeks      Baseline  no current home program    Time  4    Period  Weeks    Status  On-going      OT LONG TERM GOAL #2   Title  Patient will improve gait speed and endurance and be able to walk 1650 feet in 6 minutes with improved posture and increased amplitude of gait to negotiate around the home and community safely in 4 weeks.      Baseline  1550 feet at eval    Time  4    Period  Weeks    Status  On-going      OT LONG TERM GOAL #3   Title  Patient will perform sit to stand from lower and unstable surfaces with modified independence.       Baseline  difficulty with lower surfaces    Time  4    Period  Weeks    Status  On-going      OT LONG TERM GOAL #4   Title  Patient will complete handwriting with name and address with 75% legibility.     Baseline  50% legibility at eval    Time  4    Period  Weeks    Status  On-going      OT LONG TERM GOAL #5   Title  Patient will complete sock donning with modified independence consistently.     Baseline  difficulty with donning socks at times.     Time  4    Period  Weeks    Status  On-going            Plan - 11/06/17 1908    Clinical Impression Statement  Patient fatigued this date and demonstrated decreased balance when performing maximal daily exercises today.  Therapist provided increased assist for balance recovery.  Patient continues to work towards advancing with exercises with increased complexity.  Has some trouble with adding complexity to exercises at home, will monitor and see how things go over the weekend and make adjustments as needed.     Occupational Profile and client history currently impacting functional performance  progression of Parkinson's disease, decreased balance, increased right hand tremors, increased difficulty with self care tasks, difficulty walking    Occupational performance deficits (Please refer to evaluation for details):  ADL's;IADL's;Leisure;Social  Participation;Rest and Sleep    Rehab Potential  Good    Current Impairments/barriers affecting progress:  progressive disease, balance, posture, anxiety, memory    OT Frequency  4x / week    OT Duration  4 weeks    OT Treatment/Interventions  Self-care/ADL training;Moist Heat;DME and/or AE instruction;Balance training;Gait Training;Therapeutic activities;Therapeutic exercise;Stair Training;Cognitive remediation/compensation;Neuromuscular education;Functional Mobility Training;Patient/family education    Consulted and Agree with Plan of Care  Patient       Patient will benefit from skilled therapeutic intervention in order to improve the following deficits and impairments:  Abnormal gait, Decreased cognition, Decreased knowledge of use of DME, Decreased coordination, Decreased mobility, Improper body mechanics, Decreased activity tolerance, Decreased endurance, Decreased strength, Decreased balance, Difficulty walking, Impaired UE functional use  Visit Diagnosis: Muscle weakness (generalized)  Difficulty in walking, not elsewhere classified  Other lack of coordination  Unsteadiness on feet    Problem List Patient Active Problem List   Diagnosis Date Noted  . Adenoma of large intestine 02/01/2016  . Clinical depression 02/01/2016  . 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 Benign essential HTN 02/01/2016  . 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/2016  . Pure hypercholesterolemia 02/01/2016  . Type 2 diabetes mellitus ([place]) 01/10/2016  . Microalbuminuria 01/10/2016  . Degeneration of intervertebral disc of lumbar region 07/13/2015  . Parkinson's disease ([place]) 11/16/2012   Jeriann Sayres T Tomasita Morrow, OTR/L, CLT  Trevell Pariseau 11/06/2017, 7:36 PM  [place] MAIN [facility] SERVICES [address] [place], [place], [address] Phone: [phone]   Fax:  [phone]  Name: JAXTIN RAIMONDO MRN: [id number] Date of Birth: 1951-06-18

## 2017-11-06 NOTE — Therapy (Signed)
[place] MAIN [facility] SERVICES [address] [place], [place], [address] Phone: [phone]   Fax:  [phone]  Occupational Therapy Treatment  Patient Details  Name: Randall [name] MRN: [id number] Date of Birth: 1951-04-29 Referring Provider: Joselyn Arrow   Encounter Date: 11/05/2017  OT End of Session - 11/06/17 1841    Visit Number  11    Number of Visits  17    Date for OT Re-Evaluation  11/21/17    OT Start Time  1255    OT Stop Time  1356    OT Time Calculation (min)  61 min    Activity Tolerance  Patient tolerated treatment well    Behavior During Therapy  [facility] for tasks assessed/performed       Past Medical History:  Diagnosis Date  . Allergic   . Colon adenoma   . Depression   . Diabetes mellitus, type II ([place])   . Diverticulosis   . Hypertension   . Seizures ([place])   . Sleep apnea   . Urinary hesitancy     Past Surgical History:  Procedure Laterality Date  . COLON SURGERY    . HERNIA REPAIR    . UVULOPALATOPHARYNGOPLASTY, TONSILLECTOMY AND SEPTOPLASTY      There were no vitals filed for this visit.  Subjective Assessment - 11/06/17 1840    Subjective   Patient reports shopping was fine and he and his wife went to dinner afterwards.     Pertinent History  Patient was diagnosed with Parkinson's about 8 years ago, he reports a recent increase in tremors in right hand especially when he feels stressed or cold (which is often).  Patient has had increased difficulty with walking, increased foot drag on the right, no recent falls. Increased difficulty in recent months with self care tasks.     Patient Stated Goals  Patient reports he would like to be independent, move better, feed self and not have tremors.    Currently in Pain?  No/denies    Multiple Pain Sites  No                   OT Treatments/Exercises (OP) - 11/06/17 1901      ADLs   ADL Comments  Functional component tasks as follows:  1)  sit to stand from  chair without the use of arms, 5 repetitions and cues for technique, 2)  crossing legs to reach to put on socks, 3) managing buttons with implementation of hand flicks prior to buttons, 4)  handwriting with larger lined paper, 5) reciprocal arm and stepping patterns.      Neurological Re-education Exercises   Other Exercises 1  Patient seen for instruction of LSVT BIG exercises: LSVT Daily Session Maximal Daily Exercises: Sustained movements are designed to rescale the amplitude of movement output for generalization to daily functional activities. Performed as follows for 1 set of 10 repetitions each: Multi directional sustained movements- 1) Floor to ceiling, 2) Side to side. Multi directional Repetitive movements performed in standing and are designed to provide retraining effort needed for sustained muscle activation in tasks Performed as follows: 3) Step and reach forward, 4) Step and Reach Backwards, 5) Step and reach sideways, 6) Rock and reach forward/backward, 7) Rock and reach sideways. Requires CGA to minimal assist for all exercises in standing with Cues for arm positioning during exercises. Continued to focus on increased complexity with  alternating sides when performing exercises with increased cues, therapist demo and  CGA to SBA     Other Exercises 2  Patient seen for functional mobility skills with emphasis on amplitude of steps, height and length.  Ambulating 500 feet with cues, no rest breaks this date.              OT Education - 11/06/17 1841    Education provided  Yes    Education Details  LSVT BIG, HEP    Person(s) Educated  Patient    Methods  Explanation;Demonstration;Verbal cues    Comprehension  Verbal cues required;Returned demonstration;Verbalized understanding          OT Long Term Goals - 11/05/17 1659      OT LONG TERM GOAL #1   Title  Patient will complete HEP for maximal daily exercises with modified independence in 4 weeks      Baseline  no current home  program    Time  4    Period  Weeks    Status  On-going      OT LONG TERM GOAL #2   Title  Patient will improve gait speed and endurance and be able to walk 1650 feet in 6 minutes with improved posture and increased amplitude of gait to negotiate around the home and community safely in 4 weeks.      Baseline  1550 feet at eval    Time  4    Period  Weeks    Status  On-going      OT LONG TERM GOAL #3   Title  Patient will perform sit to stand from lower and unstable surfaces with modified independence.       Baseline  difficulty with lower surfaces    Time  4    Period  Weeks    Status  On-going      OT LONG TERM GOAL #4   Title  Patient will complete handwriting with name and address with 75% legibility.     Baseline  50% legibility at eval    Time  4    Period  Weeks    Status  On-going      OT LONG TERM GOAL #5   Title  Patient will complete sock donning with modified independence consistently.     Baseline  difficulty with donning socks at times.     Time  4    Period  Weeks    Status  On-going            Plan - 11/06/17 1841    Clinical Impression Statement  Patient able to demonstrate alternating sides with exercises in the clinic but has difficulty with performing at home, reports he gets confused about the exercises at times, recommended he perform in the standard version at home if he is not able to alternate sides. Continue to work on calibration of movement and working on increasing amplitude.     Occupational Profile and client history currently impacting functional performance  progression of Parkinson's disease, decreased balance, increased right hand tremors, increased difficulty with self care tasks, difficulty walking    Occupational performance deficits (Please refer to evaluation for details):  ADL's;IADL's;Leisure;Social Participation;Rest and Sleep    Rehab Potential  Good    Current Impairments/barriers affecting progress:  progressive disease,  balance, posture, anxiety, memory    OT Frequency  4x / week    OT Duration  4 weeks    OT Treatment/Interventions  Self-care/ADL training;Moist Heat;DME and/or AE instruction;Balance training;Gait Training;Therapeutic activities;Therapeutic exercise;Stair Training;Cognitive remediation/compensation;Neuromuscular education;Functional Mobility Training;Patient/family  education    Consulted and Agree with Plan of Care  Patient       Patient will benefit from skilled therapeutic intervention in order to improve the following deficits and impairments:  Abnormal gait, Decreased cognition, Decreased knowledge of use of DME, Decreased coordination, Decreased mobility, Improper body mechanics, Decreased activity tolerance, Decreased endurance, Decreased strength, Decreased balance, Difficulty walking, Impaired UE functional use  Visit Diagnosis: Muscle weakness (generalized)  Difficulty in walking, not elsewhere classified  Other lack of coordination  Unsteadiness on feet    Problem List Patient Active Problem List   Diagnosis Date Noted  . Adenoma of large intestine 02/01/2016  . Clinical depression 02/01/2016  . 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 Benign essential HTN 02/01/2016  . 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/2016  . Pure hypercholesterolemia 02/01/2016  . Type 2 diabetes mellitus ([place]) 01/10/2016  . Microalbuminuria 01/10/2016  . Degeneration of intervertebral disc of lumbar region 07/13/2015  . Parkinson's disease ([place]) 11/16/2012   Aby Gessel T Tomasita Morrow, OTR/L, CLT  Shaquille Janes 11/06/2017, 7:05 PM  [place] MAIN [facility] SERVICES [address] [place], [place], [address] Phone: [phone]   Fax:  [phone]  Name: Randall [name] MRN: [id number] Date of Birth: Jan 26, 1951

## 2017-11-06 NOTE — Therapy (Signed)
[place] MAIN [facility] SERVICES [address] [place], [place], [address] Phone: [phone]   Fax:  [phone]  Occupational Therapy Treatment  Patient Details  Name: Randall [name] MRN: [id number] Date of Birth: [birth year] Referring Provider: Joselyn Arrow   Encounter Date: 11/04/2017  OT End of Session - 11/05/17 1659    Visit Number  10    Number of Visits  17    Date for OT Re-Evaluation  11/21/17    OT Start Time  6962    OT Stop Time  1400    OT Time Calculation (min)  62 min    Activity Tolerance  Patient tolerated treatment well    Behavior During Therapy  [facility] for tasks assessed/performed       Past Medical History:  Diagnosis Date  . Allergic   . Colon adenoma   . Depression   . Diabetes mellitus, type II ([place])   . Diverticulosis   . Hypertension   . Seizures ([place])   . Sleep apnea   . Urinary hesitancy     Past Surgical History:  Procedure Laterality Date  . COLON SURGERY    . HERNIA REPAIR    . UVULOPALATOPHARYNGOPLASTY, TONSILLECTOMY AND SEPTOPLASTY      There were no vitals filed for this visit.  Subjective Assessment - 11/05/17 1657    Subjective   Patient reports he is doing well, is planning to go shopping some with his wife today after therapy, he states, he usually walks around waiting for her to finish looking.     Pertinent History  Patient was diagnosed with Parkinson's about 8 years ago, he reports a recent increase in tremors in right hand especially when he feels stressed or cold (which is often).  Patient has had increased difficulty with walking, increased foot drag on the right, no recent falls. Increased difficulty in recent months with self care tasks.     Patient Stated Goals  Patient reports he would like to be independent, move better, feed self and not have tremors.    Currently in Pain?  No/denies    Multiple Pain Sites  No                   OT Treatments/Exercises (OP) - 11/06/17 1434       ADLs   ADL Comments  Functional component tasks as follows:  1)  sit to stand from chair without the use of arms, 5 repetitions and cues for technique, 2)  crossing legs to reach to put on socks, 3) managing buttons with implementation of hand flicks prior to buttons, 4)  handwriting with larger lined paper, 5) reciprocal arm and stepping patterns.      Neurological Re-education Exercises   Other Exercises 1  Patient seen for instruction of LSVT BIG exercises: LSVT Daily Session Maximal Daily Exercises: Sustained movements are designed to rescale the amplitude of movement output for generalization to daily functional activities. Performed as follows for 1 set of 10 repetitions each: Multi directional sustained movements- 1) Floor to ceiling, 2) Side to side. Multi directional Repetitive movements performed in standing and are designed to provide retraining effort needed for sustained muscle activation in tasks Performed as follows: 3) Step and reach forward, 4) Step and Reach Backwards, 5) Step and reach sideways, 6) Rock and reach forward/backward, 7) Rock and reach sideways. Requires CGA to minimal assist for all exercises in standing with Cues for arm positioning during exercises. Continued to focus  on increased complexity with  alternating sides when performing exercises with increased cues, therapist demo and CGA to SBA     Other Exercises 2  Patient seen with focus on heel to toe strike patterns on right during functional mobility skills, cues from therapist especially during increased environmental distractions and distractions in conversation.             OT Education - 11/05/17 1658    Education provided  Yes    Education Details  continued focus on increased complexity of exercises with alternating sides    Person(s) Educated  Patient    Methods  Explanation;Demonstration;Verbal cues    Comprehension  Verbal cues required;Returned demonstration;Verbalized understanding           OT Long Term Goals - 11/05/17 1659      OT LONG TERM GOAL #1   Title  Patient will complete HEP for maximal daily exercises with modified independence in 4 weeks      Baseline  no current home program    Time  4    Period  Weeks    Status  On-going      OT LONG TERM GOAL #2   Title  Patient will improve gait speed and endurance and be able to walk 1650 feet in 6 minutes with improved posture and increased amplitude of gait to negotiate around the home and community safely in 4 weeks.      Baseline  1550 feet at eval    Time  4    Period  Weeks    Status  On-going      OT LONG TERM GOAL #3   Title  Patient will perform sit to stand from lower and unstable surfaces with modified independence.       Baseline  difficulty with lower surfaces    Time  4    Period  Weeks    Status  On-going      OT LONG TERM GOAL #4   Title  Patient will complete handwriting with name and address with 75% legibility.     Baseline  50% legibility at eval    Time  4    Period  Weeks    Status  On-going      OT LONG TERM GOAL #5   Title  Patient will complete sock donning with modified independence consistently.     Baseline  difficulty with donning socks at times.     Time  4    Period  Weeks    Status  On-going            Plan - 11/05/17 1659    Clinical Impression Statement  Patient continues to work towards progressing with balance and exercises.  Difficulty recalling arm movements with exercises and often requires cues and/or therapist demo.  Patient able to demonstrate right/left alternating patterns in the session with assist from therapist but has more difficulty incorporating into home exercises.  Will continue to focus on advancing this area to reduce the risk for falls and improve balance. He has continued to progress in all areas and continues to benefit from skilled OT to maximize safety and independence in daily tasks.    Occupational Profile and client history currently  impacting functional performance  progression of Parkinson's disease, decreased balance, increased right hand tremors, increased difficulty with self care tasks, difficulty walking    Occupational performance deficits (Please refer to evaluation for details):  ADL's;IADL's;Leisure;Social Participation;Rest and Sleep    Rehab  Potential  Good    Current Impairments/barriers affecting progress:  progressive disease, balance, posture, anxiety, memory    OT Frequency  4x / week    OT Duration  4 weeks    OT Treatment/Interventions  Self-care/ADL training;Moist Heat;DME and/or AE instruction;Balance training;Gait Training;Therapeutic activities;Therapeutic exercise;Stair Training;Cognitive remediation/compensation;Neuromuscular education;Functional Mobility Training;Patient/family education    Consulted and Agree with Plan of Care  Patient       Patient will benefit from skilled therapeutic intervention in order to improve the following deficits and impairments:  Abnormal gait, Decreased cognition, Decreased knowledge of use of DME, Decreased coordination, Decreased mobility, Improper body mechanics, Decreased activity tolerance, Decreased endurance, Decreased strength, Decreased balance, Difficulty walking, Impaired UE functional use  Visit Diagnosis: Muscle weakness (generalized)  Difficulty in walking, not elsewhere classified  Other lack of coordination  Unsteadiness on feet    Problem List Patient Active Problem List   Diagnosis Date Noted  . Adenoma of large intestine 02/01/2016  . Clinical depression 02/01/2016  . 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 Benign essential HTN 02/01/2016  . 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/2016  . Pure hypercholesterolemia 02/01/2016  . Type 2 diabetes mellitus ([place]) 01/10/2016  . Microalbuminuria 01/10/2016  . Degeneration of intervertebral disc of lumbar region 07/13/2015  . Parkinson's disease ([place]) 11/16/2012   Edmundo Tedesco T  Tomasita Morrow, OTR/L, CLT  Preciliano Castell 11/06/2017, 2:39 PM  [place] MAIN [facility] SERVICES [address] [place], [place], [address] Phone: [phone]   Fax:  [phone]  Name: Randall [name] MRN: [id number] Date of Birth: 22-Nov-1950

## 2017-11-10 ENCOUNTER — Encounter: Payer: Self-pay | Admitting: Occupational Therapy

## 2017-11-10 ENCOUNTER — Encounter: Payer: Self-pay | Admitting: Speech Pathology

## 2017-11-10 ENCOUNTER — Ambulatory Visit: Payer: PPO | Attending: Neurology | Admitting: Occupational Therapy

## 2017-11-10 ENCOUNTER — Ambulatory Visit: Payer: PPO | Admitting: Speech Pathology

## 2017-11-10 DIAGNOSIS — R262 Difficulty in walking, not elsewhere classified: Secondary | ICD-10-CM | POA: Diagnosis not present

## 2017-11-10 DIAGNOSIS — R278 Other lack of coordination: Secondary | ICD-10-CM | POA: Diagnosis not present

## 2017-11-10 DIAGNOSIS — M6281 Muscle weakness (generalized): Secondary | ICD-10-CM

## 2017-11-10 DIAGNOSIS — R2681 Unsteadiness on feet: Secondary | ICD-10-CM | POA: Insufficient documentation

## 2017-11-10 DIAGNOSIS — R490 Dysphonia: Secondary | ICD-10-CM | POA: Diagnosis not present

## 2017-11-10 NOTE — Therapy (Signed)
[place] MAIN [facility] SERVICES [address] [place], [place], [address] Phone: [phone]   Fax:  [phone]  Speech Language Pathology Treatment  Patient Details  Name: Randall [name] MRN: [id number] Date of Birth: 02/01/1951 Referring Provider: Dr. [name]   Encounter Date: 11/10/2017  End of Session - 11/10/17 1555    Visit Number  13    Number of Visits  17    Date for SLP Re-Evaluation  11/14/17    SLP Start Time  61    SLP Stop Time   1450    SLP Time Calculation (min)  50 min    Activity Tolerance  Patient tolerated treatment well       Past Medical History:  Diagnosis Date  . Allergic   . Colon adenoma   . Depression   . Diabetes mellitus, type II ([place])   . Diverticulosis   . Hypertension   . Seizures ([place])   . Sleep apnea   . Urinary hesitancy     Past Surgical History:  Procedure Laterality Date  . COLON SURGERY    . HERNIA REPAIR    . UVULOPALATOPHARYNGOPLASTY, TONSILLECTOMY AND SEPTOPLASTY      There were no vitals filed for this visit.  Subjective Assessment - 11/10/17 1555    Subjective  "I'm trying"            ADULT SLP TREATMENT - 11/10/17 0001      General Information   Behavior/Cognition  Alert;Cooperative;Pleasant mood    HPI   67 year old man diagnosed with Parkinson's disease in 2016.  Patient states that he went through the LSVT LOUD and BIG in 2016.       Treatment Provided   Treatment provided  Cognitive-Linquistic      Pain Assessment   Pain Assessment  No/denies pain      Cognitive-Linquistic Treatment   Treatment focused on  Voice    Skilled Treatment  Daily Task #1 (Maximum sustained "ah"): Average 15 seconds, 85 dB. Daily Task 2 (Maximum fundamental frequency range): Highs: 15 high pitched "ah" given min cues. Lows: 15 low pitched "ah" given min cues. Daily task #3 (Maximum speech loudness drill of functional phrases): Average 75 dB.  Hierarchal speech loudness drill: Read  paragraphs, varies between 72 dB and 77 dB.  Generate short responses, 72 dB.  Conversational speech; 70 dB.  Homework: assignments completed.  Off the cuff remarks: average 70 dB, improves to 75 dB with SLP cues.      Assessment / Recommendations / Plan   Plan  Continue with current plan of care      Progression Toward Goals   Progression toward goals  Progressing toward goals       SLP Education - 11/10/17 1555    Education provided  Yes    Education Details  LSVT-LOUD, slowing rate    Person(s) Educated  Patient    Methods  Explanation    Comprehension  Verbalized understanding         SLP Long Term Goals - 10/13/17 1539      SLP LONG TERM GOAL #1   Title  The patient will complete Daily Tasks (Maximum duration "ah", High/Lows, and Functional Phrases) at average loudness of 80 dB and with loud, good quality voice.     Time  4    Period  Weeks    Status  New    Target Date  11/14/17      SLP LONG  TERM GOAL #2   Title  The patient will complete Hierarchal Speech Loudness reading drills (words/phrases, sentences, and paragraph) at average 75 dB and with loud, good quality voice.      Time  4    Period  Weeks    Status  New    Target Date  11/14/17      SLP LONG TERM GOAL #3   Title  The patient will participate in conversation, maintaining average loudness of 75 dB and loud, good quality voice.    Time  4    Period  Weeks    Status  New    Target Date  11/14/17      SLP LONG TERM GOAL #4   Title  The patient will complete homework daily.    Time  4    Period  Weeks    Status  New    Target Date  11/14/17       Plan - 11/10/17 1555    Clinical Impression Statement  The patient is completing daily tasks and hierarchal speech drill tasks with loud, good quality voice given fewer SLP cues.  Patient demonstrates improved control of pitch in singing, a favorite pastime of his.    Speech Therapy Frequency  4x / week    Duration  4 weeks    Treatment/Interventions   Patient/family education;Other (comment) LSVT-LOUD    Potential to Achieve Goals  Good    Potential Considerations  Ability to learn/carryover information;Severity of impairments;Co-morbidities;Cooperation/participation level;Family/community support;Medical prognosis;Previous level of function    SLP Home Exercise Plan  LSVT-LOUD daily homework    Consulted and Agree with Plan of Care  Patient       Patient will benefit from skilled therapeutic intervention in order to improve the following deficits and impairments:   Dysphonia    Problem List Patient Active Problem List   Diagnosis Date Noted  . Adenoma of large intestine 02/01/2016  . Clinical depression 02/01/2016  . 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 Benign essential HTN 02/01/2016  . 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/2016  . Pure hypercholesterolemia 02/01/2016  . Type 2 diabetes mellitus ([place]) 01/10/2016  . Microalbuminuria 01/10/2016  . Degeneration of intervertebral disc of lumbar region 07/13/2015  . Parkinson's disease ([place]) 11/16/2012   [name], [place], [name] 11/10/2017, 3:56 PM  [place] MAIN [facility] SERVICES [address] [place], [place], [address] Phone: [phone]   Fax:  [phone]   Name: Randall [name] MRN: [id number] Date of Birth: 28-Feb-1951

## 2017-11-11 ENCOUNTER — Ambulatory Visit: Payer: PPO | Admitting: Occupational Therapy

## 2017-11-11 ENCOUNTER — Ambulatory Visit: Payer: PPO | Admitting: Speech Pathology

## 2017-11-11 ENCOUNTER — Encounter: Payer: Self-pay | Admitting: Speech Pathology

## 2017-11-11 DIAGNOSIS — M6281 Muscle weakness (generalized): Secondary | ICD-10-CM

## 2017-11-11 DIAGNOSIS — R262 Difficulty in walking, not elsewhere classified: Secondary | ICD-10-CM

## 2017-11-11 DIAGNOSIS — R2681 Unsteadiness on feet: Secondary | ICD-10-CM

## 2017-11-11 DIAGNOSIS — R490 Dysphonia: Secondary | ICD-10-CM

## 2017-11-11 DIAGNOSIS — R278 Other lack of coordination: Secondary | ICD-10-CM

## 2017-11-11 NOTE — Therapy (Signed)
[place] MAIN [facility] SERVICES [address] [place], [place], [address] Phone: [phone]   Fax:  [phone]  Speech Language Pathology Treatment  Patient Details  Name: Randall [name] MRN: [id number] Date of Birth: [birth year] Referring Provider: Dr. [name]   Encounter Date: 11/11/2017  End of Session - 11/11/17 1659    Visit Number  14    Number of Visits  17    Date for SLP Re-Evaluation  11/14/17    SLP Start Time  1400    SLP Stop Time   1456    SLP Time Calculation (min)  56 min    Activity Tolerance  Patient tolerated treatment well       Past Medical History:  Diagnosis Date  . Allergic   . Colon adenoma   . Depression   . Diabetes mellitus, type II ([place])   . Diverticulosis   . Hypertension   . Seizures ([place])   . Sleep apnea   . Urinary hesitancy     Past Surgical History:  Procedure Laterality Date  . COLON SURGERY    . HERNIA REPAIR    . UVULOPALATOPHARYNGOPLASTY, TONSILLECTOMY AND SEPTOPLASTY      There were no vitals filed for this visit.  Subjective Assessment - 11/11/17 1658    Subjective  "I'm trying"            ADULT SLP TREATMENT - 11/11/17 0001      General Information   Behavior/Cognition  Alert;Cooperative;Pleasant mood    HPI   67 year old man diagnosed with Parkinson's disease in 2016.  Patient states that he went through the LSVT LOUD and BIG in 2016.       Treatment Provided   Treatment provided  Cognitive-Linquistic      Pain Assessment   Pain Assessment  No/denies pain      Cognitive-Linquistic Treatment   Treatment focused on  Voice    Skilled Treatment  Daily Task #1 (Maximum sustained "ah"): Average 15 seconds, 85 dB. Daily Task 2 (Maximum fundamental frequency range): Highs: 15 high pitched "ah" given min cues. Lows: 15 low pitched "ah" given min cues. Daily task #3 (Maximum speech loudness drill of functional phrases): Average 75 dB.  Hierarchal speech loudness drill: Read  paragraphs, varies between 72 dB and 77 dB.  Generate short responses, 72 dB.  Conversational speech; 70 dB.  Homework: assignments completed.  Off the cuff remarks: average 70 dB, improves to 75 dB with SLP cues.      Assessment / Recommendations / Plan   Plan  Continue with current plan of care      Progression Toward Goals   Progression toward goals  Progressing toward goals       SLP Education - 11/11/17 1659    Education provided  Yes    Education Details  LSVT-LOUD, slowing rate    Person(s) Educated  Patient    Methods  Explanation    Comprehension  Verbalized understanding         SLP Long Term Goals - 10/13/17 1539      SLP LONG TERM GOAL #1   Title  The patient will complete Daily Tasks (Maximum duration "ah", High/Lows, and Functional Phrases) at average loudness of 80 dB and with loud, good quality voice.     Time  4    Period  Weeks    Status  New    Target Date  11/14/17      SLP LONG  TERM GOAL #2   Title  The patient will complete Hierarchal Speech Loudness reading drills (words/phrases, sentences, and paragraph) at average 75 dB and with loud, good quality voice.      Time  4    Period  Weeks    Status  New    Target Date  11/14/17      SLP LONG TERM GOAL #3   Title  The patient will participate in conversation, maintaining average loudness of 75 dB and loud, good quality voice.    Time  4    Period  Weeks    Status  New    Target Date  11/14/17      SLP LONG TERM GOAL #4   Title  The patient will complete homework daily.    Time  4    Period  Weeks    Status  New    Target Date  11/14/17       Plan - 11/11/17 1659    Clinical Impression Statement  The patient is completing daily tasks and hierarchal speech drill tasks with loud, good quality voice given fewer SLP cues.  Patient demonstrates improved control of pitch in singing, a favorite pastime of his.    Speech Therapy Frequency  4x / week    Duration  4 weeks    Treatment/Interventions   Patient/family education;Other (comment) LSVT-LOUD    Potential to Achieve Goals  Good    Potential Considerations  Ability to learn/carryover information;Severity of impairments;Co-morbidities;Cooperation/participation level;Family/community support;Medical prognosis;Previous level of function    SLP Home Exercise Plan  LSVT-LOUD daily homework    Consulted and Agree with Plan of Care  Patient       Patient will benefit from skilled therapeutic intervention in order to improve the following deficits and impairments:   Dysphonia    Problem List Patient Active Problem List   Diagnosis Date Noted  . Adenoma of large intestine 02/01/2016  . Clinical depression 02/01/2016  . 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 Benign essential HTN 02/01/2016  . 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/2016  . Pure hypercholesterolemia 02/01/2016  . Type 2 diabetes mellitus ([place]) 01/10/2016  . Microalbuminuria 01/10/2016  . Degeneration of intervertebral disc of lumbar region 07/13/2015  . Parkinson's disease ([place]) 11/16/2012   Randall Sea, MS/CCC- SLP  Randall [name] 11/11/2017, 5:00 PM  [place] MAIN [facility] SERVICES [address] [place], [place], [address] Phone: [phone]   Fax:  [phone]   Name: Randall [name] MRN: [id number] Date of Birth: 1951/07/15

## 2017-11-12 ENCOUNTER — Ambulatory Visit: Payer: PPO | Admitting: Speech Pathology

## 2017-11-12 ENCOUNTER — Ambulatory Visit: Payer: PPO | Admitting: Occupational Therapy

## 2017-11-12 DIAGNOSIS — R262 Difficulty in walking, not elsewhere classified: Secondary | ICD-10-CM

## 2017-11-12 DIAGNOSIS — R2681 Unsteadiness on feet: Secondary | ICD-10-CM

## 2017-11-12 DIAGNOSIS — R278 Other lack of coordination: Secondary | ICD-10-CM

## 2017-11-12 DIAGNOSIS — M6281 Muscle weakness (generalized): Secondary | ICD-10-CM | POA: Diagnosis not present

## 2017-11-12 DIAGNOSIS — R490 Dysphonia: Secondary | ICD-10-CM

## 2017-11-12 NOTE — Therapy (Signed)
[place] MAIN [facility] SERVICES [address] [place], [place], [address] Phone: [phone]   Fax:  [phone]  Speech Language Pathology Treatment  Patient Details  Name: Randall [name] MRN: [id number] Date of Birth: Jan 17, 1951 Referring Provider: Dr. [name]   Encounter Date: 11/12/2017  End of Session - 11/12/17 1458    Visit Number  15    Number of Visits  17    Date for SLP Re-Evaluation  11/14/17    SLP Start Time  1400    SLP Stop Time   1454    SLP Time Calculation (min)  54 min    Activity Tolerance  Patient tolerated treatment well       Past Medical History:  Diagnosis Date  . Allergic   . Colon adenoma   . Depression   . Diabetes mellitus, type II ([place])   . Diverticulosis   . Hypertension   . Seizures ([place])   . Sleep apnea   . Urinary hesitancy     Past Surgical History:  Procedure Laterality Date  . COLON SURGERY    . HERNIA REPAIR    . UVULOPALATOPHARYNGOPLASTY, TONSILLECTOMY AND SEPTOPLASTY      There were no vitals filed for this visit.  Subjective Assessment - 11/12/17 1457    Subjective  Patient is a little subdued today, his brother is in the hospital             ADULT SLP TREATMENT - 11/12/17 0001      General Information   Behavior/Cognition  Alert;Cooperative;Pleasant mood    HPI   67 year old man diagnosed with Parkinson's disease in 2016.  Patient states that he went through the LSVT LOUD and BIG in 2016.       Treatment Provided   Treatment provided  Cognitive-Linquistic      Pain Assessment   Pain Assessment  No/denies pain      Cognitive-Linquistic Treatment   Treatment focused on  Voice    Skilled Treatment  Daily Task #1 (Maximum sustained "ah"): Average 15 seconds, 85 dB. Daily Task 2 (Maximum fundamental frequency range): Highs: 15 high pitched "ah" given min cues. Lows: 15 low pitched "ah" given min cues. Daily task #3 (Maximum speech loudness drill of functional phrases): Average 75  dB.  Hierarchal speech loudness drill: Read paragraphs, varies between 72 dB and 77 dB.  Generate short responses, 72 dB.  Conversational speech; 70 dB.  Homework: assignments completed.  Off the cuff remarks: average 70 dB, improves to 75 dB with SLP cues.      Assessment / Recommendations / Plan   Plan  Continue with current plan of care      Progression Toward Goals   Progression toward goals  Progressing toward goals       SLP Education - 11/12/17 1458    Education provided  Yes    Education Details  LSVT-LOUD, overaticulation with multi-syllabic words         SLP Long Term Goals - 10/13/17 1539      SLP LONG TERM GOAL #1   Title  The patient will complete Daily Tasks (Maximum duration "ah", High/Lows, and Functional Phrases) at average loudness of 80 dB and with loud, good quality voice.     Time  4    Period  Weeks    Status  New    Target Date  11/14/17      SLP LONG TERM GOAL #2   Title  The patient will complete Hierarchal Speech Loudness reading drills (words/phrases, sentences, and paragraph) at average 75 dB and with loud, good quality voice.      Time  4    Period  Weeks    Status  New    Target Date  11/14/17      SLP LONG TERM GOAL #3   Title  The patient will participate in conversation, maintaining average loudness of 75 dB and loud, good quality voice.    Time  4    Period  Weeks    Status  New    Target Date  11/14/17      SLP LONG TERM GOAL #4   Title  The patient will complete homework daily.    Time  4    Period  Weeks    Status  New    Target Date  11/14/17       Plan - 11/12/17 1459    Clinical Impression Statement  The patient is completing daily tasks and hierarchal speech drill tasks with loud, good quality voice given fewer SLP cues.  Patient demonstrates improved control of pitch in singing, a favorite pastime of his.    Speech Therapy Frequency  4x / week    Duration  4 weeks    Treatment/Interventions  Patient/family education;Other  (comment) LSVT-LOUD    Potential to Achieve Goals  Good    Potential Considerations  Ability to learn/carryover information;Severity of impairments;Co-morbidities;Cooperation/participation level;Family/community support;Medical prognosis;Previous level of function    SLP Home Exercise Plan  LSVT-LOUD daily homework    Consulted and Agree with Plan of Care  Patient       Patient will benefit from skilled therapeutic intervention in order to improve the following deficits and impairments:   Dysphonia    Problem List Patient Active Problem List   Diagnosis Date Noted  . Adenoma of large intestine 02/01/2016  . Clinical depression 02/01/2016  . 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 Benign essential HTN 02/01/2016  . 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/2016  . Pure hypercholesterolemia 02/01/2016  . Type 2 diabetes mellitus ([place]) 01/10/2016  . Microalbuminuria 01/10/2016  . Degeneration of intervertebral disc of lumbar region 07/13/2015  . Parkinson's disease ([place]) 11/16/2012   [name], [place], [name] 11/12/2017, 2:59 PM  [place] MAIN [facility] SERVICES [address] [place], [place], [address] Phone: [phone]   Fax:  [phone]   Name: Randall [name] MRN: [id number] Date of Birth: 1950/11/28

## 2017-11-13 ENCOUNTER — Ambulatory Visit: Payer: PPO | Admitting: Speech Pathology

## 2017-11-13 ENCOUNTER — Encounter: Payer: Self-pay | Admitting: Speech Pathology

## 2017-11-13 ENCOUNTER — Ambulatory Visit: Payer: PPO | Admitting: Occupational Therapy

## 2017-11-13 DIAGNOSIS — M6281 Muscle weakness (generalized): Secondary | ICD-10-CM

## 2017-11-13 DIAGNOSIS — R278 Other lack of coordination: Secondary | ICD-10-CM

## 2017-11-13 DIAGNOSIS — R2681 Unsteadiness on feet: Secondary | ICD-10-CM

## 2017-11-13 DIAGNOSIS — R262 Difficulty in walking, not elsewhere classified: Secondary | ICD-10-CM

## 2017-11-13 DIAGNOSIS — R490 Dysphonia: Secondary | ICD-10-CM

## 2017-11-13 NOTE — Therapy (Signed)
[place] MAIN [facility] SERVICES [address] [place], [place], [address] Phone: [phone]   Fax:  [phone]  Speech Language Pathology Treatment/Discharge Summary  Patient Details  Name: Randall [name] MRN: [id number] Date of Birth: 1951-02-28 Referring Provider: Dr. [name]   Encounter Date: 11/13/2017  End of Session - 11/13/17 1556    Visit Number  16    Number of Visits  17    Date for SLP Re-Evaluation  11/14/17    SLP Start Time  70    SLP Stop Time   1456    SLP Time Calculation (min)  56 min    Activity Tolerance  Patient tolerated treatment well       Past Medical History:  Diagnosis Date  . Allergic   . Colon adenoma   . Depression   . Diabetes mellitus, type II ([place])   . Diverticulosis   . Hypertension   . Seizures ([place])   . Sleep apnea   . Urinary hesitancy     Past Surgical History:  Procedure Laterality Date  . COLON SURGERY    . HERNIA REPAIR    . UVULOPALATOPHARYNGOPLASTY, TONSILLECTOMY AND SEPTOPLASTY      There were no vitals filed for this visit.  Subjective Assessment - 11/13/17 1555    Subjective  Patient states that he will continue to practice using his LOUD voice            ADULT SLP TREATMENT - 11/13/17 0001      General Information   Behavior/Cognition  Alert;Cooperative;Pleasant mood    HPI   67 year old man diagnosed with Parkinson's disease in 2016.  Patient states that he went through the LSVT LOUD and BIG in 2016.       Treatment Provided   Treatment provided  Cognitive-Linquistic      Pain Assessment   Pain Assessment  No/denies pain      Cognitive-Linquistic Treatment   Treatment focused on  Voice    Skilled Treatment  Daily Task #1 (Maximum sustained "ah"): Average 15 seconds, 85 dB. Daily Task 2 (Maximum fundamental frequency range): Highs: 15 high pitched "ah" given no cues. Lows: 15 low pitched "ah" given no cues. Daily task #3 (Maximum speech loudness drill of functional  phrases): Average 75 dB.  Hierarchal speech loudness drill: Read paragraphs, varies between 72 dB and 77 dB.  Generate short responses, 73 dB.  Conversational speech; 70 dB (75+ given reminders).  Homework: assignments completed.  Off the cuff remarks: average 70 dB, improves to 75 dB with occasional SLP cues.      Assessment / Recommendations / Plan   Plan  Discharge SLP treatment due to (comment)      Progression Toward Goals   Progression toward goals  Goals met, education completed, patient discharged from SLP       SLP Education - 11/13/17 1555    Education provided  Yes    Education Details  LSVT-LOUD, sing LOUD and TUNEFUL    Person(s) Educated  Patient    Methods  Explanation;Handout    Comprehension  Verbalized understanding         SLP Long Term Goals - 11/13/17 1557      SLP LONG TERM GOAL #1   Title  The patient will complete Daily Tasks (Maximum duration "ah", High/Lows, and Functional Phrases) at average loudness of 80 dB and with loud, good quality voice.     Status  Achieved  SLP LONG TERM GOAL #2   Title  The patient will complete Hierarchal Speech Loudness reading drills (words/phrases, sentences, and paragraph) at average 75 dB and with loud, good quality voice.      Status  Achieved      SLP LONG TERM GOAL #3   Title  The patient will participate in conversation, maintaining average loudness of 75 dB and loud, good quality voice.    Status  Achieved      SLP LONG TERM GOAL #4   Title  The patient will complete homework daily.    Status  Achieved       Plan - 11/13/17 1556    Clinical Impression Statement  The patient has completed the LSVT-LOUD program and is meeting his goals given occasional reminders to be loud.  He is completing daily tasks and hierarchal speech drill tasks with loud, good quality voice given occasional SLP cues.  Patient demonstrates improved control of pitch in singing, a favorite pastime of his.    Speech Therapy Frequency  4x  / week    Duration  4 weeks    Treatment/Interventions  Patient/family education;Other (comment) LSVT-LOUD    Potential to Achieve Goals  Good    Potential Considerations  Ability to learn/carryover information;Severity of impairments;Co-morbidities;Cooperation/participation level;Family/community support;Medical prognosis;Previous level of function    SLP Home Exercise Plan  LSVT-LOUD "forever" homework    Consulted and Agree with Plan of Care  Patient       Patient will benefit from skilled therapeutic intervention in order to improve the following deficits and impairments:   Dysphonia    Problem List Patient Active Problem List   Diagnosis Date Noted  . Adenoma of large intestine 02/01/2016  . Clinical depression 02/01/2016  . 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 Benign essential HTN 02/01/2016  . 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/2016  . Pure hypercholesterolemia 02/01/2016  . Type 2 diabetes mellitus ([place]) 01/10/2016  . Microalbuminuria 01/10/2016  . Degeneration of intervertebral disc of lumbar region 07/13/2015  . Parkinson's disease ([place]) 11/16/2012    [name] 11/13/2017, 3:58 PM  [place] [facility] MAIN [facility] SERVICES [address] [place], [place], [address] Phone: [phone]   Fax:  [phone]   Name: BURNHAM TROST MRN: [id number] Date of Birth: 06-30-1951

## 2017-11-18 ENCOUNTER — Encounter: Payer: Self-pay | Admitting: Occupational Therapy

## 2017-11-18 NOTE — Therapy (Signed)
[place] MAIN [facility] SERVICES [address] [place], [place], [address] Phone: [phone]   Fax:  [phone]  Occupational Therapy Treatment  Patient Details  Name: Randall [name] MRN: [id number] Date of Birth: Feb 18, 1951 Referring Provider: Joselyn Arrow   Encounter Date: 11/10/2017  OT End of Session - 11/17/17 1935    Visit Number  13    Number of Visits  17    Date for OT Re-Evaluation  11/21/17    OT Start Time  1300    OT Stop Time  1400    OT Time Calculation (min)  60 min    Activity Tolerance  Patient tolerated treatment well    Behavior During Therapy  [facility] for tasks assessed/performed       Past Medical History:  Diagnosis Date  . Allergic   . Colon adenoma   . Depression   . Diabetes mellitus, type II ([place])   . Diverticulosis   . Hypertension   . Seizures ([place])   . Sleep apnea   . Urinary hesitancy     Past Surgical History:  Procedure Laterality Date  . COLON SURGERY    . HERNIA REPAIR    . UVULOPALATOPHARYNGOPLASTY, TONSILLECTOMY AND SEPTOPLASTY      There were no vitals filed for this visit.  Subjective Assessment - 11/17/17 1929    Subjective   Patient reports he was able to do exercises over the weekend and is doing well.  Looking forward to graduation this week from therapy.    Pertinent History  Patient was diagnosed with Parkinson's about 8 years ago, he reports a recent increase in tremors in right hand especially when he feels stressed or cold (which is often).  Patient has had increased difficulty with walking, increased foot drag on the right, no recent falls. Increased difficulty in recent months with self care tasks.     Patient Stated Goals  Patient reports he would like to be independent, move better, feed self and not have tremors.    Currently in Pain?  No/denies    Pain Score  0-No pain                   OT Treatments/Exercises (OP) - 11/17/17 1930      ADLs   ADL Comments  Continued  focus on functional component tasks, able to complete sit to stand without arms from variety of surfaces, cues for posture.  Demonstrating reaching to each foot to don socks, patient able to demonstrate hand flicks prior to buttons and able to complete with improved speed.  Handwriting for name, address and phone number for multiple repetitions with larger lined paper for cues to increase letter size.       Neurological Re-education Exercises   Other Exercises 1  Patient seen for instruction of LSVT BIG exercises: LSVT Daily Session Maximal Daily Exercises: Sustained movements are designed to rescale the amplitude of movement output for generalization to daily functional activities. Performed as follows for 1 set of 10 repetitions each: Multi directional sustained movements- 1) Floor to ceiling, 2) Side to side. Multi directional Repetitive movements performed in standing and are designed to provide retraining effort needed for sustained muscle activation in tasks Performed as follows: 3) Step and reach forward, 4) Step and Reach Backwards, 5) Step and reach sideways, 6) Rock and reach forward/backward, 7) Rock and reach sideways. Requires SBA for all exercises in standing with Cues for arm positioning during exercises. Continued to focus  on increased complexity, occasional cues provided and SBA.      Other Exercises 2  Functional mobility skills for 1 trial of 600 feet with cues for posture and reciprocal arm swing.  Patient continues to require cues for heel to toe patterns on the right.              OT Education - 11/18/17 1935    Education provided  Yes    Education Details  LSVT BIG exercises, increasing complexity instructions.    Person(s) Educated  Patient    Methods  Explanation;Demonstration;Verbal cues;Handout    Comprehension  Verbal cues required;Returned demonstration;Verbalized understanding          OT Long Term Goals - 11/05/17 1659      OT LONG TERM GOAL #1   Title   Patient will complete HEP for maximal daily exercises with modified independence in 4 weeks      Baseline  no current home program    Time  4    Period  Weeks    Status  On-going      OT LONG TERM GOAL #2   Title  Patient will improve gait speed and endurance and be able to walk 1650 feet in 6 minutes with improved posture and increased amplitude of gait to negotiate around the home and community safely in 4 weeks.      Baseline  1550 feet at eval    Time  4    Period  Weeks    Status  On-going      OT LONG TERM GOAL #3   Title  Patient will perform sit to stand from lower and unstable surfaces with modified independence.       Baseline  difficulty with lower surfaces    Time  4    Period  Weeks    Status  On-going      OT LONG TERM GOAL #4   Title  Patient will complete handwriting with name and address with 75% legibility.     Baseline  50% legibility at eval    Time  4    Period  Weeks    Status  On-going      OT LONG TERM GOAL #5   Title  Patient will complete sock donning with modified independence consistently.     Baseline  difficulty with donning socks at times.     Time  4    Period  Weeks    Status  On-going            Plan - 11/17/17 1936    Clinical Impression Statement  Patient was able to complete exercises with SBA today, still requires occasional cues for amplitude of steps on the right, heel to toe step patterns and reciprocal arm swing.  Patient instructed on additional ways to increase complexity beyond alternating sides and issued handout.  Will continue to work on skills to improve balance, coordination and functional mobility skills and will plan for discharge at the end of this week.     Occupational Profile and client history currently impacting functional performance  progression of Parkinson's disease, decreased balance, increased right hand tremors, increased difficulty with self care tasks, difficulty walking    Occupational performance deficits  (Please refer to evaluation for details):  ADL's;IADL's;Leisure;Social Participation;Rest and Sleep    Rehab Potential  Good    Current Impairments/barriers affecting progress:  progressive disease, balance, posture, anxiety, memory    OT Frequency  4x / week  OT Duration  4 weeks    OT Treatment/Interventions  Self-care/ADL training;Moist Heat;DME and/or AE instruction;Balance training;Gait Training;Therapeutic activities;Therapeutic exercise;Stair Training;Cognitive remediation/compensation;Neuromuscular education;Functional Mobility Training;Patient/family education    Consulted and Agree with Plan of Care  Patient       Patient will benefit from skilled therapeutic intervention in order to improve the following deficits and impairments:  Abnormal gait, Decreased cognition, Decreased knowledge of use of DME, Decreased coordination, Decreased mobility, Improper body mechanics, Decreased activity tolerance, Decreased endurance, Decreased strength, Decreased balance, Difficulty walking, Impaired UE functional use  Visit Diagnosis: Muscle weakness (generalized)  Difficulty in walking, not elsewhere classified  Other lack of coordination  Unsteadiness on feet    Problem List Patient Active Problem List   Diagnosis Date Noted  . Adenoma of large intestine 02/01/2016  . Clinical depression 02/01/2016  . 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 Benign essential HTN 02/01/2016  . 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/2016  . Pure hypercholesterolemia 02/01/2016  . Type 2 diabetes mellitus ([place]) 01/10/2016  . Microalbuminuria 01/10/2016  . Degeneration of intervertebral disc of lumbar region 07/13/2015  . Parkinson's disease ([place]) 11/16/2012   Madaleine Simmon T Tomasita Morrow, OTR/L, CLT  Tasha Jindra 11/18/2017, 7:38 PM  [place] MAIN [facility] SERVICES [address] [place], [place], [address] Phone: [phone]   Fax:  [phone]  Name: Randall [name] MRN: [id number] Date of Birth: 04-10-1951

## 2017-11-18 NOTE — Therapy (Signed)
[place] MAIN [facility] SERVICES [address] [place], [place], [address] Phone: [phone]   Fax:  [phone]  Occupational Therapy Treatment  Patient Details  Name: Randall [name] MRN: [id number] Date of Birth: 12/24/1950 Referring Provider: Joselyn Arrow   Encounter Date: 11/11/2017  OT End of Session - 11/18/17 2031    Visit Number  14    Number of Visits  17    Date for OT Re-Evaluation  11/21/17    OT Start Time  1303    OT Stop Time  1400    OT Time Calculation (min)  57 min    Activity Tolerance  Patient tolerated treatment well    Behavior During Therapy  [facility] for tasks assessed/performed       Past Medical History:  Diagnosis Date  . Allergic   . Colon adenoma   . Depression   . Diabetes mellitus, type II ([place])   . Diverticulosis   . Hypertension   . Seizures ([place])   . Sleep apnea   . Urinary hesitancy     Past Surgical History:  Procedure Laterality Date  . COLON SURGERY    . HERNIA REPAIR    . UVULOPALATOPHARYNGOPLASTY, TONSILLECTOMY AND SEPTOPLASTY      There were no vitals filed for this visit.  Subjective Assessment - 11/18/17 2028    Subjective   Patient with no complaints, continues to work on exercises at home.     Pertinent History  Patient was diagnosed with Parkinson's about 8 years ago, he reports a recent increase in tremors in right hand especially when he feels stressed or cold (which is often).  Patient has had increased difficulty with walking, increased foot drag on the right, no recent falls. Increased difficulty in recent months with self care tasks.     Patient Stated Goals  Patient reports he would like to be independent, move better, feed self and not have tremors.    Currently in Pain?  No/denies    Pain Score  0-No pain    Multiple Pain Sites  No                   OT Treatments/Exercises (OP) - 11/17/17 1930      ADLs   ADL Comments  Continued focus on functional component tasks,  able to complete sit to stand without arms from variety of surfaces, cues for posture.  Demonstrating reaching to each foot to don socks, patient able to demonstrate hand flicks prior to buttons and able to complete with improved speed.  Handwriting for name, address and phone number for multiple repetitions with larger lined paper for cues to increase letter size.       Neurological Re-education Exercises   Other Exercises 1  Patient seen for instruction of LSVT BIG exercises: LSVT Daily Session Maximal Daily Exercises: Sustained movements are designed to rescale the amplitude of movement output for generalization to daily functional activities. Performed as follows for 1 set of 10 repetitions each: Multi directional sustained movements- 1) Floor to ceiling, 2) Side to side. Multi directional Repetitive movements performed in standing and are designed to provide retraining effort needed for sustained muscle activation in tasks Performed as follows: 3) Step and reach forward, 4) Step and Reach Backwards, 5) Step and reach sideways, 6) Rock and reach forward/backward, 7) Rock and reach sideways. Requires SBA for all exercises in standing with Cues for arm positioning during exercises. Continued to focus on increased complexity, occasional  cues provided and SBA.      Other Exercises 2  Functional mobility skills for 1 trial of 600 feet with cues for posture and reciprocal arm swing.  Patient continues to require cues for heel to toe patterns on the right.              OT Education - 11/18/17 2029    Education provided  Yes    Education Details  HEP, alternating sides with exercises.     Person(s) Educated  Patient    Methods  Explanation;Demonstration;Verbal cues    Comprehension  Verbal cues required;Returned demonstration;Verbalized understanding          OT Long Term Goals - 11/05/17 1659      OT LONG TERM GOAL #1   Title  Patient will complete HEP for maximal daily exercises with  modified independence in 4 weeks      Baseline  no current home program    Time  4    Period  Weeks    Status  On-going      OT LONG TERM GOAL #2   Title  Patient will improve gait speed and endurance and be able to walk 1650 feet in 6 minutes with improved posture and increased amplitude of gait to negotiate around the home and community safely in 4 weeks.      Baseline  1550 feet at eval    Time  4    Period  Weeks    Status  On-going      OT LONG TERM GOAL #3   Title  Patient will perform sit to stand from lower and unstable surfaces with modified independence.       Baseline  difficulty with lower surfaces    Time  4    Period  Weeks    Status  On-going      OT LONG TERM GOAL #4   Title  Patient will complete handwriting with name and address with 75% legibility.     Baseline  50% legibility at eval    Time  4    Period  Weeks    Status  On-going      OT LONG TERM GOAL #5   Title  Patient will complete sock donning with modified independence consistently.     Baseline  difficulty with donning socks at times.     Time  4    Period  Weeks    Status  On-going            Plan - 11/18/17 2034    Clinical Impression Statement  Patient continues to work towards improving daily exercises with advancement of alternating sides. Patient still requires cues for heel to toe stepping patterns with functional mobility, especially when he is distracted he tends to revert back to old patterns.  Continue to reinforce amplitude of steps.     Occupational Profile and client history currently impacting functional performance  progression of Parkinson's disease, decreased balance, increased right hand tremors, increased difficulty with self care tasks, difficulty walking    Occupational performance deficits (Please refer to evaluation for details):  ADL's;IADL's;Leisure;Social Participation;Rest and Sleep    Rehab Potential  Good    Current Impairments/barriers affecting progress:   progressive disease, balance, posture, anxiety, memory    OT Frequency  4x / week    OT Duration  4 weeks    OT Treatment/Interventions  Self-care/ADL training;Moist Heat;DME and/or AE instruction;Balance training;Gait Training;Therapeutic activities;Therapeutic exercise;Stair Training;Cognitive remediation/compensation;Neuromuscular education;Functional Mobility Training;Patient/family  education    Consulted and Agree with Plan of Care  Patient       Patient will benefit from skilled therapeutic intervention in order to improve the following deficits and impairments:  Abnormal gait, Decreased cognition, Decreased knowledge of use of DME, Decreased coordination, Decreased mobility, Improper body mechanics, Decreased activity tolerance, Decreased endurance, Decreased strength, Decreased balance, Difficulty walking, Impaired UE functional use  Visit Diagnosis: Muscle weakness (generalized)  Difficulty in walking, not elsewhere classified  Other lack of coordination  Unsteadiness on feet    Problem List Patient Active Problem List   Diagnosis Date Noted  . Adenoma of large intestine 02/01/2016  . Clinical depression 02/01/2016  . 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 Benign essential HTN 02/01/2016  . 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/2016  . Pure hypercholesterolemia 02/01/2016  . Type 2 diabetes mellitus ([place]) 01/10/2016  . Microalbuminuria 01/10/2016  . Degeneration of intervertebral disc of lumbar region 07/13/2015  . Parkinson's disease ([place]) 11/16/2012   Corinthian Mizrahi T Tomasita Morrow, OTR/L, CLT  Avi Kerschner 11/18/2017, 8:53 PM  [place] MAIN [facility] SERVICES [address] [place], [place], [address] Phone: [phone]   Fax:  [phone]  Name: Randall [name] MRN: [id number] Date of Birth: 04-07-1951

## 2017-11-18 NOTE — Therapy (Signed)
[place] MAIN [facility] SERVICES [address] [place], [place], [address] Phone: [phone]   Fax:  [phone]  Occupational Therapy Treatment  Patient Details  Name: Randall [name] MRN: [id number] Date of Birth: 05-19-1951 Referring Provider: Joselyn Arrow   Encounter Date: 11/12/2017  OT End of Session - 11/18/17 2121    Visit Number  15    Number of Visits  17    Date for OT Re-Evaluation  11/21/17    OT Start Time  1300    OT Stop Time  1400    OT Time Calculation (min)  60 min    Activity Tolerance  Patient tolerated treatment well    Behavior During Therapy  [facility] for tasks assessed/performed       Past Medical History:  Diagnosis Date  . Allergic   . Colon adenoma   . Depression   . Diabetes mellitus, type II ([place])   . Diverticulosis   . Hypertension   . Seizures ([place])   . Sleep apnea   . Urinary hesitancy     Past Surgical History:  Procedure Laterality Date  . COLON SURGERY    . HERNIA REPAIR    . UVULOPALATOPHARYNGOPLASTY, TONSILLECTOMY AND SEPTOPLASTY      There were no vitals filed for this visit.  Subjective Assessment - 11/18/17 2117    Subjective   Patient reports he is pleased with progress and looking forward to finishing up this week.     Pertinent History  Patient was diagnosed with Parkinson's about 8 years ago, he reports a recent increase in tremors in right hand especially when he feels stressed or cold (which is often).  Patient has had increased difficulty with walking, increased foot drag on the right, no recent falls. Increased difficulty in recent months with self care tasks.     Patient Stated Goals  Patient reports he would like to be independent, move better, feed self and not have tremors.    Currently in Pain?  No/denies    Pain Score  0-No pain    Multiple Pain Sites  No                   OT Treatments/Exercises (OP) - 11/18/17 2117      ADLs   ADL Comments  functional component tasks,  able to complete sit to stand without arms from variety of surfaces, cues for posture.  Demonstrating reaching to each foot to don socks, patient able to demonstrate hand flicks prior to buttons and able to complete with improved speed.  Handwriting for name, address and phone number for multiple repetitions with larger lined paper for cues to increase letter size.   Worked on putting component tasks into hierarchy tasks with cues      Neurological Re-education Exercises   Other Exercises 1  Patient seen for instruction of LSVT BIG exercises: LSVT Daily Session Maximal Daily Exercises: Sustained movements are designed to rescale the amplitude of movement output for generalization to daily functional activities. Performed as follows for 1 set of 10 repetitions each: Multi directional sustained movements- 1) Floor to ceiling, 2) Side to side. Multi directional Repetitive movements performed in standing and are designed to provide retraining effort needed for sustained muscle activation in tasks Performed as follows: 3) Step and reach forward, 4) Step and Reach Backwards, 5) Step and reach sideways, 6) Rock and reach forward/backward, 7) Rock and reach sideways. Requires SBA for all exercises in standing with Cues  for arm positioning during exercises.     Other Exercises 2  Functional mobility for one trial of 800 feet with cues for amplitude of steps, reciprocal arm swing and quality of steps.               OT Education - 11/18/17 2120    Education provided  Yes    Education Details  LSVT BIG exercises    Person(s) Educated  Patient    Methods  Explanation;Demonstration;Verbal cues    Comprehension  Verbal cues required;Returned demonstration;Verbalized understanding          OT Long Term Goals - 11/05/17 1659      OT LONG TERM GOAL #1   Title  Patient will complete HEP for maximal daily exercises with modified independence in 4 weeks      Baseline  no current home program    Time  4     Period  Weeks    Status  On-going      OT LONG TERM GOAL #2   Title  Patient will improve gait speed and endurance and be able to walk 1650 feet in 6 minutes with improved posture and increased amplitude of gait to negotiate around the home and community safely in 4 weeks.      Baseline  1550 feet at eval    Time  4    Period  Weeks    Status  On-going      OT LONG TERM GOAL #3   Title  Patient will perform sit to stand from lower and unstable surfaces with modified independence.       Baseline  difficulty with lower surfaces    Time  4    Period  Weeks    Status  On-going      OT LONG TERM GOAL #4   Title  Patient will complete handwriting with name and address with 75% legibility.     Baseline  50% legibility at eval    Time  4    Period  Weeks    Status  On-going      OT LONG TERM GOAL #5   Title  Patient will complete sock donning with modified independence consistently.     Baseline  difficulty with donning socks at times.     Time  4    Period  Weeks    Status  On-going            Plan - 11/18/17 2121    Clinical Impression Statement  Will plan to discharge patient next session, will redo all outcome measures and update goals/progress.  Will finalize home program for forever exercises. Continue to work towards goals to improve balance, functional mobility, and independence in daily tasks.     Occupational Profile and client history currently impacting functional performance  progression of Parkinson's disease, decreased balance, increased right hand tremors, increased difficulty with self care tasks, difficulty walking    Occupational performance deficits (Please refer to evaluation for details):  ADL's;IADL's;Leisure;Social Participation;Rest and Sleep    Rehab Potential  Good    Current Impairments/barriers affecting progress:  progressive disease, balance, posture, anxiety, memory    OT Frequency  4x / week    OT Duration  4 weeks    OT Treatment/Interventions   Self-care/ADL training;Moist Heat;DME and/or AE instruction;Balance training;Gait Training;Therapeutic activities;Therapeutic exercise;Stair Training;Cognitive remediation/compensation;Neuromuscular education;Functional Mobility Training;Patient/family education    Consulted and Agree with Plan of Care  Patient       Patient  will benefit from skilled therapeutic intervention in order to improve the following deficits and impairments:  Abnormal gait, Decreased cognition, Decreased knowledge of use of DME, Decreased coordination, Decreased mobility, Improper body mechanics, Decreased activity tolerance, Decreased endurance, Decreased strength, Decreased balance, Difficulty walking, Impaired UE functional use  Visit Diagnosis: Muscle weakness (generalized)  Difficulty in walking, not elsewhere classified  Other lack of coordination  Unsteadiness on feet    Problem List Patient Active Problem List   Diagnosis Date Noted  . Adenoma of large intestine 02/01/2016  . Clinical depression 02/01/2016  . 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 Benign essential HTN 02/01/2016  . 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/2016  . Pure hypercholesterolemia 02/01/2016  . Type 2 diabetes mellitus ([place]) 01/10/2016  . Microalbuminuria 01/10/2016  . Degeneration of intervertebral disc of lumbar region 07/13/2015  . Parkinson's disease ([place]) 11/16/2012   Staisha Winiarski T Tomasita Morrow, OTR/L, CLT  Chip Canepa 11/18/2017, 9:24 PM  [place] MAIN [facility] SERVICES [address] [place], [place], [address] Phone: [phone]   Fax:  [phone]  Name: Randall [name] MRN: [id number] Date of Birth: 01-25-1951

## 2017-11-19 NOTE — Therapy (Signed)
[place] MAIN [facility] SERVICES [address] [place], [place], [address] Phone: [phone]   Fax:  [phone]  Occupational Therapy Treatment/Discharge Summary   Patient Details  Name: Randall [name] MRN: [id number] Date of Birth: 1951-04-10 Referring Provider: Joselyn Arrow   Encounter Date: 11/13/2017  OT End of Session - 11/18/17 2141    Visit Number  16    Number of Visits  17    Date for OT Re-Evaluation  11/21/17    OT Start Time  1301    OT Stop Time  1400    OT Time Calculation (min)  59 min    Activity Tolerance  Patient tolerated treatment well    Behavior During Therapy  [facility] for tasks assessed/performed       Past Medical History:  Diagnosis Date  . Allergic   . Colon adenoma   . Depression   . Diabetes mellitus, type II ([place])   . Diverticulosis   . Hypertension   . Seizures ([place])   . Sleep apnea   . Urinary hesitancy     Past Surgical History:  Procedure Laterality Date  . COLON SURGERY    . HERNIA REPAIR    . UVULOPALATOPHARYNGOPLASTY, TONSILLECTOMY AND SEPTOPLASTY      There were no vitals filed for this visit.  Subjective Assessment - 11/18/17 2139    Subjective   Patient reports he is happy he completed the program and continues to be pleased with his progress, "I am doing so much better than I did the first day"    Patient Stated Goals  Patient reports he would like to be independent, move better, feed self and not have tremors.    Currently in Pain?  No/denies    Pain Score  0-No pain    Multiple Pain Sites  No                   OT Treatments/Exercises (OP) - 11/19/17 1818      Neurological Re-education Exercises   Other Exercises 1  Patient seen for instruction of LSVT BIG exercises: LSVT Daily Session Maximal Daily Exercises: Sustained movements are designed to rescale the amplitude of movement output for generalization to daily functional activities. Performed as follows for 1 set of 10  repetitions each: Multi directional sustained movements- 1) Floor to ceiling, 2) Side to side. Multi directional Repetitive movements performed in standing and are designed to provide retraining effort needed for sustained muscle activation in tasks Performed as follows: 3) Step and reach forward, 4) Step and Reach Backwards, 5) Step and reach sideways, 6) Rock and reach forward/backward, 7) Rock and reach sideways.     Other Exercises 2  Reassessment of skills as follows:  6 minute walk test went from 1550 feet to 1730 feet.  5 times sit to stand 10 secs., 9 hole peg test right 35 sec, left 30 secs.              OT Education - 11/18/17 2140    Education provided  Yes    Education Details  forever exercises    Person(s) Educated  Patient    Methods  Explanation;Demonstration;Verbal cues    Comprehension  Verbal cues required;Returned demonstration;Verbalized understanding          OT Long Term Goals - 11/18/17 2142      OT LONG TERM GOAL #1   Title  Patient will complete HEP for maximal daily exercises with modified independence in 4  weeks      Baseline  no current home program    Time  4    Period  Weeks    Status  Achieved      OT LONG TERM GOAL #2   Title  Patient will improve gait speed and endurance and be able to walk 1650 feet in 6 minutes with improved posture and increased amplitude of gait to negotiate around the home and community safely in 4 weeks.      Baseline  1550 feet at eval    Time  4    Period  Weeks    Status  Achieved      OT LONG TERM GOAL #3   Title  Patient will perform sit to stand from lower and unstable surfaces with modified independence.       Baseline  difficulty with lower surfaces    Time  4    Period  Weeks    Status  Achieved      OT LONG TERM GOAL #4   Title  Patient will complete handwriting with name and address with 75% legibility.     Baseline  50% legibility at eval    Time  4    Period  Weeks    Status  Achieved      OT  LONG TERM GOAL #5   Title  Patient will complete sock donning with modified independence consistently.     Baseline  difficulty with donning socks at times.     Time  4    Period  Weeks    Status  Achieved            Plan - 11/18/17 2141    Clinical Impression Statement  Patient completed LSVT BIG program and met goals.  His 6 minute walk test improved from 1550 feet to 1730 feet with improved quality of gait, reciprocal arm swing and developing heel to toe pattern on the right.  He is independent with performing HEP program and realizes he needs to continue these daily for a minimum of one time a day.   He made excellent progress and may benefit from therapy in the future as his disease progresses and/or changes with gait occur. Will discharge this date.     Occupational Profile and client history currently impacting functional performance  progression of Parkinson's disease, decreased balance, increased right hand tremors, increased difficulty with self care tasks, difficulty walking    Occupational performance deficits (Please refer to evaluation for details):  ADL's;IADL's;Leisure;Social Participation;Rest and Sleep    Rehab Potential  Good    Current Impairments/barriers affecting progress:  progressive disease, balance, posture, anxiety, memory    OT Frequency  4x / week    OT Duration  4 weeks    OT Treatment/Interventions  Self-care/ADL training;Moist Heat;DME and/or AE instruction;Balance training;Gait Training;Therapeutic activities;Therapeutic exercise;Stair Training;Cognitive remediation/compensation;Neuromuscular education;Functional Mobility Training;Patient/family education    Consulted and Agree with Plan of Care  Patient       Patient will benefit from skilled therapeutic intervention in order to improve the following deficits and impairments:  Abnormal gait, Decreased cognition, Decreased knowledge of use of DME, Decreased coordination, Decreased mobility, Improper body  mechanics, Decreased activity tolerance, Decreased endurance, Decreased strength, Decreased balance, Difficulty walking, Impaired UE functional use  Visit Diagnosis: Muscle weakness (generalized)  Difficulty in walking, not elsewhere classified  Other lack of coordination  Unsteadiness on feet    Problem List Patient Active Problem List   Diagnosis Date Noted  .  Adenoma of large intestine 02/01/2016  . Clinical depression 02/01/2016  . 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 Benign essential HTN 02/01/2016  . 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/2016  . Pure hypercholesterolemia 02/01/2016  . Type 2 diabetes mellitus ([place]) 01/10/2016  . Microalbuminuria 01/10/2016  . Degeneration of intervertebral disc of lumbar region 07/13/2015  . Parkinson's disease ([place]) 11/16/2012   Dola Lunsford T Tomasita Morrow, OTR/L, CLT  Floride Hutmacher 11/19/2017, 6:22 PM  [place] MAIN [facility] SERVICES [address] [place], [place], [address] Phone: [phone]   Fax:  [phone]  Name: Randall [name] MRN: [id number] Date of Birth: [birth year]

## 2017-11-25 DIAGNOSIS — G20 Parkinson's disease: Secondary | ICD-10-CM | POA: Diagnosis not present

## 2017-11-25 DIAGNOSIS — R4189 Other symptoms and signs involving cognitive functions and awareness: Secondary | ICD-10-CM | POA: Diagnosis not present

## 2017-12-16 DIAGNOSIS — E1142 Type 2 diabetes mellitus with diabetic polyneuropathy: Secondary | ICD-10-CM | POA: Diagnosis not present

## 2017-12-16 DIAGNOSIS — L851 Acquired keratosis [keratoderma] palmaris et plantaris: Secondary | ICD-10-CM | POA: Diagnosis not present

## 2017-12-16 DIAGNOSIS — B351 Tinea unguium: Secondary | ICD-10-CM | POA: Diagnosis not present

## 2018-01-23 DIAGNOSIS — I10 Essential (primary) hypertension: Secondary | ICD-10-CM | POA: Diagnosis not present

## 2018-01-23 DIAGNOSIS — M5136 Other intervertebral disc degeneration, lumbar region: Secondary | ICD-10-CM | POA: Diagnosis not present

## 2018-01-23 DIAGNOSIS — E118 Type 2 diabetes mellitus with unspecified complications: Secondary | ICD-10-CM | POA: Diagnosis not present

## 2018-01-23 DIAGNOSIS — E7800 Pure hypercholesterolemia, unspecified: Secondary | ICD-10-CM | POA: Diagnosis not present

## 2018-01-23 DIAGNOSIS — R809 Proteinuria, unspecified: Secondary | ICD-10-CM | POA: Diagnosis not present

## 2018-01-28 DIAGNOSIS — E119 Type 2 diabetes mellitus without complications: Secondary | ICD-10-CM | POA: Diagnosis not present

## 2018-01-30 DIAGNOSIS — E118 Type 2 diabetes mellitus with unspecified complications: Secondary | ICD-10-CM | POA: Diagnosis not present

## 2018-01-30 DIAGNOSIS — E7800 Pure hypercholesterolemia, unspecified: Secondary | ICD-10-CM | POA: Diagnosis not present

## 2018-01-30 DIAGNOSIS — G20 Parkinson's disease: Secondary | ICD-10-CM | POA: Diagnosis not present

## 2018-01-30 DIAGNOSIS — M5136 Other intervertebral disc degeneration, lumbar region: Secondary | ICD-10-CM | POA: Diagnosis not present

## 2018-01-30 DIAGNOSIS — F3340 Major depressive disorder, recurrent, in remission, unspecified: Secondary | ICD-10-CM | POA: Diagnosis not present

## 2018-01-30 DIAGNOSIS — I10 Essential (primary) hypertension: Secondary | ICD-10-CM | POA: Diagnosis not present

## 2018-01-30 DIAGNOSIS — B351 Tinea unguium: Secondary | ICD-10-CM | POA: Diagnosis not present

## 2018-01-30 DIAGNOSIS — R809 Proteinuria, unspecified: Secondary | ICD-10-CM | POA: Diagnosis not present

## 2018-01-30 DIAGNOSIS — G4730 Sleep apnea, unspecified: Secondary | ICD-10-CM | POA: Diagnosis not present

## 2018-01-30 DIAGNOSIS — G4710 Hypersomnia, unspecified: Secondary | ICD-10-CM | POA: Diagnosis not present

## 2018-01-30 DIAGNOSIS — Z125 Encounter for screening for malignant neoplasm of prostate: Secondary | ICD-10-CM | POA: Diagnosis not present

## 2018-01-30 DIAGNOSIS — E1169 Type 2 diabetes mellitus with other specified complication: Secondary | ICD-10-CM | POA: Diagnosis not present

## 2018-03-30 DIAGNOSIS — B351 Tinea unguium: Secondary | ICD-10-CM | POA: Diagnosis not present

## 2018-03-30 DIAGNOSIS — E1142 Type 2 diabetes mellitus with diabetic polyneuropathy: Secondary | ICD-10-CM | POA: Diagnosis not present

## 2018-04-27 DIAGNOSIS — R2689 Other abnormalities of gait and mobility: Secondary | ICD-10-CM | POA: Diagnosis not present

## 2018-04-27 DIAGNOSIS — F329 Major depressive disorder, single episode, unspecified: Secondary | ICD-10-CM | POA: Diagnosis not present

## 2018-04-27 DIAGNOSIS — R001 Bradycardia, unspecified: Secondary | ICD-10-CM | POA: Diagnosis not present

## 2018-04-27 DIAGNOSIS — G4700 Insomnia, unspecified: Secondary | ICD-10-CM | POA: Diagnosis not present

## 2018-04-27 DIAGNOSIS — G20 Parkinson's disease: Secondary | ICD-10-CM | POA: Diagnosis not present

## 2018-04-27 DIAGNOSIS — R4189 Other symptoms and signs involving cognitive functions and awareness: Secondary | ICD-10-CM | POA: Diagnosis not present

## 2018-05-04 DIAGNOSIS — E7800 Pure hypercholesterolemia, unspecified: Secondary | ICD-10-CM | POA: Diagnosis not present

## 2018-05-04 DIAGNOSIS — G4730 Sleep apnea, unspecified: Secondary | ICD-10-CM | POA: Diagnosis not present

## 2018-05-04 DIAGNOSIS — G4710 Hypersomnia, unspecified: Secondary | ICD-10-CM | POA: Diagnosis not present

## 2018-05-04 DIAGNOSIS — R001 Bradycardia, unspecified: Secondary | ICD-10-CM | POA: Diagnosis not present

## 2018-05-04 DIAGNOSIS — I10 Essential (primary) hypertension: Secondary | ICD-10-CM | POA: Diagnosis not present

## 2018-05-04 DIAGNOSIS — G20 Parkinson's disease: Secondary | ICD-10-CM | POA: Diagnosis not present

## 2018-05-04 DIAGNOSIS — R0602 Shortness of breath: Secondary | ICD-10-CM | POA: Diagnosis not present

## 2018-05-14 DIAGNOSIS — R0602 Shortness of breath: Secondary | ICD-10-CM | POA: Diagnosis not present

## 2018-05-27 DIAGNOSIS — E118 Type 2 diabetes mellitus with unspecified complications: Secondary | ICD-10-CM | POA: Diagnosis not present

## 2018-05-27 DIAGNOSIS — I10 Essential (primary) hypertension: Secondary | ICD-10-CM | POA: Diagnosis not present

## 2018-05-27 DIAGNOSIS — G20 Parkinson's disease: Secondary | ICD-10-CM | POA: Diagnosis not present

## 2018-05-27 DIAGNOSIS — E7800 Pure hypercholesterolemia, unspecified: Secondary | ICD-10-CM | POA: Diagnosis not present

## 2018-05-27 DIAGNOSIS — G4710 Hypersomnia, unspecified: Secondary | ICD-10-CM | POA: Diagnosis not present

## 2018-05-27 DIAGNOSIS — D126 Benign neoplasm of colon, unspecified: Secondary | ICD-10-CM | POA: Diagnosis not present

## 2018-05-27 DIAGNOSIS — G4730 Sleep apnea, unspecified: Secondary | ICD-10-CM | POA: Diagnosis not present

## 2018-07-08 DIAGNOSIS — L851 Acquired keratosis [keratoderma] palmaris et plantaris: Secondary | ICD-10-CM | POA: Diagnosis not present

## 2018-07-08 DIAGNOSIS — B351 Tinea unguium: Secondary | ICD-10-CM | POA: Diagnosis not present

## 2018-07-08 DIAGNOSIS — E1142 Type 2 diabetes mellitus with diabetic polyneuropathy: Secondary | ICD-10-CM | POA: Diagnosis not present

## 2018-07-14 DIAGNOSIS — Z125 Encounter for screening for malignant neoplasm of prostate: Secondary | ICD-10-CM | POA: Diagnosis not present

## 2018-07-14 DIAGNOSIS — E118 Type 2 diabetes mellitus with unspecified complications: Secondary | ICD-10-CM | POA: Diagnosis not present

## 2018-07-14 DIAGNOSIS — G4730 Sleep apnea, unspecified: Secondary | ICD-10-CM | POA: Diagnosis not present

## 2018-07-14 DIAGNOSIS — M5136 Other intervertebral disc degeneration, lumbar region: Secondary | ICD-10-CM | POA: Diagnosis not present

## 2018-07-14 DIAGNOSIS — G4710 Hypersomnia, unspecified: Secondary | ICD-10-CM | POA: Diagnosis not present

## 2018-07-14 DIAGNOSIS — E7800 Pure hypercholesterolemia, unspecified: Secondary | ICD-10-CM | POA: Diagnosis not present

## 2018-07-21 DIAGNOSIS — E1169 Type 2 diabetes mellitus with other specified complication: Secondary | ICD-10-CM | POA: Diagnosis not present

## 2018-07-21 DIAGNOSIS — B351 Tinea unguium: Secondary | ICD-10-CM | POA: Diagnosis not present

## 2018-07-21 DIAGNOSIS — R809 Proteinuria, unspecified: Secondary | ICD-10-CM | POA: Diagnosis not present

## 2018-07-21 DIAGNOSIS — I10 Essential (primary) hypertension: Secondary | ICD-10-CM | POA: Diagnosis not present

## 2018-07-21 DIAGNOSIS — Z7982 Long term (current) use of aspirin: Secondary | ICD-10-CM | POA: Diagnosis not present

## 2018-07-21 DIAGNOSIS — F3340 Major depressive disorder, recurrent, in remission, unspecified: Secondary | ICD-10-CM | POA: Diagnosis not present

## 2018-07-21 DIAGNOSIS — G4710 Hypersomnia, unspecified: Secondary | ICD-10-CM | POA: Diagnosis not present

## 2018-07-21 DIAGNOSIS — M5136 Other intervertebral disc degeneration, lumbar region: Secondary | ICD-10-CM | POA: Diagnosis not present

## 2018-07-21 DIAGNOSIS — Z0000 Encounter for general adult medical examination without abnormal findings: Secondary | ICD-10-CM | POA: Diagnosis not present

## 2018-07-21 DIAGNOSIS — Z23 Encounter for immunization: Secondary | ICD-10-CM | POA: Diagnosis not present

## 2018-07-21 DIAGNOSIS — G20 Parkinson's disease: Secondary | ICD-10-CM | POA: Diagnosis not present

## 2018-07-21 DIAGNOSIS — E7800 Pure hypercholesterolemia, unspecified: Secondary | ICD-10-CM | POA: Diagnosis not present

## 2018-07-21 DIAGNOSIS — E118 Type 2 diabetes mellitus with unspecified complications: Secondary | ICD-10-CM | POA: Diagnosis not present

## 2018-08-21 DIAGNOSIS — G20 Parkinson's disease: Secondary | ICD-10-CM | POA: Diagnosis not present

## 2018-08-21 DIAGNOSIS — R001 Bradycardia, unspecified: Secondary | ICD-10-CM | POA: Diagnosis not present

## 2018-08-21 DIAGNOSIS — R4189 Other symptoms and signs involving cognitive functions and awareness: Secondary | ICD-10-CM | POA: Diagnosis not present

## 2018-08-21 DIAGNOSIS — R2689 Other abnormalities of gait and mobility: Secondary | ICD-10-CM | POA: Diagnosis not present

## 2018-08-21 DIAGNOSIS — F329 Major depressive disorder, single episode, unspecified: Secondary | ICD-10-CM | POA: Diagnosis not present

## 2018-08-21 DIAGNOSIS — G4700 Insomnia, unspecified: Secondary | ICD-10-CM | POA: Diagnosis not present

## 2018-10-12 DIAGNOSIS — B351 Tinea unguium: Secondary | ICD-10-CM | POA: Diagnosis not present

## 2018-10-12 DIAGNOSIS — L851 Acquired keratosis [keratoderma] palmaris et plantaris: Secondary | ICD-10-CM | POA: Diagnosis not present

## 2018-10-12 DIAGNOSIS — E1142 Type 2 diabetes mellitus with diabetic polyneuropathy: Secondary | ICD-10-CM | POA: Diagnosis not present

## 2019-02-02 DIAGNOSIS — E118 Type 2 diabetes mellitus with unspecified complications: Secondary | ICD-10-CM | POA: Diagnosis not present

## 2019-02-02 DIAGNOSIS — E7800 Pure hypercholesterolemia, unspecified: Secondary | ICD-10-CM | POA: Diagnosis not present

## 2019-02-02 DIAGNOSIS — I10 Essential (primary) hypertension: Secondary | ICD-10-CM | POA: Diagnosis not present

## 2019-02-02 DIAGNOSIS — D126 Benign neoplasm of colon, unspecified: Secondary | ICD-10-CM | POA: Diagnosis not present

## 2019-02-02 DIAGNOSIS — G20 Parkinson's disease: Secondary | ICD-10-CM | POA: Diagnosis not present

## 2019-02-15 DIAGNOSIS — L851 Acquired keratosis [keratoderma] palmaris et plantaris: Secondary | ICD-10-CM | POA: Diagnosis not present

## 2019-02-15 DIAGNOSIS — E1142 Type 2 diabetes mellitus with diabetic polyneuropathy: Secondary | ICD-10-CM | POA: Diagnosis not present

## 2019-02-15 DIAGNOSIS — B351 Tinea unguium: Secondary | ICD-10-CM | POA: Diagnosis not present

## 2019-02-16 DIAGNOSIS — E119 Type 2 diabetes mellitus without complications: Secondary | ICD-10-CM | POA: Diagnosis not present

## 2019-02-17 DIAGNOSIS — E7800 Pure hypercholesterolemia, unspecified: Secondary | ICD-10-CM | POA: Diagnosis not present

## 2019-02-17 DIAGNOSIS — Z7982 Long term (current) use of aspirin: Secondary | ICD-10-CM | POA: Diagnosis not present

## 2019-02-17 DIAGNOSIS — I10 Essential (primary) hypertension: Secondary | ICD-10-CM | POA: Diagnosis not present

## 2019-02-17 DIAGNOSIS — M5136 Other intervertebral disc degeneration, lumbar region: Secondary | ICD-10-CM | POA: Diagnosis not present

## 2019-02-17 DIAGNOSIS — R809 Proteinuria, unspecified: Secondary | ICD-10-CM | POA: Diagnosis not present

## 2019-02-17 DIAGNOSIS — E118 Type 2 diabetes mellitus with unspecified complications: Secondary | ICD-10-CM | POA: Diagnosis not present

## 2019-02-24 DIAGNOSIS — I10 Essential (primary) hypertension: Secondary | ICD-10-CM | POA: Diagnosis not present

## 2019-02-24 DIAGNOSIS — D649 Anemia, unspecified: Secondary | ICD-10-CM | POA: Diagnosis not present

## 2019-02-24 DIAGNOSIS — E118 Type 2 diabetes mellitus with unspecified complications: Secondary | ICD-10-CM | POA: Diagnosis not present

## 2019-02-24 DIAGNOSIS — F3340 Major depressive disorder, recurrent, in remission, unspecified: Secondary | ICD-10-CM | POA: Diagnosis not present

## 2019-02-24 DIAGNOSIS — Z125 Encounter for screening for malignant neoplasm of prostate: Secondary | ICD-10-CM | POA: Diagnosis not present

## 2019-02-24 DIAGNOSIS — E1169 Type 2 diabetes mellitus with other specified complication: Secondary | ICD-10-CM | POA: Diagnosis not present

## 2019-02-24 DIAGNOSIS — B351 Tinea unguium: Secondary | ICD-10-CM | POA: Diagnosis not present

## 2019-02-24 DIAGNOSIS — M5136 Other intervertebral disc degeneration, lumbar region: Secondary | ICD-10-CM | POA: Diagnosis not present

## 2019-02-24 DIAGNOSIS — E7800 Pure hypercholesterolemia, unspecified: Secondary | ICD-10-CM | POA: Diagnosis not present

## 2019-02-24 DIAGNOSIS — G20 Parkinson's disease: Secondary | ICD-10-CM | POA: Diagnosis not present

## 2019-02-24 DIAGNOSIS — R1310 Dysphagia, unspecified: Secondary | ICD-10-CM | POA: Diagnosis not present

## 2019-02-25 ENCOUNTER — Other Ambulatory Visit: Payer: Self-pay | Admitting: Internal Medicine

## 2019-02-25 DIAGNOSIS — R1310 Dysphagia, unspecified: Secondary | ICD-10-CM

## 2019-03-03 DIAGNOSIS — D649 Anemia, unspecified: Secondary | ICD-10-CM | POA: Diagnosis not present

## 2019-03-04 DIAGNOSIS — F0280 Dementia in other diseases classified elsewhere without behavioral disturbance: Secondary | ICD-10-CM | POA: Diagnosis not present

## 2019-03-04 DIAGNOSIS — F3340 Major depressive disorder, recurrent, in remission, unspecified: Secondary | ICD-10-CM | POA: Diagnosis not present

## 2019-03-04 DIAGNOSIS — G20 Parkinson's disease: Secondary | ICD-10-CM | POA: Diagnosis not present

## 2019-04-05 ENCOUNTER — Other Ambulatory Visit: Payer: Self-pay

## 2019-04-05 ENCOUNTER — Ambulatory Visit
Admission: RE | Admit: 2019-04-05 | Discharge: 2019-04-05 | Disposition: A | Discharge status: Home / Self Care | Payer: PPO | Source: Ambulatory Visit | Attending: Internal Medicine | Admitting: Internal Medicine

## 2019-04-05 DIAGNOSIS — R1312 Dysphagia, oropharyngeal phase: Secondary | ICD-10-CM | POA: Diagnosis not present

## 2019-04-05 DIAGNOSIS — R05 Cough: Secondary | ICD-10-CM | POA: Diagnosis not present

## 2019-04-05 DIAGNOSIS — R1310 Dysphagia, unspecified: Secondary | ICD-10-CM | POA: Diagnosis not present

## 2019-04-05 NOTE — Therapy (Signed)
[place], [place], [address] Phone: [phone]   Fax:     Modified Barium Swallow  Patient Details  Name: Randall [name] MRN: [id number] Date of Birth: 21-Feb-1951 No data recorded  Encounter Date: 04/05/2019    Past Medical History:  Diagnosis Date  . Allergic   . Colon adenoma   . Depression   . Diabetes mellitus, type II ([place])   . Diverticulosis   . Hypertension   . Seizures ([place])   . Sleep apnea   . Urinary hesitancy     Past Surgical History:  Procedure Laterality Date  . COLON SURGERY    . HERNIA REPAIR    . UVULOPALATOPHARYNGOPLASTY, TONSILLECTOMY AND SEPTOPLASTY      There were no vitals filed for this visit.   Subjective: Patient behavior: (alertness, ability to follow instructions, etc.):  The patient is alert, able to follow directions, and demonstrates mild cognitive decline  Chief complaint: Increasing episodes of coughing with eating/drinking, patient has diagosis of Parkinson's disease   Objective:  Radiological Procedure: A videoflouroscopic evaluation of oral-preparatory, reflex initiation, and pharyngeal phases of the swallow was performed; as well as a screening of the upper esophageal phase.  I. POSTURE: Upright in MBS chair  II. VIEW: Lateral  III. COMPENSATORY STRATEGIES: Dry swallow aids pharyngeal clearance, throat clearing aids expelling penetrated material from the laryngeal vestibule, voluntary cough is not strong enough to clear aspirated material  IV. BOLUSES ADMINISTERED:   Thin Liquid: 4 small   Nectar-thick Liquid: 1 moderate   Honey-thick Liquid: DNT   Puree: 2 teaspoon presentations   Mechanical Soft: 1/4 graham cracker in applesauce  V. RESULTS OF EVALUATION: A. ORAL PREPARATORY PHASE: (The lips, tongue, and velum are observed for strength and coordination)       **Overall Severity Rating: Mild; impulsive  B. SWALLOW INITIATION/REFLEX:  (The reflex is normal if "triggered" by the time the bolus reached the base of the tongue)  **Overall Severity Rating: Moderate, triggers at the pyriform sinuses  C. PHARYNGEAL PHASE: (Pharyngeal function is normal if the bolus shows rapid, smooth, and continuous transit through the pharynx and there is no pharyngeal residue after the swallow)  **Overall Severity Rating: Severe; reduced tongue base retraction, reduced hyolaryngeal excursion, incomplete epiglottic inversion, and severe pharyngeal residue  D. LARYNGEAL PENETRATION: (Material entering into the laryngeal inlet/vestibule but not aspirated) Transient with with solid, trace of residue with liquids- throat clearing aids laryngeal vestibule clearance  E. ASPIRATION: Of pharyngeal residue post swallow, requested cough is not strong enough to clear material from the trachea   F. ESOPHAGEAL PHASE: (Screening of the upper esophagus) No observed abnormality within the viewable cervical esophagus   ASSESSMENT: This 68 year old man; with Parkinson's disease and episodes of coughing with eating/drinking; is presenting with moderate-severe oropharyngeal dysphagia characterized by impulsive swallowing, delayed pharyngeal swallow initiation, impaired pharyngeal pressure generation, and severe pyriform sinus residue.  Oral control of the bolus including oral hold, rotary mastication, and anterior to posterior transfer is within functional limits (impulsive, but no other concerns).   The patient demonstrated transient laryngeal penetration of thicker consistencies and laryngeal penetration/trace aspiration of residue with liquids.  The patient is at risk for chronic trace aspiration.  The patient appears to be tolerating aspiration, he denies negative pulmonary sequelae to aspiration- no pneumonia/bronchitis.  Given the progressive nature of Parkinson's, the patient is at risk for worsening dysphagia.  Recommend that the patient come for outpatient therapy  for swallowing exercises to strengthen to prolong safer swallowing.  PLAN/RECOMMENDATIONS:   A. Diet: Continue current diet   B. Swallowing Precautions: Eat slowly, take small sips and bites, periodically clear throat    C. Recommended consultation to: N/A   D. Therapy recommendations: speech therapy to improve hyolaryngeal movement and reduce pharyngeal residue, such as Shaker Exercise, effortful pitch glide, and high effort/high intensity vocal exercises (such as loud vowel prolongation, loud speech).      E. Results and recommendations were discussed with the patient immediately following the study and the final report routed to the referring MD.    Patient will benefit from skilled therapeutic intervention in order to improve the following deficits and impairments:   1. Oropharyngeal dysphagia           Problem List Patient Active Problem List   Diagnosis Date Noted  . Adenoma of large intestine 02/01/2016  . Clinical depression 02/01/2016  . 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 Benign essential HTN 02/01/2016  . 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/2016  . Pure hypercholesterolemia 02/01/2016  . Type 2 diabetes mellitus ([place]) 01/10/2016  . Microalbuminuria 01/10/2016  . Degeneration of intervertebral disc of lumbar region 07/13/2015  . Parkinson's disease ([place]) 11/16/2012   [name], MS/CCC- SLP  [name] 04/05/2019, 2:21 PM  [facility], [place], [address] Phone: [phone]   Fax:     Name: Randall [name] MRN: [id number] Date of Birth: 09-29-1950

## 2019-07-07 DIAGNOSIS — E1142 Type 2 diabetes mellitus with diabetic polyneuropathy: Secondary | ICD-10-CM | POA: Diagnosis not present

## 2019-07-07 DIAGNOSIS — Z23 Encounter for immunization: Secondary | ICD-10-CM | POA: Diagnosis not present

## 2019-07-07 DIAGNOSIS — L851 Acquired keratosis [keratoderma] palmaris et plantaris: Secondary | ICD-10-CM | POA: Diagnosis not present

## 2019-07-07 DIAGNOSIS — B351 Tinea unguium: Secondary | ICD-10-CM | POA: Diagnosis not present

## 2019-07-27 DIAGNOSIS — R001 Bradycardia, unspecified: Secondary | ICD-10-CM | POA: Diagnosis not present

## 2019-07-27 DIAGNOSIS — G20 Parkinson's disease: Secondary | ICD-10-CM | POA: Diagnosis not present

## 2019-07-27 DIAGNOSIS — F0280 Dementia in other diseases classified elsewhere without behavioral disturbance: Secondary | ICD-10-CM | POA: Diagnosis not present

## 2019-07-27 DIAGNOSIS — F411 Generalized anxiety disorder: Secondary | ICD-10-CM | POA: Diagnosis not present

## 2019-11-08 DIAGNOSIS — L851 Acquired keratosis [keratoderma] palmaris et plantaris: Secondary | ICD-10-CM | POA: Diagnosis not present

## 2019-11-08 DIAGNOSIS — E1142 Type 2 diabetes mellitus with diabetic polyneuropathy: Secondary | ICD-10-CM | POA: Diagnosis not present

## 2019-11-08 DIAGNOSIS — B351 Tinea unguium: Secondary | ICD-10-CM | POA: Diagnosis not present

## 2019-11-19 DIAGNOSIS — E7800 Pure hypercholesterolemia, unspecified: Secondary | ICD-10-CM | POA: Diagnosis not present

## 2019-11-19 DIAGNOSIS — D649 Anemia, unspecified: Secondary | ICD-10-CM | POA: Diagnosis not present

## 2019-11-19 DIAGNOSIS — Z125 Encounter for screening for malignant neoplasm of prostate: Secondary | ICD-10-CM | POA: Diagnosis not present

## 2019-11-19 DIAGNOSIS — E118 Type 2 diabetes mellitus with unspecified complications: Secondary | ICD-10-CM | POA: Diagnosis not present

## 2019-11-24 DIAGNOSIS — F0280 Dementia in other diseases classified elsewhere without behavioral disturbance: Secondary | ICD-10-CM | POA: Diagnosis not present

## 2019-11-24 DIAGNOSIS — G20 Parkinson's disease: Secondary | ICD-10-CM | POA: Diagnosis not present

## 2019-11-25 ENCOUNTER — Telehealth: Payer: Self-pay

## 2019-11-25 NOTE — Telephone Encounter (Signed)
Attempted to call wife to schedule palliative care visit for patient.  Voice mailbox wasn't set up so RN could not leave message.  Paliative care team will continue to call to schedule visit.

## 2019-11-26 DIAGNOSIS — G20 Parkinson's disease: Secondary | ICD-10-CM | POA: Diagnosis not present

## 2019-11-26 DIAGNOSIS — F0280 Dementia in other diseases classified elsewhere without behavioral disturbance: Secondary | ICD-10-CM | POA: Diagnosis not present

## 2019-11-26 DIAGNOSIS — E7800 Pure hypercholesterolemia, unspecified: Secondary | ICD-10-CM | POA: Diagnosis not present

## 2019-11-26 DIAGNOSIS — F3340 Major depressive disorder, recurrent, in remission, unspecified: Secondary | ICD-10-CM | POA: Diagnosis not present

## 2019-11-26 DIAGNOSIS — R1312 Dysphagia, oropharyngeal phase: Secondary | ICD-10-CM | POA: Diagnosis not present

## 2019-11-26 DIAGNOSIS — E118 Type 2 diabetes mellitus with unspecified complications: Secondary | ICD-10-CM | POA: Diagnosis not present

## 2019-11-26 DIAGNOSIS — Z1211 Encounter for screening for malignant neoplasm of colon: Secondary | ICD-10-CM | POA: Diagnosis not present

## 2019-11-26 DIAGNOSIS — I10 Essential (primary) hypertension: Secondary | ICD-10-CM | POA: Diagnosis not present

## 2019-11-26 DIAGNOSIS — Z0000 Encounter for general adult medical examination without abnormal findings: Secondary | ICD-10-CM | POA: Diagnosis not present

## 2019-11-26 DIAGNOSIS — L989 Disorder of the skin and subcutaneous tissue, unspecified: Secondary | ICD-10-CM | POA: Diagnosis not present

## 2019-11-26 DIAGNOSIS — M5136 Other intervertebral disc degeneration, lumbar region: Secondary | ICD-10-CM | POA: Diagnosis not present

## 2019-11-29 ENCOUNTER — Other Ambulatory Visit: Payer: Self-pay | Admitting: Internal Medicine

## 2019-11-29 ENCOUNTER — Telehealth: Payer: Self-pay

## 2019-11-29 DIAGNOSIS — R1312 Dysphagia, oropharyngeal phase: Secondary | ICD-10-CM

## 2019-11-29 DIAGNOSIS — G20 Parkinson's disease: Secondary | ICD-10-CM

## 2019-11-29 NOTE — Telephone Encounter (Signed)
Telephone call to patient to schedule palliative care visit with patient. Patient/family in agreement with home visit on 12-08-19 at 2:00PM.

## 2019-12-08 ENCOUNTER — Other Ambulatory Visit: Payer: Self-pay

## 2019-12-08 ENCOUNTER — Other Ambulatory Visit: Payer: PPO

## 2019-12-08 DIAGNOSIS — Z515 Encounter for palliative care: Secondary | ICD-10-CM

## 2019-12-08 NOTE — Progress Notes (Signed)
PATIENT NAME: Randall [name] DOB: 29-Jul-1951 MRN: [id number]  PRIMARY CARE PROVIDER: Adin Hector, MD  RESPONSIBLE PARTY:  Acct ID - Guarantor Home Phone Work Phone Relationship Acct Type  [id number] - Randall [name] [phone]  Self P/F     Randall [name] [id number]    PLAN OF CARE and INTERVENTIONS:               1.  GOALS OF CARE/ ADVANCE CARE PLANNING:  Remain in home with wife and function at optimal level.               2.  PATIENT/CAREGIVER EDUCATION:  Education on palliative care services, education on fall precautions, review meds, support               3.  DISEASE STATUS: SW and RN made scheduled palliative care home visit. Palliative care team met with patient and his wife Randall [name].  Patient's past medical history includes but not limited to Parkinson's disease, hypertension, adenoma of large intestine, type 2 diabetes, degeneration intervertebral disc of lumbar region, depression, hypercholesterolemia, hypersomnia with sleep apnea. Patient alert and oriented. Patient reports he suffered a fall two weeks ago. Wife reports patient went to push chair in it table and fell.  Patient having tremors in his hands and reports tremors in his face. Patient and wife report today is a good day and patient did not show  tremors until the end of the visit. Dr. [name] neurologist to start new medication as needed Kynmobi that patient can take every 2 hours in addition to other Parkinsons meds.  Medication is for increased tremors and patient cannot exceed 5 tabs per day. Patient able to ambulate independently. Patient is able to do own self care at the present time. Patient has no incontinence of bowel or bladder. Patient is 5'8 and weighs 172 pounds. Patient appetite is good however wife and patient report patient is having difficulty with swallowing. Patient to have study done next week to determine if patient is getting food stuck and having increased coughing. Patient states he does not feel safe  getting in and out of the bathtub and palliative care team discussed tub transfer bench with patient and wife. Nurse also provideed education to wife on how to use blood pressure monitor that wife obtained for the home. Patient's vital signs are stable. Patient has right ankle swelling at times from old sports Injury. Patient denies having any cough or shortness of breath.  Patient has no open areas of skin breakdown. Patient has sleep apnea but does not wear CPAP at night. Wife reports if patient begins snoring she will have patient turn on his side and patient appears to rest better. Patient has a living will and palliative care team to discuss MOST form at next visit. Nurse reviewed patient's medications with wife. Patient and wife in agreement with palliative care services. Patient and wife encouraged to contact palliative care questions or concerns.   HISTORY OF PRESENT ILLNESS:  Patient is a 69 year old male whi resides in home with his wife Randall [name].  Patient open to palliative care services.  Patient will be seen monthly and PRN.   CODE STATUS: No Code   ADVANCED DIRECTIVES: Y Living will  MOST FORM: No PPS: 60%   PHYSICAL EXAM:   VITALS: Today's Vitals   12/08/19 1421  BP: 116/75  Pulse: 61  Resp: 18  Temp: 98.1 F (36.7 C)  TempSrc: Temporal  SpO2:  99%  Weight: 172 lb (78 kg)  Height: '5\' 8"'$  (1.727 m)  PainSc: 0-No pain    LUNGS: clear to auscultation  CARDIAC: Cor RRR  EXTREMITIES: trace edema in right ankle  SKIN: no visible open areas of skin breakdown.   NEURO: Alert and oriented x 3       Randall Simmer, RN

## 2019-12-08 NOTE — Progress Notes (Signed)
COMMUNITY PALLIATIVE CARE SW NOTE  PATIENT NAME: Randall [name] DOB: 1951/03/11 MRN: [id number]  PRIMARY CARE PROVIDER: Adin Hector, MD  RESPONSIBLE PARTY:  Acct ID - Guarantor Home Phone Work Phone Relationship Acct Type  [id number] - Randall [name] [phone]  Self P/F     [place], Randall [name], [place] [address]     PLAN OF CARE and INTERVENTIONS:             1. GOALS OF CARE/ ADVANCE CARE PLANNING:  Patient is a FULL CODE. Discussed advance care directives. Patient has Living Will. Patient is interested in MOST form, SW to complete at next visit. Goal is remain at home and be as independent as he can be.  2. SOCIAL/EMOTIONAL/SPIRITUAL ASSESSMENT/ INTERVENTIONS:  SW and RN met with patient and Randall [name] (patient's wife) in the home. Patient and Randall [name] provided brief history. Patient was diagnosed with Parkinson's disease in 2014. Patient denies pain today, patient does have occasional back pain. Patient reports his appetite is good but does have difficulty with swallowing but does impact his ability to eat at times. Patient does have tremors, worse with stress. Patient is waiting for a new fast acting medication to try for his Parkinson's, called Kynmobi. Randall [name] said patient has sleep apnea, but is not using his CPAP. Patient reports that he is sleeping "okay". Patient and Randall [name] are looking into a new mattress. Discussed safety. Discussed a shower bench and Randall [name] is going to go to local supply store to help with showers. Patient is retired, worked at [employer]. Patient is married, lives with his wife and she cares for. Patient has two stepchildren. Patient has three step-grandchildren. SW provided education on palliative care, discussed care and goals, provided emotional support, and used active and reflective listening.  3. PATIENT/CAREGIVER EDUCATION/ COPING:  Patient is alert, oriented. Patient was pleasant with team. Patient openly expresses feelings with team. Patient copes with his faith.  Family is supportive. 4. PERSONAL EMERGENCY PLAN:  Family will call 9-1-1 for emergencies. 5. COMMUNITY RESOURCES COORDINATION/ HEALTH CARE NAVIGATION:  Randall [name] helps manage care. Patient is scheduled for a swallow study at [facility] on 4/7.  6. FINANCIAL/LEGAL CONCERNS/INTERVENTIONS:  None.     SOCIAL HX:  Social History   Tobacco Use  . Smoking status: Never Smoker  . Smokeless tobacco: Never Used  Substance Use Topics  . Alcohol use: Yes    Alcohol/week: 1.0 standard drinks    Types: 1 Cans of beer per week    Comment: socially     CODE STATUS: FULL CODE ADVANCED DIRECTIVES: N MOST FORM COMPLETE:  Patient interested, to continue to discuss. HOSPICE EDUCATION PROVIDED: None.  PPS: Patient is mostly independent of ADLs. Patient needs standby for safety.   I spent 60 minutes with patient/family, from 2:00-3:00p providing education, support and consultation.   Randall Lombard, LCSW

## 2019-12-15 ENCOUNTER — Other Ambulatory Visit: Payer: Self-pay

## 2019-12-15 ENCOUNTER — Ambulatory Visit
Admission: RE | Admit: 2019-12-15 | Discharge: 2019-12-15 | Disposition: A | Discharge status: Home / Self Care | Payer: PPO | Source: Ambulatory Visit | Attending: Internal Medicine | Admitting: Internal Medicine

## 2019-12-15 DIAGNOSIS — R1310 Dysphagia, unspecified: Secondary | ICD-10-CM | POA: Diagnosis not present

## 2019-12-15 DIAGNOSIS — T17900A Unspecified foreign body in respiratory tract, part unspecified causing asphyxiation, initial encounter: Secondary | ICD-10-CM | POA: Diagnosis not present

## 2019-12-15 DIAGNOSIS — G20 Parkinson's disease: Secondary | ICD-10-CM | POA: Insufficient documentation

## 2019-12-15 DIAGNOSIS — R1312 Dysphagia, oropharyngeal phase: Secondary | ICD-10-CM | POA: Insufficient documentation

## 2019-12-15 NOTE — Therapy (Signed)
Randall [name], [place], [address] Phone: [phone]   Fax:     Modified Barium Swallow  Patient Details  Name: Randall [name] MRN: [id number] Date of Birth: 12-04-1950 No data recorded  Encounter Date: 12/15/2019  End of Session - 12/15/19 1332    Visit Number  1    Number of Visits  1    Date for SLP Re-Evaluation  12/15/19    SLP Start Time  15    SLP Stop Time   1332    SLP Time Calculation (min)  52 min    Activity Tolerance  Patient tolerated treatment well       Past Medical History:  Diagnosis Date  . Allergic   . Colon adenoma   . Depression   . Diabetes mellitus, type II ([place])   . Diverticulosis   . Hypertension   . Seizures ([place])   . Sleep apnea   . Urinary hesitancy     Past Surgical History:  Procedure Laterality Date  . COLON SURGERY    . HERNIA REPAIR    . UVULOPALATOPHARYNGOPLASTY, TONSILLECTOMY AND SEPTOPLASTY      There were no vitals filed for this visit.   Subjective: Patient behavior: (alertness, ability to follow instructions, etc.): the patient is alert, able to verbalize hsi swallowing complaints, and follow directions.  He is soft spoken.  Chief complaint: Patient report vigorous coughing with meals but no chronic pneumonia/bronchitis    Objective:  Radiological Procedure: A videoflouroscopic evaluation of oral-preparatory, reflex initiation, and pharyngeal phases of the swallow was performed; as well as a screening of the upper esophageal phase.  I. POSTURE: Upright in MBS chair  II. VIEW: Lateral  III. COMPENSATORY STRATEGIES: N/A  IV. BOLUSES ADMINISTERED:   Thin Liquid: 2 self- administered cup rim   Nectar-thick Liquid: 2 self-administered cup rim   Honey-thick Liquid: DNT   Puree: 2 teaspoon presentations   Mechanical Soft:  1/4 graham cracker in applesauce  V. RESULTS OF EVALUATION: A. ORAL PREPARATORY PHASE: (The lips, tongue, and velum  are observed for strength and coordination)       **Overall Severity Rating: Minimal; mildly slowed/disorganzed oral management  B. SWALLOW INITIATION/REFLEX: (The reflex is normal if "triggered" by the time the bolus reached the base of the tongue)  **Overall Severity Rating: Moderate; triggers at the pyriform sinuses  C. PHARYNGEAL PHASE: (Pharyngeal function is normal if the bolus shows rapid, smooth, and continuous transit through the pharynx and there is no pharyngeal residue after the swallow)  **Overall Severity Rating: Moderate; reduced tongue base retraction, reduced hyolaryngeal excursion, incomplete epiglottic inversion, moderate-severe pharyngeal residue with solids and nectar-thick liquid, mild pharyngeal residue with thin liquids  D. LARYNGEAL PENETRATION: (Material entering into the laryngeal inlet/vestibule but not aspirated) Inconsistent transient laryngeal penetration across consistencies   E. ASPIRATION: X1 frank aspiration nectar-thick liquid with delayed cough  F. ESOPHAGEAL PHASE: (Screening of the upper esophagus) In the cervical esophagus there is a finger-like protrusion along the posterior wall during swallow (does not impede flow of boluses) consistent with prominent cricopharyngeus.    ASSESSMENT: This 69 year old man; with Parkinson's and frequent vigorous coughing with meals; is presenting with moderate oropharyngeal dysphagia characterized by mildly disorganized/slowed oral management, delayed pharyngeal swallow initiation, reduced hyolaryngeal excursion, incomplete epiglottic inversion, reduced tongue base retraction, moderate-severe pharyngeal residue with thick consistencies, mild pharyngeal residue with thin liquid, transient laryngeal penetration, and frank aspiration X1 with nectar-thick liquids.  The patient  was observed to work harder with thin liquid, generating more hyolaryngeal excursion with no observed laryngeal penetration / tracheal aspiration and less  pharyngeal residue.  In the cervical esophagus there is a finger-like protrusion along the posterior wall during swallow (does not impede flow of boluses) consistent with prominent cricopharyngeus.  The patient has not had pneumonia/bronchitis and appears to be tolerating aspiration.   Recommend that the patient participate in the [facility] program, which has been shown to improve neuromuscular control of the entire upper aerodigestive tract, improving oral tongue and tongue base function during the oral and pharyngeal phases of swallowing as well as improving vocal intensity.    PLAN/RECOMMENDATIONS:   A. Diet: Continue usual diet   B. Swallowing Precautions: Pause eating/drinking when you start coughing   C. Recommended consultation to: follow up with MDs as recommended   D. Therapy recommendations: Speech therapy for LSVTLOUD   E. Results and recommendations were discussed with the patient and his wife immediately following the study and the final report routed to the referring MD.    Patient will benefit from skilled therapeutic intervention in order to improve the following deficits and impairments:   Oropharyngeal dysphagia - Plan: DG SWALLOW FUNC OP MEDICARE SPEECH PATH, DG SWALLOW FUNC OP MEDICARE SPEECH PATH  Parkinson disease ([place]) - Plan: DG SWALLOW FUNC OP MEDICARE SPEECH PATH, DG SWALLOW FUNC OP MEDICARE SPEECH PATH        Problem List Patient Active Problem List   Diagnosis Date Noted  . Adenoma of large intestine 02/01/2016  . Clinical depression 02/01/2016  . 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 Benign essential HTN 02/01/2016  . 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/2016  . Pure hypercholesterolemia 02/01/2016  . Type 2 diabetes mellitus ([place]) 01/10/2016  . Microalbuminuria 01/10/2016  . Degeneration of intervertebral disc of lumbar region 07/13/2015  . Parkinson's disease ([place]) 11/16/2012   Randall Sea, MS/CCC- SLP  Randall [name] 12/15/2019, 1:33 PM  [facility] [place], [place], [address] Phone: [phone]   Fax:     Name: Randall [name] MRN: [id number] Date of Birth: 25-Oct-1950

## 2019-12-30 ENCOUNTER — Telehealth: Payer: Self-pay

## 2019-12-30 NOTE — Telephone Encounter (Signed)
Telephone call to patient to schedule palliative care visit with patient. Patient/family in agreement with home visit on 01-04-20 at 2:30PM.

## 2020-01-04 ENCOUNTER — Other Ambulatory Visit: Payer: Self-pay

## 2020-01-04 ENCOUNTER — Other Ambulatory Visit: Payer: PPO

## 2020-01-04 DIAGNOSIS — Z515 Encounter for palliative care: Secondary | ICD-10-CM

## 2020-01-04 NOTE — Progress Notes (Signed)
COMMUNITY PALLIATIVE CARE SW NOTE  PATIENT NAME: Randall [name] DOB: 12-Nov-1950 MRN: [id number]  PRIMARY CARE PROVIDER: Adin Hector, MD  RESPONSIBLE PARTY:  Acct ID - Guarantor Home Phone Work Phone Relationship Acct Type  [id number] - Linker,Randall [name] [phone]  Self P/F     Randall [name], Randall [name],  [id number]     PLAN OF CARE and INTERVENTIONS:             1. GOALS OF CARE/ ADVANCE CARE PLANNING:  Patient is a FULL CODE. MOST form was discussed and completed. SW will notify NP. Goal is remain at home and be as independent as he can be.  2. SOCIAL/EMOTIONAL/SPIRITUAL ASSESSMENT/ INTERVENTIONS:  SW met with patient and Randall [name] (patient's wife) in the home. Patient denies pain. Patient reports that he is sleeping "okay". Patient's appetite is fair, eating about two meals a day. Discussed trying Glucerna once a day. Patient continues to experience tremors that make it difficult for him to walk and do for himself. Patient is waiting to start a new fast-acting medication for tremors, called Kynmobi. Discussed safety. SW discussed care and goals, provided emotional support, and used active and reflective listening.  3. PATIENT/CAREGIVER EDUCATION/ COPING:  Patient is alert, engaged in conversation. Patient was pleasant. Patient openly expresses feelings with SW. Patient is positive about his situation and tries to maintain his outlook. Patient copes with his faith. Patient also enjoys singing karaoke. Family is supportive. 4. PERSONAL EMERGENCY PLAN:  Family will call 9-1-1 for emergencies. 5. COMMUNITY RESOURCES COORDINATION/ HEALTH CARE NAVIGATION:  Randall [name] helps manage care. Patient is going to the dentist next week. Patient did have his swallow study, and is being referred to speech therapy with [facility]. Patient is going to [facility] on 5/6 for a lesion on his scalp.  6. FINANCIAL/LEGAL CONCERNS/INTERVENTIONS:  None.     SOCIAL HX:  Social History   Tobacco Use  . Smoking  status: Never Smoker  . Smokeless tobacco: Never Used  Substance Use Topics  . Alcohol use: Yes    Alcohol/week: 1.0 standard drinks    Types: 1 Cans of beer per week    Comment: socially     CODE STATUS: FULL CODE ADVANCED DIRECTIVES: N MOST FORM COMPLETE:   HOSPICE EDUCATION PROVIDED: None.  PPS: Patient is mostly independent of ADLs. Patient needs standby for safety.   I spent51mnutes with patient/family, from2:00-3:00pproviding education, support and consultation.  WMargaretmary Lombard LCSW

## 2020-01-13 ENCOUNTER — Ambulatory Visit: Payer: PPO | Admitting: Dermatology

## 2020-02-02 ENCOUNTER — Other Ambulatory Visit: Payer: PPO

## 2020-02-02 ENCOUNTER — Other Ambulatory Visit: Payer: Self-pay

## 2020-02-02 DIAGNOSIS — Z515 Encounter for palliative care: Secondary | ICD-10-CM

## 2020-02-02 NOTE — Progress Notes (Signed)
PATIENT NAME: Randall [name] DOB: Apr 13, 1951 MRN: [id number]  PRIMARY CARE PROVIDER: Adin Hector, MD  RESPONSIBLE PARTY:  Acct ID - Guarantor Home Phone Work Phone Relationship Acct Type  [id number] - [name],[name] [phone]  Self P/F     [place], [place] [address]    PLAN OF CARE and INTERVENTIONS:               1.  GOALS OF CARE/ ADVANCE CARE PLANNING:  Patient wants to remain home with wife and remain as independent as possible.               2.  PATIENT/CAREGIVER EDUCATION:  Education on fall precautions, education on need to take meds as directed to manage symptoms, reviewed meds, support               3.  DISEASE STATUS: RN made scheduled palliative care home visit. Nurse met with patient and patients wife Randall [name]. Patient pleasant and talkative during visit. Patient denies having pain at the present time. Patient reports he is coughing more after eating. Patient is scheduled to have speech therapy referral in the next week or so per wife. Nurse does not feel like patient is aspirating however it appears after patient eats he may be having increased saliva causing him to cough. Patient suffered a fall a week and a half ago. Patient states he was able to get himself off floor. Patient did not suffer any injuries. Patients vital signs are stable. Patient to see dermatologist Dr. Nehemiah Massed in 1.5 weeks. Dr [name] recommended patient see dermatologist as he feels Parkinson's could be linked for increased risk for melanoma. Patient does have a dry scaly patch on top of his head that likely is sun  damage. Nurse provide education that dermatologist may treat area with liquid nitrogen. Nurse reviewed patient's medications with patient and wife. Patients Mirtazapine, Lexapro and Seroquel are not listed in medication list in Epic. Patient also states he is only taking 1 metformin tablet per day rather than 2.  Patients breath sounds are clear. Patient's heart rate regular. Patient's respirations  even and unlabored. Patient has trace edema in his lower extremities right ankle is slightly more swollen than the left.  Patient reports his blood sugar yesterday was 93. Patient reports he has been sleeping well at night. Patient continues to have tremors and worse if patient puts off taking his meds.  Patients still considering trying new medication for tremors (Kynmobi) in addition to being on his regular Parkinson's medications. Patient continues to have shuffling gait. Patient nd wife remain in agreement with palliative care services. Patient and wife encouraged to contact palliative care with questions or concerns.      HISTORY OF PRESENT ILLNESS: Patient is a 69 year old patient who resides in home with wife Randall [name].  Patient is followed by palliative care and is seen monthly and PRN.      CODE STATUS: Full Code  ADVANCED DIRECTIVES: Y Living will MOST FORM: Yes PPS: 50%   PHYSICAL EXAM:   VITALS: Today's Vitals   02/02/20 1540  BP: 122/82  Pulse: 70  Resp: 18  Temp: 98.4 F (36.9 C)  TempSrc: Temporal  SpO2: 98%  Weight: 179 lb (81.2 kg)  PainSc: 0-No pain    LUNGS: clear to auscultation  CARDIAC: Cor RRR  EXTREMITIES: Trace edema SKIN: no visible open areas of skin breakdown.  NEURO: positive for gait problems  Randall Scaffidi, RN 

## 2020-02-09 DIAGNOSIS — E1142 Type 2 diabetes mellitus with diabetic polyneuropathy: Secondary | ICD-10-CM | POA: Diagnosis not present

## 2020-02-09 DIAGNOSIS — B351 Tinea unguium: Secondary | ICD-10-CM | POA: Diagnosis not present

## 2020-02-09 DIAGNOSIS — L851 Acquired keratosis [keratoderma] palmaris et plantaris: Secondary | ICD-10-CM | POA: Diagnosis not present

## 2020-02-21 ENCOUNTER — Ambulatory Visit: Payer: PPO | Attending: Neurology | Admitting: Speech Pathology

## 2020-02-21 ENCOUNTER — Other Ambulatory Visit: Payer: Self-pay

## 2020-02-21 DIAGNOSIS — R490 Dysphonia: Secondary | ICD-10-CM | POA: Diagnosis not present

## 2020-02-21 DIAGNOSIS — R1312 Dysphagia, oropharyngeal phase: Secondary | ICD-10-CM | POA: Insufficient documentation

## 2020-02-22 ENCOUNTER — Encounter: Payer: Self-pay | Admitting: Speech Pathology

## 2020-02-22 ENCOUNTER — Other Ambulatory Visit: Payer: Self-pay

## 2020-02-22 NOTE — Therapy (Signed)
[place] MAIN [facility] SERVICES [address] [place], [place], [address] Phone: [phone]   Fax:  [phone]  Speech Language Pathology Evaluation  Patient Details  Name: Randall [name] MRN: [id number] Date of Birth: 18-Aug-1951 Referring Provider (SLP): Dr. [name]   Encounter Date: 02/21/2020   End of Session - 02/22/20 1337    Visit Number 1    Number of Visits 17    Date for SLP Re-Evaluation 03/30/20    Authorization Type Medicare    Authorization Time Period Start 02/21/2020    Authorization - Visit Number 1    Progress Note Due on Visit 10    SLP Start Time 1400    SLP Stop Time  1445    SLP Time Calculation (min) 45 min    Activity Tolerance Patient tolerated treatment well           Past Medical History:  Diagnosis Date  . Allergic   . Colon adenoma   . Depression   . Diabetes mellitus, type II ([place])   . Diverticulosis   . Hypertension   . Seizures ([place])   . Sleep apnea   . Urinary hesitancy     Past Surgical History:  Procedure Laterality Date  . COLON SURGERY    . HERNIA REPAIR    . UVULOPALATOPHARYNGOPLASTY, TONSILLECTOMY AND SEPTOPLASTY      There were no vitals filed for this visit.       SLP Evaluation [facility] - 02/22/20 0001      SLP Visit Information   SLP Received On 02/21/20    Referring Provider (SLP) Dr. [name]    Onset Date 11/25/2019    Medical Diagnosis Parkinson's disease      Subjective   Subjective "A lot of people don't hear what I say"    Patient/Family Stated Goal Easy to understand speech      Pain Assessment   Pain Score 0-No pain      General Information   HPI 69 year old man diagnosed with Parkinson's disease.  Patient had a course of LSVT-LOUD in 2019, an MBSS 04/05/2019, and an MBS 12/15/2019.      Prior Functional Status   Cognitive/Linguistic Baseline Within functional limits      Oral Motor/Sensory Function   Overall Oral Motor/Sensory Function Appears within functional limits  for tasks assessed      Motor Speech   Overall Motor Speech Impaired    Respiration Impaired    Level of Impairment Conversation    Phonation Low vocal intensity;Hoarse;Wet    Resonance Within functional limits    Articulation Within functional limitis    Intelligibility Intelligibility reduced    Sentence 75-100% accurate    Conversation 50-74% accurate    Phonation Impaired    Volume Soft      Standardized Assessments   Standardized Assessments  Other Assessment   LSVT-LOUD Voice Evaluation         LSVT-LOUD Voice Evaluation Maximum phonation time for sustained "ah": 8 seconds Mean intensity during sustained "ah": 80 dB  Mean intensity sustained during conversational speech: 65 dB Highest dynamic pitch when altering pitch from a low note to a high note: 460 Hz Lowest dynamic pitch when altering from a high note to a low note: 120 Hz Mean intensity during reading a standardized passage: 78 dB Mean intensity during while completing motor simultaneously with conversation: 62 dB Stimulability: Improved vocal quality with loud voice (85 dB) for sustain vowel, maximum high and low phonation, and  functional phrases.  Improved pitch range with model.  Patient reports "I feel like I'm yelling, like I'm angry".  MBS 12/15/2019 "This 69 year old man; with Parkinson's and frequent vigorous coughing with meals; is presenting with moderate oropharyngeal dysphagia characterized by mildly disorganized/slowed oral management, delayed pharyngeal swallow initiation, reduced hyolaryngeal excursion, incomplete epiglottic inversion, reduced tongue base retraction, moderate-severe pharyngeal residue with thick consistencies, mild pharyngeal residue with thin liquid, transient laryngeal penetration, and frank aspiration X1 with nectar-thick liquids.  The patient was observed to work harder with thin liquid, generating more hyolaryngeal excursion with no observed laryngeal penetration / tracheal aspiration and  less pharyngeal residue.  In the cervical esophagus there is a finger-like protrusion along the posterior wall during swallow (does not impede flow of boluses) consistent with prominent cricopharyngeus.  The patient has not had pneumonia/bronchitis and appears to be tolerating aspiration.   Recommend that the patient participate in the [facility] program, which has been shown to improve neuromuscular control of the entire upper aerodigestive tract, improving oral tongue and tongue base function during the oral and pharyngeal phases of swallowing as well as improving vocal intensity."       SLP Education - 02/22/20 1336    Education Details LSVT-LOUD program    Person(s) Educated Patient    Methods Explanation    Comprehension Verbalized understanding              SLP Long Term Goals - 02/22/20 1341      SLP LONG TERM GOAL #1   Title The patient will complete Daily Tasks (Maximum duration "ah", High/Lows, and Functional Phrases) at average loudness of 80 dB and with loud, good quality voice.     Time 4    Period Weeks    Status New    Target Date 03/30/20      SLP LONG TERM GOAL #2   Title The patient will complete Hierarchal Speech Loudness reading drills (words/phrases, sentences, and paragraph) at average 75 dB and with loud, good quality voice.      Time 4    Period Weeks    Status New    Target Date 03/30/20      SLP LONG TERM GOAL #3   Title The patient will participate in conversation, maintaining average loudness of 75 dB and loud, good quality voice.    Time 4    Period Weeks    Status New    Target Date 03/30/20      SLP LONG TERM GOAL #4   Title The patient will complete homework daily.    Time 4    Period Weeks    Status New    Target Date 03/30/20            Plan - 02/22/20 1339    Clinical Impression Statement This 69 year old man diagnosed with Parkinson's disease is presenting with mild-moderate voice disorder characterized by hoarse vocal quality,  hypophonia, and monotone voice.  Based on stimulability testing, the patient is judged to be a good candidate for the LSVT LOUD program.  It is recommended that the patient receive the LSVT LOUD program which is comprised of 16 intensive sessions (4 times per week for 4 weeks, one hour sessions).  Prognosis for improvement is good based on motivation, stimulability, and strong family support.  LSVT LOUD has been documented in the literature as efficacious for individuals with Parkinson's disease.  In addition, the patient has oropharyngeal dysphagia and LSVT-LOUD has been shown to improve neuromuscular control of the  entire upper aerodigestive tract, improving oral tongue and tongue base function during the oral and pharyngeal phases of swallowing as well as improving vocal intensity."    Speech Therapy Frequency 4x / week    Duration 4 weeks    Treatment/Interventions Pharyngeal strengthening exercises;SLP instruction and feedback;Patient/family education;Other (comment)   LSVT-LOUD          Patient will benefit from skilled therapeutic intervention in order to improve the following deficits and impairments:   Dysphonia - Plan: SLP plan of care cert/re-cert  Dysphagia, oropharyngeal phase - Plan: SLP plan of care cert/re-cert    Problem List Patient Active Problem List   Diagnosis Date Noted  . Adenoma of large intestine 02/01/2016  . Clinical depression 02/01/2016  . 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 Benign essential HTN 02/01/2016  . 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/2016  . Pure hypercholesterolemia 02/01/2016  . Type 2 diabetes mellitus ([place]) 01/10/2016  . Microalbuminuria 01/10/2016  . Degeneration of intervertebral disc of lumbar region 07/13/2015  . Parkinson's disease ([place]) 11/16/2012   [name], MS/CCC- SLP  [name] 02/22/2020, 1:45 PM  [place] MAIN [facility] SERVICES [address]  [place], [place], [address] Phone: [phone]   Fax:  [phone]  Name: Randall [name] MRN: [id number] Date of Birth: Mar 03, 1951

## 2020-02-24 ENCOUNTER — Ambulatory Visit: Payer: PPO | Admitting: Dermatology

## 2020-02-24 ENCOUNTER — Other Ambulatory Visit: Payer: Self-pay

## 2020-02-24 DIAGNOSIS — L570 Actinic keratosis: Secondary | ICD-10-CM | POA: Diagnosis not present

## 2020-02-24 DIAGNOSIS — L578 Other skin changes due to chronic exposure to nonionizing radiation: Secondary | ICD-10-CM | POA: Diagnosis not present

## 2020-02-24 DIAGNOSIS — L821 Other seborrheic keratosis: Secondary | ICD-10-CM

## 2020-02-24 NOTE — Patient Instructions (Signed)
Cryotherapy Aftercare  . Wash gently with soap and water everyday.   . Apply Vaseline and Band-Aid daily until healed.  

## 2020-02-24 NOTE — Progress Notes (Signed)
   New Patient Visit  Subjective  Randall [name] is a 69 y.o. male who presents for the following: check growth (scalp ~2-30m, itchy, no hx of skin ca, no fhx of skin ca).  The following portions of the chart were reviewed this encounter and updated as appropriate:  Tobacco  Allergies  Meds  Problems  Med Hx  Surg Hx  Fam Hx      Review of Systems:  No other skin or systemic complaints except as noted in HPI or Assessment and Plan.  Objective  Well appearing patient in no apparent distress; mood and affect are within normal limits.  A focused examination was performed including face, scalp. Relevant physical exam findings are noted in the Assessment and Plan.  Objective  R anterior scalp x 1, scalp x 3, L temple x 1 (5): Pink scaly macule 1.5 x 2.0cm R ant scalp, other pink scaly macules face/scalp    Assessment & Plan    Seborrheic Keratoses - Stuck-on, waxy, tan-brown papules and plaques  - Discussed benign etiology and prognosis. - Observe - Call for any changes   Actinic Damage - diffuse scaly erythematous macules with underlying dyspigmentation - Recommend daily broad spectrum sunscreen SPF 30+ to sun-exposed areas, reapply every 2 hours as needed.  - Call for new or changing lesions.   AK (actinic keratosis) (5) R anterior scalp x 1, scalp x 3, L temple x 1  Destruction of lesion - R anterior scalp x 1, scalp x 3, L temple x 1 Complexity: simple   Destruction method: cryotherapy   Informed consent: discussed and consent obtained   Timeout:  patient name, date of birth, surgical site, and procedure verified Lesion destroyed using liquid nitrogen: Yes   Region frozen until ice ball extended beyond lesion: Yes   Outcome: patient tolerated procedure well with no complications   Post-procedure details: wound care instructions given    Return in about 2 months (around 04/25/2020) for recheck AKs.  I, Othelia Pulling, RMA, am acting as scribe for Sarina Ser, MD  .  Documentation: I have reviewed the above documentation for accuracy and completeness, and I agree with the above.  Sarina Ser, MD

## 2020-02-27 ENCOUNTER — Encounter: Payer: Self-pay | Admitting: Dermatology

## 2020-02-28 ENCOUNTER — Ambulatory Visit: Payer: PPO | Admitting: Speech Pathology

## 2020-02-28 ENCOUNTER — Other Ambulatory Visit: Payer: Self-pay

## 2020-02-28 ENCOUNTER — Encounter: Payer: Self-pay | Admitting: Speech Pathology

## 2020-02-28 DIAGNOSIS — R490 Dysphonia: Secondary | ICD-10-CM | POA: Diagnosis not present

## 2020-02-28 DIAGNOSIS — R1312 Dysphagia, oropharyngeal phase: Secondary | ICD-10-CM

## 2020-02-28 NOTE — Therapy (Signed)
[place] MAIN [facility] SERVICES [address] [place], [place], [address] Phone: [phone]   Fax:  [phone]  Speech Language Pathology Treatment  Patient Details  Name: Randall [name] MRN: [id number] Date of Birth: 09-03-1951 Referring Provider (SLP): Dr. [name]   Encounter Date: 02/28/2020   End of Session - 02/28/20 1612    Visit Number 2    Number of Visits 17    Date for SLP Re-Evaluation 03/30/20    Authorization Type Medicare    Authorization Time Period Start 02/21/2020    Authorization - Visit Number 2    Progress Note Due on Visit 10    SLP Start Time 1400    SLP Stop Time  1500    SLP Time Calculation (min) 60 min    Activity Tolerance Patient tolerated treatment well           Past Medical History:  Diagnosis Date  . Allergic   . Colon adenoma   . Depression   . Diabetes mellitus, type II ([place])   . Diverticulosis   . Hypertension   . Seizures ([place])   . Sleep apnea   . Urinary hesitancy     Past Surgical History:  Procedure Laterality Date  . COLON SURGERY    . HERNIA REPAIR    . UVULOPALATOPHARYNGOPLASTY, TONSILLECTOMY AND SEPTOPLASTY      There were no vitals filed for this visit.   Subjective Assessment - 02/28/20 1610    Subjective Patient is eager to improve his speech                 ADULT SLP TREATMENT - 02/28/20 0001      General Information   Behavior/Cognition Alert;Cooperative;Pleasant mood    HPI 69 year old man diagnosed with Parkinson's disease.  Patient had a course of LSVT-LOUD in 2019, an MBSS 04/05/2019, and an MBS 12/15/2019.       Treatment Provided   Treatment provided Cognitive-Linquistic      Pain Assessment   Pain Assessment No/denies pain      Cognitive-Linquistic Treatment   Treatment focused on Voice    Skilled Treatment Daily Task #1 (Maximum sustained "ah"): Average 13 seconds, 85 dB. Daily Task 2 (Maximum fundamental frequency range): Highs: 15 high pitched "ah" (250  Hz). Lows: 15 low pitched "ah" (120 Hz). Daily task #3 (Maximum speech loudness drill of functional phrases): Average 75 dB.  Hierarchal speech loudness drill: Imitate phrases, 75 dB.  Read phrases, 75 dB.  Homework: given.  Off the cuff remarks: average 69 dB.        Assessment / Recommendations / Plan   Plan Continue with current plan of care      Progression Toward Goals   Progression toward goals Progressing toward goals            SLP Education - 02/28/20 1612    Education Details LSVT-LOUD, need to slow speech so each word can be loud    Person(s) Educated Patient    Methods Explanation    Comprehension Verbalized understanding              SLP Long Term Goals - 02/22/20 1341      SLP LONG TERM GOAL #1   Title The patient will complete Daily Tasks (Maximum duration "ah", High/Lows, and Functional Phrases) at average loudness of 80 dB and with loud, good quality voice.     Time 4    Period Weeks    Status New  Target Date 03/30/20      SLP LONG TERM GOAL #2   Title The patient will complete Hierarchal Speech Loudness reading drills (words/phrases, sentences, and paragraph) at average 75 dB and with loud, good quality voice.      Time 4    Period Weeks    Status New    Target Date 03/30/20      SLP LONG TERM GOAL #3   Title The patient will participate in conversation, maintaining average loudness of 75 dB and loud, good quality voice.    Time 4    Period Weeks    Status New    Target Date 03/30/20      SLP LONG TERM GOAL #4   Title The patient will complete homework daily.    Time 4    Period Weeks    Status New    Target Date 03/30/20            Plan - 02/28/20 1613    Clinical Impression Statement The patient is completing daily tasks and hierarchal speech drill tasks with loud, good quality voice given moderate SLP cues to slow speech sufficiently to be loud for all words.    Speech Therapy Frequency 4x / week    Duration 4 weeks     Treatment/Interventions Pharyngeal strengthening exercises;SLP instruction and feedback;Patient/family education;Other (comment)    Potential to Achieve Goals Good    Potential Considerations Ability to learn/carryover information;Previous level of function;Co-morbidities;Severity of impairments;Cooperation/participation level;Medical prognosis;Family/community support    SLP Home Exercise Plan LSVT-LOUD daily homework    Consulted and Agree with Plan of Care Patient           Patient will benefit from skilled therapeutic intervention in order to improve the following deficits and impairments:   Dysphonia  Dysphagia, oropharyngeal phase    Problem List Patient Active Problem List   Diagnosis Date Noted  . Adenoma of large intestine 02/01/2016  . Clinical depression 02/01/2016  . 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 Benign essential HTN 02/01/2016  . 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/2016  . Pure hypercholesterolemia 02/01/2016  . Type 2 diabetes mellitus ([place]) 01/10/2016  . Microalbuminuria 01/10/2016  . Degeneration of intervertebral disc of lumbar region 07/13/2015  . Parkinson's disease ([place]) 11/16/2012   Randall Sea, MS/CCC- SLP  [name] 02/28/2020, 4:14 PM  [place] MAIN [facility] SERVICES [address] [place], [place], [address] Phone: [phone]   Fax:  [phone]   Name: Randall [name] MRN: [id number] Date of Birth: [birth year]

## 2020-02-29 ENCOUNTER — Ambulatory Visit: Payer: PPO | Admitting: Speech Pathology

## 2020-02-29 ENCOUNTER — Encounter: Payer: Self-pay | Admitting: Speech Pathology

## 2020-02-29 DIAGNOSIS — R1312 Dysphagia, oropharyngeal phase: Secondary | ICD-10-CM

## 2020-02-29 DIAGNOSIS — R490 Dysphonia: Secondary | ICD-10-CM

## 2020-02-29 NOTE — Therapy (Signed)
[place] MAIN [facility] SERVICES [address] [place], [place], [address] Phone: [phone]   Fax:  [phone]  Speech Language Pathology Treatment  Patient Details  Name: Randall [name] MRN: [id number] Date of Birth: 1951/05/19 Referring Provider (SLP): Dr. [name]   Encounter Date: 02/29/2020   End of Session - 02/29/20 1509    Visit Number 3    Number of Visits 17    Date for SLP Re-Evaluation 03/30/20    Authorization Type Medicare    Authorization Time Period Start 02/21/2020    Authorization - Visit Number 3    Progress Note Due on Visit 10    SLP Start Time 1400    SLP Stop Time  1500    SLP Time Calculation (min) 60 min    Activity Tolerance Patient tolerated treatment well           Past Medical History:  Diagnosis Date  . Allergic   . Colon adenoma   . Depression   . Diabetes mellitus, type II ([place])   . Diverticulosis   . Hypertension   . Seizures ([place])   . Sleep apnea   . Urinary hesitancy     Past Surgical History:  Procedure Laterality Date  . COLON SURGERY    . HERNIA REPAIR    . UVULOPALATOPHARYNGOPLASTY, TONSILLECTOMY AND SEPTOPLASTY      There were no vitals filed for this visit.   Subjective Assessment - 02/29/20 1508    Subjective Patient is eager to improve his speech                 ADULT SLP TREATMENT - 02/29/20 0001      General Information   Behavior/Cognition Alert;Cooperative;Pleasant mood    HPI 69 year old man diagnosed with Parkinson's disease.  Patient had a course of LSVT-LOUD in 2019, an MBSS 04/05/2019, and an MBS 12/15/2019.       Treatment Provided   Treatment provided Cognitive-Linquistic      Pain Assessment   Pain Assessment No/denies pain      Cognitive-Linquistic Treatment   Treatment focused on Voice    Skilled Treatment Daily Task #1 (Maximum sustained "ah"): Average 13 seconds, 85 dB. Daily Task 2 (Maximum fundamental frequency range): Highs: 15 high pitched "ah" (320  Hz). Lows: 15 low pitched "ah" (120 Hz). Daily task #3 (Maximum speech loudness drill of functional phrases): Average 73 dB.  Hierarchal speech loudness drill: Read phrases, 73 dB.  Generate single word responses, 72 dB.  Homework: given.  Off the cuff remarks: average 69 dB.        Assessment / Recommendations / Plan   Plan Continue with current plan of care      Progression Toward Goals   Progression toward goals Progressing toward goals            SLP Education - 02/29/20 1508    Education Details LSVT-LOUD    Person(s) Educated Patient    Methods Explanation    Comprehension Verbalized understanding              SLP Long Term Goals - 02/22/20 1341      SLP LONG TERM GOAL #1   Title The patient will complete Daily Tasks (Maximum duration "ah", High/Lows, and Functional Phrases) at average loudness of 80 dB and with loud, good quality voice.     Time 4    Period Weeks    Status New    Target Date 03/30/20  SLP LONG TERM GOAL #2   Title The patient will complete Hierarchal Speech Loudness reading drills (words/phrases, sentences, and paragraph) at average 75 dB and with loud, good quality voice.      Time 4    Period Weeks    Status New    Target Date 03/30/20      SLP LONG TERM GOAL #3   Title The patient will participate in conversation, maintaining average loudness of 75 dB and loud, good quality voice.    Time 4    Period Weeks    Status New    Target Date 03/30/20      SLP LONG TERM GOAL #4   Title The patient will complete homework daily.    Time 4    Period Weeks    Status New    Target Date 03/30/20            Plan - 02/29/20 1509    Clinical Impression Statement The patient is completing daily tasks and hierarchal speech drill tasks with loud, good quality voice given moderate SLP cues to slow speech sufficiently to be loud for all words.  He has minimal generalization into conversation without cues.    Speech Therapy Frequency 4x / week     Duration 4 weeks    Treatment/Interventions Pharyngeal strengthening exercises;SLP instruction and feedback;Patient/family education;Other (comment)    Potential to Achieve Goals Good    Potential Considerations Ability to learn/carryover information;Previous level of function;Co-morbidities;Severity of impairments;Cooperation/participation level;Medical prognosis;Family/community support    SLP Home Exercise Plan LSVT-LOUD daily homework    Consulted and Agree with Plan of Care Patient           Patient will benefit from skilled therapeutic intervention in order to improve the following deficits and impairments:   Dysphonia  Dysphagia, oropharyngeal phase    Problem List Patient Active Problem List   Diagnosis Date Noted  . Adenoma of large intestine 02/01/2016  . Clinical depression 02/01/2016  . 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 Benign essential HTN 02/01/2016  . 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/2016  . Pure hypercholesterolemia 02/01/2016  . Type 2 diabetes mellitus ([name]) 01/10/2016  . Microalbuminuria 01/10/2016  . Degeneration of intervertebral disc of lumbar region 07/13/2015  . Parkinson's disease ([name]) 11/16/2012   Randall Sea, MS/CCC- SLP  [name] 02/29/2020, 3:10 PM  [place] MAIN [facility] SERVICES [address] [place], [place], [address] Phone: [phone]   Fax:  [phone]   Name: Randall [name] MRN: [id number] Date of Birth: December 31, 1950

## 2020-03-01 ENCOUNTER — Telehealth: Payer: Self-pay

## 2020-03-01 ENCOUNTER — Ambulatory Visit: Payer: PPO | Admitting: Speech Pathology

## 2020-03-01 ENCOUNTER — Other Ambulatory Visit: Payer: Self-pay

## 2020-03-01 ENCOUNTER — Encounter: Payer: Self-pay | Admitting: Speech Pathology

## 2020-03-01 DIAGNOSIS — R1312 Dysphagia, oropharyngeal phase: Secondary | ICD-10-CM

## 2020-03-01 DIAGNOSIS — R490 Dysphonia: Secondary | ICD-10-CM | POA: Diagnosis not present

## 2020-03-01 NOTE — Therapy (Signed)
[place] MAIN [facility] SERVICES [address] [place], [place], [address] Phone: [phone]   Fax:  [phone]  Speech Language Pathology Treatment  Patient Details  Name: NANDAN WILLEMS MRN: [id number] Date of Birth: 1950/11/14 Referring Provider (SLP): Dr. [name]   Encounter Date: 03/01/2020   End of Session - 03/01/20 1511    Visit Number 4    Number of Visits 17    Date for SLP Re-Evaluation 03/30/20    Authorization Type Medicare    Authorization Time Period Start 02/21/2020    Authorization - Visit Number 4    Progress Note Due on Visit 10    SLP Start Time 1400    SLP Stop Time  1500    SLP Time Calculation (min) 60 min    Activity Tolerance Patient tolerated treatment well           Past Medical History:  Diagnosis Date  . Allergic   . Colon adenoma   . Depression   . Diabetes mellitus, type II ([place])   . Diverticulosis   . Hypertension   . Seizures ([place])   . Sleep apnea   . Urinary hesitancy     Past Surgical History:  Procedure Laterality Date  . COLON SURGERY    . HERNIA REPAIR    . UVULOPALATOPHARYNGOPLASTY, TONSILLECTOMY AND SEPTOPLASTY      There were no vitals filed for this visit.   Subjective Assessment - 03/01/20 1511    Subjective Patient is eager to improve his speech                 ADULT SLP TREATMENT - 03/01/20 0001      General Information   Behavior/Cognition Alert;Cooperative;Pleasant mood    HPI 69 year old man diagnosed with Parkinson's disease.  Patient had a course of LSVT-LOUD in 2019, an MBSS 04/05/2019, and an MBS 12/15/2019.       Treatment Provided   Treatment provided Cognitive-Linquistic      Pain Assessment   Pain Assessment No/denies pain      Cognitive-Linquistic Treatment   Treatment focused on Voice    Skilled Treatment Daily Task #1 (Maximum sustained "ah"): Average 13 seconds, 85 dB. Daily Task 2 (Maximum fundamental frequency range): Highs: 15 high pitched "ah" (320  Hz). Lows: 15 low pitched "ah" (120 Hz). Daily task #3 (Maximum speech loudness drill of functional phrases): Average 73 dB.  Hierarchal speech loudness drill: Read phrases, 73 dB.  Generate single word responses, 72 dB.  Homework: given.  Off the cuff remarks: average 69 dB.        Assessment / Recommendations / Plan   Plan Continue with current plan of care      Progression Toward Goals   Progression toward goals Progressing toward goals            SLP Education - 03/01/20 1511    Education Details LSVT-LOUD    Person(s) Educated Patient    Methods Explanation    Comprehension Verbalized understanding              SLP Long Term Goals - 02/22/20 1341      SLP LONG TERM GOAL #1   Title The patient will complete Daily Tasks (Maximum duration "ah", High/Lows, and Functional Phrases) at average loudness of 80 dB and with loud, good quality voice.     Time 4    Period Weeks    Status New    Target Date 03/30/20  SLP LONG TERM GOAL #2   Title The patient will complete Hierarchal Speech Loudness reading drills (words/phrases, sentences, and paragraph) at average 75 dB and with loud, good quality voice.      Time 4    Period Weeks    Status New    Target Date 03/30/20      SLP LONG TERM GOAL #3   Title The patient will participate in conversation, maintaining average loudness of 75 dB and loud, good quality voice.    Time 4    Period Weeks    Status New    Target Date 03/30/20      SLP LONG TERM GOAL #4   Title The patient will complete homework daily.    Time 4    Period Weeks    Status New    Target Date 03/30/20             Patient will benefit from skilled therapeutic intervention in order to improve the following deficits and impairments:   Dysphonia  Dysphagia, oropharyngeal phase    Problem List Patient Active Problem List   Diagnosis Date Noted  . Adenoma of large intestine 02/01/2016  . Clinical depression 02/01/2016  . ED (erectile  dysfunction) of organic origin 02/01/2016  . Benign essential HTN 02/01/2016  . 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/2016  . Pure hypercholesterolemia 02/01/2016  . Type 2 diabetes mellitus ([place]) 01/10/2016  . Microalbuminuria 01/10/2016  . Degeneration of intervertebral disc of lumbar region 07/13/2015  . Parkinson's disease ([place]) 11/16/2012   [name], MS/CCC- SLP  [name] 03/01/2020, 3:12 PM  [place] MAIN [facility] SERVICES [address] [place], [place], [address] Phone: [phone]   Fax:  [phone]   Name: KIELAN DREISBACH MRN: [id number] Date of Birth: 18-Dec-1950

## 2020-03-02 ENCOUNTER — Encounter: Payer: Self-pay | Admitting: Speech Pathology

## 2020-03-02 ENCOUNTER — Other Ambulatory Visit: Payer: Self-pay

## 2020-03-02 ENCOUNTER — Ambulatory Visit: Payer: PPO | Admitting: Speech Pathology

## 2020-03-02 DIAGNOSIS — R490 Dysphonia: Secondary | ICD-10-CM

## 2020-03-02 DIAGNOSIS — R1312 Dysphagia, oropharyngeal phase: Secondary | ICD-10-CM

## 2020-03-02 NOTE — Therapy (Signed)
[place] MAIN [facility] SERVICES [address] [place], [place], [address] Phone: [phone]   Fax:  [phone]  Speech Language Pathology Treatment  Patient Details  Name: Randall [name] MRN: [id number] Date of Birth: [birth year] Referring Provider (SLP): Dr. [name]   Encounter Date: 03/02/2020   End of Session - 03/02/20 1456    Visit Number 5    Number of Visits 17    Date for SLP Re-Evaluation 03/30/20    Authorization Type Medicare    Authorization Time Period Start 02/21/2020    Authorization - Visit Number 5    Progress Note Due on Visit 10    SLP Start Time 1400    SLP Stop Time  1500    SLP Time Calculation (min) 60 min    Activity Tolerance Patient tolerated treatment well           Past Medical History:  Diagnosis Date  . Allergic   . Colon adenoma   . Depression   . Diabetes mellitus, type II ([place])   . Diverticulosis   . Hypertension   . Seizures ([place])   . Sleep apnea   . Urinary hesitancy     Past Surgical History:  Procedure Laterality Date  . COLON SURGERY    . HERNIA REPAIR    . UVULOPALATOPHARYNGOPLASTY, TONSILLECTOMY AND SEPTOPLASTY      There were no vitals filed for this visit.   Subjective Assessment - 03/02/20 1455    Subjective Patient is eager to improve his speech                 ADULT SLP TREATMENT - 03/02/20 0001      General Information   Behavior/Cognition Alert;Cooperative;Pleasant mood    HPI 69 year old man diagnosed with Parkinson's disease.  Patient had a course of LSVT-LOUD in 2019, an MBSS 04/05/2019, and an MBS 12/15/2019.       Treatment Provided   Treatment provided Cognitive-Linquistic      Pain Assessment   Pain Assessment No/denies pain      Cognitive-Linquistic Treatment   Treatment focused on Voice    Skilled Treatment Daily Task #1 (Maximum sustained "ah"): Average 13 seconds, 85 dB. Daily Task 2 (Maximum fundamental frequency range): Highs: 15 high pitched "ah" (320  Hz). Lows: 15 low pitched "ah" (120 Hz). Daily task #3 (Maximum speech loudness drill of functional phrases): Average 75 dB.  Hierarchal speech loudness drill: Read phrases, 75 dB.  Generate phrase, 72 dB.  Homework: given.  Off the cuff remarks: average 69 dB, improves to 72+ dB given mod cues.        Assessment / Recommendations / Plan   Plan Continue with current plan of care      Progression Toward Goals   Progression toward goals Progressing toward goals            SLP Education - 03/02/20 1455    Education Details LSVT-LOUD, be loud for all syllables    Person(s) Educated Patient    Methods Explanation    Comprehension Verbalized understanding              SLP Long Term Goals - 02/22/20 1341      SLP LONG TERM GOAL #1   Title The patient will complete Daily Tasks (Maximum duration "ah", High/Lows, and Functional Phrases) at average loudness of 80 dB and with loud, good quality voice.     Time 4    Period Weeks    Status  New    Target Date 03/30/20      SLP LONG TERM GOAL #2   Title The patient will complete Hierarchal Speech Loudness reading drills (words/phrases, sentences, and paragraph) at average 75 dB and with loud, good quality voice.      Time 4    Period Weeks    Status New    Target Date 03/30/20      SLP LONG TERM GOAL #3   Title The patient will participate in conversation, maintaining average loudness of 75 dB and loud, good quality voice.    Time 4    Period Weeks    Status New    Target Date 03/30/20      SLP LONG TERM GOAL #4   Title The patient will complete homework daily.    Time 4    Period Weeks    Status New    Target Date 03/30/20            Plan - 03/02/20 1456    Clinical Impression Statement The patient is completing daily tasks and hierarchal speech drill tasks with loud, good quality voice given moderate SLP cues to slow speech sufficiently to be loud for all words.  He has minimal generalization into conversation without  cues.    Speech Therapy Frequency 4x / week    Treatment/Interventions Pharyngeal strengthening exercises;SLP instruction and feedback;Patient/family education;Other (comment)    Potential to Achieve Goals Good    Potential Considerations Ability to learn/carryover information;Previous level of function;Co-morbidities;Severity of impairments;Cooperation/participation level;Medical prognosis;Family/community support    SLP Home Exercise Plan LSVT-LOUD daily homework    Consulted and Agree with Plan of Care Patient           Patient will benefit from skilled therapeutic intervention in order to improve the following deficits and impairments:   Dysphonia  Dysphagia, oropharyngeal phase    Problem List Patient Active Problem List   Diagnosis Date Noted  . Adenoma of large intestine 02/01/2016  . Clinical depression 02/01/2016  . 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 Benign essential HTN 02/01/2016  . 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/2016  . Pure hypercholesterolemia 02/01/2016  . Type 2 diabetes mellitus ([place]) 01/10/2016  . Microalbuminuria 01/10/2016  . Degeneration of intervertebral disc of lumbar region 07/13/2015  . Parkinson's disease ([place]) 11/16/2012   [name], MS/CCC- SLP  [name] 03/02/2020, 2:57 PM  [place] MAIN [facility] SERVICES [address] [place], [place], [address] Phone: [phone]   Fax:  [phone]   Name: Randall [name] MRN: [id number] Date of Birth: 10-27-1950

## 2020-03-03 NOTE — Telephone Encounter (Signed)
none

## 2020-03-06 ENCOUNTER — Other Ambulatory Visit: Payer: Self-pay

## 2020-03-06 ENCOUNTER — Ambulatory Visit: Payer: PPO | Admitting: Speech Pathology

## 2020-03-06 ENCOUNTER — Encounter: Payer: Self-pay | Admitting: Speech Pathology

## 2020-03-06 DIAGNOSIS — R490 Dysphonia: Secondary | ICD-10-CM | POA: Diagnosis not present

## 2020-03-06 NOTE — Therapy (Signed)
[place] MAIN [facility] SERVICES [address] [place], [place], [address] Phone: [phone]   Fax:  [phone]  Speech Language Pathology Treatment  Patient Details  Name: Randall [name] MRN: [id number] Date of Birth: Jul 25, 1951 Referring Provider (SLP): Dr. [name]   Encounter Date: 03/06/2020   End of Session - 03/06/20 1559    Visit Number 6    Number of Visits 17    Date for SLP Re-Evaluation 03/30/20    Authorization Type Medicare    Authorization Time Period Start 02/21/2020    Authorization - Visit Number 6    Progress Note Due on Visit 10    SLP Start Time 0155    SLP Stop Time  0250    SLP Time Calculation (min) 55 min    Activity Tolerance Patient tolerated treatment well           Past Medical History:  Diagnosis Date  . Allergic   . Colon adenoma   . Depression   . Diabetes mellitus, type II ([place])   . Diverticulosis   . Hypertension   . Seizures ([place])   . Sleep apnea   . Urinary hesitancy     Past Surgical History:  Procedure Laterality Date  . COLON SURGERY    . HERNIA REPAIR    . UVULOPALATOPHARYNGOPLASTY, TONSILLECTOMY AND SEPTOPLASTY      There were no vitals filed for this visit.   Subjective Assessment - 03/06/20 1558    Subjective "I'm getting a little tired"                 ADULT SLP TREATMENT - 03/06/20 0001      General Information   Behavior/Cognition Alert;Cooperative;Pleasant mood    HPI 69 year old man diagnosed with Parkinson's disease.  Patient had a course of LSVT-LOUD in 2019, an MBSS 04/05/2019, and an MBS 12/15/2019.       Treatment Provided   Treatment provided Cognitive-Linquistic      Pain Assessment   Pain Assessment No/denies pain      Cognitive-Linquistic Treatment   Treatment focused on Voice    Skilled Treatment Daily Task #1 (Maximum sustained "ah"): Average 17 seconds, 83 dB. Daily Task 2 (Maximum fundamental frequency range): Highs: 15 high pitched "ah" (300 Hz). Lows:  15 low pitched "ah" (105 Hz). Daily task #3 (Maximum speech loudness drill of functional phrases): Average 72 dB.  Hierarchal speech loudness drill: Read sentences, 71 dB.  Homework: given.      Assessment / Recommendations / Plan   Plan Continue with current plan of care      Progression Toward Goals   Progression toward goals Progressing toward goals            SLP Education - 03/06/20 1559    Education Details LSVT-LOUD, be loud throughout the entire sentence    Person(s) Educated Patient    Methods Explanation    Comprehension Verbalized understanding              SLP Long Term Goals - 02/22/20 1341      SLP LONG TERM GOAL #1   Title The patient will complete Daily Tasks (Maximum duration "ah", High/Lows, and Functional Phrases) at average loudness of 80 dB and with loud, good quality voice.     Time 4    Period Weeks    Status New    Target Date 03/30/20      SLP LONG TERM GOAL #2   Title The patient will  complete Hierarchal Speech Loudness reading drills (words/phrases, sentences, and paragraph) at average 75 dB and with loud, good quality voice.      Time 4    Period Weeks    Status New    Target Date 03/30/20      SLP LONG TERM GOAL #3   Title The patient will participate in conversation, maintaining average loudness of 75 dB and loud, good quality voice.    Time 4    Period Weeks    Status New    Target Date 03/30/20      SLP LONG TERM GOAL #4   Title The patient will complete homework daily.    Time 4    Period Weeks    Status New    Target Date 03/30/20            Plan - 03/06/20 1600    Clinical Impression Statement The patient is completing daily tasks and hierarchal speech drill tasks with loud, good quality voice given moderate SLP cues to slow speech sufficiently to be loud for all words.  During the task of reading sentences, he would begin loud and progressively get softer toward the end of the sentence.    Speech Therapy Frequency 4x /  week    Duration 4 weeks    Treatment/Interventions Pharyngeal strengthening exercises;SLP instruction and feedback;Patient/family education;Other (comment)    Potential to Achieve Goals Good    Potential Considerations Ability to learn/carryover information;Previous level of function;Co-morbidities;Severity of impairments;Cooperation/participation level;Medical prognosis;Family/community support    SLP Home Exercise Plan LSVT-LOUD daily homework    Consulted and Agree with Plan of Care Patient           Patient will benefit from skilled therapeutic intervention in order to improve the following deficits and impairments:   Dysphonia    Problem List Patient Active Problem List   Diagnosis Date Noted  . Adenoma of large intestine 02/01/2016  . Clinical depression 02/01/2016  . 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 Benign essential HTN 02/01/2016  . 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/2016  . Pure hypercholesterolemia 02/01/2016  . Type 2 diabetes mellitus ([place]) 01/10/2016  . Microalbuminuria 01/10/2016  . Degeneration of intervertebral disc of lumbar region 07/13/2015  . Parkinson's disease ([place]) 11/16/2012    [name], Student Intern 03/06/2020, 4:00 PM  [place] MAIN [facility] SERVICES [address] [place], [place], [address] Phone: [phone]   Fax:  [phone]   Name: Randall [name] MRN: [id number] Date of Birth: 1951-09-03

## 2020-03-07 ENCOUNTER — Other Ambulatory Visit: Payer: Self-pay

## 2020-03-07 ENCOUNTER — Encounter: Payer: Self-pay | Admitting: Speech Pathology

## 2020-03-07 ENCOUNTER — Ambulatory Visit: Payer: PPO | Admitting: Speech Pathology

## 2020-03-07 DIAGNOSIS — R490 Dysphonia: Secondary | ICD-10-CM

## 2020-03-07 NOTE — Therapy (Signed)
[place] MAIN [facility] SERVICES [address] [place], [place], [address] Phone: [phone]   Fax:  [phone]  Speech Language Pathology Treatment  Patient Details  Name: Randall [name] MRN: [id number] Date of Birth: 05/17/1951 Referring Provider (SLP): Dr. [name]   Encounter Date: 03/07/2020   End of Session - 03/07/20 1605    Visit Number 7    Number of Visits 17    Date for SLP Re-Evaluation 03/30/20    Authorization Type Medicare    Authorization Time Period Start 02/21/2020    Authorization - Visit Number 7    Progress Note Due on Visit 10    SLP Start Time 0200    SLP Stop Time  0245    SLP Time Calculation (min) 45 min    Activity Tolerance Patient tolerated treatment well           Past Medical History:  Diagnosis Date  . Allergic   . Colon adenoma   . Depression   . Diabetes mellitus, type II ([place])   . Diverticulosis   . Hypertension   . Seizures ([place])   . Sleep apnea   . Urinary hesitancy     Past Surgical History:  Procedure Laterality Date  . COLON SURGERY    . HERNIA REPAIR    . UVULOPALATOPHARYNGOPLASTY, TONSILLECTOMY AND SEPTOPLASTY      There were no vitals filed for this visit.   Subjective Assessment - 03/07/20 1605    Subjective Hard-working, complained of some jaw soreness                 ADULT SLP TREATMENT - 03/07/20 0001      General Information   Behavior/Cognition Alert;Cooperative;Pleasant mood    HPI 69 year old man diagnosed with Parkinson's disease.  Patient had a course of LSVT-LOUD in 2019, an MBSS 04/05/2019, and an MBS 12/15/2019.       Treatment Provided   Treatment provided Cognitive-Linquistic      Pain Assessment   Pain Assessment No/denies pain      Cognitive-Linquistic Treatment   Treatment focused on Voice    Skilled Treatment Daily Task #1 (Maximum sustained "ah"): Average 11 seconds, 87 dB. Daily Task 2 (Maximum fundamental frequency range): Highs: 15 high pitched  "ah" (315 Hz). Lows: 15 low pitched "ah" (115 Hz). Daily task #3 (Maximum speech loudness drill of functional phrases): Average 76 dB.  Hierarchal speech loudness drill: Read sentences, 73 dB.  Homework: given.  Off the cuff remarks: average 69 dB.      Assessment / Recommendations / Plan   Plan Continue with current plan of care      Progression Toward Goals   Progression toward goals Progressing toward goals            SLP Education - 03/07/20 1605    Education Details LSVT-LOUD, be loud at all times    Person(s) Educated Patient    Methods Explanation;Demonstration    Comprehension Verbalized understanding;Returned demonstration              SLP Long Term Goals - 02/22/20 1341      SLP LONG TERM GOAL #1   Title The patient will complete Daily Tasks (Maximum duration "ah", High/Lows, and Functional Phrases) at average loudness of 80 dB and with loud, good quality voice.     Time 4    Period Weeks    Status New    Target Date 03/30/20      SLP LONG  TERM GOAL #2   Title The patient will complete Hierarchal Speech Loudness reading drills (words/phrases, sentences, and paragraph) at average 75 dB and with loud, good quality voice.      Time 4    Period Weeks    Status New    Target Date 03/30/20      SLP LONG TERM GOAL #3   Title The patient will participate in conversation, maintaining average loudness of 75 dB and loud, good quality voice.    Time 4    Period Weeks    Status New    Target Date 03/30/20      SLP LONG TERM GOAL #4   Title The patient will complete homework daily.    Time 4    Period Weeks    Status New    Target Date 03/30/20            Plan - 03/07/20 1605    Clinical Impression Statement The patient is completing daily tasks and hierarchal speech drill tasks with loud, good quality voice given moderate SLP cues to slow speech sufficiently to be loud for all words.  Still usually requires cueing to carry  over loud voice to conversational  speech.    Speech Therapy Frequency 4x / week    Duration 4 weeks    Treatment/Interventions Pharyngeal strengthening exercises;SLP instruction and feedback;Patient/family education;Other (comment)    Potential to Achieve Goals Good    Potential Considerations Ability to learn/carryover information;Previous level of function;Co-morbidities;Severity of impairments;Cooperation/participation level;Medical prognosis;Family/community support    SLP Home Exercise Plan LSVT-LOUD daily homework    Consulted and Agree with Plan of Care Patient           Patient will benefit from skilled therapeutic intervention in order to improve the following deficits and impairments:   Dysphonia    Problem List Patient Active Problem List   Diagnosis Date Noted  . Adenoma of large intestine 02/01/2016  . Clinical depression 02/01/2016  . 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 Benign essential HTN 02/01/2016  . 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/2016  . Pure hypercholesterolemia 02/01/2016  . Type 2 diabetes mellitus ([place]) 01/10/2016  . Microalbuminuria 01/10/2016  . Degeneration of intervertebral disc of lumbar region 07/13/2015  . Parkinson's disease ([place]) 11/16/2012    [name], Student Intern 03/07/2020, 4:06 PM  [place] MAIN [facility] SERVICES [address] [place], [place], [address] Phone: [phone]   Fax:  [phone]   Name: Randall [name] MRN: [id number] Date of Birth: 06-08-1951

## 2020-03-08 ENCOUNTER — Encounter: Payer: Self-pay | Admitting: Speech Pathology

## 2020-03-08 ENCOUNTER — Other Ambulatory Visit: Payer: PPO

## 2020-03-08 ENCOUNTER — Other Ambulatory Visit: Payer: Self-pay

## 2020-03-08 ENCOUNTER — Ambulatory Visit: Payer: PPO | Admitting: Speech Pathology

## 2020-03-08 DIAGNOSIS — R490 Dysphonia: Secondary | ICD-10-CM

## 2020-03-08 DIAGNOSIS — Z515 Encounter for palliative care: Secondary | ICD-10-CM | POA: Diagnosis not present

## 2020-03-08 NOTE — Progress Notes (Signed)
COMMUNITY PALLIATIVE CARE SW NOTE  PATIENT NAME: Randall [name] DOB: 09-05-1951 MRN: [id number]  PRIMARY CARE PROVIDER: Adin Hector, MD  RESPONSIBLE PARTY:  Acct ID - Guarantor Home Phone Work Phone Relationship Acct Type  [id number] - Zingale,Mackie B [phone]  Self P/F     [place], [place], [place] [address]     PLAN OF CARE and INTERVENTIONS:             1. GOALS OF CARE/ ADVANCE CARE PLANNING:  Patient is a FULL CODE. Goal is to remain at home and be as independent as possible.  2. SOCIAL/EMOTIONAL/SPIRITUAL ASSESSMENT/ INTERVENTIONS:  SW met with patient and [name] (patient's wife) in the home for scheduled in home visit. Patient had speech therapy today and stated that the session went well. Patient was able to project and speak well during visit. Wife shared that patient speaking to their Alexa system also helps with patients speech. Patient denies current pain, but stated sometimes his hip aches every now and then. Patient reports that he is sleeping pretty good. Patient shared that he tends to sleep due to his sleep apnea which in turns effects his side. Patient's eats well with no loss of appetite. Patient continues to experience tremors but states that the new Parkinsons medications Kynmobi which is helping his tremors when he takes it. Patient did express frustration with tremors. Patient is no longer driving due to tremors. Wife shared that she has been trying to get patient a dental appointment but has ran into some scheduling issues with the referred dental office due to the dentist not being able to see him for 2 months. Per dentist patient has an infection in a tooth that needs to be pulled. Wife will be calling dental office again tomorrow to inquire if there are any sooner appointments available. Patient is not in pain. SW advised patient and wife call their provider, [employer], to obtain a list of other in network dental providers that may be able to see patient  sooner than 2 months. SW discussed care and goals. Patient and wife and no other concerns or needs of palliative care team at this time. Will continue to follow. 3. PATIENT/CAREGIVER EDUCATION/ COPING: Patient is alert, engaged in conversation. Patient was pleasant. Patient openly expresses feelings with SW. Patient is positive about his situation and tries to maintain his outlook. Patient shared that he enjoys singing Felisa Bonier and has a Estate agent in the home. Patient acknowledges that he has a great support system between friends and family. 4. PERSONAL EMERGENCY PLAN: Patient will call 9-1-1 for emergencies.  5. COMMUNITY RESOURCES COORDINATION/ HEALTH CARE NAVIGATION:  Wife assist with care needs. Patient is receiving outpatient speech therapy. 6. FINANCIAL/LEGAL CONCERNS/INTERVENTIONS:  None.     SOCIAL HX:  Social History   Tobacco Use   Smoking status: Never Smoker   Smokeless tobacco: Never Used  Substance Use Topics   Alcohol use: Yes    Alcohol/week: 1.0 standard drink    Types: 1 Cans of beer per week    Comment: socially     CODE STATUS: FULL CODE ADVANCED DIRECTIVES: N MOST FORM COMPLETE:  Y HOSPICE EDUCATION PROVIDED: N  [id number]:[id number] is ambulatory without an assistive device. Patient has had falls in the past. Patient is able to do ADL's independently but wife is available to assist when needed.   Time Spent: 40 mins     [place], [place]

## 2020-03-08 NOTE — Therapy (Signed)
[place] MAIN [facility] SERVICES [address] [place], [place], [address] Phone: [phone]   Fax:  [phone]  Speech Language Pathology Treatment  Patient Details  Name: Randall [name] MRN: [id number] Date of Birth: 22-Apr-1951 Referring Provider (SLP): Dr. [name]   Encounter Date: 03/08/2020   End of Session - 03/08/20 1518    Visit Number 8    Number of Visits 17    Date for SLP Re-Evaluation 03/30/20    Authorization Type Medicare    Authorization Time Period Start 02/21/2020    Authorization - Visit Number 8    Progress Note Due on Visit 10    SLP Start Time 0300    SLP Stop Time  0350    SLP Time Calculation (min) 50 min    Activity Tolerance Patient tolerated treatment well           Past Medical History:  Diagnosis Date  . Allergic   . Colon adenoma   . Depression   . Diabetes mellitus, type II ([place])   . Diverticulosis   . Hypertension   . Seizures ([place])   . Sleep apnea   . Urinary hesitancy     Past Surgical History:  Procedure Laterality Date  . COLON SURGERY    . HERNIA REPAIR    . UVULOPALATOPHARYNGOPLASTY, TONSILLECTOMY AND SEPTOPLASTY      There were no vitals filed for this visit.   Subjective Assessment - 03/08/20 1517    Subjective "My neighbor noticed how well he could hear me"                 ADULT SLP TREATMENT - 03/08/20 0001      General Information   Behavior/Cognition Alert;Cooperative;Pleasant mood    HPI 69 year old man diagnosed with Parkinson's disease.  Patient had a course of LSVT-LOUD in 2019, an MBSS 04/05/2019, and an MBS 12/15/2019.       Treatment Provided   Treatment provided Cognitive-Linquistic      Pain Assessment   Pain Assessment No/denies pain      Cognitive-Linquistic Treatment   Treatment focused on Voice    Skilled Treatment Daily Task #1 (Maximum sustained "ah"): Average 15 seconds, 87 dB. Daily Task 2 (Maximum fundamental frequency range): Highs: 15 high pitched  "ah" (300 Hz). Lows: 15 low pitched "ah" (110 Hz). Daily task #3 (Maximum speech loudness drill of functional phrases): Average 77 dB.  Hierarchal speech loudness drill: Read sentences, 72 dB. Homework: given.  Off the cuff remarks: average 68 dB, improves to 73+ dB given mod cues.      Assessment / Recommendations / Plan   Plan Continue with current plan of care      Progression Toward Goals   Progression toward goals Progressing toward goals            SLP Education - 03/08/20 1518    Education Details LSVT-LOUD, be loud for all syllables    Person(s) Educated Patient    Methods Explanation;Demonstration    Comprehension Verbalized understanding;Returned demonstration              SLP Long Term Goals - 02/22/20 1341      SLP LONG TERM GOAL #1   Title The patient will complete Daily Tasks (Maximum duration "ah", High/Lows, and Functional Phrases) at average loudness of 80 dB and with loud, good quality voice.     Time 4    Period Weeks    Status New    Target  Date 03/30/20      SLP LONG TERM GOAL #2   Title The patient will complete Hierarchal Speech Loudness reading drills (words/phrases, sentences, and paragraph) at average 75 dB and with loud, good quality voice.      Time 4    Period Weeks    Status New    Target Date 03/30/20      SLP LONG TERM GOAL #3   Title The patient will participate in conversation, maintaining average loudness of 75 dB and loud, good quality voice.    Time 4    Period Weeks    Status New    Target Date 03/30/20      SLP LONG TERM GOAL #4   Title The patient will complete homework daily.    Time 4    Period Weeks    Status New    Target Date 03/30/20            Plan - 03/08/20 1519    Clinical Impression Statement The patient is completing daily tasks and hierarchal speech drill tasks with loud, good quality voice given moderate SLP cues to slow speech sufficiently to be loud for all words.  He is increasing generalization into  conversation, but still requires regular cueing.    Speech Therapy Frequency 4x / week    Duration 4 weeks    Treatment/Interventions Pharyngeal strengthening exercises;SLP instruction and feedback;Patient/family education;Other (comment)    Potential to Achieve Goals Good    Potential Considerations Ability to learn/carryover information;Previous level of function;Co-morbidities;Severity of impairments;Cooperation/participation level;Medical prognosis;Family/community support    SLP Home Exercise Plan LSVT-LOUD daily homework    Consulted and Agree with Plan of Care Patient           Patient will benefit from skilled therapeutic intervention in order to improve the following deficits and impairments:   Dysphonia    Problem List Patient Active Problem List   Diagnosis Date Noted  . Adenoma of large intestine 02/01/2016  . Clinical depression 02/01/2016  . 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 Benign essential HTN 02/01/2016  . 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/2016  . Pure hypercholesterolemia 02/01/2016  . Type 2 diabetes mellitus ([place]) 01/10/2016  . Microalbuminuria 01/10/2016  . Degeneration of intervertebral disc of lumbar region 07/13/2015  . Parkinson's disease ([place]) 11/16/2012    [name], Student Intern 03/08/2020, 3:19 PM  [place] MAIN [facility] SERVICES [address] [place], [place], [address] Phone: [phone]   Fax:  [phone]   Name: HAWARD POPE MRN: [id number] Date of Birth: 1951-07-05

## 2020-03-09 ENCOUNTER — Other Ambulatory Visit: Payer: Self-pay

## 2020-03-09 ENCOUNTER — Ambulatory Visit: Payer: PPO | Attending: Neurology | Admitting: Speech Pathology

## 2020-03-09 ENCOUNTER — Encounter: Payer: Self-pay | Admitting: Speech Pathology

## 2020-03-09 DIAGNOSIS — R490 Dysphonia: Secondary | ICD-10-CM | POA: Insufficient documentation

## 2020-03-09 DIAGNOSIS — R1312 Dysphagia, oropharyngeal phase: Secondary | ICD-10-CM | POA: Diagnosis not present

## 2020-03-09 NOTE — Therapy (Signed)
[place] MAIN [facility] SERVICES [address] [place], [place], [address] Phone: [phone]   Fax:  [phone]  Speech Language Pathology Treatment  Patient Details  Name: Randall [name] MRN: [id number] Date of Birth: 1951-08-22 Referring Provider (SLP): Dr. [name]   Encounter Date: 03/09/2020   End of Session - 03/09/20 1615    Visit Number 9    Number of Visits 17    Date for SLP Re-Evaluation 03/30/20    Authorization Type Medicare    Authorization Time Period Start 02/21/2020    Authorization - Visit Number 9    Progress Note Due on Visit 10    SLP Start Time 0300    SLP Stop Time  0350    SLP Time Calculation (min) 50 min    Activity Tolerance Patient tolerated treatment well           Past Medical History:  Diagnosis Date  . Allergic   . Colon adenoma   . Depression   . Diabetes mellitus, type II ([place])   . Diverticulosis   . Hypertension   . Seizures ([place])   . Sleep apnea   . Urinary hesitancy     Past Surgical History:  Procedure Laterality Date  . COLON SURGERY    . HERNIA REPAIR    . UVULOPALATOPHARYNGOPLASTY, TONSILLECTOMY AND SEPTOPLASTY      There were no vitals filed for this visit.   Subjective Assessment - 03/09/20 1615    Subjective Hard-working, determined                 ADULT SLP TREATMENT - 03/09/20 0001      General Information   Behavior/Cognition Alert;Cooperative;Pleasant mood    HPI 69 year old man diagnosed with Parkinson's disease.  Patient had a course of LSVT-LOUD in 2019, an MBSS 04/05/2019, and an MBS 12/15/2019.       Treatment Provided   Treatment provided Cognitive-Linquistic      Pain Assessment   Pain Assessment No/denies pain      Cognitive-Linquistic Treatment   Treatment focused on Voice    Skilled Treatment Daily Task #1 (Maximum sustained "ah"): Average 12 seconds, 88 dB. Daily Task 2 (Maximum fundamental frequency range): Highs: 15 high pitched "ah" (260 Hz). Lows: 15  low pitched "ah" (110 Hz). Daily task #3 (Maximum speech loudness drill of functional phrases): Average 77 dB.  Hierarchal speech loudness drill: Read sentences, 73 dB. Homework: given.  Off the cuff remarks: average 70 dB.      Assessment / Recommendations / Plan   Plan Continue with current plan of care      Progression Toward Goals   Progression toward goals Progressing toward goals            SLP Education - 03/09/20 1615    Education Details LSVT-LOUD, be loud throughout the entire sentence    Person(s) Educated Patient    Methods Explanation;Demonstration    Comprehension Verbalized understanding;Returned demonstration              SLP Long Term Goals - 02/22/20 1341      SLP LONG TERM GOAL #1   Title The patient will complete Daily Tasks (Maximum duration "ah", High/Lows, and Functional Phrases) at average loudness of 80 dB and with loud, good quality voice.     Time 4    Period Weeks    Status New    Target Date 03/30/20      SLP LONG TERM GOAL #2  Title The patient will complete Hierarchal Speech Loudness reading drills (words/phrases, sentences, and paragraph) at average 75 dB and with loud, good quality voice.      Time 4    Period Weeks    Status New    Target Date 03/30/20      SLP LONG TERM GOAL #3   Title The patient will participate in conversation, maintaining average loudness of 75 dB and loud, good quality voice.    Time 4    Period Weeks    Status New    Target Date 03/30/20      SLP LONG TERM GOAL #4   Title The patient will complete homework daily.    Time 4    Period Weeks    Status New    Target Date 03/30/20            Plan - 03/09/20 1616    Clinical Impression Statement The patient is completing daily tasks and hierarchal speech drill tasks with loud, good quality voice given moderate SLP cues to slow speech sufficiently to be loud for all words. Patient is gaining more confidence and is excited to talk about how he used his loud  voice outside the treatment room.    Speech Therapy Frequency 4x / week    Duration 4 weeks    Treatment/Interventions Pharyngeal strengthening exercises;SLP instruction and feedback;Patient/family education;Other (comment)    Potential to Achieve Goals Good    Potential Considerations Ability to learn/carryover information;Previous level of function;Co-morbidities;Severity of impairments;Cooperation/participation level;Medical prognosis;Family/community support    SLP Home Exercise Plan LSVT-LOUD daily homework    Consulted and Agree with Plan of Care Patient           Patient will benefit from skilled therapeutic intervention in order to improve the following deficits and impairments:   Dysphonia    Problem List Patient Active Problem List   Diagnosis Date Noted  . Adenoma of large intestine 02/01/2016  . Clinical depression 02/01/2016  . 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 Benign essential HTN 02/01/2016  . 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/2016  . Pure hypercholesterolemia 02/01/2016  . Type 2 diabetes mellitus ([place]) 01/10/2016  . Microalbuminuria 01/10/2016  . Degeneration of intervertebral disc of lumbar region 07/13/2015  . Parkinson's disease ([place]) 11/16/2012    [name], Student Intern 03/09/2020, 4:16 PM  [place] MAIN [facility] SERVICES [address] [place], [place], [address] Phone: [phone]   Fax:  [phone]   Name: Randall [name] MRN: [id number] Date of Birth: May 08, 1951

## 2020-03-14 ENCOUNTER — Other Ambulatory Visit: Payer: Self-pay

## 2020-03-14 ENCOUNTER — Ambulatory Visit: Payer: PPO | Admitting: Speech Pathology

## 2020-03-14 ENCOUNTER — Encounter: Payer: Self-pay | Admitting: Speech Pathology

## 2020-03-14 DIAGNOSIS — R490 Dysphonia: Secondary | ICD-10-CM | POA: Diagnosis not present

## 2020-03-14 NOTE — Therapy (Signed)
[place] MAIN [facility] SERVICES [address] [place], [place], [address] Phone: [phone]   Fax:  [phone]  Speech Language Pathology Treatment  Speech Therapy Progress Note   Dates of reporting period  02/21/2020   to  03/14/2020   Patient Details  Name: Randall [name] MRN: [id number] Date of Birth: Dec 23, 1950 Referring Provider (SLP): Dr. [name]   Encounter Date: 03/14/2020   End of Session - 03/14/20 1558    Visit Number 10    Number of Visits 17    Date for SLP Re-Evaluation 03/30/20    Authorization Type Medicare    Authorization Time Period Start 02/21/2020    Authorization - Visit Number 10    Progress Note Due on Visit 10    SLP Start Time 0100    SLP Stop Time  0145    SLP Time Calculation (min) 45 min    Activity Tolerance Patient tolerated treatment well           Past Medical History:  Diagnosis Date  . Allergic   . Colon adenoma   . Depression   . Diabetes mellitus, type II ([place])   . Diverticulosis   . Hypertension   . Seizures ([place])   . Sleep apnea   . Urinary hesitancy     Past Surgical History:  Procedure Laterality Date  . COLON SURGERY    . HERNIA REPAIR    . UVULOPALATOPHARYNGOPLASTY, TONSILLECTOMY AND SEPTOPLASTY      There were no vitals filed for this visit.   Subjective Assessment - 03/14/20 1558    Subjective "I'm having a bad day, I don't know if I can even do this"                   SLP Education - 03/14/20 1558    Education Details LSVT-LOUD, be loud at all times    Person(s) Educated Patient    Methods Explanation;Demonstration    Comprehension Verbalized understanding;Returned demonstration              SLP Long Term Goals - 03/15/20 8242      SLP LONG TERM GOAL #1   Title The patient will complete Daily Tasks (Maximum duration "ah", High/Lows, and Functional Phrases) at average loudness of 80 dB and with loud, good quality voice.     Status Partially Met    Target  Date 03/30/20      SLP LONG TERM GOAL #2   Title The patient will complete Hierarchal Speech Loudness reading drills (words/phrases, sentences, and paragraph) at average 75 dB and with loud, good quality voice.      Status Partially Met    Target Date 03/30/20      SLP LONG TERM GOAL #3   Title The patient will participate in conversation, maintaining average loudness of 75 dB and loud, good quality voice.    Status Partially Met    Target Date 03/30/20      SLP LONG TERM GOAL #4   Title The patient will complete homework daily.    Status On-going    Target Date 03/30/20            Plan - 03/14/20 1559    Clinical Impression Statement The patient is completing daily tasks and hierarchal speech drill tasks with loud, good quality voice given moderate SLP cues to slow speech sufficiently to be loud for all words. He was not as loud today due to jaw pain and more severe  tremors than usual.    Speech Therapy Frequency 4x / week    Duration 4 weeks    Treatment/Interventions Pharyngeal strengthening exercises;SLP instruction and feedback;Patient/family education;Other (comment)    Potential to Achieve Goals Good    Potential Considerations Ability to learn/carryover information;Previous level of function;Co-morbidities;Severity of impairments;Cooperation/participation level;Medical prognosis;Family/community support    SLP Home Exercise Plan LSVT-LOUD daily homework    Consulted and Agree with Plan of Care Patient           Patient will benefit from skilled therapeutic intervention in order to improve the following deficits and impairments:   Dysphonia    Problem List Patient Active Problem List   Diagnosis Date Noted  . Adenoma of large intestine 02/01/2016  . Clinical depression 02/01/2016  . 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 Benign essential HTN 02/01/2016  . 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/2016  . Pure hypercholesterolemia 02/01/2016  . Type  2 diabetes mellitus ([place]) 01/10/2016  . Microalbuminuria 01/10/2016  . Degeneration of intervertebral disc of lumbar region 07/13/2015  . Parkinson's disease ([place]) 11/16/2012    [name], Student Intern 03/15/2020, 8:26 AM  [place] MAIN [facility] SERVICES [address] [place], [place], [address] Phone: [phone]   Fax:  [phone]   Name: Randall [name] MRN: [id number] Date of Birth: 03/28/1951

## 2020-03-15 ENCOUNTER — Ambulatory Visit: Payer: PPO | Admitting: Speech Pathology

## 2020-03-15 ENCOUNTER — Other Ambulatory Visit: Payer: Self-pay

## 2020-03-15 ENCOUNTER — Encounter: Payer: Self-pay | Admitting: Speech Pathology

## 2020-03-15 DIAGNOSIS — R490 Dysphonia: Secondary | ICD-10-CM | POA: Diagnosis not present

## 2020-03-15 NOTE — Therapy (Signed)
[place] MAIN [facility] SERVICES [address] [place], [place], [address] Phone: [phone]   Fax:  [phone]  Speech Language Pathology Treatment  Patient Details  Name: Randall [name] MRN: [id number] Date of Birth: 07/17/1951 Referring Provider (SLP): Dr. [name]   Encounter Date: 03/15/2020   End of Session - 03/15/20 1457    Visit Number 11    Number of Visits 17    Date for SLP Re-Evaluation 03/30/20    Authorization Type Medicare    Authorization Time Period Start 02/21/2020    Authorization - Visit Number 11    Progress Note Due on Visit 10    SLP Start Time 0150    SLP Stop Time  0240    SLP Time Calculation (min) 50 min    Activity Tolerance Patient tolerated treatment well           Past Medical History:  Diagnosis Date  . Allergic   . Colon adenoma   . Depression   . Diabetes mellitus, type II ([place])   . Diverticulosis   . Hypertension   . Seizures ([place])   . Sleep apnea   . Urinary hesitancy     Past Surgical History:  Procedure Laterality Date  . COLON SURGERY    . HERNIA REPAIR    . UVULOPALATOPHARYNGOPLASTY, TONSILLECTOMY AND SEPTOPLASTY      There were no vitals filed for this visit.   Subjective Assessment - 03/15/20 1456    Subjective Feeling better, "I'm giving you my 10 effort"                 ADULT SLP TREATMENT - 03/15/20 0001      General Information   Behavior/Cognition Alert;Cooperative;Pleasant mood    HPI 69 year old man diagnosed with Parkinson's disease.  Patient had a course of LSVT-LOUD in 2019, an MBSS 04/05/2019, and an MBS 12/15/2019.       Treatment Provided   Treatment provided Cognitive-Linquistic      Pain Assessment   Pain Assessment No/denies pain      Cognitive-Linquistic Treatment   Treatment focused on Voice    Skilled Treatment Daily Task #1 (Maximum sustained "ah"): Average 10 seconds, 85 dB. Daily Task 2 (Maximum fundamental frequency range): Highs: 15 high pitched  "ah" (230 Hz). Lows: 15 low pitched "ah" (110 Hz). Daily task #3 (Maximum speech loudness drill of functional phrases): Average 77 dB.  Hierarchal speech loudness drill: Read paragraphs: 75 dB.  Homework: given.  Off the cuff remarks: average 69 dB, improves to 74+ dB given mod cues.      Assessment / Recommendations / Plan   Plan Continue with current plan of care      Progression Toward Goals   Progression toward goals Progressing toward goals            SLP Education - 03/15/20 1456    Education Details LSVT-LOUD, be loud at all times    Person(s) Educated Patient    Methods Explanation;Demonstration    Comprehension Verbalized understanding;Returned demonstration              SLP Long Term Goals - 03/15/20 1607      SLP LONG TERM GOAL #1   Title The patient will complete Daily Tasks (Maximum duration "ah", High/Lows, and Functional Phrases) at average loudness of 80 dB and with loud, good quality voice.     Status Partially Met    Target Date 03/30/20      SLP LONG  TERM GOAL #2   Title The patient will complete Hierarchal Speech Loudness reading drills (words/phrases, sentences, and paragraph) at average 75 dB and with loud, good quality voice.      Status Partially Met    Target Date 03/30/20      SLP LONG TERM GOAL #3   Title The patient will participate in conversation, maintaining average loudness of 75 dB and loud, good quality voice.    Status Partially Met    Target Date 03/30/20      SLP LONG TERM GOAL #4   Title The patient will complete homework daily.    Status On-going    Target Date 03/30/20            Plan - 03/15/20 1457    Clinical Impression Statement The patient is completing daily tasks and hierarchal speech drill tasks with loud, good quality voice given moderate SLP cues to slow speech sufficiently to be loud for all words.  He has some generalization into conversation, but still requires cueing. Patient notices when he is not using a good  quality voice.    Speech Therapy Frequency 4x / week    Duration 4 weeks    Treatment/Interventions Pharyngeal strengthening exercises;SLP instruction and feedback;Patient/family education;Other (comment)    Potential to Achieve Goals Good    Potential Considerations Ability to learn/carryover information;Previous level of function;Co-morbidities;Severity of impairments;Cooperation/participation level;Medical prognosis;Family/community support    SLP Home Exercise Plan LSVT-LOUD daily homework    Consulted and Agree with Plan of Care Patient           Patient will benefit from skilled therapeutic intervention in order to improve the following deficits and impairments:   Dysphonia    Problem List Patient Active Problem List   Diagnosis Date Noted  . Adenoma of large intestine 02/01/2016  . Clinical depression 02/01/2016  . 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 Benign essential HTN 02/01/2016  . 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/2016  . Pure hypercholesterolemia 02/01/2016  . Type 2 diabetes mellitus ([place]) 01/10/2016  . Microalbuminuria 01/10/2016  . Degeneration of intervertebral disc of lumbar region 07/13/2015  . Parkinson's disease ([place]) 11/16/2012    [name], Student Intern 03/15/2020, 2:58 PM  [place] MAIN [facility] SERVICES [address] [place], [place], [address] Phone: [phone]   Fax:  [phone]   Name: Randall [name] MRN: [id number] Date of Birth: 1950/09/27

## 2020-03-16 ENCOUNTER — Ambulatory Visit: Payer: PPO | Admitting: Speech Pathology

## 2020-03-20 ENCOUNTER — Ambulatory Visit: Payer: PPO | Admitting: Speech Pathology

## 2020-03-21 ENCOUNTER — Ambulatory Visit: Payer: PPO | Admitting: Speech Pathology

## 2020-03-22 ENCOUNTER — Other Ambulatory Visit: Payer: Self-pay

## 2020-03-22 ENCOUNTER — Ambulatory Visit: Payer: PPO | Admitting: Speech Pathology

## 2020-03-22 ENCOUNTER — Encounter: Payer: Self-pay | Admitting: Speech Pathology

## 2020-03-22 DIAGNOSIS — R490 Dysphonia: Secondary | ICD-10-CM

## 2020-03-22 NOTE — Therapy (Signed)
[place] MAIN [facility] SERVICES [address] [place], [place], [address] Phone: [phone]   Fax:  [phone]  Speech Language Pathology Treatment  Patient Details  Name: Randall [name] MRN: [id number] Date of Birth: 04-Jun-1951 Referring Provider (SLP): Dr. [name]   Encounter Date: 03/22/2020   End of Session - 03/22/20 1557    Visit Number 12    Number of Visits 17    Date for SLP Re-Evaluation 03/30/20    Authorization Type Medicare    Authorization Time Period Start 02/21/2020    Authorization - Visit Number 12    Progress Note Due on Visit 10    SLP Start Time 0200    SLP Stop Time  0245    SLP Time Calculation (min) 45 min    Activity Tolerance Patient tolerated treatment well           Past Medical History:  Diagnosis Date  . Allergic   . Colon adenoma   . Depression   . Diabetes mellitus, type II ([place])   . Diverticulosis   . Hypertension   . Seizures ([place])   . Sleep apnea   . Urinary hesitancy     Past Surgical History:  Procedure Laterality Date  . COLON SURGERY    . HERNIA REPAIR    . UVULOPALATOPHARYNGOPLASTY, TONSILLECTOMY AND SEPTOPLASTY      There were no vitals filed for this visit.   Subjective Assessment - 03/22/20 1557    Subjective "I think I'm sounding better"                 ADULT SLP TREATMENT - 03/22/20 0001      General Information   Behavior/Cognition Alert;Cooperative;Pleasant mood    HPI 69 year old man diagnosed with Parkinson's disease.  Patient had a course of LSVT-LOUD in 2019, an MBSS 04/05/2019, and an MBS 12/15/2019.       Treatment Provided   Treatment provided Cognitive-Linquistic      Pain Assessment   Pain Assessment No/denies pain      Cognitive-Linquistic Treatment   Treatment focused on Voice    Skilled Treatment Daily Task #1 (Maximum sustained "ah"): Average 9 seconds, 86 dB. Daily Task 2 (Maximum fundamental frequency range): Highs: 15 high pitched "ah" (300 Hz).  Lows: 15 low pitched "ah" (105 Hz). Daily task #3 (Maximum speech loudness drill of functional phrases): Average 78 dB.  Hierarchal speech loudness drill: Read paragraphs: 73 dB.  Homework: given.  Off the cuff remarks: average 70 dB, improves to 74+ dB given mod cues.      Assessment / Recommendations / Plan   Plan Continue with current plan of care      Progression Toward Goals   Progression toward goals Progressing toward goals            SLP Education - 03/22/20 1557    Education Details LSVT-LOUD, be loud at all times    Person(s) Educated Patient    Methods Explanation;Demonstration    Comprehension Verbalized understanding;Returned demonstration              SLP Long Term Goals - 03/15/20 7371      SLP LONG TERM GOAL #1   Title The patient will complete Daily Tasks (Maximum duration "ah", High/Lows, and Functional Phrases) at average loudness of 80 dB and with loud, good quality voice.     Status Partially Met    Target Date 03/30/20      SLP LONG TERM GOAL #2  Title The patient will complete Hierarchal Speech Loudness reading drills (words/phrases, sentences, and paragraph) at average 75 dB and with loud, good quality voice.      Status Partially Met    Target Date 03/30/20      SLP LONG TERM GOAL #3   Title The patient will participate in conversation, maintaining average loudness of 75 dB and loud, good quality voice.    Status Partially Met    Target Date 03/30/20      SLP LONG TERM GOAL #4   Title The patient will complete homework daily.    Status On-going    Target Date 03/30/20            Plan - 03/22/20 1558    Clinical Impression Statement The patient is completing daily tasks and hierarchal speech drill tasks with loud, good quality voice given moderate SLP cues to slow speech sufficiently to be loud for all words. Requires cueing to maintain loud speech during paragraph reading.    Speech Therapy Frequency 4x / week    Duration 4 weeks     Treatment/Interventions Pharyngeal strengthening exercises;SLP instruction and feedback;Patient/family education;Other (comment)    Potential to Achieve Goals Good    Potential Considerations Ability to learn/carryover information;Previous level of function;Co-morbidities;Severity of impairments;Cooperation/participation level;Medical prognosis;Family/community support    SLP Home Exercise Plan LSVT-LOUD daily homework    Consulted and Agree with Plan of Care Patient           Patient will benefit from skilled therapeutic intervention in order to improve the following deficits and impairments:   Dysphonia    Problem List Patient Active Problem List   Diagnosis Date Noted  . Adenoma of large intestine 02/01/2016  . Clinical depression 02/01/2016  . 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 Benign essential HTN 02/01/2016  . 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/2016  . Pure hypercholesterolemia 02/01/2016  . Type 2 diabetes mellitus ([place]) 01/10/2016  . Microalbuminuria 01/10/2016  . Degeneration of intervertebral disc of lumbar region 07/13/2015  . Parkinson's disease ([place]) 11/16/2012    [name], Student Intern 03/22/2020, 3:59 PM  [place] MAIN [facility] SERVICES [address] [place], [place], [address] Phone: [phone]   Fax:  [phone]   Name: Randall [name] MRN: [id number] Date of Birth: [birth year]

## 2020-03-23 ENCOUNTER — Ambulatory Visit: Payer: PPO | Admitting: Speech Pathology

## 2020-03-23 ENCOUNTER — Encounter: Payer: Self-pay | Admitting: Speech Pathology

## 2020-03-23 ENCOUNTER — Other Ambulatory Visit: Payer: Self-pay

## 2020-03-23 DIAGNOSIS — R490 Dysphonia: Secondary | ICD-10-CM

## 2020-03-23 DIAGNOSIS — R1312 Dysphagia, oropharyngeal phase: Secondary | ICD-10-CM

## 2020-03-23 NOTE — Therapy (Signed)
[place] MAIN [facility] SERVICES [address] [place], [place], [address] Phone: [phone]   Fax:  [phone]  Speech Language Pathology Treatment  Patient Details  Name: Randall [name] MRN: [id number] Date of Birth: [birth year] Referring Provider (SLP): Dr. [name]   Encounter Date: 03/23/2020   End of Session - 03/23/20 1549    Visit Number 13    Number of Visits 17    Date for SLP Re-Evaluation 03/30/20    Authorization Type Medicare    Authorization Time Period Start 02/21/2020    Authorization - Visit Number 3    Progress Note Due on Visit 10    SLP Start Time 1400    SLP Stop Time  1455    SLP Time Calculation (min) 55 min    Activity Tolerance Patient tolerated treatment well           Past Medical History:  Diagnosis Date  . Allergic   . Colon adenoma   . Depression   . Diabetes mellitus, type II ([place])   . Diverticulosis   . Hypertension   . Seizures ([place])   . Sleep apnea   . Urinary hesitancy     Past Surgical History:  Procedure Laterality Date  . COLON SURGERY    . HERNIA REPAIR    . UVULOPALATOPHARYNGOPLASTY, TONSILLECTOMY AND SEPTOPLASTY      There were no vitals filed for this visit.   Subjective Assessment - 03/23/20 1549    Subjective "I think I'm sounding better"                 ADULT SLP TREATMENT - 03/23/20 0001      General Information   Behavior/Cognition Alert;Cooperative;Pleasant mood    HPI 69 year old man diagnosed with Parkinson's disease.  Patient had a course of LSVT-LOUD in 2019, an MBSS 04/05/2019, and an MBS 12/15/2019.       Treatment Provided   Treatment provided Cognitive-Linquistic      Pain Assessment   Pain Assessment No/denies pain      Cognitive-Linquistic Treatment   Treatment focused on Voice    Skilled Treatment Daily Task #1 (Maximum sustained "ah"): Average 9 seconds, 86 dB. Daily Task 2 (Maximum fundamental frequency range): Highs: 15 high pitched "ah" (300 Hz).  Lows: 15 low pitched "ah" (105 Hz). Daily task #3 (Maximum speech loudness drill of functional phrases): Average 78 dB.  Hierarchal speech loudness drill: Read paragraphs: 73 dB.  Homework: given.  Off the cuff remarks: average 70 dB, improves to 74+ dB given mod cues.      Assessment / Recommendations / Plan   Plan Continue with current plan of care      Progression Toward Goals   Progression toward goals Progressing toward goals            SLP Education - 03/23/20 1549    Education Details LSVT-LOUD    Person(s) Educated Patient    Methods Explanation    Comprehension Verbalized understanding              SLP Long Term Goals - 03/15/20 6962      SLP LONG TERM GOAL #1   Title The patient will complete Daily Tasks (Maximum duration "ah", High/Lows, and Functional Phrases) at average loudness of 80 dB and with loud, good quality voice.     Status Partially Met    Target Date 03/30/20      SLP LONG TERM GOAL #2   Title The patient will  complete Hierarchal Speech Loudness reading drills (words/phrases, sentences, and paragraph) at average 75 dB and with loud, good quality voice.      Status Partially Met    Target Date 03/30/20      SLP LONG TERM GOAL #3   Title The patient will participate in conversation, maintaining average loudness of 75 dB and loud, good quality voice.    Status Partially Met    Target Date 03/30/20      SLP LONG TERM GOAL #4   Title The patient will complete homework daily.    Status On-going    Target Date 03/30/20            Plan - 03/23/20 1550    Clinical Impression Statement The patient is completing daily tasks and hierarchal speech drill tasks with loud, good quality voice given moderate SLP cues to slow speech sufficiently to be loud for all words. Requires cueing to maintain loud speech during paragraph reading.    Speech Therapy Frequency 4x / week    Duration 4 weeks    Treatment/Interventions Pharyngeal strengthening exercises;SLP  instruction and feedback;Patient/family education;Other (comment)    Potential to Achieve Goals Good    Potential Considerations Ability to learn/carryover information;Previous level of function;Co-morbidities;Severity of impairments;Cooperation/participation level;Medical prognosis;Family/community support    SLP Home Exercise Plan LSVT-LOUD daily homework    Consulted and Agree with Plan of Care Patient           Patient will benefit from skilled therapeutic intervention in order to improve the following deficits and impairments:   Dysphonia  Dysphagia, oropharyngeal phase    Problem List Patient Active Problem List   Diagnosis Date Noted  . Adenoma of large intestine 02/01/2016  . Clinical depression 02/01/2016  . 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 Benign essential HTN 02/01/2016  . 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/2016  . Pure hypercholesterolemia 02/01/2016  . Type 2 diabetes mellitus ([place]) 01/10/2016  . Microalbuminuria 01/10/2016  . Degeneration of intervertebral disc of lumbar region 07/13/2015  . Parkinson's disease ([place]) 11/16/2012   [name], MS/CCC- SLP  [name] 03/23/2020, 3:51 PM  [place] MAIN [facility] SERVICES [address] [place], [place], [address] Phone: [phone]   Fax:  [phone]   Name: RADAMES MEJORADO MRN: [id number] Date of Birth: August 09, 1951

## 2020-03-27 ENCOUNTER — Encounter: Payer: PPO | Admitting: Speech Pathology

## 2020-03-27 DIAGNOSIS — F0280 Dementia in other diseases classified elsewhere without behavioral disturbance: Secondary | ICD-10-CM | POA: Diagnosis not present

## 2020-03-27 DIAGNOSIS — G20 Parkinson's disease: Secondary | ICD-10-CM | POA: Diagnosis not present

## 2020-03-27 DIAGNOSIS — R2689 Other abnormalities of gait and mobility: Secondary | ICD-10-CM | POA: Diagnosis not present

## 2020-03-27 DIAGNOSIS — F3340 Major depressive disorder, recurrent, in remission, unspecified: Secondary | ICD-10-CM | POA: Diagnosis not present

## 2020-03-27 DIAGNOSIS — F411 Generalized anxiety disorder: Secondary | ICD-10-CM | POA: Diagnosis not present

## 2020-03-28 ENCOUNTER — Encounter: Payer: Self-pay | Admitting: Speech Pathology

## 2020-03-28 ENCOUNTER — Other Ambulatory Visit: Payer: Self-pay

## 2020-03-28 ENCOUNTER — Ambulatory Visit: Payer: PPO | Admitting: Speech Pathology

## 2020-03-28 DIAGNOSIS — R490 Dysphonia: Secondary | ICD-10-CM

## 2020-03-28 NOTE — Therapy (Signed)
[place] MAIN [facility] SERVICES [address] [place], [place], [address] Phone: [phone]   Fax:  [phone]  Speech Language Pathology Treatment  Patient Details  Name: Randall [name] MRN: [id number] Date of Birth: 09-Dec-1950 Referring Provider (SLP): Dr. [name]   Encounter Date: 03/28/2020   End of Session - 03/28/20 1550    Visit Number 14    Number of Visits 17    Date for SLP Re-Evaluation 03/30/20    Authorization Type Medicare    Authorization Time Period Start 02/21/2020    Authorization - Visit Number 4    Progress Note Due on Visit 10    SLP Start Time 0200    SLP Stop Time  0245    SLP Time Calculation (min) 45 min    Activity Tolerance Patient tolerated treatment well           Past Medical History:  Diagnosis Date   Allergic    Colon adenoma    Depression    Diabetes mellitus, type II ([place])    Diverticulosis    Hypertension    Seizures ([place])    Sleep apnea    Urinary hesitancy     Past Surgical History:  Procedure Laterality Date   COLON SURGERY     HERNIA REPAIR     UVULOPALATOPHARYNGOPLASTY, TONSILLECTOMY AND SEPTOPLASTY      There were no vitals filed for this visit.   Subjective Assessment - 03/28/20 1550    Subjective I sounded a little better today than I do at home                 ADULT SLP TREATMENT - 03/28/20 0001      General Information   Behavior/Cognition Alert;Cooperative;Pleasant mood    HPI 69 year old man diagnosed with Parkinson's disease.  Patient had a course of LSVT-LOUD in 2019, an MBSS 04/05/2019, and an MBS 12/15/2019.       Treatment Provided   Treatment provided Cognitive-Linquistic      Pain Assessment   Pain Assessment No/denies pain      Cognitive-Linquistic Treatment   Treatment focused on Voice    Skilled Treatment Daily Task #1 (Maximum sustained "ah"): Average 8 seconds, 84 dB. Daily Task 2 (Maximum fundamental frequency range): Highs: 15 high  pitched "ah" (310 Hz). Lows: 15 low pitched "ah" (110 Hz). Daily task #3 (Maximum speech loudness drill of functional phrases): Average 77 dB.  Hierarchal speech loudness drill: conversational speech: 73 dB.  Homework: given.  Off the cuff remarks: average 71 dB, improves to 74+ dB given mod cues.      Assessment / Recommendations / Plan   Plan Continue with current plan of care      Progression Toward Goals   Progression toward goals Progressing toward goals            SLP Education - 03/28/20 1550    Education Details LSVT-LOUD    Person(s) Educated Patient    Methods Explanation;Demonstration    Comprehension Verbalized understanding;Returned demonstration              SLP Long Term Goals - 03/15/20 0821      SLP LONG TERM GOAL #1   Title The patient will complete Daily Tasks (Maximum duration "ah", High/Lows, and Functional Phrases) at average loudness of 80 dB and with loud, good quality voice.     Status Partially Met    Target Date 03/30/20      SLP LONG TERM GOAL #  2   Title The patient will complete Hierarchal Speech Loudness reading drills (words/phrases, sentences, and paragraph) at average 75 dB and with loud, good quality voice.      Status Partially Met    Target Date 03/30/20      SLP LONG TERM GOAL #3   Title The patient will participate in conversation, maintaining average loudness of 75 dB and loud, good quality voice.    Status Partially Met    Target Date 03/30/20      SLP LONG TERM GOAL #4   Title The patient will complete homework daily.    Status On-going    Target Date 03/30/20            Plan - 03/28/20 1551    Clinical Impression Statement The patient is completing daily tasks and hierarchal speech drill tasks with loud, good quality voice given moderate SLP cues to slow speech sufficiently to be loud for all words. Requires cueing to maintain loud speech during conversational speech.    Speech Therapy Frequency 4x / week    Duration 4  weeks    Treatment/Interventions Pharyngeal strengthening exercises;SLP instruction and feedback;Patient/family education;Other (comment)    Potential to Achieve Goals Good    Potential Considerations Ability to learn/carryover information;Previous level of function;Co-morbidities;Severity of impairments;Cooperation/participation level;Medical prognosis;Family/community support    SLP Home Exercise Plan LSVT-LOUD daily homework    Consulted and Agree with Plan of Care Patient           Patient will benefit from skilled therapeutic intervention in order to improve the following deficits and impairments:   Dysphonia    Problem List Patient Active Problem List   Diagnosis Date Noted   Adenoma of large intestine 02/01/2016   Clinical depression 02/01/2016   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   Benign essential HTN 02/01/2016   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/2016   Pure hypercholesterolemia 02/01/2016   Type 2 diabetes mellitus ([place]) 01/10/2016   Microalbuminuria 01/10/2016   Degeneration of intervertebral disc of lumbar region 07/13/2015   Parkinson's disease ([place]) 11/16/2012    [name], Student Intern 03/28/2020, 3:51 PM  [place] MAIN [place] [address] [place], [place], [address] Phone: [phone]   Fax:  [phone]   Name: Randall [name] MRN: [id number] Date of Birth: [birth year]

## 2020-03-29 ENCOUNTER — Other Ambulatory Visit: Payer: Self-pay

## 2020-03-29 ENCOUNTER — Encounter: Payer: Self-pay | Admitting: Speech Pathology

## 2020-03-29 ENCOUNTER — Ambulatory Visit: Payer: PPO | Admitting: Speech Pathology

## 2020-03-29 DIAGNOSIS — R490 Dysphonia: Secondary | ICD-10-CM

## 2020-03-29 NOTE — Therapy (Signed)
[place] [facility] REHAB SERVICES [address] [place], [place], [address] Phone: [phone]   Fax:  [phone]  Speech Language Pathology Treatment  Patient Details  Name: Randall [name] MRN: [id number] Date of Birth: 07/03/1951 Referring Provider (SLP): Dr. Shah   Encounter Date: 03/29/2020   End of Session - 03/29/20 1550    Visit Number 15    Number of Visits 17    Date for SLP Re-Evaluation 03/30/20    Authorization Type Medicare    Authorization Time Period Start 02/21/2020    Authorization - Visit Number 5    Progress Note Due on Visit 10    SLP Start Time 0155    SLP Stop Time  0245    SLP Time Calculation (min) 50 min    Activity Tolerance Patient tolerated treatment well           Past Medical History:  Diagnosis Date  . Allergic   . Colon adenoma   . Depression   . Diabetes mellitus, type II ([facility])   . Diverticulosis   . Hypertension   . Seizures ([facility])   . Sleep apnea   . Urinary hesitancy     Past Surgical History:  Procedure Laterality Date  . COLON SURGERY    . HERNIA REPAIR    . UVULOPALATOPHARYNGOPLASTY, TONSILLECTOMY AND SEPTOPLASTY      There were no vitals filed for this visit.   Subjective Assessment - 03/29/20 1549    Subjective "I do think I've improved. Other people have noticed."                 ADULT SLP TREATMENT - 03/29/20 0001      General Information   Behavior/Cognition Alert;Cooperative;Pleasant mood    HPI 69-year-old man diagnosed with Parkinson's disease.  Patient had a course of LSVT-LOUD in 2019, an MBSS 04/05/2019, and an MBS 12/15/2019.       Treatment Provided   Treatment provided Cognitive-Linquistic      Pain Assessment   Pain Assessment No/denies pain      Cognitive-Linquistic Treatment   Treatment focused on Voice    Skilled Treatment Daily Task #1 (Maximum sustained "ah"): Average 12 seconds, 87 dB. Daily Task 2 (Maximum fundamental frequency range): Highs: 15 high  pitched "ah" (280 Hz). Lows: 15 low pitched "ah" (105 Hz). Daily task #3 (Maximum speech loudness drill of functional phrases): Average 78 dB.  Hierarchal speech loudness drill: conversational speech: 74 dB.  Homework: given.  Off the cuff remarks: average 70 dB, improves to 74+ dB given mod cues.      Assessment / Recommendations / Plan   Plan Continue with current plan of care      Progression Toward Goals   Progression toward goals Progressing toward goals            SLP Education - 03/29/20 1550    Education Details LSVT-LOUD    Person(s) Educated Patient    Methods Explanation;Demonstration    Comprehension Verbalized understanding;Returned demonstration              SLP Long Term Goals - 03/15/20 0821      SLP LONG TERM GOAL #1   Title The patient will complete Daily Tasks (Maximum duration "ah", High/Lows, and Functional Phrases) at average loudness of 80 dB and with loud, good quality voice.     Status Partially Met    Target Date 03/30/20      SLP LONG TERM GOAL #2     Title The patient will complete Hierarchal Speech Loudness reading drills (words/phrases, sentences, and paragraph) at average 75 dB and with loud, good quality voice.      Status Partially Met    Target Date 03/30/20      SLP LONG TERM GOAL #3   Title The patient will participate in conversation, maintaining average loudness of 75 dB and loud, good quality voice.    Status Partially Met    Target Date 03/30/20      SLP LONG TERM GOAL #4   Title The patient will complete homework daily.    Status On-going    Target Date 03/30/20            Plan - 03/29/20 1550    Clinical Impression Statement The patient is completing daily tasks and hierarchal speech drill tasks with loud, good quality voice given moderate SLP cues to slow speech sufficiently to be loud for all words. Requires cueing to maintain loud speech during conversational speech.    Speech Therapy Frequency 4x / week    Duration 4  weeks    Treatment/Interventions Pharyngeal strengthening exercises;SLP instruction and feedback;Patient/family education;Other (comment)    Potential to Achieve Goals Good    Potential Considerations Ability to learn/carryover information;Previous level of function;Co-morbidities;Severity of impairments;Cooperation/participation level;Medical prognosis;Family/community support    SLP Home Exercise Plan LSVT-LOUD daily homework    Consulted and Agree with Plan of Care Patient           Patient will benefit from skilled therapeutic intervention in order to improve the following deficits and impairments:   Dysphonia    Problem List Patient Active Problem List   Diagnosis Date Noted  . Adenoma of large intestine 02/01/2016  . Clinical depression 02/01/2016  . 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 Benign essential HTN 02/01/2016  . 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/2016  . Pure hypercholesterolemia 02/01/2016  . Type 2 diabetes mellitus ([place]) 01/10/2016  . Microalbuminuria 01/10/2016  . Degeneration of intervertebral disc of lumbar region 07/13/2015  . Parkinson's disease ([place]) 11/16/2012    Randall [name], Randall [name] 03/29/2020, 3:51 PM  [place] MAIN [facility] SERVICES [address] [place], [place], [address] Phone: [phone]   Fax:  [phone]   Name: Randall [name] MRN: [id number] Date of Birth: Oct 14, 1950

## 2020-03-30 ENCOUNTER — Encounter: Payer: Self-pay | Admitting: Speech Pathology

## 2020-03-30 ENCOUNTER — Other Ambulatory Visit: Payer: Self-pay

## 2020-03-30 ENCOUNTER — Ambulatory Visit: Payer: PPO | Admitting: Speech Pathology

## 2020-03-30 DIAGNOSIS — R490 Dysphonia: Secondary | ICD-10-CM

## 2020-03-30 NOTE — Therapy (Signed)
[place] MAIN [facility] SERVICES [address] [place], [place], [address] Phone: [phone]   Fax:  [phone]  Speech Language Pathology Treatment/Discharge Summary  Patient Details  Name: Randall [name] MRN: [id number] Date of Birth: 02-07-1951 Referring Provider (SLP): Dr. [name]   Encounter Date: 03/30/2020   End of Session - 03/30/20 1659    Visit Number 16    Number of Visits 17    Date for SLP Re-Evaluation 03/30/20    Authorization Type Medicare    Authorization Time Period Start 02/21/2020    Authorization - Visit Number 6    Progress Note Due on Visit 10    SLP Start Time 1600    SLP Stop Time  1655    SLP Time Calculation (min) 55 min    Activity Tolerance Patient tolerated treatment well           Past Medical History:  Diagnosis Date  . Allergic   . Colon adenoma   . Depression   . Diabetes mellitus, type II ([place])   . Diverticulosis   . Hypertension   . Seizures ([place])   . Sleep apnea   . Urinary hesitancy     Past Surgical History:  Procedure Laterality Date  . COLON SURGERY    . HERNIA REPAIR    . UVULOPALATOPHARYNGOPLASTY, TONSILLECTOMY AND SEPTOPLASTY      There were no vitals filed for this visit.   Subjective Assessment - 03/30/20 1658    Subjective "I do think I've improved. Other people have noticed."                 ADULT SLP TREATMENT - 03/30/20 0001      General Information   Behavior/Cognition Alert;Cooperative;Pleasant mood    HPI 69 year old man diagnosed with Parkinson's disease.  Patient had a course of LSVT-LOUD in 2019, an MBSS 04/05/2019, and an MBS 12/15/2019.       Treatment Provided   Treatment provided Cognitive-Linquistic      Pain Assessment   Pain Assessment No/denies pain      Cognitive-Linquistic Treatment   Treatment focused on Voice    Skilled Treatment Daily Task #1 (Maximum sustained "ah"): Average 13 seconds, 85 dB. Daily Task 2 (Maximum fundamental frequency  range): Highs: 15 high pitched "ah" (320 Hz). Lows: 15 low pitched "ah" (120 Hz). Daily task #3 (Maximum speech loudness drill of functional phrases): Average 75 dB.  Hierarchal speech loudness drill: Conversation, 75 dB.  Homework: given.  Off the cuff remarks: average 72 dB, improves to 75+ dB given mod cues.        Assessment / Recommendations / Plan   Plan Discharge SLP treatment due to (comment);All goals met      Progression Toward Goals   Progression toward goals Goals met, education completed, patient discharged from [place] Education - 03/30/20 1659    Education Details LSVT-LOUD Forever Homework    Person(s) Educated Patient    Methods Explanation    Comprehension Verbalized understanding              SLP Long Term Goals - 03/30/20 1701      SLP LONG TERM GOAL #1   Title The patient will complete Daily Tasks (Maximum duration "ah", High/Lows, and Functional Phrases) at average loudness of 80 dB and with loud, good quality voice.     Status Achieved      SLP LONG TERM  GOAL #2   Title The patient will complete Hierarchal Speech Loudness reading drills (words/phrases, sentences, and paragraph) at average 75 dB and with loud, good quality voice.      Status Achieved      SLP LONG TERM GOAL #3   Title The patient will participate in conversation, maintaining average loudness of 75 dB and loud, good quality voice.    Status Achieved      SLP LONG TERM GOAL #4   Title The patient will complete homework daily.    Status Achieved            Plan - 03/30/20 1700    Clinical Impression Statement The patient has completed the LSVT-LOUD program and has met his goals. He is completing daily tasks and hierarchal speech drill tasks with loud, good quality voice given occasional SLP cues to slow speech sufficiently to be loud for all words and avoid tense productions (growling).  He has generalization into conversation minimal cues.    Speech Therapy Frequency  Other (comment)   Discharge   Treatment/Interventions Pharyngeal strengthening exercises;SLP instruction and feedback;Patient/family education;Other (comment)    Potential to Achieve Goals Good    Potential Considerations Ability to learn/carryover information;Previous level of function;Co-morbidities;Severity of impairments;Cooperation/participation level;Medical prognosis;Family/community support    SLP Home Exercise Plan LSVT-LOUD forever homework    Consulted and Agree with Plan of Care Patient           Patient will benefit from skilled therapeutic intervention in order to improve the following deficits and impairments:   Dysphonia    Problem List Patient Active Problem List   Diagnosis Date Noted  . Adenoma of large intestine 02/01/2016  . Clinical depression 02/01/2016  . 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 Benign essential HTN 02/01/2016  . 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/2016  . Pure hypercholesterolemia 02/01/2016  . Type 2 diabetes mellitus ([place]) 01/10/2016  . Microalbuminuria 01/10/2016  . Degeneration of intervertebral disc of lumbar region 07/13/2015  . Parkinson's disease ([place]) 11/16/2012   [name], MS/CCC- SLP  [name] 03/30/2020, [place] MAIN [facility] SERVICES [address] [place], [place], [address] Phone: [phone]   Fax:  [phone]   Name: Randall [name] MRN: [id number] Date of Birth: Aug 29, 1951

## 2020-04-05 ENCOUNTER — Other Ambulatory Visit: Payer: Self-pay

## 2020-04-05 ENCOUNTER — Other Ambulatory Visit: Payer: PPO

## 2020-04-05 NOTE — Progress Notes (Signed)
PATIENT NAME: KATHLEEN LIKINS DOB: Mar 05, 1951 MRN: [id number]  PRIMARY CARE PROVIDER: Adin Hector, MD  RESPONSIBLE PARTY:  Acct ID - Guarantor Home Phone Work Phone Relationship Acct Type  [id number] - Dais,Dessie B [phone]  Self P/F     [place],  [address]    PLAN OF CARE and INTERVENTIONS:               1.  GOALS OF CARE/ ADVANCE CARE PLANNING:  Remain in home wife wife and remain independent.                  2.  PATIENT/CAREGIVER EDUCATION:  Education on fall precautions, education on s/s of infection, reviewed meds, support               .  DISEASE STATUS:RN made scheduled palliative care home visit. Nurse met with patient and his wife Melba. Wife reports patient had infection in a tooth on left upper side (#13) of mouth. Oral surgeon extracted patients tooth 2.5 weeks ago. Patient and wife report patient did not receive antibiotics prior to being pulled. Wife reports Dr. [name] assessed area where tooth was extracted when patient at visit.  Dr [name] informed patient he felt he had a hematoma at extraction site. Nurse looked at patients mouth where tooth was extracted and area is healing well. Patient reports he did have some pain after tooth was removed. Patient was given hydrocodone acetaminophen 5/325 for pain.  Patient developed reddened area on face and oral surgeon gave patient antibiotic and mouth rinse.  Wife reports patient took 0.5 tablet before going to bed but has not used pain medication frequently. Patient denies having any pain at the present time. Patient has not suffered any falls since nurse made last palliative visit. Patient has finished with his outpatient therapy. Dr [name] made recommendation for patient to have walker in the home due to gait imbalance. Patience has gained 3 lbs current weight  Is 183 lbs. Patient reports his appetite is adequate. Patients vital signs are stable. Patient taking Merlene Laughter  as needed and wife reports patient took this am. Patient  states he feels his shaking is better after taking medication. Patients breath sounds are diminished in the bases. Patient denies having any shortness of breath or cough. Patient reports he has been sleeping well. Patient no longer taking melatonin at bedtime but takes Mirtazapine for sleep. Nurse reviewed patient's medications with patient and wife. Patient and wife remain in agreement with palliative care services. Patient and wife encouraged to contact palliative care with questions or concerns.      HISTORY OF PRESENT ILLNESS: Patient is a 69 year old male who resides in home with wife.  Patient is followed by palliative care and is seen monthly and PRN.    CODE STATUS: Full Code   ADVANCED DIRECTIVES: Y Living will MOST FORM: Yes PPS: 60%   PHYSICAL EXAM:   VITALS: Today's Vitals   04/05/20 1447  Weight: 183 lb (83 kg)  Height: '5\' 8"'$  (1.727 m)  PainSc: 0-No pain    LUNGS: decreased breath sounds bases bilaterally CARDIAC: Cor RRR  EXTREMITIES: Trace edema right greater than the left SKIN: no visible open areas of skin breakdown  NEURO: positive for coordination problems, gait problems, memory problems and tremors       Nilda Simmer, RN

## 2020-05-04 ENCOUNTER — Ambulatory Visit: Payer: PPO | Admitting: Dermatology

## 2020-05-04 ENCOUNTER — Other Ambulatory Visit: Payer: Self-pay

## 2020-05-04 DIAGNOSIS — L720 Epidermal cyst: Secondary | ICD-10-CM

## 2020-05-04 DIAGNOSIS — L570 Actinic keratosis: Secondary | ICD-10-CM | POA: Diagnosis not present

## 2020-05-04 DIAGNOSIS — L578 Other skin changes due to chronic exposure to nonionizing radiation: Secondary | ICD-10-CM | POA: Diagnosis not present

## 2020-05-04 NOTE — Progress Notes (Signed)
   Follow-Up Visit   Subjective  Randall [name] is a 69 y.o. male who presents for the following: Follow-up (hx of AKs on scalp and L temple).  The following portions of the chart were reviewed this encounter and updated as appropriate: Tobacco  Allergies  Meds  Problems  Med Hx  Surg Hx  Fam Hx     Review of Systems: No other skin or systemic complaints except as noted in HPI or Assessment and Plan.   Objective  Well appearing patient in no apparent distress; mood and affect are within normal limits.  A focused examination was performed including face and scalp. Relevant physical exam findings are noted in the Assessment and Plan.  Objective  Scalp x 5 (5): Rough, scaly patch  Objective  forehead: Smooth white papule(s).   Assessment & Plan  Actinic keratosis (5) Scalp x 5  Destruction of lesion - Scalp x 5 Complexity: simple   Destruction method: cryotherapy   Informed consent: discussed and consent obtained   Timeout:  patient name, date of birth, surgical site, and procedure verified Lesion destroyed using liquid nitrogen: Yes   Region frozen until ice ball extended beyond lesion: Yes   Outcome: patient tolerated procedure well with no complications   Post-procedure details: wound care instructions given    [place]ia forehead  The patient will observe these symptoms, and report promptly any worsening or unexpected persistence.  If well, may return prn.   Actinic Damage - diffuse scaly erythematous macules with underlying dyspigmentation - Recommend daily broad spectrum sunscreen SPF 30+ to sun-exposed areas, reapply every 2 hours as needed.  - Call for new or changing lesions.  Return in about 8 months (around 01/02/2021) for AK follow up.   IHarriett Sine, CMA, am acting as scribe for Sarina Ser, MD.  Documentation: I have reviewed the above documentation for accuracy and completeness, and I agree with the above.  Sarina Ser, MD

## 2020-05-04 NOTE — Patient Instructions (Signed)

## 2020-05-12 ENCOUNTER — Encounter: Payer: Self-pay | Admitting: Dermatology

## 2020-05-23 DIAGNOSIS — R350 Frequency of micturition: Secondary | ICD-10-CM | POA: Diagnosis not present

## 2020-05-23 DIAGNOSIS — I10 Essential (primary) hypertension: Secondary | ICD-10-CM | POA: Diagnosis not present

## 2020-05-23 DIAGNOSIS — E118 Type 2 diabetes mellitus with unspecified complications: Secondary | ICD-10-CM | POA: Diagnosis not present

## 2020-05-23 DIAGNOSIS — M5136 Other intervertebral disc degeneration, lumbar region: Secondary | ICD-10-CM | POA: Diagnosis not present

## 2020-05-23 DIAGNOSIS — R358 Other polyuria: Secondary | ICD-10-CM | POA: Diagnosis not present

## 2020-05-23 DIAGNOSIS — E7800 Pure hypercholesterolemia, unspecified: Secondary | ICD-10-CM | POA: Diagnosis not present

## 2020-05-24 DIAGNOSIS — H2513 Age-related nuclear cataract, bilateral: Secondary | ICD-10-CM | POA: Diagnosis not present

## 2020-05-30 DIAGNOSIS — G20 Parkinson's disease: Secondary | ICD-10-CM | POA: Diagnosis not present

## 2020-05-30 DIAGNOSIS — Z23 Encounter for immunization: Secondary | ICD-10-CM | POA: Diagnosis not present

## 2020-05-30 DIAGNOSIS — Z125 Encounter for screening for malignant neoplasm of prostate: Secondary | ICD-10-CM | POA: Diagnosis not present

## 2020-05-30 DIAGNOSIS — F3340 Major depressive disorder, recurrent, in remission, unspecified: Secondary | ICD-10-CM | POA: Diagnosis not present

## 2020-05-30 DIAGNOSIS — R809 Proteinuria, unspecified: Secondary | ICD-10-CM | POA: Diagnosis not present

## 2020-05-30 DIAGNOSIS — E1169 Type 2 diabetes mellitus with other specified complication: Secondary | ICD-10-CM | POA: Diagnosis not present

## 2020-05-30 DIAGNOSIS — M5136 Other intervertebral disc degeneration, lumbar region: Secondary | ICD-10-CM | POA: Diagnosis not present

## 2020-05-30 DIAGNOSIS — R350 Frequency of micturition: Secondary | ICD-10-CM | POA: Diagnosis not present

## 2020-05-30 DIAGNOSIS — I10 Essential (primary) hypertension: Secondary | ICD-10-CM | POA: Diagnosis not present

## 2020-05-30 DIAGNOSIS — R358 Other polyuria: Secondary | ICD-10-CM | POA: Diagnosis not present

## 2020-05-30 DIAGNOSIS — E118 Type 2 diabetes mellitus with unspecified complications: Secondary | ICD-10-CM | POA: Diagnosis not present

## 2020-05-30 DIAGNOSIS — G4710 Hypersomnia, unspecified: Secondary | ICD-10-CM | POA: Diagnosis not present

## 2020-05-30 DIAGNOSIS — E7800 Pure hypercholesterolemia, unspecified: Secondary | ICD-10-CM | POA: Diagnosis not present

## 2020-06-07 ENCOUNTER — Telehealth: Payer: Self-pay

## 2020-06-07 NOTE — Telephone Encounter (Signed)
215 pm.  Palliative Care RN outreach call made to check on patient status and new concerns.  No answer but I have left a message on the home phone requesting a call back.  PLAN:  Awaiting call back.  I will contact patient again next month if I do not receive a call back.

## 2020-06-12 ENCOUNTER — Ambulatory Visit: Payer: PPO | Admitting: Physical Therapy

## 2020-06-13 ENCOUNTER — Other Ambulatory Visit: Payer: Self-pay

## 2020-06-13 ENCOUNTER — Ambulatory Visit: Payer: PPO | Attending: Neurology | Admitting: Physical Therapy

## 2020-06-13 ENCOUNTER — Encounter: Payer: Self-pay | Admitting: Physical Therapy

## 2020-06-13 DIAGNOSIS — R262 Difficulty in walking, not elsewhere classified: Secondary | ICD-10-CM | POA: Diagnosis not present

## 2020-06-13 DIAGNOSIS — M6281 Muscle weakness (generalized): Secondary | ICD-10-CM | POA: Insufficient documentation

## 2020-06-13 NOTE — Therapy (Addendum)
[place] MAIN [facility] SERVICES [address] [place], [place], [address] Phone: [phone]   Fax:  [phone]  Physical Therapy Evaluation  Patient Details  Name: Randall [name] MRN: [id number] Date of Birth: 10/16/1950 Referring Provider (PT): Jennings Books   Encounter Date: 06/13/2020   PT End of Session - 06/13/20 1408    Visit Number 1    Number of Visits 17    Date for PT Re-Evaluation 07/25/20    PT Start Time 0200    PT Stop Time 0245    PT Time Calculation (min) 45 min    Equipment Utilized During Treatment Gait belt    Activity Tolerance Patient tolerated treatment well    Behavior During Therapy WFL for tasks assessed/performed           Past Medical History:  Diagnosis Date  . Allergic   . Colon adenoma   . Depression   . Diabetes mellitus, type II ([place])   . Diverticulosis   . Hypertension   . Seizures ([place])   . Sleep apnea   . Urinary hesitancy     Past Surgical History:  Procedure Laterality Date  . COLON SURGERY    . HERNIA REPAIR    . UVULOPALATOPHARYNGOPLASTY, TONSILLECTOMY AND SEPTOPLASTY      There were no vitals filed for this visit.        [facility] PT Assessment - 06/13/20 0001      Assessment   Medical Diagnosis Parkinsons Disease    Referring Provider (PT) shah, Hemang    Hand Dominance Right    Prior Therapy 2 years ago      Precautions   Precautions Fall      Restrictions   Weight Bearing Restrictions No      Balance Screen   Has the patient fallen in the past 6 months Yes    How many times? 3    Has the patient had a decrease in activity level because of a fear of falling?  Yes    Is the patient reluctant to leave their home because of a fear of falling?  No      Home Ecologist residence    Living Arrangements Spouse/significant other    Available Help at Discharge Family    Type of [place] to enter    Entrance Stairs-Number  of Steps 3    [place] One level    [place] - 4 wheels;[name] - single point      Prior Function   Level of Independence Independent with household mobility with device    Vocation Retired    Leisure sing      Cognition   Overall Cognitive Status Within Functional Limits for tasks assessed      Standardized Balance Assessment   Standardized Balance Assessment Chief Technology Officer Test   Sit to Stand Able to stand without using hands and stabilize independently    Standing Unsupported Able to stand safely 2 minutes    Sitting with Back Unsupported but Feet Supported on Floor or Stool Able to sit safely and securely 2 minutes    Stand to Sit Sits safely with minimal use of hands    Transfers Able to transfer safely, minor use of hands    Standing Unsupported with Eyes Closed Able to stand 10 seconds safely  Standing Unsupported with Feet Together Able to place feet together independently and stand 1 minute safely    From Standing, Reach Forward with Outstretched Arm Can reach forward >5 cm safely (2")    From Standing Position, Pick up Object from Floor Able to pick up shoe safely and easily    From Standing Position, Turn to Look Behind Over each Shoulder Looks behind from both sides and weight shifts well    Turn 360 Degrees Able to turn 360 degrees safely one side only in 4 seconds or less    Standing Unsupported, Alternately Place Feet on Step/Stool Able to stand independently and complete 8 steps >20 seconds    Standing Unsupported, One Foot in [employer] balance while stepping or standing    Standing on One Leg Unable to try or needs assist to prevent fall    Total Score 44             PAIN: no pain POSTURE: trunk flex with standing  PROM/AROM: PROM BUE: WFL AROM BUE WFL PROM BLE WFL AROM BLE: WFL  STRENGTH:  Graded on a 0-5 scale Muscle Group Left Right                          Hip Flex 4/5 4/5   Hip Abd 4/5 4/5              Knee Flex 4/5 4/5  Knee Ext 4/5 4/5  Ankle DF 4/5 4/5  Ankle PF 4/5 4/5   SENSATION:  BUE : WFL BLE : WFL  NEUROLOGICAL SCREEN: (2+ unless otherwise noted.) N=normal  Ab=abnormal   Level Dermatome R L  C3 Anterior Neck  N N  C4 Top of Shoulder N N  C5 Lateral Upper Arm  N N  C6 Lateral Arm/ Thumb  N N  C7 Middle Finger  N N  C8 4th & 5th Finger N N  T1 Medial Arm N N  L2 Medial thigh/groin N N  L3 Lower thigh/med.knee N N  L4 Medial leg/lat thigh N N  L5 Lat. leg & dorsal foot N N  S1 post/lat foot/thigh/leg N N  S2 Post./med. thigh & leg N N    SOMATOSENSORY:  Any N & T in extremities or weakness: reports :         Sensation           Intact      Diminished         Absent  Light touch LEs                              COORDINATION: Finger to Nose: Dysmetric and slow speed   FUNCTIONAL MOBILITY: independent with transfers and bed mobility   BALANCE:Berb balance 44/56   GAIT: Patient has shuffling gait with narrow base of support and decreased arm swing  OUTCOME MEASURES: TEST Outcome Interpretation  5 times sit<>stand 14.55sec >70 yo, >15 sec indicates increased risk for falls  10 meter walk test 1.18                m/s <1.0 m/s indicates increased risk for falls; limited community ambulator  Timed up and Go     8.44, cog 11.44 carry 12.18            sec <14 sec indicates increased risk for falls  6 minute walk test   1300  Feet 1000 feet is community Water quality scientist 44/56 <36/56 (100% risk for falls), 37-45 (80% risk for falls); 46-51 (>50% risk for falls); 52-55 (lower risk <25% of falls)  9 Hole Peg Test L:   40.95             R: 36.74 Decrease by 6 points is significant              Objective measurements completed on examination: See above findings.               PT Education - 06/13/20 1407    Education Details plan of care    Person(s) Educated Patient    Methods  Explanation    Comprehension Verbalized understanding            PT Short Term Goals - 06/13/20 1509      PT SHORT TERM GOAL #1   Title Patient will be modified independent in home exercise program to improve strength/mobility for better functional independence with ADLs.    Time 2    Period Weeks    Status New             PT Long Term Goals - 06/13/20 1505      PT LONG TERM GOAL #1   Title Patient will reduce timed up and go to <11 seconds to reduce fall risk and demonstrate improved transfer/gait ability.    Time 4    Period Weeks    Status New    Target Date 07/11/20      PT LONG TERM GOAL #2   Title Patient will be independent in home exercise program to improve strength/mobility for better functional independence with ADLs.    Time 4    Period Weeks    Status New    Target Date 07/11/20      PT LONG TERM GOAL #3   Title Patient will increase Berg Balance score by > 6 points to demonstrate decreased fall risk during functional activities.    Time 4    Period Weeks    Status New    Target Date 07/11/20      PT LONG TERM GOAL #4   Title Patient will improve FOTO score to indicate improved functional mobility and safety    Time 4    Period Weeks    Status New    Target Date 07/11/20                  Plan - 06/13/20 1409    Clinical Impression Statement Patient presents with decreased gait speed with shuffling gait, decreased balance, and decreased BLE strength. Patient's main complaint is BLE weakness and inability to participate in desired activities. Patient wants to improve his balance and ability to ambulate  safely. Patient will benefit from skilled PT in order to increase gait speed, increase BLE strength, and improve dynamic standing balance to decrease risk for falls and enable patient to participate in desired activities.   Stability/Clinical Decision Making Stable/Uncomplicated    Clinical Decision Making Low    Rehab Potential Good    PT  Frequency 4x / week    PT Duration 4 weeks    PT Treatment/Interventions Therapeutic activities;Neuromuscular re-education;Balance training;Therapeutic exercise;Patient/family education;Gait training    PT Next Visit Plan LSVT BIG    Consulted and Agree with Plan of Care Patient           Patient will benefit from skilled therapeutic intervention in order to improve  the following deficits and impairments:  Abnormal gait, Impaired flexibility, Difficulty walking, Decreased mobility, Decreased balance, Decreased coordination, Decreased activity tolerance, Decreased endurance, Decreased strength  Visit Diagnosis: Difficulty in walking, not elsewhere classified  Muscle weakness (generalized)     Problem List Patient Active Problem List   Diagnosis Date Noted  . Adenoma of large intestine 02/01/2016  . Clinical depression 02/01/2016  . 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 Benign essential HTN 02/01/2016  . 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/2016  . Pure hypercholesterolemia 02/01/2016  . Type 2 diabetes mellitus ([place]) 01/10/2016  . Microalbuminuria 01/10/2016  . Degeneration of intervertebral disc of lumbar region 07/13/2015  . Parkinson's disease ([place]) 11/16/2012    Alanson Puls, PT DPT 06/13/2020, 3:12 PM  [place] MAIN [facility] SERVICES [address] [place], [place], [address] Phone: [phone]   Fax:  [phone]  Name: SABER DICKERMAN MRN: [id number] Date of Birth: Jul 23, 1951

## 2020-06-15 DIAGNOSIS — E1142 Type 2 diabetes mellitus with diabetic polyneuropathy: Secondary | ICD-10-CM | POA: Diagnosis not present

## 2020-06-15 DIAGNOSIS — B351 Tinea unguium: Secondary | ICD-10-CM | POA: Diagnosis not present

## 2020-06-15 DIAGNOSIS — L851 Acquired keratosis [keratoderma] palmaris et plantaris: Secondary | ICD-10-CM | POA: Diagnosis not present

## 2020-06-19 ENCOUNTER — Other Ambulatory Visit: Payer: Self-pay

## 2020-06-19 ENCOUNTER — Ambulatory Visit: Payer: PPO | Admitting: Physical Therapy

## 2020-06-19 ENCOUNTER — Encounter: Payer: Self-pay | Admitting: Physical Therapy

## 2020-06-19 DIAGNOSIS — R262 Difficulty in walking, not elsewhere classified: Secondary | ICD-10-CM

## 2020-06-19 DIAGNOSIS — M6281 Muscle weakness (generalized): Secondary | ICD-10-CM

## 2020-06-19 NOTE — Therapy (Signed)
[place] MAIN [facility] SERVICES [address] [place], [place], [address] Phone: [phone]   Fax:  [phone]  Physical Therapy Treatment  Patient Details  Name: Randall [name] MRN: [id number] Date of Birth: March 02, 1951 Referring Provider (PT): Jennings Books   Encounter Date: 06/19/2020   PT End of Session - 06/19/20 1411    Visit Number 2    Number of Visits 17    Date for PT Re-Evaluation 07/25/20    PT Start Time 0157    PT Stop Time 0255    PT Time Calculation (min) 58 min    Equipment Utilized During Treatment Gait belt    Activity Tolerance Patient tolerated treatment well    Behavior During Therapy WFL for tasks assessed/performed           Past Medical History:  Diagnosis Date   Allergic    Colon adenoma    Depression    Diabetes mellitus, type II ([place])    Diverticulosis    Hypertension    Seizures ([place])    Sleep apnea    Urinary hesitancy     Past Surgical History:  Procedure Laterality Date   COLON SURGERY     HERNIA REPAIR     UVULOPALATOPHARYNGOPLASTY, TONSILLECTOMY AND SEPTOPLASTY      There were no vitals filed for this visit.   Subjective Assessment - 06/19/20 1404    Subjective Patient is having knee pain left side > right sied.    Pertinent History Patient has had parkinsons disease 10-12 years. He had LSVT BIG treatment several years ago and now is having decreased balance and some falls. He is going to be using a RW for ambulation.    Currently in Pain? Yes    Pain Score 3     Pain Location Knee    Pain Descriptors / Indicators Aching            Treatment:   Patient seen for LSVT Daily Session Maximal Daily Exercises for facilitation/coordination of movement Sustained movements are designed to rescale the amplitude of movement output for generalization to daily functional activities .Performed as follows for 1 set of 10 repetitions each multidirectional sustained movements  1) Floor to  ceiling , cues to hold for 10 seconds,needs modeling for correct positions , VC / TCto reach out further and to reach up to the ceiling 2) Side to side multidirectional Repetitive movements performed in sitting and are designed to provide retraining effort needed for sustained muscle activation in tasks , cues to lean fwd and hold position x 10 counts: Patient does not move fwd<>sidesitting<> to fwd and is not able to transition or stretch out leg very far 3) Step and reach forward , cues for good knee flex, cues to move UE and LE together, cues to rotate  arms up to pronate his forearms 4) Step and reach backwards , cues for BUE back, getting toe up and bending back knee 5) Step and reach sideways, cues to turn  head sideways, and turn head back to neutral, and to rotate arms and pronate forearms 6) Rock and reach forward/backward , cues to reach far fwd with UE's, patient not comfortable with feet apart , not rocking very far to get a good weight shift, not able to raise  heel or raise  toes much 7) Rock and reach sideways, cues for twist and look behind , only rotates side ways, unable to twist around or turn to look behind  functional component task with supervision 5 reps and simulated activities for: 1. Sit to stand x 10   needs cues to begin with UE up for correct start position 2- 5 not done today due to time constraing Patient performed with instruction, verbal cues, tactile cues of therapist: goal: increase tissue extensibility, promote proper posture, improve mobility  Pt educated throughout session about proper posture and technique with exercises. Improved exercise technique, movement at target joints, use of target muscles after min to mod verbal, visual, tactile cues.                           PT Education - 06/19/20 1410    Education Details LSVT BIG    Person(s) Educated Patient    Methods Explanation    Comprehension Verbalized understanding;Tactile cues  required;Returned demonstration;Verbal cues required;Need further instruction            PT Short Term Goals - 06/13/20 1509      PT SHORT TERM GOAL #1   Title Patient will be modified independent in home exercise program to improve strength/mobility for better functional independence with ADLs.    Time 2    Period Weeks    Status New    Target Date 06/27/20             PT Long Term Goals - 06/13/20 1505      PT LONG TERM GOAL #1   Title Patient will reduce timed up and go to <11 seconds to reduce fall risk and demonstrate improved transfer/gait ability.    Time 4    Period Weeks    Status New    Target Date 07/11/20      PT LONG TERM GOAL #2   Title Patient will be independent in home exercise program to improve strength/mobility for better functional independence with ADLs.    Time 4    Period Weeks    Status New    Target Date 07/11/20      PT LONG TERM GOAL #3   Title Patient will increase Berg Balance score by > 6 points to demonstrate decreased fall risk during functional activities.    Time 4    Period Weeks    Status New    Target Date 07/11/20      PT LONG TERM GOAL #4   Title Patient will improve FOTO score to indicate improved functional mobility and safety    Time 4    Period Weeks    Status New    Target Date 07/11/20                 Plan - 06/19/20 1412    Clinical Impression Statement Max cueing needed to appropriately perform LSVT tasks with leg, hand, and head position. Decreased coordination demonstrated requiring consistent verbal cueing to correct form. Cognitive understanding of task was delayed. Patient continues to demonstrate some in coordination of movement with select exercises such as rock and reach and stepping backwards. Patient responds well to verbal and tactile cues to correct form and technique.  CGA to SBA for safety with activities.  Uses to increase intensity and amplitude of movements throughout session     Stability/Clinical Decision Making Stable/Uncomplicated    Rehab Potential Good    PT Frequency 4x / week    PT Duration 4 weeks    PT Treatment/Interventions Therapeutic activities;Neuromuscular re-education;Balance training;Therapeutic exercise;Patient/family education;Gait training    PT Next Visit Plan LSVT  BIG    Consulted and Agree with Plan of Care Patient           Patient will benefit from skilled therapeutic intervention in order to improve the following deficits and impairments:  Abnormal gait, Impaired flexibility, Difficulty walking, Decreased mobility, Decreased balance, Decreased coordination, Decreased activity tolerance, Decreased endurance, Decreased strength  Visit Diagnosis: Difficulty in walking, not elsewhere classified  Muscle weakness (generalized)     Problem List Patient Active Problem List   Diagnosis Date Noted   Adenoma of large intestine 02/01/2016   Clinical depression 02/01/2016   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   Benign essential HTN 02/01/2016   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/2016   Pure hypercholesterolemia 02/01/2016   Type 2 diabetes mellitus ([place]) 01/10/2016   Microalbuminuria 01/10/2016   Degeneration of intervertebral disc of lumbar region 07/13/2015   Parkinson's disease ([place]) 11/16/2012    Alanson Puls, PT DPT 06/19/2020, 2:46 PM  [place] MAIN [facility] SERVICES [address] [place], [place], [address] Phone: [phone]   Fax:  [phone]  Name: Randall [name] MRN: [id number] Date of Birth: July 26, 1951

## 2020-06-20 ENCOUNTER — Encounter: Payer: Self-pay | Admitting: Physical Therapy

## 2020-06-20 ENCOUNTER — Ambulatory Visit: Payer: PPO | Admitting: Physical Therapy

## 2020-06-20 DIAGNOSIS — M6281 Muscle weakness (generalized): Secondary | ICD-10-CM

## 2020-06-20 DIAGNOSIS — R262 Difficulty in walking, not elsewhere classified: Secondary | ICD-10-CM

## 2020-06-20 NOTE — Therapy (Signed)
[place] MAIN [facility] SERVICES [address] [place], [place], [address] Phone: [phone]   Fax:  [phone]  Physical Therapy Treatment  Patient Details  Name: Randall [name] MRN: [id number] Date of Birth: 09/17/1950 Referring Provider (PT): Jennings Books   Encounter Date: 06/20/2020   PT End of Session - 06/20/20 1424    Visit Number 3    Number of Visits 17    Date for PT Re-Evaluation 07/25/20    PT Start Time 0347    PT Stop Time 1515    PT Time Calculation (min) 60 min    Equipment Utilized During Treatment Gait belt    Activity Tolerance Patient tolerated treatment well    Behavior During Therapy WFL for tasks assessed/performed           Past Medical History:  Diagnosis Date  . Allergic   . Colon adenoma   . Depression   . Diabetes mellitus, type II ([place])   . Diverticulosis   . Hypertension   . Seizures ([place])   . Sleep apnea   . Urinary hesitancy     Past Surgical History:  Procedure Laterality Date  . COLON SURGERY    . HERNIA REPAIR    . UVULOPALATOPHARYNGOPLASTY, TONSILLECTOMY AND SEPTOPLASTY      There were no vitals filed for this visit.   Subjective Assessment - 06/20/20 1421    Subjective Patient is having knee pain left side > right sied.    Pertinent History Patient has had parkinsons disease 10-12 years. He had LSVT BIG treatment several years ago and now is having decreased balance and some falls. He is going to be using a RW for ambulation.    Currently in Pain? No/denies    Pain Score 0-No pain            Treatment:   Patient seen for LSVT Daily Session Maximal Daily Exercises for facilitation/coordination of movement Sustained movements are designed to rescale the amplitude of movement output for generalization to daily functional activities .Performed as follows for 1 set of 10 repetitions each multidirectional sustained movements  1) Floor to ceiling , cues when to hold for 10 seconds,needs  modeling for correct positions , VC to reach out further and to reach up to the ceiling 2) Side to side multidirectional Repetitive movements performed in sitting and are designed to provide retraining effort needed for sustained muscle activation in tasks , cues to lean fwd and hold position x 10 counts: Patient does not move fwd<>sidesitting<> to fwd and is not able to transition or stretch out leg very far 3) Step and reach forward , cues for good knee flex, cues to move UE and LE together, cues to rotate  arms up to pronate his forearms 4) Step and reach backwards , cues for BUE back, getting toe up and bending back knee 5) Step and reach sideways, cues to turn  head sideways, and turn head back to neutral, and to rotate arms and pronate forearms 6) Rock and reach forward/backward , cues to reach far fwd with UE's, patient not comfortable with feet apart , not rocking very far to get a good weight shift, not able to raise  heel or raise  toes much, unable to swing arms and rock 7) Rock and reach sideways, cues for twist and look behind , only rotates side ways, unable to twist around or turn to look behind   functional component task with supervision 5 reps and  simulated activities for: 1. Sit to stand x 10   needs cues to begin with UE up for correct start position 2. peg board with 5 finger flick x 5 buttons 3.getting feet on the stool to pick up his feet better to get his feet in the car 4.picking his feet up to clear the floor better 5. Cards and flipping them to improve coordination for activities Patient performed with instruction, verbal cues, tactile cues of therapist: goal: increase tissue extensibility, promote proper posture, improve mobility  Pt educated throughout session about proper posture and technique with exercises. Improved exercise technique, movement at target joints, use of target muscles after min to mod verbal, visual, tactile  cues.                           PT Education - 06/20/20 1421    Education Details LSVT BIG    Person(s) Educated Patient    Methods Explanation    Comprehension Verbalized understanding;Tactile cues required;Returned demonstration;Verbal cues required            PT Short Term Goals - 06/13/20 1509      PT SHORT TERM GOAL #1   Title Patient will be modified independent in home exercise program to improve strength/mobility for better functional independence with ADLs.    Time 2    Period Weeks    Status New    Target Date 06/27/20             PT Long Term Goals - 06/13/20 1505      PT LONG TERM GOAL #1   Title Patient will reduce timed up and go to <11 seconds to reduce fall risk and demonstrate improved transfer/gait ability.    Time 4    Period Weeks    Status New    Target Date 07/11/20      PT LONG TERM GOAL #2   Title Patient will be independent in home exercise program to improve strength/mobility for better functional independence with ADLs.    Time 4    Period Weeks    Status New    Target Date 07/11/20      PT LONG TERM GOAL #3   Title Patient will increase Berg Balance score by > 6 points to demonstrate decreased fall risk during functional activities.    Time 4    Period Weeks    Status New    Target Date 07/11/20      PT LONG TERM GOAL #4   Title Patient will improve FOTO score to indicate improved functional mobility and safety    Time 4    Period Weeks    Status New    Target Date 07/11/20                 Plan - 06/20/20 1424    Clinical Impression Statement Pt presents with slowness of movements and fatigues with therapeutic exercises. Patient needs verbal, tactile and modeling cues with LSVT BIG exercises. Patient demonstrates difficulty with remembering to hold the seated exercises and the start and finish positions. Patient tolerated all interventions well this date and will benefit from continued skilled PT  interventions to improve strength and balance and decrease risk of falling   Stability/Clinical Decision Making Stable/Uncomplicated    Rehab Potential Good    PT Frequency 4x / week    PT Duration 4 weeks    PT Treatment/Interventions Therapeutic activities;Neuromuscular re-education;Balance training;Therapeutic exercise;Patient/family education;Gait training  PT Next Visit Plan LSVT BIG    Consulted and Agree with Plan of Care Patient           Patient will benefit from skilled therapeutic intervention in order to improve the following deficits and impairments:  Abnormal gait, Impaired flexibility, Difficulty walking, Decreased mobility, Decreased balance, Decreased coordination, Decreased activity tolerance, Decreased endurance, Decreased strength  Visit Diagnosis: Difficulty in walking, not elsewhere classified  Muscle weakness (generalized)     Problem List Patient Active Problem List   Diagnosis Date Noted  . Adenoma of large intestine 02/01/2016  . Clinical depression 02/01/2016  . 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 Benign essential HTN 02/01/2016  . 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/2016  . Pure hypercholesterolemia 02/01/2016  . Type 2 diabetes mellitus ([place]) 01/10/2016  . Microalbuminuria 01/10/2016  . Degeneration of intervertebral disc of lumbar region 07/13/2015  . Parkinson's disease ([place]) 11/16/2012    Alanson Puls, PT DPT 06/20/2020, 2:33 PM  [place] MAIN [facility] SERVICES [address] [place], [place], [address] Phone: [phone]   Fax:  [phone]  Name: COURTENAY CREGER MRN: [id number] Date of Birth: 01-Oct-1950

## 2020-06-21 ENCOUNTER — Ambulatory Visit: Payer: PPO | Admitting: Physical Therapy

## 2020-06-21 ENCOUNTER — Encounter: Payer: Self-pay | Admitting: Physical Therapy

## 2020-06-21 ENCOUNTER — Other Ambulatory Visit: Payer: Self-pay

## 2020-06-21 DIAGNOSIS — R262 Difficulty in walking, not elsewhere classified: Secondary | ICD-10-CM

## 2020-06-21 DIAGNOSIS — M6281 Muscle weakness (generalized): Secondary | ICD-10-CM

## 2020-06-21 NOTE — Therapy (Signed)
[place] MAIN [facility] SERVICES [address] [place], [place], [address] Phone: [phone]   Fax:  [phone]  Physical Therapy Treatment  Patient Details  Name: Randall [name] MRN: [id number] Date of Birth: Jan 12, 1951 Referring Provider (PT): Jennings Books   Encounter Date: 06/21/2020   PT End of Session - 06/21/20 1406    Visit Number 4    Number of Visits 17    Date for PT Re-Evaluation 07/25/20    PT Start Time 1400    PT Stop Time 1500    PT Time Calculation (min) 60 min    Equipment Utilized During Treatment Gait belt    Activity Tolerance Patient tolerated treatment well    Behavior During Therapy WFL for tasks assessed/performed           Past Medical History:  Diagnosis Date  . Allergic   . Colon adenoma   . Depression   . Diabetes mellitus, type II ([place])   . Diverticulosis   . Hypertension   . Seizures ([place])   . Sleep apnea   . Urinary hesitancy     Past Surgical History:  Procedure Laterality Date  . COLON SURGERY    . HERNIA REPAIR    . UVULOPALATOPHARYNGOPLASTY, TONSILLECTOMY AND SEPTOPLASTY      There were no vitals filed for this visit.   Subjective Assessment - 06/21/20 1405    Subjective Patient is doing well today .    Pertinent History Patient has had parkinsons disease 10-12 years. He had LSVT BIG treatment several years ago and now is having decreased balance and some falls. He is going to be using a RW for ambulation.    Currently in Pain? No/denies    Pain Score 0-No pain            Treatment:   Patient seen for LSVT Daily Session Maximal Daily Exercises for facilitation/coordination of movement Sustained movements are designed to rescale the amplitude of movement output for generalization to daily functional activities .Performed as follows for 1 set of 10 repetitions each multidirectional sustained movements  1) Floor to ceiling , cues to hold for 10 seconds,needs modeling for correct positions  , VC to reach out further and to reach up to the ceiling 2) Side to side multidirectional Repetitive movements performed in sitting and are designed to provide retraining effort needed for sustained muscle activation in tasks , cues to lean fwd and hold position x 10 counts: Patient does not move fwd<>sidesitting<> to fwd and is not able to transition or stretch out leg very far 3) Step and reach forward , cues for good knee flex, cues to move UE and LE together, cues to rotate  arms up to pronate his forearms 4) Step and reach backwards , cues for BUE back, getting toe up and bending back knee 5) Step and reach sideways, cues to turn  head sideways, and turn head back to neutral, and to rotate arms and pronate forearms 6) Rock and reach forward/backward , cues to reach far fwd with UE's, patient not comfortable with feet apart , not rocking very far to get a good weight shift, not able to raise  heel or raise  toes much 7) Rock and reach sideways, cues for twist and look behind , only rotates side ways, unable to twist around or turn to look behind   functional component task with supervision 5 reps and simulated activities for: 1. Sit to stand x 10  needs cues to begin with UE up for correct start position 2. pegs with 5 finger flick x 5 buttons 3.tapping a 6 inch stool x 20  4 scooting in a chair x 5  5. Getting his feet in the car x 5  Patient performed with instruction, verbal cues, tactile cues of therapist: goal: increase tissue extensibility, promote proper posture, improve mobility  Pt educated throughout session about proper posture and technique with exercises. Improved exercise technique, movement at target joints, use of target muscles after min to mod verbal, visual, tactile cues.                          PT Education - 06/21/20 1406    Education Details LSVT BIG    Person(s) Educated Patient    Methods Explanation    Comprehension Verbalized  understanding;Tactile cues required;Need further instruction;Returned demonstration;Verbal cues required            PT Short Term Goals - 06/13/20 1509      PT SHORT TERM GOAL #1   Title Patient will be modified independent in home exercise program to improve strength/mobility for better functional independence with ADLs.    Time 2    Period Weeks    Status New    Target Date 06/27/20             PT Long Term Goals - 06/13/20 1505      PT LONG TERM GOAL #1   Title Patient will reduce timed up and go to <11 seconds to reduce fall risk and demonstrate improved transfer/gait ability.    Time 4    Period Weeks    Status New    Target Date 07/11/20      PT LONG TERM GOAL #2   Title Patient will be independent in home exercise program to improve strength/mobility for better functional independence with ADLs.    Time 4    Period Weeks    Status New    Target Date 07/11/20      PT LONG TERM GOAL #3   Title Patient will increase Berg Balance score by > 6 points to demonstrate decreased fall risk during functional activities.    Time 4    Period Weeks    Status New    Target Date 07/11/20      PT LONG TERM GOAL #4   Title Patient will improve FOTO score to indicate improved functional mobility and safety    Time 4    Period Weeks    Status New    Target Date 07/11/20                 Plan - 06/21/20 1407    Clinical Impression Statement Continues to have balance deficits typical with diagnosis. Patient performs intermediate level exercises without pain behaviors and needs verbal cuing for postural alignment and head positioning. Cuing is needed to stretch the leg out in seated side reaching. Fatigue with sit to stand but demonstrating more control .    Rehab Potential Good    PT Frequency 4x / week    PT Duration 4 weeks    PT Treatment/Interventions Therapeutic activities;Neuromuscular re-education;Balance training;Therapeutic exercise;Patient/family  education;Gait training    PT Next Visit Plan LSVT BIG    Consulted and Agree with Plan of Care Patient           Patient will benefit from skilled therapeutic intervention in order to improve the following  deficits and impairments:  Abnormal gait, Impaired flexibility, Difficulty walking, Decreased mobility, Decreased balance, Decreased coordination, Decreased activity tolerance, Decreased endurance, Decreased strength  Visit Diagnosis: Muscle weakness (generalized)  Difficulty in walking, not elsewhere classified     Problem List Patient Active Problem List   Diagnosis Date Noted  . Adenoma of large intestine 02/01/2016  . Clinical depression 02/01/2016  . 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 Benign essential HTN 02/01/2016  . 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/2016  . Pure hypercholesterolemia 02/01/2016  . Type 2 diabetes mellitus ([place]) 01/10/2016  . Microalbuminuria 01/10/2016  . Degeneration of intervertebral disc of lumbar region 07/13/2015  . Parkinson's disease ([place]) 11/16/2012    Alanson Puls , PT DPT 06/21/2020, 2:09 PM  [place] MAIN [facility] SERVICES [address] [place], [place], [address] Phone: [phone]   Fax:  [phone]  Name: Randall [name] MRN: [id number] Date of Birth: [birth year]

## 2020-06-22 ENCOUNTER — Ambulatory Visit: Payer: PPO | Admitting: Physical Therapy

## 2020-06-22 DIAGNOSIS — M6281 Muscle weakness (generalized): Secondary | ICD-10-CM

## 2020-06-22 DIAGNOSIS — R262 Difficulty in walking, not elsewhere classified: Secondary | ICD-10-CM

## 2020-06-22 NOTE — Therapy (Signed)
[place] MAIN [facility] SERVICES [address] [place], [place], [address] Phone: [phone]   Fax:  [phone]  Patient Details  Name: Randall [name] MRN: [id number] Date of Birth: 1951-03-24 Referring Provider:  Vladimir Crofts, MD  Encounter Date: 06/22/2020 Patient had a fall today and is not feeling up to his treatment today. He came to therapy but then asked if we could reschedule it due to the fall.   [address] , [place] DPT 06/22/2020, 2:57 PM  [place] MAIN [facility] SERVICES [address] [place], [place], [address] Phone: [phone]   Fax:  [phone]

## 2020-06-26 ENCOUNTER — Other Ambulatory Visit: Payer: Self-pay

## 2020-06-26 ENCOUNTER — Ambulatory Visit: Payer: PPO | Admitting: Physical Therapy

## 2020-06-26 ENCOUNTER — Encounter: Payer: Self-pay | Admitting: Physical Therapy

## 2020-06-26 DIAGNOSIS — R262 Difficulty in walking, not elsewhere classified: Secondary | ICD-10-CM | POA: Diagnosis not present

## 2020-06-26 DIAGNOSIS — M6281 Muscle weakness (generalized): Secondary | ICD-10-CM

## 2020-06-26 NOTE — Therapy (Signed)
[place] MAIN [facility] SERVICES [address] [place], [place], [address] Phone: [phone]   Fax:  [phone]  Physical Therapy Treatment  Patient Details  Name: Randall [name] MRN: [id number] Date of Birth: July 29, 1951 Referring Provider (PT): Jennings Books   Encounter Date: 06/26/2020   PT End of Session - 06/26/20 1407    Visit Number 5    Number of Visits 17    Date for PT Re-Evaluation 07/25/20    PT Start Time 1400    PT Stop Time 1500    PT Time Calculation (min) 60 min    Equipment Utilized During Treatment Gait belt    Activity Tolerance Patient tolerated treatment well           Past Medical History:  Diagnosis Date  . Allergic   . Colon adenoma   . Depression   . Diabetes mellitus, type II ([place])   . Diverticulosis   . Hypertension   . Seizures ([place])   . Sleep apnea   . Urinary hesitancy     Past Surgical History:  Procedure Laterality Date  . COLON SURGERY    . HERNIA REPAIR    . UVULOPALATOPHARYNGOPLASTY, TONSILLECTOMY AND SEPTOPLASTY      There were no vitals filed for this visit.   Subjective Assessment - 06/26/20 1403    Subjective Patient is doing well today .    Pertinent History Patient has had parkinsons disease 10-12 years. He had LSVT BIG treatment several years ago and now is having decreased balance and some falls. He is going to be using a RW for ambulation.    Currently in Pain? Yes    Pain Score 5     Pain Location Knee    Pain Orientation Left    Pain Descriptors / Indicators Aching    Pain Type Acute pain    Pain Onset In the past 7 days    Pain Frequency Constant    Aggravating Factors  movement    Pain Relieving Factors ice    Effect of Pain on Daily Activities slowing him down    Multiple Pain Sites Yes   patient fell and his side and face are hurting some too           Treatment:   Patient seen for LSVT Daily Session Maximal Daily Exercises for facilitation/coordination of  movement Sustained movements are designed to rescale the amplitude of movement output for generalization to daily functional activities .Performed as follows for 1 set of 10 repetitions each multidirectional sustained movements  1) Floor to ceiling , cues to hold for 10 seconds,needs modeling for correct positions , VC to reach out further and to reach up to the ceiling 2) Side to side multidirectional Repetitive movements performed in sitting and are designed to provide retraining effort needed for sustained muscle activation in tasks , cues to lean fwd and hold position x 10 counts: Patient does not move fwd<>sidesitting<> to fwd and is not able to transition or stretch out leg very far 3) Step and reach forward , cues for good knee flex, cues to move UE and LE together, cues to rotate  arms up to pronate his forearms 4) Step and reach backwards , cues for BUE back, getting toe up and bending back knee 5) Step and reach sideways, cues to turn  head sideways, and turn head back to neutral, and to rotate arms and pronate forearms 6) Rock and reach forward/backward , cues to reach  far fwd with UE's, patient not comfortable with feet apart , not rocking very far to get a good weight shift, not able to raise  heel or raise  toes much 7) Rock and reach sideways, cues for twist and look behind , only rotates side ways, unable to twist around or turn to look behind   functional component task with supervision 5 reps and simulated activities for: 1. Sit to stand x 10   needs cues to begin with UE up for correct start position 2. buttons with 5 finger flick x 5 buttons 3.tapping 6 inch step x 20  4.small pegs in peg board for turning pages and hand coordination 5. Getting his feet in the car x 5  Patient performed with instruction, verbal cues, tactile cues of therapist: goal: increase tissue extensibility, promote proper posture, improve mobility  Pt educated throughout session about proper posture and  technique with exercises. Improved exercise technique, movement at target joints, use of target muscles after min to mod verbal, visual, tactile cues.                          PT Education - 06/26/20 1406    Education Details LSVT BIG    Person(s) Educated Patient    Methods Explanation    Comprehension Verbalized understanding;Returned demonstration;Need further instruction;Verbal cues required            PT Short Term Goals - 06/13/20 1509      PT SHORT TERM GOAL #1   Title Patient will be modified independent in home exercise program to improve strength/mobility for better functional independence with ADLs.    Time 2    Period Weeks    Status New    Target Date 06/27/20             PT Long Term Goals - 06/13/20 1505      PT LONG TERM GOAL #1   Title Patient will reduce timed up and go to <11 seconds to reduce fall risk and demonstrate improved transfer/gait ability.    Time 4    Period Weeks    Status New    Target Date 07/11/20      PT LONG TERM GOAL #2   Title Patient will be independent in home exercise program to improve strength/mobility for better functional independence with ADLs.    Time 4    Period Weeks    Status New    Target Date 07/11/20      PT LONG TERM GOAL #3   Title Patient will increase Berg Balance score by > 6 points to demonstrate decreased fall risk during functional activities.    Time 4    Period Weeks    Status New    Target Date 07/11/20      PT LONG TERM GOAL #4   Title Patient will improve FOTO score to indicate improved functional mobility and safety    Time 4    Period Weeks    Status New    Target Date 07/11/20                 Plan - 06/26/20 1424    Clinical Impression Statement Continues to have balance deficits typical with diagnosis. Patient performs intermediate level exercises without pain behaviors and needs verbal cuing for postural alignment and head positioning. Cuing is needed to  stretch the leg out in seated side reaching. Fatigue with sit to stand but demonstrating more  control .Continues to have balance deficits typical with diagnosis. Patient performs intermediate level exercises without pain behaviors and needs verbal cuing for postural alignment and head positioning. Cuing is needed to stretch the leg out in seated side reaching. Fatigue with sit to stand but demonstrating more control .     Stability/Clinical Decision Making Stable/Uncomplicated    Rehab Potential Good    PT Frequency 4x / week    PT Duration 4 weeks    PT Treatment/Interventions Therapeutic activities;Neuromuscular re-education;Balance training;Therapeutic exercise;Patient/family education;Gait training    Consulted and Agree with Plan of Care Patient           Patient will benefit from skilled therapeutic intervention in order to improve the following deficits and impairments:  Abnormal gait, Impaired flexibility, Difficulty walking, Decreased mobility, Decreased balance, Decreased coordination, Decreased activity tolerance, Decreased endurance, Decreased strength  Visit Diagnosis: Muscle weakness (generalized)  Difficulty in walking, not elsewhere classified     Problem List Patient Active Problem List   Diagnosis Date Noted  . Adenoma of large intestine 02/01/2016  . Clinical depression 02/01/2016  . 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 Benign essential HTN 02/01/2016  . 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/2016  . Pure hypercholesterolemia 02/01/2016  . Type 2 diabetes mellitus ([place]) 01/10/2016  . Microalbuminuria 01/10/2016  . Degeneration of intervertebral disc of lumbar region 07/13/2015  . Parkinson's disease ([place]) 11/16/2012    Alanson Puls, PT DPT 06/26/2020, 2:31 PM  [place] MAIN [facility] SERVICES [address] [place], [place], [address] Phone: [phone]   Fax:  [phone]  Name: Randall [name] MRN: [id number] Date of Birth: [birth year]

## 2020-06-27 ENCOUNTER — Other Ambulatory Visit: Payer: Self-pay

## 2020-06-27 ENCOUNTER — Ambulatory Visit: Payer: PPO | Admitting: Physical Therapy

## 2020-06-27 ENCOUNTER — Encounter: Payer: Self-pay | Admitting: Physical Therapy

## 2020-06-27 DIAGNOSIS — M6281 Muscle weakness (generalized): Secondary | ICD-10-CM

## 2020-06-27 DIAGNOSIS — R262 Difficulty in walking, not elsewhere classified: Secondary | ICD-10-CM

## 2020-06-27 NOTE — Therapy (Signed)
[place] MAIN [facility] SERVICES [address] [place], [place], [address] Phone: [phone]   Fax:  [phone]  Physical Therapy Treatment  Patient Details  Name: Randall [name] MRN: [id number] Date of Birth: 06-13-1951 Referring Provider (PT): Jennings Books   Encounter Date: 06/27/2020   PT End of Session - 06/27/20 1418    Visit Number 6    Number of Visits 17    Date for PT Re-Evaluation 07/25/20    Equipment Utilized During Treatment Gait belt    Activity Tolerance Patient tolerated treatment well           Past Medical History:  Diagnosis Date  . Allergic   . Colon adenoma   . Depression   . Diabetes mellitus, type II ([place])   . Diverticulosis   . Hypertension   . Seizures ([place])   . Sleep apnea   . Urinary hesitancy     Past Surgical History:  Procedure Laterality Date  . COLON SURGERY    . HERNIA REPAIR    . UVULOPALATOPHARYNGOPLASTY, TONSILLECTOMY AND SEPTOPLASTY      There were no vitals filed for this visit.   Subjective Assessment - 06/27/20 1417    Subjective Patient is doing well today .    Pertinent History Patient has had parkinsons disease 10-12 years. He had LSVT BIG treatment several years ago and now is having decreased balance and some falls. He is going to be using a RW for ambulation.    Currently in Pain? No/denies    Pain Score 0-No pain    Pain Onset In the past 7 days           Treatment: Patient seen for LSVT Daily Session Maximal Daily Exercises for facilitation/coordination of movement Sustained movements are designed to rescale the amplitude of movement output for generalization to daily functional activities .Performed as follows for 1 set of 10 repetitions each multidirectional sustained movements  1) Floor to ceiling , cues to hold for 10 seconds,needs modeling for correct positions, VC to reach out furtherand to reach up to the ceiling 2) Side to side multidirectional Repetitive  movements performed in sittingand are designed to provide retraining effort needed for sustained muscle activation in tasks, cues to lean fwd and hold position x 10 counts: Patient does not move fwd<>sidesitting<> to fwd and is not able to transition or stretch out leg very far 3) Step and reach forward , cues for good knee flex, cues to move UE and LE together, cues to rotate arms up to pronate his forearms 4) Step and reach backwards , cues for BUE back, getting toe up and bending back knee 5) Step and reach sideways, cues to turn head sideways, and turn head back to neutral, and to rotate arms and pronate forearms 6) Rock and reach forward/backward , cues to reach far fwd with UE's, patient not comfortable with feet apart, not rocking very far to get a good weight shift, not able to raise heel or raise toes much 7) Rock and reach sideways, cues for twist and look behind, only rotates side ways, unable to twist around or turn to look behind  functional component task with supervision 5 reps and simulated activities for: 1. Sit to standx 10needs cues to begin with UE up for correct start position 2.legs over a stool for car ease. 3.tapping 6 inch step x 20  4.small pegs in peg board for turning pages and hand coordination 5.buttons with 5 finger flicks  x 5  Patient performed with instruction, verbal cues, tactile cues of therapist: goal:increase tissue extensibility, promote proper posture, improve mobility Pt educated throughout session about proper posture and technique with exercises. Improved exercise technique, movement at target joints, use of target muscles after min to mod verbal, visual, tactile cues.                          PT Education - 06/27/20 1418    Education Details LSVT BIG    Person(s) Educated Patient    Methods Explanation    Comprehension Verbalized understanding;Tactile cues required            PT Short Term Goals - 06/13/20  1509      PT SHORT TERM GOAL #1   Title Patient will be modified independent in home exercise program to improve strength/mobility for better functional independence with ADLs.    Time 2    Period Weeks    Status New    Target Date 06/27/20             PT Long Term Goals - 06/13/20 1505      PT LONG TERM GOAL #1   Title Patient will reduce timed up and go to <11 seconds to reduce fall risk and demonstrate improved transfer/gait ability.    Time 4    Period Weeks    Status New    Target Date 07/11/20      PT LONG TERM GOAL #2   Title Patient will be independent in home exercise program to improve strength/mobility for better functional independence with ADLs.    Time 4    Period Weeks    Status New    Target Date 07/11/20      PT LONG TERM GOAL #3   Title Patient will increase Berg Balance score by > 6 points to demonstrate decreased fall risk during functional activities.    Time 4    Period Weeks    Status New    Target Date 07/11/20      PT LONG TERM GOAL #4   Title Patient will improve FOTO score to indicate improved functional mobility and safety    Time 4    Period Weeks    Status New    Target Date 07/11/20                 Plan - 06/27/20 1418    Clinical Impression Statement Patient has unsteady stepping with several exercises, but his motor control improves with practice. Patient needs occasional verbal cueing to improve posture and cueing to correctly perform exercises slowly, holding at end of range to increase motor firing of desired muscle to encourage fatigue. Patients performance improves with practice and uses UE to help support and for balance.   Stability/Clinical Decision Making Stable/Uncomplicated    Rehab Potential Good    PT Frequency 4x / week    PT Duration 4 weeks    PT Treatment/Interventions Therapeutic activities;Neuromuscular re-education;Balance training;Therapeutic exercise;Patient/family education;Gait training    Consulted  and Agree with Plan of Care Patient           Patient will benefit from skilled therapeutic intervention in order to improve the following deficits and impairments:  Abnormal gait, Impaired flexibility, Difficulty walking, Decreased mobility, Decreased balance, Decreased coordination, Decreased activity tolerance, Decreased endurance, Decreased strength  Visit Diagnosis: Muscle weakness (generalized)  Difficulty in walking, not elsewhere classified     Problem List Patient  Active Problem List   Diagnosis Date Noted  . Adenoma of large intestine 02/01/2016  . Clinical depression 02/01/2016  . 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 Benign essential HTN 02/01/2016  . 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/2016  . Pure hypercholesterolemia 02/01/2016  . Type 2 diabetes mellitus ([place]) 01/10/2016  . Microalbuminuria 01/10/2016  . Degeneration of intervertebral disc of lumbar region 07/13/2015  . Parkinson's disease ([place]) 11/16/2012    Alanson Puls, PT DPT 06/27/2020, 2:19 PM  [place] MAIN [facility] SERVICES [address] [place], [place], [address] Phone: [phone]   Fax:  [phone]  Name: Randall [name] MRN: [id number] Date of Birth: [birth year]

## 2020-06-28 ENCOUNTER — Other Ambulatory Visit: Payer: Self-pay

## 2020-06-28 ENCOUNTER — Ambulatory Visit: Payer: PPO | Admitting: Physical Therapy

## 2020-06-28 ENCOUNTER — Encounter: Payer: Self-pay | Admitting: Physical Therapy

## 2020-06-28 DIAGNOSIS — R262 Difficulty in walking, not elsewhere classified: Secondary | ICD-10-CM | POA: Diagnosis not present

## 2020-06-28 DIAGNOSIS — M6281 Muscle weakness (generalized): Secondary | ICD-10-CM

## 2020-06-28 NOTE — Therapy (Signed)
[place] MAIN [facility] SERVICES [address] [place], [place], [address] Phone: [phone]   Fax:  [phone]  Physical Therapy Treatment  Patient Details  Name: Randall [name] MRN: [id number] Date of Birth: 06-19-1951 Referring Provider (PT): Jennings Books   Encounter Date: 06/28/2020   PT End of Session - 06/28/20 1405    Visit Number 7    Number of Visits 17    Date for PT Re-Evaluation 07/25/20    PT Start Time 1401    PT Stop Time 1500    PT Time Calculation (min) 59 min    Equipment Utilized During Treatment Gait belt    Activity Tolerance Patient tolerated treatment well    Behavior During Therapy WFL for tasks assessed/performed           Past Medical History:  Diagnosis Date  . Allergic   . Colon adenoma   . Depression   . Diabetes mellitus, type II ([place])   . Diverticulosis   . Hypertension   . Seizures ([place])   . Sleep apnea   . Urinary hesitancy     Past Surgical History:  Procedure Laterality Date  . COLON SURGERY    . HERNIA REPAIR    . UVULOPALATOPHARYNGOPLASTY, TONSILLECTOMY AND SEPTOPLASTY      There were no vitals filed for this visit.   Subjective Assessment - 06/28/20 1401    Subjective Patient is doing well today .    Pertinent History Patient has had parkinsons disease 10-12 years. He had LSVT BIG treatment several years ago and now is having decreased balance and some falls. He is going to be using a RW for ambulation.    Currently in Pain? No/denies    Pain Score 0-No pain    Pain Onset In the past 7 days              Treatment:   Patient seen for LSVT Daily Session Maximal Daily Exercises for facilitation/coordination of movement Sustained movements are designed to rescale the amplitude of movement output for generalization to daily functional activities .Performed as follows for 1 set of 10 repetitions each multidirectional sustained movements  1) Floor to ceiling , cues to hold for 10  seconds,needs modeling for correct positions , VC to reach out further and to reach up to the ceiling 2) Side to side multidirectional Repetitive movements performed in sitting and are designed to provide retraining effort needed for sustained muscle activation in tasks , cues to lean fwd and hold position x 10 counts: Patient does not move fwd<>sidesitting<> to fwd and is not able to transition or stretch out leg very far 3) Step and reach forward , cues for good knee flex, cues to move UE and LE together, cues to rotate  arms up to pronate his forearms 4) Step and reach backwards , cues for BUE back, getting toe up and bending back knee 5) Step and reach sideways, cues to turn  head sideways, and turn head back to neutral, and to rotate arms and pronate forearms 6) Rock and reach forward/backward , cues to reach far fwd with UE's, patient not comfortable with feet apart , not rocking very far to get a good weight shift, not able to raise  heel or raise  toes much 7) Rock and reach sideways, cues for twist and look behind , only rotates side ways, unable to twist around or turn to look behind   functional component task with supervision 5 reps  and simulated activities for: 1. Sit to stand x 10   needs cues to begin with UE up for correct start position 2. buttons with 5 finger flick x 5 buttons 3.tapping a 6 inch stool  10 4.dealing cards/ pegs in a board for small coordination of UE to assist with turning pages in a book 5. Getting his feet on and off a 6 inch stool  x 5  Patient performed with instruction, verbal cues, tactile cues of therapist: goal: increase tissue extensibility, promote proper posture, improve mobility  Pt educated throughout session about proper posture and technique with exercises. Improved exercise technique, movement at target joints, use of target muscles after min to mod verbal, visual, tactile cues.                         PT Education - 06/28/20  1405    Education Details LSVT BIG    Person(s) Educated Patient    Methods Explanation    Comprehension Verbalized understanding            PT Short Term Goals - 06/13/20 1509      PT SHORT TERM GOAL #1   Title Patient will be modified independent in home exercise program to improve strength/mobility for better functional independence with ADLs.    Time 2    Period Weeks    Status New    Target Date 06/27/20             PT Long Term Goals - 06/13/20 1505      PT LONG TERM GOAL #1   Title Patient will reduce timed up and go to <11 seconds to reduce fall risk and demonstrate improved transfer/gait ability.    Time 4    Period Weeks    Status New    Target Date 07/11/20      PT LONG TERM GOAL #2   Title Patient will be independent in home exercise program to improve strength/mobility for better functional independence with ADLs.    Time 4    Period Weeks    Status New    Target Date 07/11/20      PT LONG TERM GOAL #3   Title Patient will increase Berg Balance score by > 6 points to demonstrate decreased fall risk during functional activities.    Time 4    Period Weeks    Status New    Target Date 07/11/20      PT LONG TERM GOAL #4   Title Patient will improve FOTO score to indicate improved functional mobility and safety    Time 4    Period Weeks    Status New    Target Date 07/11/20                 Plan - 06/28/20 1405    Clinical Impression Statement Pt presents with slowness of movements and fatigues with therapeutic exercises. Patient needs verbal, tactile and modeling cues with LSVT BIG exercises. Patient demonstrates difficulty with remembering to hold the seated exercises and the start and finish positions. Patient tolerated all interventions well this date and will benefit from continued skilled PT interventions to improve strength and balance and decrease risk of falling.   Stability/Clinical Decision Making Stable/Uncomplicated    Rehab  Potential Good    PT Frequency 4x / week    PT Duration 4 weeks    PT Treatment/Interventions Therapeutic activities;Neuromuscular re-education;Balance training;Therapeutic exercise;Patient/family education;Gait training  Consulted and Agree with Plan of Care Patient           Patient will benefit from skilled therapeutic intervention in order to improve the following deficits and impairments:  Abnormal gait, Impaired flexibility, Difficulty walking, Decreased mobility, Decreased balance, Decreased coordination, Decreased activity tolerance, Decreased endurance, Decreased strength  Visit Diagnosis: Muscle weakness (generalized)  Difficulty in walking, not elsewhere classified     Problem List Patient Active Problem List   Diagnosis Date Noted  . Adenoma of large intestine 02/01/2016  . Clinical depression 02/01/2016  . 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 Benign essential HTN 02/01/2016  . 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/2016  . Pure hypercholesterolemia 02/01/2016  . Type 2 diabetes mellitus ([place]) 01/10/2016  . Microalbuminuria 01/10/2016  . Degeneration of intervertebral disc of lumbar region 07/13/2015  . Parkinson's disease ([place]) 11/16/2012    Alanson Puls, PT DPT 06/28/2020, 2:07 PM  [place] MAIN [facility] SERVICES [address] [place], [place], [address] Phone: [phone]   Fax:  [phone]  Name: KORDAE BUONOCORE MRN: [id number] Date of Birth: [birth year]

## 2020-06-29 ENCOUNTER — Encounter: Payer: Self-pay | Admitting: Physical Therapy

## 2020-06-29 ENCOUNTER — Other Ambulatory Visit: Payer: Self-pay

## 2020-06-29 ENCOUNTER — Ambulatory Visit: Payer: PPO | Admitting: Physical Therapy

## 2020-06-29 DIAGNOSIS — R262 Difficulty in walking, not elsewhere classified: Secondary | ICD-10-CM

## 2020-06-29 DIAGNOSIS — M6281 Muscle weakness (generalized): Secondary | ICD-10-CM

## 2020-06-29 NOTE — Therapy (Signed)
[place] MAIN [facility] SERVICES [address] [place], [place], [address] Phone: [phone]   Fax:  [phone]  Physical Therapy Treatment  Patient Details  Name: Randall [name] MRN: [id number] Date of Birth: 01/25/1951 Referring Provider (PT): Jennings Books   Encounter Date: 06/29/2020   PT End of Session - 06/29/20 1406    Visit Number 8    Number of Visits 17    Date for PT Re-Evaluation 07/25/20    PT Start Time 1400    PT Stop Time 1500    PT Time Calculation (min) 60 min    Equipment Utilized During Treatment Gait belt    Activity Tolerance Patient tolerated treatment well    Behavior During Therapy WFL for tasks assessed/performed           Past Medical History:  Diagnosis Date  . Allergic   . Colon adenoma   . Depression   . Diabetes mellitus, type II ([place])   . Diverticulosis   . Hypertension   . Seizures ([place])   . Sleep apnea   . Urinary hesitancy     Past Surgical History:  Procedure Laterality Date  . COLON SURGERY    . HERNIA REPAIR    . UVULOPALATOPHARYNGOPLASTY, TONSILLECTOMY AND SEPTOPLASTY      There were no vitals filed for this visit.   Subjective Assessment - 06/29/20 1405    Subjective Patient is doing well today .    Pertinent History Patient has had parkinsons disease 10-12 years. He had LSVT BIG treatment several years ago and now is having decreased balance and some falls. He is going to be using a RW for ambulation.    Currently in Pain? No/denies    Pain Score 0-No pain    Pain Onset In the past 7 days            Treatment: Patient seen for LSVT Daily Session Maximal Daily Exercises for facilitation/coordination of movement Sustained movements are designed to rescale the amplitude of movement output for generalization to daily functional activities .Performed as follows for 1 set of 10 repetitions each multidirectional sustained movements  1) Floor to ceiling , cues to hold for 10  seconds,needs modeling for correct positions, VC to reach out furtherand to reach up to the ceiling 2) Side to side multidirectional Repetitive movements performed in sittingand are designed to provide retraining effort needed for sustained muscle activation in tasks, cues to lean fwd and hold position x 10 counts: Patient does not move fwd<>sidesitting<> to fwd and is not able to transition or stretch out leg very far 3) Step and reach forward , cues for good knee flex, cues to move UE and LE together, cues to rotate arms up to pronate his forearms 4) Step and reach backwards , cues for BUE back, getting toe up and bending back knee 5) Step and reach sideways, cues to turn head sideways, and turn head back to neutral, and to rotate arms and pronate forearms 6) Rock and reach forward/backward , cues to reach far fwd with UE's, patient not comfortable with feet apart, not rocking very far to get a good weight shift, not able to raise heel or raise toes much 7) Rock and reach sideways, cues for twist and look behind, only rotates side ways, unable to twist around or turn to look behind  functional component task with supervision 5 reps and simulated activities for: 1. Sit to standx 10needs cues to begin with UE up  for correct start position 2.buttons with 5 finger flick x 5 buttons 3.tapping a 6 inch stool  10 4.dealing cards/ pegs in a board for small coordination of UE to assist with turning pages in a book 5. Gettinghis feet on and off a 6 inch stool  x 5  Big walk with arm swing 1000 feet x 2  Patient performed with instruction, verbal cues, tactile cues of therapist: goal:increase tissue extensibility, promote proper posture, improve mobility Pt educated throughout session about proper posture and technique with exercises. Improved exercise technique, movement at target joints, use of target muscles after min to mod verbal, visual, tactile  cues.                           PT Education - 06/29/20 1406    Education Details LSVT BIG    Person(s) Educated Patient    Methods Explanation    Comprehension Verbalized understanding;Tactile cues required            PT Short Term Goals - 06/13/20 1509      PT SHORT TERM GOAL #1   Title Patient will be modified independent in home exercise program to improve strength/mobility for better functional independence with ADLs.    Time 2    Period Weeks    Status New    Target Date 06/27/20             PT Long Term Goals - 06/13/20 1505      PT LONG TERM GOAL #1   Title Patient will reduce timed up and go to <11 seconds to reduce fall risk and demonstrate improved transfer/gait ability.    Time 4    Period Weeks    Status New    Target Date 07/11/20      PT LONG TERM GOAL #2   Title Patient will be independent in home exercise program to improve strength/mobility for better functional independence with ADLs.    Time 4    Period Weeks    Status New    Target Date 07/11/20      PT LONG TERM GOAL #3   Title Patient will increase Berg Balance score by > 6 points to demonstrate decreased fall risk during functional activities.    Time 4    Period Weeks    Status New    Target Date 07/11/20      PT LONG TERM GOAL #4   Title Patient will improve FOTO score to indicate improved functional mobility and safety    Time 4    Period Weeks    Status New    Target Date 07/11/20                 Plan - 06/29/20 1407    Clinical Impression Statement Patient has unsteady stepping with several exercises, but his motor control improves with practice. Patient needs occasional verbal cueing to improve posture and cueing to correctly perform exercises slowly, holding at end of range to increase motor firing of desired muscle to encourage fatigue. Patients performance improves with practice and uses UE to help support and for balance.   Stability/Clinical  Decision Making Stable/Uncomplicated    Rehab Potential Good    PT Frequency 4x / week    PT Duration 4 weeks    PT Treatment/Interventions Therapeutic activities;Neuromuscular re-education;Balance training;Therapeutic exercise;Patient/family education;Gait training    Consulted and Agree with Plan of Care Patient  Patient will benefit from skilled therapeutic intervention in order to improve the following deficits and impairments:  Abnormal gait, Impaired flexibility, Difficulty walking, Decreased mobility, Decreased balance, Decreased coordination, Decreased activity tolerance, Decreased endurance, Decreased strength  Visit Diagnosis: Muscle weakness (generalized)  Difficulty in walking, not elsewhere classified     Problem List Patient Active Problem List   Diagnosis Date Noted  . Adenoma of large intestine 02/01/2016  . Clinical depression 02/01/2016  . 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 Benign essential HTN 02/01/2016  . 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/2016  . Pure hypercholesterolemia 02/01/2016  . Type 2 diabetes mellitus ([place]) 01/10/2016  . Microalbuminuria 01/10/2016  . Degeneration of intervertebral disc of lumbar region 07/13/2015  . Parkinson's disease ([place]) 11/16/2012    Alanson Puls, PT DPT 06/29/2020, 3:02 PM  [place] MAIN [facility] SERVICES [address] [place], [place], [address] Phone: [phone]   Fax:  [phone]  Name: HERBERTO LEDWELL MRN: [id number] Date of Birth: [birth year]

## 2020-06-30 ENCOUNTER — Telehealth: Payer: Self-pay

## 2020-06-30 NOTE — Telephone Encounter (Signed)
1 pm.  Phone call made to patient to follow up on overall condition.  Phone answered by wife Randall [name].  She reports patient has been going to physical therapy 4 x weekly for the last 3 weeks.  She then passed the phone to the patient.  Patient also reported physical therapy for the last few weeks.  He feels this has been helpful and weakness has improved.  He reports 2-3 falls about 2 wks ago.  No injuries were noted only bruising.  He continues with blood sugar checks daily or every other day.  This am blood sugar is 74.  Appetite has been good and he is averaging about 2 meals a day.  He has been sleeping well for the most part but notes one night this week of experiencing insomnia.  He typically takes melatonin that is helpful.  Medications reviewed and no changes are noted at this time.  Patient discussed feelings of depression related to his overall condition and loss of his brother earlier this year.  He notes he is processing through this but does feel sadness at times. Patient voiced his appreciation for this call and is looking forward to an in-person visit soon.

## 2020-07-03 ENCOUNTER — Ambulatory Visit: Payer: PPO | Admitting: Physical Therapy

## 2020-07-04 ENCOUNTER — Encounter: Payer: Self-pay | Admitting: Physical Therapy

## 2020-07-04 ENCOUNTER — Ambulatory Visit: Payer: PPO | Admitting: Physical Therapy

## 2020-07-04 ENCOUNTER — Other Ambulatory Visit: Payer: Self-pay

## 2020-07-04 DIAGNOSIS — M6281 Muscle weakness (generalized): Secondary | ICD-10-CM

## 2020-07-04 DIAGNOSIS — R262 Difficulty in walking, not elsewhere classified: Secondary | ICD-10-CM

## 2020-07-04 NOTE — Therapy (Signed)
[place] MAIN [facility] SERVICES [address] [place], [place], [address] Phone: [phone]   Fax:  [phone]  Physical Therapy Treatment  Patient Details  Name: Randall [name] MRN: [id number] Date of Birth: 11/21/1950 Referring Provider (PT): Jennings Books   Encounter Date: 07/04/2020   PT End of Session - 07/04/20 1413    Visit Number 9    Number of Visits 17    Date for PT Re-Evaluation 07/25/20    PT Start Time 1400    PT Stop Time 1500    PT Time Calculation (min) 60 min    Equipment Utilized During Treatment Gait belt    Activity Tolerance Patient tolerated treatment well    Behavior During Therapy WFL for tasks assessed/performed           Past Medical History:  Diagnosis Date  . Allergic   . Colon adenoma   . Depression   . Diabetes mellitus, type II ([place])   . Diverticulosis   . Hypertension   . Seizures ([place])   . Sleep apnea   . Urinary hesitancy     Past Surgical History:  Procedure Laterality Date  . COLON SURGERY    . HERNIA REPAIR    . UVULOPALATOPHARYNGOPLASTY, TONSILLECTOMY AND SEPTOPLASTY      There were no vitals filed for this visit.   Subjective Assessment - 07/04/20 1412    Subjective Patient is doing well today .    Pertinent History Patient has had parkinsons disease 10-12 years. He had LSVT BIG treatment several years ago and now is having decreased balance and some falls. He is going to be using a RW for ambulation.    Currently in Pain? No/denies    Pain Score 0-No pain    Pain Onset In the past 7 days            Treatment: Patient seen for LSVT Daily Session Maximal Daily Exercises for facilitation/coordination of movement Sustained movements are designed to rescale the amplitude of movement output for generalization to daily functional activities .Performed as follows for 1 set of 10 repetitions each multidirectional sustained movements  1) Floor to ceiling , cues to hold for 10  seconds,needs modeling for correct positions, VC to reach out furtherand to reach up to the ceiling 2) Side to side multidirectional Repetitive movements performed in sittingand are designed to provide retraining effort needed for sustained muscle activation in tasks, cues to lean fwd and hold position x 10 counts: Patient does not move fwd<>sidesitting<> to fwd and is not able to transition or stretch out leg very far 3) Step and reach forward , cues for good knee flex, cues to move UE and LE together, cues to rotate arms up to pronate his forearms 4) Step and reach backwards , cues for BUE back, getting toe up and bending back knee 5) Step and reach sideways, cues to turn head sideways, and turn head back to neutral, and to rotate arms and pronate forearms 6) Rock and reach forward/backward , cues to reach far fwd with UE's, patient not comfortable with feet apart, not rocking very far to get a good weight shift, not able to raise heel or raise toes much 7) Rock and reach sideways, cues for twist and look behind, only rotates side ways, unable to twist around or turn to look behind  functional component task with supervision 5 reps and simulated activities for: 1. Sit to standx 10needs cues to begin with UE up  for correct start position 2.scooting his chair close to the table x 5  3.tapping a 6 inch stool 10 4.dealing cards/ pegs in a board for small coordination of UE to assist with turning pages in a book 5. Gettinghis feeton and off a 6 inch stoolx 5  Big walk with arm swing 1000 feet x 2  Patient performed with instruction, verbal cues, tactile cues of therapist: goal:increase tissue extensibility, promote proper posture, improve mobility Pt educated throughout session about proper posture and technique with exercises. Improved exercise technique, movement at target joints, use of target muscles after min to mod verbal, visual, tactile  cues.                           PT Education - 07/04/20 1412    Education Details LSVT BIG    Person(s) Educated Patient    Methods Explanation    Comprehension Verbalized understanding;Tactile cues required;Returned demonstration;Need further instruction            PT Short Term Goals - 06/13/20 1509      PT SHORT TERM GOAL #1   Title Patient will be modified independent in home exercise program to improve strength/mobility for better functional independence with ADLs.    Time 2    Period Weeks    Status New    Target Date 06/27/20             PT Long Term Goals - 06/13/20 1505      PT LONG TERM GOAL #1   Title Patient will reduce timed up and go to <11 seconds to reduce fall risk and demonstrate improved transfer/gait ability.    Time 4    Period Weeks    Status New    Target Date 07/11/20      PT LONG TERM GOAL #2   Title Patient will be independent in home exercise program to improve strength/mobility for better functional independence with ADLs.    Time 4    Period Weeks    Status New    Target Date 07/11/20      PT LONG TERM GOAL #3   Title Patient will increase Berg Balance score by > 6 points to demonstrate decreased fall risk during functional activities.    Time 4    Period Weeks    Status New    Target Date 07/11/20      PT LONG TERM GOAL #4   Title Patient will improve FOTO score to indicate improved functional mobility and safety    Time 4    Period Weeks    Status New    Target Date 07/11/20                 Plan - 07/04/20 1414    Clinical Impression Statement Patient needs cueing for BIG swing and BIG amplitude.  Patient needs cueing for correct BIG arm swing. Patient improves with practice. Patient needs cuing to consistently swing her arms and also swing reciprocally. Patient needs cuing to swing UE's reciprocally with weight shift exercise and with backward stepping and correct UE positioning.    Stability/Clinical Decision Making Stable/Uncomplicated    Rehab Potential Good    PT Frequency 4x / week    PT Duration 4 weeks    PT Treatment/Interventions Therapeutic activities;Neuromuscular re-education;Balance training;Therapeutic exercise;Patient/family education;Gait training    Consulted and Agree with Plan of Care Patient  Patient will benefit from skilled therapeutic intervention in order to improve the following deficits and impairments:  Abnormal gait, Impaired flexibility, Difficulty walking, Decreased mobility, Decreased balance, Decreased coordination, Decreased activity tolerance, Decreased endurance, Decreased strength  Visit Diagnosis: Difficulty in walking, not elsewhere classified  Muscle weakness (generalized)     Problem List Patient Active Problem List   Diagnosis Date Noted  . Adenoma of large intestine 02/01/2016  . Clinical depression 02/01/2016  . 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 Benign essential HTN 02/01/2016  . 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/2016  . Pure hypercholesterolemia 02/01/2016  . Type 2 diabetes mellitus ([place]) 01/10/2016  . Microalbuminuria 01/10/2016  . Degeneration of intervertebral disc of lumbar region 07/13/2015  . Parkinson's disease ([place]) 11/16/2012    Alanson Puls, PT DPT 07/04/2020, 2:45 PM  [place] MAIN [facility] SERVICES [address] [place], [place], [address] Phone: [phone]   Fax:  [phone]  Name: YSMAEL HIRES MRN: [id number] Date of Birth: April 05, 1951

## 2020-07-05 ENCOUNTER — Ambulatory Visit: Payer: PPO | Admitting: Physical Therapy

## 2020-07-05 ENCOUNTER — Other Ambulatory Visit: Payer: Self-pay

## 2020-07-05 ENCOUNTER — Encounter: Payer: Self-pay | Admitting: Physical Therapy

## 2020-07-05 DIAGNOSIS — R262 Difficulty in walking, not elsewhere classified: Secondary | ICD-10-CM | POA: Diagnosis not present

## 2020-07-05 DIAGNOSIS — M6281 Muscle weakness (generalized): Secondary | ICD-10-CM

## 2020-07-05 NOTE — Therapy (Signed)
[place] MAIN [facility] SERVICES [address] [place], [place], [address] Phone: [phone]   Fax:  [phone]  Physical Therapy Treatment Physical Therapy Progress Note   Dates of reporting period 06/13/20   to 07/05/20  Patient Details  Name: Randall [name] MRN: [id number] Date of Birth: 1950-10-23 Referring Provider (PT): Jennings Books   Encounter Date: 07/05/2020   PT End of Session - 07/05/20 1437    Visit Number 10    Number of Visits 17    Date for PT Re-Evaluation 07/25/20    PT Start Time 1400    PT Stop Time 1500    PT Time Calculation (min) 60 min    Equipment Utilized During Treatment Gait belt    Activity Tolerance Patient tolerated treatment well    Behavior During Therapy WFL for tasks assessed/performed           Past Medical History:  Diagnosis Date  . Allergic   . Colon adenoma   . Depression   . Diabetes mellitus, type II ([place])   . Diverticulosis   . Hypertension   . Seizures ([place])   . Sleep apnea   . Urinary hesitancy     Past Surgical History:  Procedure Laterality Date  . COLON SURGERY    . HERNIA REPAIR    . UVULOPALATOPHARYNGOPLASTY, TONSILLECTOMY AND SEPTOPLASTY      There were no vitals filed for this visit.   Subjective Assessment - 07/05/20 1437    Subjective Patient is doing well today .    Pertinent History Patient has had parkinsons disease 10-12 years. He had LSVT BIG treatment several years ago and now is having decreased balance and some falls. He is going to be using a RW for ambulation.    Currently in Pain? No/denies    Pain Score 0-No pain             OUTCOME MEASURES: TEST Outcome Interpretation  5 times sit<>stand 10.08 sec >60 yo, >15 sec indicates increased risk for falls  10 meter walk test 1.36               m/s <1.0 m/s indicates increased risk for falls; limited community ambulator  Timed up and Go     6.48, cog 10.02 carry 10.96           sec <14 sec indicates  increased risk for falls  6 minute walk test   1395             Feet 1000 feet is community Water quality scientist 49/56 <36/56 (100% risk for falls), 37-45 (80% risk for falls); 46-51 (>50% risk for falls); 52-55 (lower risk <25% of falls)  9 Hole Peg Test L: 27.22             R: 32.89 Decrease by 6 points is significant        Treatment: Patient seen for LSVT Daily Session Maximal Daily Exercises for facilitation/coordination of movement Sustained movements are designed to rescale the amplitude of movement output for generalization to daily functional activities .Performed as follows for 1 set of 10 repetitions each multidirectional sustained movements  1) Floor to ceiling , cues to hold for 10 seconds,needs modeling for correct positions, VC to reach out furtherand to reach up to the ceiling 2) Side to side multidirectional Repetitive movements performed in sittingand are designed to provide retraining effort needed for sustained muscle activation in tasks, cues to lean fwd and hold  position x 10 counts: Patient does not move fwd<>sidesitting<> to fwd and is not able to transition or stretch out leg very far 3) Step and reach forward , cues for good knee flex, cues to move UE and LE together, cues to rotate arms up to pronate his forearms 4) Step and reach backwards , cues for BUE back, getting toe up and bending back knee 5) Step and reach sideways, cues to turn head sideways, and turn head back to neutral, and to rotate arms and pronate forearms 6) Rock and reach forward/backward , cues to reach far fwd with UE's, patient not comfortable with feet apart, not rocking very far to get a good weight shift, not able to raise heel or raise toes much 7) Rock and reach sideways, cues for twist and look behind, only rotates side ways, unable to twist around or turn to look behind  functional component task with supervision 5 reps and simulated activities for: 1. Sit to  standx 10needs cues to begin with UE up for correct start position 2.scooting his chair close to the table x 5  3.tapping a 6 inch stool 10 4.dealing cards/ pegs in a board for small coordination of UE to assist with turning pages in a book 5. Gettinghis feeton and off a 6 inch stoolx 5  Big walk with arm swing 1000 feet x 2 Patient performed with instruction, verbal cues, tactile cues of therapist: goal:increase tissue extensibility, promote proper posture, improve mobility Pt educated throughout session about proper posture and technique with exercises. Improved exercise technique, movement at target joints, use of target muscles after min to mod verbal, visual, tactile cues.                       PT Education - 07/05/20 1437    Education Details LSVT BIG    Person(s) Educated Patient    Methods Explanation    Comprehension Verbalized understanding            PT Short Term Goals - 06/13/20 1509      PT SHORT TERM GOAL #1   Title Patient will be modified independent in home exercise program to improve strength/mobility for better functional independence with ADLs.    Time 2    Period Weeks    Status New    Target Date 06/27/20             PT Long Term Goals - 07/05/20 1439      PT LONG TERM GOAL #1   Title Patient will reduce timed up and go to <11 seconds to reduce fall risk and demonstrate improved transfer/gait ability.    Baseline 07/05/20= 6.48, cog 10.02, carry 10.96    Time 4    Period Weeks    Status Partially Met    Target Date 07/11/20      PT LONG TERM GOAL #2   Title Patient will be independent in home exercise program to improve strength/mobility for better functional independence with ADLs.    Time 4    Period Weeks    Status Partially Met    Target Date 07/11/20      PT LONG TERM GOAL #3   Title Patient will increase Berg Balance score by > 6 points to demonstrate decreased fall risk during functional activities.     Baseline 07/05/20= 47/56    Time 4    Period Weeks    Status Partially Met    Target Date 07/11/20  PT LONG TERM GOAL #4   Title Patient will improve FOTO score to indicate improved functional mobility and safety    Baseline 07/05/20=73    Time 4    Period Weeks    Status Partially Met    Target Date 07/11/20                 Plan - 07/05/20 1438    Clinical Impression Statement Patient's condition has the potential to improve in response to therapy. Maximum improvement is yet to be obtained. The anticipated improvement is attainable and reasonable in a generally predictable time.  Patient reports that his functional mobility depends on how active he is during the day. His outcome measures improved and his goals were reviewed and progressed. Patient will continue to benefit from skilled PT to improve mobility.    Stability/Clinical Decision Making Stable/Uncomplicated    Rehab Potential Good    PT Frequency 4x / week    PT Duration 4 weeks    PT Treatment/Interventions Therapeutic activities;Neuromuscular re-education;Balance training;Therapeutic exercise;Patient/family education;Gait training    Consulted and Agree with Plan of Care Patient           Patient will benefit from skilled therapeutic intervention in order to improve the following deficits and impairments:  Abnormal gait, Impaired flexibility, Difficulty walking, Decreased mobility, Decreased balance, Decreased coordination, Decreased activity tolerance, Decreased endurance, Decreased strength  Visit Diagnosis: Difficulty in walking, not elsewhere classified  Muscle weakness (generalized)     Problem List Patient Active Problem List   Diagnosis Date Noted  . Adenoma of large intestine 02/01/2016  . Clinical depression 02/01/2016  . 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 Benign essential HTN 02/01/2016  . 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/2016  . Pure hypercholesterolemia  02/01/2016  . Type 2 diabetes mellitus ([place]) 01/10/2016  . Microalbuminuria 01/10/2016  . Degeneration of intervertebral disc of lumbar region 07/13/2015  . Parkinson's disease ([place]) 11/16/2012    Alanson Puls, PT DPT 07/05/2020, 2:57 PM  [place] MAIN [facility] SERVICES [address] [place], [place], [address] Phone: [phone]   Fax:  [phone]  Name: Randall [name] MRN: [id number] Date of Birth: May 14, 1951

## 2020-07-06 ENCOUNTER — Encounter: Payer: Self-pay | Admitting: Physical Therapy

## 2020-07-06 ENCOUNTER — Ambulatory Visit: Payer: PPO | Admitting: Physical Therapy

## 2020-07-06 ENCOUNTER — Other Ambulatory Visit: Payer: Self-pay

## 2020-07-06 DIAGNOSIS — R262 Difficulty in walking, not elsewhere classified: Secondary | ICD-10-CM

## 2020-07-06 DIAGNOSIS — M6281 Muscle weakness (generalized): Secondary | ICD-10-CM

## 2020-07-06 NOTE — Therapy (Signed)
[place] MAIN [facility] SERVICES [address] [place], [place], [address] Phone: [phone]   Fax:  [phone]  Physical Therapy Treatment  Patient Details  Name: Randall [name] MRN: [id number] Date of Birth: 1951/01/20 Referring Provider (PT): shah, [place]   Encounter Date: 07/06/2020   PT End of Session - 07/06/20 1552    Visit Number 11    Number of Visits 17    Date for PT Re-Evaluation 07/25/20    PT Start Time 1400    PT Stop Time 1500    PT Time Calculation (min) 60 min    Equipment Utilized During Treatment Gait belt    Activity Tolerance Patient tolerated treatment well    Behavior During Therapy WFL for tasks assessed/performed           Past Medical History:  Diagnosis Date  . Allergic   . Colon adenoma   . Depression   . Diabetes mellitus, type II ([place])   . Diverticulosis   . Hypertension   . Seizures ([place])   . Sleep apnea   . Urinary hesitancy     Past Surgical History:  Procedure Laterality Date  . COLON SURGERY    . HERNIA REPAIR    . UVULOPALATOPHARYNGOPLASTY, TONSILLECTOMY AND SEPTOPLASTY      There were no vitals filed for this visit.   Subjective Assessment - 07/06/20 1552    Subjective Patient is doing well today .    Pertinent History Patient has had parkinsons disease 10-12 years. He had LSVT BIG treatment several years ago and now is having decreased balance and some falls. He is going to be using a RW for ambulation.    Currently in Pain? No/denies    Pain Score 0-No pain             Treatment: Patient seen for LSVT Daily Session Maximal Daily Exercises for facilitation/coordination of movement Sustained movements are designed to rescale the amplitude of movement output for generalization to daily functional activities .Performed as follows for 1 set of 10 repetitions each multidirectional sustained movements  1) Floor to ceiling , cues to hold for 10 seconds,needs modeling for correct  positions, VC to reach out furtherand to reach up to the ceiling 2) Side to side multidirectional Repetitive movements performed in sittingand are designed to provide retraining effort needed for sustained muscle activation in tasks, cues to lean fwd and hold position x 10 counts: Patient does not move fwd<>sidesitting<> to fwd and is not able to transition or stretch out leg very far 3) Step and reach forward , cues for good knee flex, cues to move UE and LE together, cues to rotate arms up to pronate his forearms 4) Step and reach backwards , cues for BUE back, getting toe up and bending back knee 5) Step and reach sideways, cues to turn head sideways, and turn head back to neutral, and to rotate arms and pronate forearms 6) Rock and reach forward/backward , cues to reach far fwd with UE's, patient not comfortable with feet apart, not rocking very far to get a good weight shift, not able to raise heel or raise toes much 7) Rock and reach sideways, cues for twist and look behind, only rotates side ways, unable to twist around or turn to look behind  functional component task with supervision 5 reps and simulated activities for: 1. Sit to standx 10needs cues to begin with UE up for correct start position 2.scooting his chair close to  the tablex 5  3.tapping a 6 inch stool 10 4.dealing cards/ pegs in a board for small coordination of UE to assist with turning pages in a book 5. Gettinghis feeton and off a 6 inch stoolx 5  Big walk with arm swing 1000 feet x 2 Patient performed with instruction, verbal cues, tactile cues of therapist: goal:increase tissue extensibility, promote proper posture, improve mobility Pt educated throughout session about proper posture and technique with exercises. Improved exercise technique, movement at target joints, use of target muscles after min to mod verbal, visual, tactile cues.                         PT Education -  07/06/20 1552    Education Details LSVT BIG    Person(s) Educated Patient    Methods Explanation    Comprehension Verbalized understanding            PT Short Term Goals - 06/13/20 1509      PT SHORT TERM GOAL #1   Title Patient will be modified independent in home exercise program to improve strength/mobility for better functional independence with ADLs.    Time 2    Period Weeks    Status New    Target Date 06/27/20             PT Long Term Goals - 07/05/20 1439      PT LONG TERM GOAL #1   Title Patient will reduce timed up and go to <11 seconds to reduce fall risk and demonstrate improved transfer/gait ability.    Baseline 07/05/20= 6.48, cog 10.02, carry 10.96    Time 4    Period Weeks    Status Partially Met    Target Date 07/11/20      PT LONG TERM GOAL #2   Title Patient will be independent in home exercise program to improve strength/mobility for better functional independence with ADLs.    Time 4    Period Weeks    Status Partially Met    Target Date 07/11/20      PT LONG TERM GOAL #3   Title Patient will increase Berg Balance score by > 6 points to demonstrate decreased fall risk during functional activities.    Baseline 07/05/20= 47/56    Time 4    Period Weeks    Status Partially Met    Target Date 07/11/20      PT LONG TERM GOAL #4   Title Patient will improve FOTO score to indicate improved functional mobility and safety    Baseline 07/05/20=73    Time 4    Period Weeks    Status Partially Met    Target Date 07/11/20                 Plan - 07/06/20 1553    Clinical Impression Statement Patient has better stepping pattern with less stopping and better posture. Patient has difficulty with finishing BIG and needs extra cuing to perform exercises with correct amplitude and speed. Patient has difficulty with turning his head and rotating trunk with weight shifting exercises.  Patient has fatigue with standing exercises and needs constant VC  to have correct posture. Patient has loss of balance and needs UE support intermittently thorough out exercise. Patient has slowness of movement during rotation and beginning movements.   Stability/Clinical Decision Making Stable/Uncomplicated    Rehab Potential Good    PT Frequency 4x / week    PT Duration  4 weeks    PT Treatment/Interventions Therapeutic activities;Neuromuscular re-education;Balance training;Therapeutic exercise;Patient/family education;Gait training    Consulted and Agree with Plan of Care Patient           Patient will benefit from skilled therapeutic intervention in order to improve the following deficits and impairments:  Abnormal gait, Impaired flexibility, Difficulty walking, Decreased mobility, Decreased balance, Decreased coordination, Decreased activity tolerance, Decreased endurance, Decreased strength  Visit Diagnosis: Difficulty in walking, not elsewhere classified  Muscle weakness (generalized)     Problem List Patient Active Problem List   Diagnosis Date Noted  . Adenoma of large intestine 02/01/2016  . Clinical depression 02/01/2016  . 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 Benign essential HTN 02/01/2016  . 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/2016  . Pure hypercholesterolemia 02/01/2016  . Type 2 diabetes mellitus ([place]) 01/10/2016  . Microalbuminuria 01/10/2016  . Degeneration of intervertebral disc of lumbar region 07/13/2015  . Parkinson's disease ([place]) 11/16/2012    Alanson Puls, PT DPT 07/06/2020, 3:53 PM  [place] MAIN [facility] SERVICES [address] [place], [place], [address] Phone: [phone]   Fax:  [phone]  Name: OLIVIA ROYSE MRN: [id number] Date of Birth: [birth year]

## 2020-07-10 ENCOUNTER — Other Ambulatory Visit: Payer: Self-pay

## 2020-07-10 ENCOUNTER — Encounter: Payer: Self-pay | Admitting: Physical Therapy

## 2020-07-10 ENCOUNTER — Ambulatory Visit: Payer: PPO | Attending: Neurology | Admitting: Physical Therapy

## 2020-07-10 DIAGNOSIS — R262 Difficulty in walking, not elsewhere classified: Secondary | ICD-10-CM

## 2020-07-10 DIAGNOSIS — M6281 Muscle weakness (generalized): Secondary | ICD-10-CM

## 2020-07-10 NOTE — Therapy (Signed)
[place] MAIN [facility] SERVICES [address] [place], [place], [address] Phone: [phone]   Fax:  [phone]  Physical Therapy Treatment  Patient Details  Name: Randall [name] MRN: [id number] Date of Birth: [birth year] Referring Provider (PT): [place] Books   Encounter Date: 07/10/2020   PT End of Session - 07/10/20 1405    Visit Number 12    Number of Visits 17    Date for PT Re-Evaluation 07/25/20    PT Start Time 1400    PT Stop Time 1500    PT Time Calculation (min) 60 min    Equipment Utilized During Treatment Gait belt    Activity Tolerance Patient tolerated treatment well    Behavior During Therapy WFL for tasks assessed/performed           Past Medical History:  Diagnosis Date  . Allergic   . Colon adenoma   . Depression   . Diabetes mellitus, type II ([place])   . Diverticulosis   . Hypertension   . Seizures ([place])   . Sleep apnea   . Urinary hesitancy     Past Surgical History:  Procedure Laterality Date  . COLON SURGERY    . HERNIA REPAIR    . UVULOPALATOPHARYNGOPLASTY, TONSILLECTOMY AND SEPTOPLASTY      There were no vitals filed for this visit.   Subjective Assessment - 07/10/20 1404    Subjective Patient is doing well today .    Pertinent History Patient has had parkinsons disease 10-12 years. He had LSVT BIG treatment several years ago and now is having decreased balance and some falls. He is going to be using a RW for ambulation.    Currently in Pain? No/denies    Pain Score 0-No pain    Pain Onset In the past 7 days            Treatment: Patient seen for LSVT Daily Session Maximal Daily Exercises for facilitation/coordination of movement Sustained movements are designed to rescale the amplitude of movement output for generalization to daily functional activities .Performed as follows for 1 set of 10 repetitions each multidirectional sustained movements  1) Floor to ceiling , cues to hold for 10  seconds,needs modeling for correct positions, VC to reach out furtherand to reach up to the ceiling 2) Side to side multidirectional Repetitive movements performed in sittingand are designed to provide retraining effort needed for sustained muscle activation in tasks, cues to lean fwd and hold position x 10 counts: Patient does not move fwd<>sidesitting<> to fwd and is not able to transition or stretch out leg very far 3) Step and reach forward , cues for good knee flex, cues to move UE and LE together, cues to rotate arms up to pronate his forearms 4) Step and reach backwards , cues for BUE back, getting toe up and bending back knee 5) Step and reach sideways, cues to turn head sideways, and turn head back to neutral, and to rotate arms and pronate forearms 6) Rock and reach forward/backward , cues to reach far fwd with UE's, patient not comfortable with feet apart, not rocking very far to get a good weight shift, not able to raise heel or raise toes much 7) Rock and reach sideways, cues for twist and look behind, only rotates side ways, unable to twist around or turn to look behind  functional component task with supervision 5 reps and simulated activities for: 1. Sit to standx 10needs cues to begin with UE up  for correct start position 2.scooting his chair close to the tablex 5  3.tapping a 6 inch stool 10 4.dealing cards/ pegs in a board for small coordination of UE to assist with turning pages in a book 5. Gettinghis feeton and off a 6 inch stoolx 5  Big walk with arm swing 1000 feet x 2 Patient performed with instruction, verbal cues, tactile cues of therapist: goal:increase tissue extensibility, promote proper posture, improve mobility Pt educated throughout session about proper posture and technique with exercises. Improved exercise technique, movement at target joints, use of target muscles after min to mod verbal, visual, tactile  cues.                          PT Education - 07/10/20 1405    Education Details LSVT BIG    Person(s) Educated Patient    Methods Explanation    Comprehension Verbalized understanding            PT Short Term Goals - 06/13/20 1509      PT SHORT TERM GOAL #1   Title Patient will be modified independent in home exercise program to improve strength/mobility for better functional independence with ADLs.    Time 2    Period Weeks    Status New    Target Date 06/27/20             PT Long Term Goals - 07/05/20 1439      PT LONG TERM GOAL #1   Title Patient will reduce timed up and go to <11 seconds to reduce fall risk and demonstrate improved transfer/gait ability.    Baseline 07/05/20= 6.48, cog 10.02, carry 10.96    Time 4    Period Weeks    Status Partially Met    Target Date 07/11/20      PT LONG TERM GOAL #2   Title Patient will be independent in home exercise program to improve strength/mobility for better functional independence with ADLs.    Time 4    Period Weeks    Status Partially Met    Target Date 07/11/20      PT LONG TERM GOAL #3   Title Patient will increase Berg Balance score by > 6 points to demonstrate decreased fall risk during functional activities.    Baseline 07/05/20= 47/56    Time 4    Period Weeks    Status Partially Met    Target Date 07/11/20      PT LONG TERM GOAL #4   Title Patient will improve FOTO score to indicate improved functional mobility and safety    Baseline 07/05/20=73    Time 4    Period Weeks    Status Partially Met    Target Date 07/11/20                 Plan - 07/10/20 1405    Clinical Impression Statement Patient needs cueing for BIG swing and BIG amplitude.  Patient needs cueing for correct BIG arm swing. Patient improves with practice. Patient needs cuing to consistently swing her arms and also swing reciprocally. Patient needs cuing to swing UE's reciprocally with weight shift  exercise and with backward stepping and correct UE positioning.    Stability/Clinical Decision Making Stable/Uncomplicated    Rehab Potential Good    PT Frequency 4x / week    PT Duration 4 weeks    PT Treatment/Interventions Therapeutic activities;Neuromuscular re-education;Balance training;Therapeutic exercise;Patient/family education;Gait training  Consulted and Agree with Plan of Care Patient           Patient will benefit from skilled therapeutic intervention in order to improve the following deficits and impairments:  Abnormal gait, Impaired flexibility, Difficulty walking, Decreased mobility, Decreased balance, Decreased coordination, Decreased activity tolerance, Decreased endurance, Decreased strength  Visit Diagnosis: Difficulty in walking, not elsewhere classified  Muscle weakness (generalized)     Problem List Patient Active Problem List   Diagnosis Date Noted  . Adenoma of large intestine 02/01/2016  . Clinical depression 02/01/2016  . 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 Benign essential HTN 02/01/2016  . 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/2016  . Pure hypercholesterolemia 02/01/2016  . Type 2 diabetes mellitus ([place]) 01/10/2016  . Microalbuminuria 01/10/2016  . Degeneration of intervertebral disc of lumbar region 07/13/2015  . Parkinson's disease ([place]) 11/16/2012    Alanson Puls, PT DPT 07/10/2020, 2:06 PM  [place] MAIN [facility] SERVICES [address] [place], [place], [address] Phone: [phone]   Fax:  [phone]  Name: Randall [name] MRN: [id number] Date of Birth: Dec 20, 1950

## 2020-07-11 ENCOUNTER — Ambulatory Visit: Payer: PPO | Admitting: Physical Therapy

## 2020-07-11 ENCOUNTER — Encounter: Payer: Self-pay | Admitting: Physical Therapy

## 2020-07-11 ENCOUNTER — Other Ambulatory Visit: Payer: Self-pay

## 2020-07-11 DIAGNOSIS — R262 Difficulty in walking, not elsewhere classified: Secondary | ICD-10-CM

## 2020-07-11 DIAGNOSIS — M6281 Muscle weakness (generalized): Secondary | ICD-10-CM

## 2020-07-11 NOTE — Therapy (Signed)
[place] [facility] MAIN [facility] SERVICES [address] [place], [place], [address] Phone: [phone]   Fax:  [phone]  Physical Therapy Treatment  Patient Details  Name: Randall [name] MRN: [id number] Date of Birth: [birth year] Referring Provider (PT): Cristopher [place]   Encounter Date: 07/11/2020   PT End of Session - 07/11/20 1411    Visit Number 13    Number of Visits 17    Date for PT Re-Evaluation 07/25/20    PT Start Time 1402    PT Stop Time 1500    PT Time Calculation (min) 58 min    Equipment Utilized During Treatment Gait belt    Activity Tolerance Patient tolerated treatment well    Behavior During Therapy WFL for tasks assessed/performed           Past Medical History:  Diagnosis Date  . Allergic   . Colon adenoma   . Depression   . Diabetes mellitus, type II ([facility])   . Diverticulosis   . Hypertension   . Seizures ([facility])   . Sleep apnea   . Urinary hesitancy     Past Surgical History:  Procedure Laterality Date  . COLON SURGERY    . HERNIA REPAIR    . UVULOPALATOPHARYNGOPLASTY, TONSILLECTOMY AND SEPTOPLASTY      There were no vitals filed for this visit.   Subjective Assessment - 07/11/20 1410    Subjective Patient is having increased tremers today in his left hand and mouth.    Pertinent History Patient has had parkinsons disease 10-12 years. He had LSVT BIG treatment several years ago and now is having decreased balance and some falls. He is going to be using a RW for ambulation.    Currently in Pain? No/denies    Pain Score 0-No pain    Pain Onset In the past 7 days           Treatment: Patient seen for LSVT Daily Session Maximal Daily Exercises for facilitation/coordination of movement Sustained movements are designed to rescale the amplitude of movement output for generalization to daily functional activities .Performed as follows for 1 set of 10 repetitions each multidirectional sustained movements  1) Floor to  ceiling , cues to hold for 10 seconds,needs modeling for correct positions 2) Side to side multidirectional Repetitive movements performed in sittingand are designed to provide retraining effort needed for sustained muscle activation in tasks, cues to lean fwd and hold position x 10 counts:  3) Step and reach forward , cues for good knee flex, cues to pronate his forearms 4) Step and reach backwards , cues for BUE back, getting toe up and bending back knee 5) Step and reach sideways, cues to turn head sideways, and turn head back to neutral, and to rotate arms and pronate forearms 6) Rock and reach forward/backward , cues to reach far fwd with UE's, patient not comfortable with feet apart 7) Rock and reach sideways, cues for twist and look behind, only rotates side ways, unable to twist around or turn to look behind  functional component task with supervision 5 reps and simulated activities for: 1. Sit to standx 10needs cues to begin with UE up for correct start position 2.scooting his chair close to the tablex 5  3.tapping a 6 inch stool 10 4.dealing cards/ pegs in a board for small coordination of UE to assist with turning pages in a book 5. Gettinghis feeton and off a 6 inch stoolx 5  Big walk with arm swing  1000 feet x 2 Patient performed with instruction, verbal cues, tactile cues of therapist: goal:increase tissue extensibility, promote proper posture, improve mobility Pt educated throughout session about proper posture and technique with exercises. Improved exercise technique, movement at target joints, use of target muscles after min to mod verbal, visual, tactile cues.                           PT Education - 07/11/20 1411    Education Details LSVT BIG    Person(s) Educated Patient    Methods Explanation    Comprehension Verbalized understanding            PT Short Term Goals - 06/13/20 1509      PT SHORT TERM GOAL #1   Title  Patient will be modified independent in home exercise program to improve strength/mobility for better functional independence with ADLs.    Time 2    Period Weeks    Status New    Target Date 06/27/20             PT Long Term Goals - 07/05/20 1439      PT LONG TERM GOAL #1   Title Patient will reduce timed up and go to <11 seconds to reduce fall risk and demonstrate improved transfer/gait ability.    Baseline 07/05/20= 6.48, cog 10.02, carry 10.96    Time 4    Period Weeks    Status Partially Met    Target Date 07/11/20      PT LONG TERM GOAL #2   Title Patient will be independent in home exercise program to improve strength/mobility for better functional independence with ADLs.    Time 4    Period Weeks    Status Partially Met    Target Date 07/11/20      PT LONG TERM GOAL #3   Title Patient will increase Berg Balance score by > 6 points to demonstrate decreased fall risk during functional activities.    Baseline 07/05/20= 47/56    Time 4    Period Weeks    Status Partially Met    Target Date 07/11/20      PT LONG TERM GOAL #4   Title Patient will improve FOTO score to indicate improved functional mobility and safety    Baseline 07/05/20=73    Time 4    Period Weeks    Status Partially Met    Target Date 07/11/20                 Plan - 07/11/20 1412    Clinical Impression Statement Patient continues to demonstrates less incoordination of movement with select exercises such as rock and reach and stepping backwards. Patient responds well to verbal and tactile cues to correct form and technique. Patient is able to catch mistakes in technique with incorrect positions and is able remember the start and finish positions. Motor control of LE much improved.  Muscle fatigue but no major pain complaints.   Stability/Clinical Decision Making Stable/Uncomplicated    Rehab Potential Good    PT Frequency 4x / week    PT Duration 4 weeks    PT Treatment/Interventions  Therapeutic activities;Neuromuscular re-education;Balance training;Therapeutic exercise;Patient/family education;Gait training    Consulted and Agree with Plan of Care Patient           Patient will benefit from skilled therapeutic intervention in order to improve the following deficits and impairments:  Abnormal gait, Impaired flexibility, Difficulty walking,  Decreased mobility, Decreased balance, Decreased coordination, Decreased activity tolerance, Decreased endurance, Decreased strength  Visit Diagnosis: Difficulty in walking, not elsewhere classified  Muscle weakness (generalized)     Problem List Patient Active Problem List   Diagnosis Date Noted  . Adenoma of large intestine 02/01/2016  . Clinical depression 02/01/2016  . 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 Benign essential HTN 02/01/2016  . 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/2016  . Pure hypercholesterolemia 02/01/2016  . Type 2 diabetes mellitus ([place]) 01/10/2016  . Microalbuminuria 01/10/2016  . Degeneration of intervertebral disc of lumbar region 07/13/2015  . Parkinson's disease ([place]) 11/16/2012    Alanson Puls, PT DPT 07/11/2020, 2:13 PM  [place] MAIN [facility] SERVICES [address] [place], [place], [address] Phone: [phone]   Fax:  [phone]  Name: JAIZON DEROOS MRN: [id number] Date of Birth: [birth year]

## 2020-07-12 ENCOUNTER — Other Ambulatory Visit: Payer: Self-pay

## 2020-07-12 ENCOUNTER — Encounter: Payer: Self-pay | Admitting: Physical Therapy

## 2020-07-12 ENCOUNTER — Ambulatory Visit: Payer: PPO | Admitting: Physical Therapy

## 2020-07-12 DIAGNOSIS — M6281 Muscle weakness (generalized): Secondary | ICD-10-CM

## 2020-07-12 DIAGNOSIS — R262 Difficulty in walking, not elsewhere classified: Secondary | ICD-10-CM | POA: Diagnosis not present

## 2020-07-12 NOTE — Therapy (Addendum)
[place] MAIN [facility] SERVICES [address], [place], [address] Phone: [phone]   Fax:  [phone]  Physical Therapy Treatment  Patient Details  Name: Randall [name] MRN: [id number] Date of Birth: 19-Nov-1950 Referring Provider (PT): Jennings Books   Encounter Date: 07/12/2020   PT End of Session - 07/12/20 1416    Visit Number 14    Number of Visits 17    Date for PT Re-Evaluation 07/25/20    PT Start Time 1401    PT Stop Time 1500    PT Time Calculation (min) 59 min    Equipment Utilized During Treatment Gait belt    Activity Tolerance Patient tolerated treatment well    Behavior During Therapy WFL for tasks assessed/performed           Past Medical History:  Diagnosis Date  . Allergic   . Colon adenoma   . Depression   . Diabetes mellitus, type II ([place])   . Diverticulosis   . Hypertension   . Seizures ([place])   . Sleep apnea   . Urinary hesitancy     Past Surgical History:  Procedure Laterality Date  . COLON SURGERY    . HERNIA REPAIR    . UVULOPALATOPHARYNGOPLASTY, TONSILLECTOMY AND SEPTOPLASTY      There were no vitals filed for this visit.   Subjective Assessment - 07/12/20 1415    Subjective Patient is having increased tremers today in his left hand and mouth.    Pertinent History Patient has had parkinsons disease 10-12 years. He had LSVT BIG treatment several years ago and now is having decreased balance and some falls. He is going to be using a RW for ambulation.    Currently in Pain? No/denies    Pain Score 0-No pain    Pain Onset In the past 7 days             Treatment: Patient seen for LSVT Daily Session Maximal Daily Exercises for facilitation/coordination of movement Sustained movements are designed to rescale the amplitude of movement output for generalization to daily functional activities .Performed as follows for 1 set of 10 repetitions each multidirectional sustained movements  1) Floor  to ceiling , cues to hold for 10 seconds,needs modeling for correct positions 2) Side to side multidirectional Repetitive movements performed in sittingand are designed to provide retraining effort needed for sustained muscle activation in tasks, cues to lean fwd and hold position x 10 counts:  3) Step and reach forward , cues for good knee flex, cues to pronate his forearms 4) Step and reach backwards , cues for BUE back, getting toe up and bending back knee 5) Step and reach sideways, cues to turn head sideways, and turn head back to neutral, and to rotate arms and pronate forearms 6) Rock and reach forward/backward , cues to reach far fwd with UE's, patient not comfortable with feet apart 7) Rock and reach sideways, cues for twist and look behind, only rotates side ways, unable to twist around or turn to look behind  functional component task with supervision 5 reps and simulated activities for: 1. Sit to standx 10needs cues to begin with UE up for correct start position 2.scooting his chair close to the tablex 5  3.tapping a 6 inch stool 10 4.dealing cards/ pegs in a board for small coordination of UE to assist with turning pages in a book 5. Gettinghis feeton and off a 6 inch stoolx 5  Big walk with  arm swing 1000 feet x 2 Patient performed with instruction, verbal cues, tactile cues of therapist: goal:increase tissue extensibility, promote proper posture, improve mobility Pt educated throughout session about proper posture and technique with exercises. Improved exercise technique, movement at target joints, use of target muscles after min to mod verbal, visual, tactile cues.                         PT Education - 07/12/20 1416    Education Details LSVT BIG    Person(s) Educated Patient    Methods Explanation    Comprehension Verbalized understanding            PT Short Term Goals - 06/13/20 1509      PT SHORT TERM GOAL #1   Title  Patient will be modified independent in home exercise program to improve strength/mobility for better functional independence with ADLs.    Time 2    Period Weeks    Status New    Target Date 06/27/20             PT Long Term Goals - 07/13/20 0816      PT LONG TERM GOAL #1   Title Patient will reduce timed up and go to <11 seconds to reduce fall risk and demonstrate improved transfer/gait ability.    Baseline 07/05/20= 6.48, cog 10.02, carry 10.96    Time 4    Period Weeks    Status Partially Met    Target Date 07/20/20      PT LONG TERM GOAL #2   Title Patient will be independent in home exercise program to improve strength/mobility for better functional independence with ADLs.    Time 4    Period Weeks    Status Partially Met    Target Date 07/20/20      PT LONG TERM GOAL #3   Title Patient will increase Berg Balance score by > 6 points to demonstrate decreased fall risk during functional activities.    Baseline 07/05/20= 47/56    Time 4    Period Weeks    Status Partially Met    Target Date 07/20/20      PT LONG TERM GOAL #4   Title Patient will improve FOTO score to indicate improved functional mobility and safety    Baseline 07/05/20=73    Time 4    Period Weeks    Status Partially Met    Target Date 07/20/20                 Plan - 07/12/20 1417    Clinical Impression Statement Min cueing needed to appropriately perform LSVT tasks with leg, hand, and head position. Decreased coordination demonstrated requiring consistent verbal cueing to correct form. Cognitive understanding of task was delayed. Patient continues to demonstrate some in coordination of movement with select exercises such as rock and reach and stepping backwards. Patient responds well to verbal and tactile cues to correct form and technique.  CGA to SBA for safety with activities.  Uses to increase intensity and amplitude of movements throughout session   Stability/Clinical Decision Making  Stable/Uncomplicated    Rehab Potential Good    PT Frequency 4x / week    PT Duration 4 weeks    PT Treatment/Interventions Therapeutic activities;Neuromuscular re-education;Balance training;Therapeutic exercise;Patient/family education;Gait training    Consulted and Agree with Plan of Care Patient           Patient will benefit from skilled therapeutic intervention  in order to improve the following deficits and impairments:  Abnormal gait, Impaired flexibility, Difficulty walking, Decreased mobility, Decreased balance, Decreased coordination, Decreased activity tolerance, Decreased endurance, Decreased strength  Visit Diagnosis: Difficulty in walking, not elsewhere classified - Plan: PT plan of care cert/re-cert  Muscle weakness (generalized) - Plan: PT plan of care cert/re-cert     Problem List Patient Active Problem List   Diagnosis Date Noted  . Adenoma of large intestine 02/01/2016  . Clinical depression 02/01/2016  . 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 Benign essential HTN 02/01/2016  . 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/2016  . Pure hypercholesterolemia 02/01/2016  . Type 2 diabetes mellitus ([place]) 01/10/2016  . Microalbuminuria 01/10/2016  . Degeneration of intervertebral disc of lumbar region 07/13/2015  . Parkinson's disease ([place]) 11/16/2012    Alanson Puls, PT DPT 07/13/2020, 8:17 AM  [place] MAIN [facility] SERVICES [address] [place], [place], [address] Phone: [phone]   Fax:  [phone]  Name: CARYL MANAS MRN: [id number] Date of Birth: 03/01/1951

## 2020-07-13 ENCOUNTER — Other Ambulatory Visit: Payer: Self-pay

## 2020-07-13 ENCOUNTER — Ambulatory Visit: Payer: PPO | Admitting: Physical Therapy

## 2020-07-13 ENCOUNTER — Encounter: Payer: Self-pay | Admitting: Physical Therapy

## 2020-07-13 DIAGNOSIS — R262 Difficulty in walking, not elsewhere classified: Secondary | ICD-10-CM | POA: Diagnosis not present

## 2020-07-13 DIAGNOSIS — M6281 Muscle weakness (generalized): Secondary | ICD-10-CM

## 2020-07-13 NOTE — Addendum Note (Signed)
Addended by: Alanson Puls on: 07/13/2020 08:18 AM   Modules accepted: Orders

## 2020-07-13 NOTE — Therapy (Signed)
[place] MAIN [facility] SERVICES [address] [place], [place], [address] Phone: [phone]   Fax:  [phone]  Physical Therapy Treatment  Patient Details  Name: Randall [name] MRN: [id number] Date of Birth: April 10, 1951 Referring Provider (PT): Jennings Books   Encounter Date: 07/13/2020   PT End of Session - 07/13/20 1407    Visit Number 15    Number of Visits 17    Date for PT Re-Evaluation 07/25/20    PT Start Time 5400    PT Stop Time 1500    PT Time Calculation (min) 55 min    Equipment Utilized During Treatment Gait belt    Activity Tolerance Patient tolerated treatment well    Behavior During Therapy WFL for tasks assessed/performed           Past Medical History:  Diagnosis Date  . Allergic   . Colon adenoma   . Depression   . Diabetes mellitus, type II ([place])   . Diverticulosis   . Hypertension   . Seizures ([place])   . Sleep apnea   . Urinary hesitancy     Past Surgical History:  Procedure Laterality Date  . COLON SURGERY    . HERNIA REPAIR    . UVULOPALATOPHARYNGOPLASTY, TONSILLECTOMY AND SEPTOPLASTY      There were no vitals filed for this visit.   Subjective Assessment - 07/13/20 1406    Subjective Patient is having a better day today    Pertinent History Patient has had parkinsons disease 10-12 years. He had LSVT BIG treatment several years ago and now is having decreased balance and some falls. He is going to be using a RW for ambulation.    Currently in Pain? No/denies    Pain Score 0-No pain    Pain Onset In the past 7 days              Treatment: Patient seen for LSVT Daily Session Maximal Daily Exercises for facilitation/coordination of movement Sustained movements are designed to rescale the amplitude of movement output for generalization to daily functional activities .Performed as follows for 1 set of 10 repetitions each multidirectional sustained movements  1) Floor to ceiling , cues to hold for 10  seconds,needs modeling for correct positions 2) Side to side multidirectional Repetitive movements performed in sittingand are designed to provide retraining effort needed for sustained muscle activation in tasks, cues to lean fwd and hold position x 10 counts:  3) Step and reach forward , cues for good knee flex, cues to pronate his forearms 4) Step and reach backwards , cues for BUE back, getting toe up and bending back knee 5) Step and reach sideways, cues to turn head sideways, and turn head back to neutral, and to rotate arms and pronate forearms 6) Rock and reach forward/backward , cues to reach far fwd with UE's, patient not comfortable with feet apart 7) Rock and reach sideways, cues for twist and look behind, only rotates side ways, unable to twist around or turn to look behind  functional component task with supervision 5 reps and simulated activities for: 1. Sit to standx 10needs cues to begin with UE up for correct start position 2.scooting his chair close to the tablex 5  3.tapping a 6 inch stool 10 4.dealing cards/ pegs in a board for small coordination of UE to assist with turning pages in a book 5. Gettinghis feeton and off a 6 inch stoolx 5  Big walk with arm swing 1000 feet  x 2 Patient performed with instruction, verbal cues, tactile cues of therapist: goal:increase tissue extensibility, promote proper posture, improve mobility Pt educated throughout session about proper posture and technique with exercises. Improved exercise technique, movement at target joints, use of target muscles after min to mod verbal, visual, tactile cues.                          PT Education - 07/13/20 1407    Education Details LSVT BIG    Person(s) Educated Patient    Methods Explanation    Comprehension Verbalized understanding            PT Short Term Goals - 06/13/20 1509      PT SHORT TERM GOAL #1   Title Patient will be modified independent  in home exercise program to improve strength/mobility for better functional independence with ADLs.    Time 2    Period Weeks    Status New    Target Date 06/27/20             PT Long Term Goals - 07/13/20 0816      PT LONG TERM GOAL #1   Title Patient will reduce timed up and go to <11 seconds to reduce fall risk and demonstrate improved transfer/gait ability.    Baseline 07/05/20= 6.48, cog 10.02, carry 10.96    Time 4    Period Weeks    Status Partially Met    Target Date 07/20/20      PT LONG TERM GOAL #2   Title Patient will be independent in home exercise program to improve strength/mobility for better functional independence with ADLs.    Time 4    Period Weeks    Status Partially Met    Target Date 07/20/20      PT LONG TERM GOAL #3   Title Patient will increase Berg Balance score by > 6 points to demonstrate decreased fall risk during functional activities.    Baseline 07/05/20= 47/56    Time 4    Period Weeks    Status Partially Met    Target Date 07/20/20      PT LONG TERM GOAL #4   Title Patient will improve FOTO score to indicate improved functional mobility and safety    Baseline 07/05/20=73    Time 4    Period Weeks    Status Partially Met    Target Date 07/20/20                 Plan - 07/13/20 1408    Clinical Impression Statement Patient continues to demonstrates less incoordination of movement with select exercises such as rock and reach and stepping backwards. Patient responds well to verbal and tactile cues to correct form and technique. Patient is able to catch mistakes in technique with incorrect positions and is able remember the start and finish positions. Motor control of LE much improved.  Muscle fatigue but no major pain complaints.   Stability/Clinical Decision Making Stable/Uncomplicated    Rehab Potential Good    PT Frequency 4x / week    PT Duration 4 weeks    PT Treatment/Interventions Therapeutic activities;Neuromuscular  re-education;Balance training;Therapeutic exercise;Patient/family education;Gait training    Consulted and Agree with Plan of Care Patient           Patient will benefit from skilled therapeutic intervention in order to improve the following deficits and impairments:  Abnormal gait, Impaired flexibility, Difficulty walking, Decreased mobility, Decreased  balance, Decreased coordination, Decreased activity tolerance, Decreased endurance, Decreased strength  Visit Diagnosis: Difficulty in walking, not elsewhere classified  Muscle weakness (generalized)     Problem List Patient Active Problem List   Diagnosis Date Noted  . Adenoma of large intestine 02/01/2016  . Clinical depression 02/01/2016  . 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 Benign essential HTN 02/01/2016  . 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/2016  . Pure hypercholesterolemia 02/01/2016  . Type 2 diabetes mellitus ([place]) 01/10/2016  . Microalbuminuria 01/10/2016  . Degeneration of intervertebral disc of lumbar region 07/13/2015  . Parkinson's disease ([place]) 11/16/2012    Alanson Puls, PT DPT 07/13/2020, 2:08 PM  [place] MAIN [facility] SERVICES [address] [place], [place], [address] Phone: [phone]   Fax:  [phone]  Name: Randall [name] MRN: [id number] Date of Birth: [birth year]

## 2020-07-17 ENCOUNTER — Ambulatory Visit: Payer: PPO | Admitting: Physical Therapy

## 2020-07-17 ENCOUNTER — Other Ambulatory Visit: Payer: Self-pay

## 2020-07-17 DIAGNOSIS — R262 Difficulty in walking, not elsewhere classified: Secondary | ICD-10-CM

## 2020-07-17 DIAGNOSIS — M6281 Muscle weakness (generalized): Secondary | ICD-10-CM

## 2020-07-17 NOTE — Therapy (Signed)
[place] MAIN [facility] SERVICES [address] [place], [place], [address] Phone: [phone]   Fax:  [phone]  Patient Details  Name: Randall [name] MRN: [id number] Date of Birth: 1951-06-11 Referring Provider:  Vladimir Crofts, MD  Encounter Date: 07/17/2020 Patient fell down as he entered the building today and has scrapes and bruising needing attention. He will be rescheduled for his visit today.   [address] [place] DPT 07/17/2020, 2:25 PM  [place] MAIN [facility] SERVICES [address] [place], [place], [address] Phone: [phone]   Fax:  [phone]

## 2020-07-18 ENCOUNTER — Ambulatory Visit: Payer: PPO | Admitting: Physical Therapy

## 2020-07-19 ENCOUNTER — Ambulatory Visit: Payer: PPO | Admitting: Physical Therapy

## 2020-07-20 ENCOUNTER — Other Ambulatory Visit: Payer: Self-pay

## 2020-07-20 ENCOUNTER — Ambulatory Visit: Payer: PPO | Admitting: Physical Therapy

## 2020-07-20 ENCOUNTER — Encounter: Payer: Self-pay | Admitting: Physical Therapy

## 2020-07-20 DIAGNOSIS — M6281 Muscle weakness (generalized): Secondary | ICD-10-CM

## 2020-07-20 DIAGNOSIS — R262 Difficulty in walking, not elsewhere classified: Secondary | ICD-10-CM

## 2020-07-20 NOTE — Therapy (Signed)
[place] MAIN [facility] SERVICES [address] [place], [place], [address] Phone: [phone]   Fax:  [phone]  Physical Therapy Treatment  Patient Details  Name: Randall [name] MRN: [id number] Date of Birth: 19-Dec-1950 Referring Provider (PT): Jennings Books   Encounter Date: 07/20/2020   PT End of Session - 07/20/20 1526    Visit Number 16    Number of Visits 17    Date for PT Re-Evaluation 07/25/20    PT Start Time 1500    PT Stop Time 1600    PT Time Calculation (min) 60 min    Equipment Utilized During Treatment Gait belt    Activity Tolerance Patient tolerated treatment well    Behavior During Therapy WFL for tasks assessed/performed           Past Medical History:  Diagnosis Date   Allergic    Colon adenoma    Depression    Diabetes mellitus, type II ([place])    Diverticulosis    Hypertension    Seizures ([place])    Sleep apnea    Urinary hesitancy     Past Surgical History:  Procedure Laterality Date   COLON SURGERY     HERNIA REPAIR     UVULOPALATOPHARYNGOPLASTY, TONSILLECTOMY AND SEPTOPLASTY      There were no vitals filed for this visit.   Subjective Assessment - 07/20/20 1510    Subjective Patient is having a better day today    Pertinent History Patient has had parkinsons disease 10-12 years. He had LSVT BIG treatment several years ago and now is having decreased balance and some falls. He is going to be using a RW for ambulation.    Currently in Pain? Yes    Pain Score 3     Pain Location Knee    Pain Orientation Left    Pain Descriptors / Indicators Aching    Pain Type Acute pain    Pain Onset In the past 7 days    Pain Frequency Constant    Aggravating Factors  movement    Pain Relieving Factors ice    Multiple Pain Sites Yes           Treatment: Patient seen for LSVT Daily Session Maximal Daily Exercises for facilitation/coordination of movement Sustained movements are designed to rescale  the amplitude of movement output for generalization to daily functional activities .Performed as follows for 1 set of 10 repetitions each multidirectional sustained movements  1) Floor to ceiling , cues to hold for 10 seconds,needs modeling for correct positions 2) Side to side multidirectional Repetitive movements performed in sittingand are designed to provide retraining effort needed for sustained muscle activation in tasks, cues to lean fwd and hold position x 10 counts:  3) Step and reach forward , cues for good knee flex, cues to pronate his forearms 4) Step and reach backwards , cues for BUE back, getting toe up and bending back knee 5) Step and reach sideways, cues to turn head sideways, and turn head back to neutral, and to rotate arms and pronate forearms 6) Rock and reach forward/backward , cues to reach far fwd with UE's, patient not comfortable with feet apart 7) Rock and reach sideways, cues for twist and look behind, only rotates side ways, unable to twist around or turn to look behind  functional component task with supervision 5 reps and simulated activities for: 1. Sit to standx 10needs cues to begin with UE up for correct start position 2.scooting  his chair close to the tablex 5  3.tapping a 6 inch stool 10 4.dealing cards/ pegs in a board for small coordination of UE to assist with turning pages in a book 5. Gettinghis feeton and off a 6 inch stool to mimic getting his feet into the carx 5  Big walk with arm swing 1000 feet x 2 Patient performed with instruction, verbal cues, tactile cues of therapist: goal:increase tissue extensibility, promote proper posture, improve mobility Pt educated throughout session about proper posture and technique with exercises. Improved exercise technique, movement at target joints, use of target muscles after min to mod verbal, visual, tactile cues.                           PT Education - 07/20/20  1511    Education Details LSVT BIG    Person(s) Educated Patient    Methods Explanation    Comprehension Verbalized understanding;Need further instruction            PT Short Term Goals - 06/13/20 1509      PT SHORT TERM GOAL #1   Title Patient will be modified independent in home exercise program to improve strength/mobility for better functional independence with ADLs.    Time 2    Period Weeks    Status New    Target Date 06/27/20             PT Long Term Goals - 07/13/20 0816      PT LONG TERM GOAL #1   Title Patient will reduce timed up and go to <11 seconds to reduce fall risk and demonstrate improved transfer/gait ability.    Baseline 07/05/20= 6.48, cog 10.02, carry 10.96    Time 4    Period Weeks    Status Partially Met    Target Date 07/20/20      PT LONG TERM GOAL #2   Title Patient will be independent in home exercise program to improve strength/mobility for better functional independence with ADLs.    Time 4    Period Weeks    Status Partially Met    Target Date 07/20/20      PT LONG TERM GOAL #3   Title Patient will increase Berg Balance score by > 6 points to demonstrate decreased fall risk during functional activities.    Baseline 07/05/20= 47/56    Time 4    Period Weeks    Status Partially Met    Target Date 07/20/20      PT LONG TERM GOAL #4   Title Patient will improve FOTO score to indicate improved functional mobility and safety    Baseline 07/05/20=73    Time 4    Period Weeks    Status Partially Met    Target Date 07/20/20                 Plan - 07/20/20 1527    Clinical Impression Statement Continues to have balance deficits typical with diagnosis. Patient performs intermediate level exercises without pain behaviors and needs verbal cuing for postural alignment and head positioning. Cuing is needed to stretch the leg out in seated side reaching. Fatigue with sit to stand but demonstrating more control. Patient continues to  demonstrates less incoordination of movement with select exercises such as rock and reach and stepping backwards. Patient responds well to verbal and tactile cues to correct form and technique..    Stability/Clinical Decision Making Stable/Uncomplicated    Rehab  Potential Good    PT Frequency 4x / week    PT Duration 4 weeks    PT Treatment/Interventions Therapeutic activities;Neuromuscular re-education;Balance training;Therapeutic exercise;Patient/family education;Gait training    Consulted and Agree with Plan of Care Patient           Patient will benefit from skilled therapeutic intervention in order to improve the following deficits and impairments:  Abnormal gait, Impaired flexibility, Difficulty walking, Decreased mobility, Decreased balance, Decreased coordination, Decreased activity tolerance, Decreased endurance, Decreased strength  Visit Diagnosis: Difficulty in walking, not elsewhere classified  Muscle weakness (generalized)     Problem List Patient Active Problem List   Diagnosis Date Noted   Adenoma of large intestine 02/01/2016   Clinical depression 02/01/2016   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   Benign essential HTN 02/01/2016   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/2016   Pure hypercholesterolemia 02/01/2016   Type 2 diabetes mellitus ([place]) 01/10/2016   Microalbuminuria 01/10/2016   Degeneration of intervertebral disc of lumbar region 07/13/2015   Parkinson's disease ([place]) 11/16/2012    Alanson Puls PT DPT 07/20/2020, 3:33 PM  [place] MAIN [facility] SERVICES [address] [place], [place], [address] Phone: [phone]   Fax:  [phone]  Name: Randall [name] MRN: [id number] Date of Birth: 1951/03/13

## 2020-07-24 ENCOUNTER — Encounter: Payer: Self-pay | Admitting: Physical Therapy

## 2020-07-24 ENCOUNTER — Ambulatory Visit: Payer: PPO | Admitting: Physical Therapy

## 2020-07-24 ENCOUNTER — Other Ambulatory Visit: Payer: Self-pay

## 2020-07-24 DIAGNOSIS — R262 Difficulty in walking, not elsewhere classified: Secondary | ICD-10-CM

## 2020-07-24 DIAGNOSIS — M6281 Muscle weakness (generalized): Secondary | ICD-10-CM

## 2020-07-24 NOTE — Therapy (Signed)
[place] MAIN [facility] SERVICES [address] [place], [place], [address] Phone: [phone]   Fax:  [phone]  Physical Therapy Treatment  Patient Details  Name: Randall [name] MRN: [id number] Date of Birth: [birth year] Referring Provider (PT): Jennings Books   Encounter Date: 07/24/2020   PT End of Session - 07/24/20 1629    Visit Number 17    Number of Visits 17    Date for PT Re-Evaluation 07/25/20    Equipment Utilized During Treatment Gait belt    Activity Tolerance Patient tolerated treatment well    Behavior During Therapy [facility] for tasks assessed/performed           Past Medical History:  Diagnosis Date  . Allergic   . Colon adenoma   . Depression   . Diabetes mellitus, type II ([place])   . Diverticulosis   . Hypertension   . Seizures ([place])   . Sleep apnea   . Urinary hesitancy     Past Surgical History:  Procedure Laterality Date  . COLON SURGERY    . HERNIA REPAIR    . UVULOPALATOPHARYNGOPLASTY, TONSILLECTOMY AND SEPTOPLASTY      There were no vitals filed for this visit.   Subjective Assessment - 07/24/20 1625    Subjective Patient is having a better day today.    Pertinent History Patient has had parkinsons disease 10-12 years. He had LSVT BIG treatment several years ago and now is having decreased balance and some falls. He is going to be using a RW for ambulation.    Currently in Pain? No/denies    Pain Score 0-No pain    Pain Onset In the past 7 days              [facility] PT Assessment - 07/24/20 0001      Berg Balance Test   Sit to Stand Able to stand without using hands and stabilize independently    Standing Unsupported Able to stand safely 2 minutes    Sitting with Back Unsupported but Feet Supported on Floor or Stool Able to sit safely and securely 2 minutes    Stand to Sit Sits safely with minimal use of hands    Transfers Able to transfer safely, minor use of hands    Standing Unsupported with Eyes  Closed Able to stand 10 seconds safely    Standing Unsupported with Feet Together Able to place feet together independently and stand 1 minute safely    From Standing, Reach Forward with Outstretched Arm Can reach confidently >25 cm (10")    From Standing Position, Pick up Object from Floor Able to pick up shoe safely and easily    From Standing Position, Turn to Look Behind Over each Shoulder Looks behind from both sides and weight shifts well    Turn 360 Degrees Able to turn 360 degrees safely in 4 seconds or less    Standing Unsupported, Alternately Place Feet on Step/Stool Able to stand independently and safely and complete 8 steps in 20 seconds    Standing Unsupported, One Foot in Front Able to take small step independently and hold 30 seconds    Standing on One Leg Able to lift leg independently and hold 5-10 seconds    Total Score 53             Treatment: Patient seen for LSVT Daily Session Maximal Daily Exercises for facilitation/coordination of movement Sustained movements are designed to rescale the amplitude of movement output for  generalization to daily functional activities .Performed as follows for 1 set of 10 repetitions each multidirectional sustained movements  1) Floor to ceiling , cues to hold for 10 seconds,needs modeling for correct positions 2) Side to side multidirectional Repetitive movements performed in sittingand are designed to provide retraining effort needed for sustained muscle activation in tasks, cues to lean fwd and hold position x 10 counts:  3) Step and reach forward , cues for good knee flex, cues to pronate his forearms 4) Step and reach backwards , cues for BUE back, getting toe up and bending back knee 5) Step and reach sideways, cues to turn head sideways, and turn head back to neutral, and to rotate arms and pronate forearms 6) Rock and reach forward/backward , cues to reach far fwd with UE's, patient not comfortable with feet apart 7) Rock and  reach sideways, cues for twist and look behind, only rotates side ways, unable to twist around or turn to look behind  functional component task with supervision 5 reps and simulated activities for: 1. Sit to standx 10needs cues to begin with UE up for correct start position 2.scooting his chair close to the tablex 5  3.tapping a 6 inch stool 10 4.dealing cards/ pegs in a board for small coordination of UE to assist with turning pages in a book 5. Gettinghis feeton and off a 6 inch stool to mimic getting his feet into the carx 5  Big walk with arm swing 1000 feet x 2 Patient performed with instruction, verbal cues, tactile cues of therapist: goal:increase tissue extensibility, promote proper posture, improve mobility Pt educated throughout session about proper posture and technique with exercises. Improved exercise technique, movement at target joints, use of target muscles after min to mod verbal, visual, tactile cues.                      PT Education - 07/24/20 1625    Education Details LSVT BIG    Person(s) Educated Patient    Methods Explanation    Comprehension Verbalized understanding            PT Short Term Goals - 06/13/20 1509      PT SHORT TERM GOAL #1   Title Patient will be modified independent in home exercise program to improve strength/mobility for better functional independence with ADLs.    Time 2    Period Weeks    Status New    Target Date 06/27/20             PT Long Term Goals - 07/24/20 1609      PT LONG TERM GOAL #1   Title Patient will reduce timed up and go to <11 seconds to reduce fall risk and demonstrate improved transfer/gait ability.    Baseline 07/05/20= 6.48, cog 10.02, carry 10.96, 07/24/20=6.58,cog 7.66 carry 8.05    Time 4    Period Weeks    Status Partially Met    Target Date 07/24/20      PT LONG TERM GOAL #2   Title Patient will be independent in home exercise program to improve  strength/mobility for better functional independence with ADLs.    Time 4    Period Weeks    Status Achieved    Target Date 07/24/20      PT LONG TERM GOAL #3   Title Patient will increase Berg Balance score by > 6 points to demonstrate decreased fall risk during functional activities.    Baseline 07/05/20=  47/56, 07/24/20=53    Time 4    Period Weeks    Status Achieved    Target Date 07/24/20      PT LONG TERM GOAL #4   Title Patient will improve FOTO score to indicate improved functional mobility and safety    Baseline 07/05/20=73    Time 4    Period Weeks    Status Achieved    Target Date 07/24/20                 Plan - 07/24/20 1631    Clinical Impression Statement Patient continues to demonstrates less incoordination of movement with select exercises such as rock and reach and stepping backwards. Patient responds well to verbal and tactile cues to correct form and technique. Patient is able to catch mistakes in technique with incorrect positions and is able remember the start and finish positions. Motor control of LE much improved.  Muscle fatigue but no major pain complaints.Patient has demonstrated improved dynamic balance, gait,  mobility, and  LE strength, and  reached goals #1,2,3,4.  Patient will be discharged from skilled physical therapy to HEP.   Stability/Clinical Decision Making Stable/Uncomplicated    Rehab Potential Good    PT Frequency 4x / week    PT Duration 4 weeks    PT Treatment/Interventions Therapeutic activities;Neuromuscular re-education;Balance training;Therapeutic exercise;Patient/family education;Gait training    Consulted and Agree with Plan of Care Patient           Patient will benefit from skilled therapeutic intervention in order to improve the following deficits and impairments:  Abnormal gait, Impaired flexibility, Difficulty walking, Decreased mobility, Decreased balance, Decreased coordination, Decreased activity tolerance, Decreased  endurance, Decreased strength  Visit Diagnosis: Muscle weakness (generalized)  Difficulty in walking, not elsewhere classified     Problem List Patient Active Problem List   Diagnosis Date Noted  . Adenoma of large intestine 02/01/2016  . Clinical depression 02/01/2016  . 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 Benign essential HTN 02/01/2016  . 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/2016  . Pure hypercholesterolemia 02/01/2016  . Type 2 diabetes mellitus ([place]) 01/10/2016  . Microalbuminuria 01/10/2016  . Degeneration of intervertebral disc of lumbar region 07/13/2015  . Parkinson's disease ([place]) 11/16/2012    Alanson Puls, PT DPT 07/24/2020, 4:37 PM  [place] MAIN [facility] SERVICES [address] [place], [place], [address] Phone: [phone]   Fax:  [phone]  Name: Randall [name] MRN: [id number] Date of Birth: Aug 22, 1951

## 2020-07-28 ENCOUNTER — Telehealth: Payer: Self-pay

## 2020-07-28 DIAGNOSIS — F5104 Psychophysiologic insomnia: Secondary | ICD-10-CM | POA: Diagnosis not present

## 2020-07-28 DIAGNOSIS — F0280 Dementia in other diseases classified elsewhere without behavioral disturbance: Secondary | ICD-10-CM | POA: Diagnosis not present

## 2020-07-28 DIAGNOSIS — G20 Parkinson's disease: Secondary | ICD-10-CM | POA: Diagnosis not present

## 2020-07-28 DIAGNOSIS — R2689 Other abnormalities of gait and mobility: Secondary | ICD-10-CM | POA: Diagnosis not present

## 2020-07-28 DIAGNOSIS — G4733 Obstructive sleep apnea (adult) (pediatric): Secondary | ICD-10-CM | POA: Diagnosis not present

## 2020-07-28 NOTE — Telephone Encounter (Signed)
12:03AM: Palliative care SW outreached patient to complete telephonic visit.   Palliative care SW outreached patient to complete telephonic visit. Patient's wife, Georgina Snell, provided quick update on medical condition and/or changes. Wife shared that patient has been doing well and recently completed outpatient therapy this past Monday. No medication changes. Patient eating well. No other psychosocial needs. Patient appreciative of telephonic check in.   Plan: Palliative care will continue to monitor and assist with long term care planning as needed. In hoe visit scheduled for Mon 12/6 @2pm .

## 2020-08-14 ENCOUNTER — Other Ambulatory Visit: Payer: Self-pay

## 2020-08-14 ENCOUNTER — Other Ambulatory Visit: Payer: PPO

## 2020-08-14 DIAGNOSIS — Z515 Encounter for palliative care: Secondary | ICD-10-CM

## 2020-08-14 NOTE — Progress Notes (Signed)
COMMUNITY PALLIATIVE CARE SW NOTE  PATIENT NAME: Randall [name] DOB: [birth year] MRN: [id number]  PRIMARY CARE PROVIDER: Adin Hector, MD  RESPONSIBLE PARTY:  Acct ID - Guarantor Home Phone Work Phone Relationship Acct Type  [id number] - Randall [name],Randall [name] [phone]  Self P/F     [place], [place], [place] [address]     PLAN OF CARE and INTERVENTIONS:             1. GOALS OF CARE/ ADVANCE CARE PLANNING:  Patient is a FULL CODE. Goal is to remain at home and be as independent as possible.  2. SOCIAL/EMOTIONAL/SPIRITUAL ASSESSMENT/ INTERVENTIONS:  SW met with patient and Randall [name] (patient's wife) in the home for scheduled in home visit. Patient and wife updated SW of recent medical changes/updates. Patient recently completed outpatient PT. Patient saw neuro for routine follow up, no medication changes. Patient denies current pain. Patient reports that he is sleeping is okay, states that sometimes he cant get sleep as soon as he lay down, he has medication to assist. Patient eats well with no loss of appetite. Patient continues to experience tremors but states that the new Parkinson's medications Kynmobi which is helping his tremors when he takes it. Patient did express frustration with tremors and now has begun having more tremors in his moth/jaw. Patient is no longer driving due to tremors. Patient shared that he is driving short distances and only when he feels good. Patient has begun using a weekly pill box, that he fills himself, and states that it has been very helpful. Patient enjoys karaoke and has machine in his home and also goes to  a friend home who has a Psychologist, clinical. Patient and wife are scheduled to receive their booster shots next week. SW discussed care and goals. Patient and wife and no other concerns or needs of palliative care team at this time. Patient is doing well overall. Palliative care will continue to follow. 3. PATIENT/CAREGIVER EDUCATION/ COPING: Patient is alert, engaged in  conversation. Patient was pleasant, jovial and in good spirits. Patient openly expresses feelings with SW. Patient is positive about his situation and tries to maintain his outlook. Patient shared that he enjoys singing Felisa Bonier and has a Estate agent in the home. Patient acknowledges that he has a great support system between friends and family. 4. PERSONAL EMERGENCY PLAN: Patient will call 9-1-1 for emergencies.  5. COMMUNITY RESOURCES COORDINATION/ HEALTH CARE NAVIGATION:  Wife assist with care needs.  6. FINANCIAL/LEGAL CONCERNS/INTERVENTIONS:  None.      SOCIAL HX:  Social History   Tobacco Use  . Smoking status: Never Smoker  . Smokeless tobacco: Never Used  Substance Use Topics  . Alcohol use: Yes    Alcohol/week: 1.0 standard drink    Types: 1 Cans of beer per week    Comment: socially     CODE STATUS: Full code ADVANCED DIRECTIVES: N MOST FORM COMPLETE:  Y HOSPICE EDUCATION PROVIDED: N  KCL:EXNTZGY is ambulatory without AD. patient has RW and [name] is needed. Patient is independent with all ADL's. Wife present if needed. Patient still drives only short distances.  Time spent: 1Hr 9min      [name], [place]

## 2020-09-26 ENCOUNTER — Telehealth: Payer: Self-pay

## 2020-09-26 NOTE — Telephone Encounter (Signed)
11:30AM: Palliative care SW outreached patient to complete telephonic visit.   Palliative care SW outreached patient to complete telephonic visit. Patient's wife provided update on medical condition and/or changes. Wife shared that patient has been doing well. No declines that she can tell. Patient is having more mouth tremors in between medication and is taking Kynmobi which is effective. No recent falls. Patients appetite is the same and  is eating good. No weight loss. Denies any signs of pain. Denies financial, food insecurities or issues with transportation. No other psychosocial needs. Wife and patient appreciative of telephonic check in. Next in home visit scheduled for 2/3 @1pm .  Palliative care will continue to monitor and assist with long term care planning as needed.

## 2020-10-12 ENCOUNTER — Other Ambulatory Visit: Payer: Self-pay

## 2020-10-12 ENCOUNTER — Other Ambulatory Visit: Payer: PPO

## 2020-10-12 VITALS — BP 132/92 | HR 72 | Temp 97.7°F | Resp 18

## 2020-10-12 DIAGNOSIS — Z515 Encounter for palliative care: Secondary | ICD-10-CM

## 2020-10-12 NOTE — Progress Notes (Signed)
PATIENT NAME: Randall [name] DOB: 04-07-1951 MRN: [id number]  PRIMARY CARE PROVIDER: Adin Hector, MD  RESPONSIBLE PARTY:  Acct ID - Guarantor Home Phone Work Phone Relationship Acct Type  [id number] - Sabado,Donell B [phone]  Self P/F     [place], [place], [place] [address]    PLAN OF CARE and INTERVENTIONS:               1.  GOALS OF CARE/ ADVANCE CARE PLANNING:  Remain home and independent under the care of his wife.               2.  PATIENT/CAREGIVER EDUCATION:  Dysphagia, Aspiration Pneumonia,                4. PERSONAL EMERGENCY PLAN:  Activiate 911 for emergencies.               5.  DISEASE STATUS:  Joint visit [place], [place] completed with patient and wife.  Patient reports 2 falls in the last month.  No injuries were reported.   Patient discussed his CPAP.  He is not currently using his CPAP and acknowledges that it has been years since he was tested.  Advised patient he would likely need a more recent test given the length of time it has been since his last test.    Patient reports coughing.  He notes this is more frequent at meal times.  He is currently using atropine to dry up excessive secretions.  Patient was previously followed by speech therapy and participated in exercises.  He is encouraged to begin these exercises again to strengthen swallowing muscles. Education provided on dysphagia and aspiration pneumonia.  Should this worsen, he is advised to follow up with his PCP.  Patient and wife have noted some new "twitches" to patient's head and mouth.  Patient states he does not typically realize them unless his wife tells him.  Re-enforced taking medications on a scheduled routine to maximize benefits.  He is taking Kynmobi as needed and reports this is helpful sometimes.  Blood sugars are being checked routinely 3x weekly.  Last reading was 89.   HISTORY OF PRESENT ILLNESS:  70 year old male with Parkinson's Disease.  Patient is being followed by Palliative  Care monthly and PRN.  CODE STATUS: Full ADVANCED DIRECTIVES: No MOST FORM: Yes PPS: 60%   PHYSICAL EXAM:   VITALS: Today's Vitals   10/12/20 1301  BP: (!) 132/92  Pulse: 72  Resp: 18  Temp: 97.7 F (36.5 C)  SpO2: 98%    LUNGS: CTA CARDIAC: HRR EXTREMITIES: no edema SKIN: warm and dry to touch.  No skin breakdown.  NEURO: alert and oriented x 3       Lorenza Burton, RN

## 2020-10-12 NOTE — Progress Notes (Signed)
COMMUNITY PALLIATIVE CARE SW NOTE  PATIENT NAME: Randall [name] DOB: [birth year] MRN: [id number]  PRIMARY CARE PROVIDER: Adin Hector, MD  RESPONSIBLE PARTY:  Acct ID - Guarantor Home Phone Work Phone Relationship Acct Type  [id number] - Randall [name] [phone]  Self P/F     [name], [name], [name] [address]     PLAN OF CARE and INTERVENTIONS:             1. GOALS OF CARE/ ADVANCE CARE PLANNING:  Patient is a FULL CODE. Goal is to remain at home and be as independent as possible.  2.         SOCIAL/EMOTIONAL/SPIRITUAL ASSESSMENT/ INTERVENTIONS:  SW met with patient and Randall [name] (patient's wife) in the home for scheduled in home visit. Patient and wife updated SW of recent medical changes/updates.   No recent doctors appointments, no medication changes. Will see Dr. Nehemiah [name] in April for Actinic keratosis. Patient is scheduled to see podiatrist, Dr. Lavonna [name] 2/7. Patient denies current pain. Patient reports 2 recent falls, no injuries sustained just soreness.  Patient reports that he is sleeping is okay, states that sometimes he can't get sleep as soon as he lay down, he has medication to assist. Patient shared with SW and RN that he would be interested in  a new BiPAP machine for his sleep apnea. Patient has had his current machine for years. Patient made aware that he would have to have a new sleep study ordered and done.  Patient eats well wife has noticed that patient is not eating as much. Patient mentioned having coughing spells while eating. Patient encouraged to utilize SLP handouts that he was given from outpatient therapy. Patient utilizes Child psychotherapist and speaking to Alexa to engage his speech.    Patient continues to experience tremors but states that the new Parkinson's medications Kynmobi which is helping his tremors when he takes it. Patient did express frustration with tremors and now has begun having more tremors in his moth/jaw and a slight twitch with his head. SW discussed and  encouraged parkinsons support groups with patient and wife.  SW discussed care and goals. Patient and wife and no other concerns or needs of palliative care team at this time. Patient is doing well overall. Palliative care will continue to follow.  3.         PATIENT/CAREGIVER EDUCATION/ COPING: Patient is alert, engaged in conversation. Patient was pleasant, jovial and in good spirits. Patient openly expresses feelings with SW. Patient is positive about his situation and tries to maintain his outlook. Patient shared that he enjoys singing Felisa Bonier and has a Estate agent in the home. Patient acknowledges that he has a great support system between friends and family.  4.         PERSONAL EMERGENCY PLAN: Patient will call 9-1-1 for emergencies.   5.         COMMUNITY RESOURCES COORDINATION/ HEALTH CARE NAVIGATION:  Wife assist with care needs.   6.         FINANCIAL/LEGAL CONCERNS/INTERVENTIONS:  None.        SOCIAL HX:  Social History   Tobacco Use  . Smoking status: Never Smoker  . Smokeless tobacco: Never Used  Substance Use Topics  . Alcohol use: Yes    Alcohol/week: 1.0 standard drink    Types: 1 Cans of beer per week    Comment: socially     CODE STATUS: Full Code ADVANCED DIRECTIVES: Y MOST FORM COMPLETE:  Y HOSPICE EDUCATION PROVIDED: N  PPS: Patient ambulatory w/o AD, but has SPC and RW if needed. Patient is independent with ADL's. Continent of bowel and bladder. Patient is a fall risk due to parkinson.    Time spent: 1hr.      Randall Eland, LCSW

## 2020-10-23 DIAGNOSIS — B351 Tinea unguium: Secondary | ICD-10-CM | POA: Diagnosis not present

## 2020-10-23 DIAGNOSIS — E1142 Type 2 diabetes mellitus with diabetic polyneuropathy: Secondary | ICD-10-CM | POA: Diagnosis not present

## 2020-10-23 DIAGNOSIS — L851 Acquired keratosis [keratoderma] palmaris et plantaris: Secondary | ICD-10-CM | POA: Diagnosis not present

## 2020-11-20 ENCOUNTER — Telehealth: Payer: Self-pay

## 2020-11-20 DIAGNOSIS — E7800 Pure hypercholesterolemia, unspecified: Secondary | ICD-10-CM | POA: Diagnosis not present

## 2020-11-20 DIAGNOSIS — M5136 Other intervertebral disc degeneration, lumbar region: Secondary | ICD-10-CM | POA: Diagnosis not present

## 2020-11-20 DIAGNOSIS — Z125 Encounter for screening for malignant neoplasm of prostate: Secondary | ICD-10-CM | POA: Diagnosis not present

## 2020-11-20 DIAGNOSIS — E118 Type 2 diabetes mellitus with unspecified complications: Secondary | ICD-10-CM | POA: Diagnosis not present

## 2020-11-20 NOTE — Telephone Encounter (Signed)
1249 pm.  Phone call made to patient to schedule a home visit.  No answer but message has been left requesting a call back.  PLAN:  Awaiting call back.  If no call back, PC will outreach patient at a later date.

## 2020-11-27 DIAGNOSIS — R260 Ataxic gait: Secondary | ICD-10-CM | POA: Diagnosis not present

## 2020-11-27 DIAGNOSIS — M5136 Other intervertebral disc degeneration, lumbar region: Secondary | ICD-10-CM | POA: Diagnosis not present

## 2020-11-27 DIAGNOSIS — G20 Parkinson's disease: Secondary | ICD-10-CM | POA: Diagnosis not present

## 2020-11-27 DIAGNOSIS — F0280 Dementia in other diseases classified elsewhere without behavioral disturbance: Secondary | ICD-10-CM | POA: Diagnosis not present

## 2020-11-27 DIAGNOSIS — D649 Anemia, unspecified: Secondary | ICD-10-CM | POA: Diagnosis not present

## 2020-11-27 DIAGNOSIS — G720 Drug-induced myopathy: Secondary | ICD-10-CM | POA: Diagnosis not present

## 2020-11-27 DIAGNOSIS — E1169 Type 2 diabetes mellitus with other specified complication: Secondary | ICD-10-CM | POA: Diagnosis not present

## 2020-11-27 DIAGNOSIS — I10 Essential (primary) hypertension: Secondary | ICD-10-CM | POA: Diagnosis not present

## 2020-11-27 DIAGNOSIS — E118 Type 2 diabetes mellitus with unspecified complications: Secondary | ICD-10-CM | POA: Diagnosis not present

## 2020-11-27 DIAGNOSIS — F3340 Major depressive disorder, recurrent, in remission, unspecified: Secondary | ICD-10-CM | POA: Diagnosis not present

## 2020-11-27 DIAGNOSIS — Z0000 Encounter for general adult medical examination without abnormal findings: Secondary | ICD-10-CM | POA: Diagnosis not present

## 2020-11-27 DIAGNOSIS — E7800 Pure hypercholesterolemia, unspecified: Secondary | ICD-10-CM | POA: Diagnosis not present

## 2020-11-29 ENCOUNTER — Other Ambulatory Visit: Payer: Self-pay

## 2020-11-29 ENCOUNTER — Other Ambulatory Visit: Payer: PPO

## 2020-11-29 DIAGNOSIS — Z515 Encounter for palliative care: Secondary | ICD-10-CM

## 2020-11-29 NOTE — Progress Notes (Signed)
COMMUNITY PALLIATIVE CARE SW NOTE  PATIENT NAME: Randall [name] DOB: Mar 04, 1951 MRN: [id number]  PRIMARY CARE PROVIDER: Adin Hector, MD  RESPONSIBLE PARTY:  Acct ID - Guarantor Home Phone Work Phone Relationship Acct Type  [id number] - [place]vear,Wendall B [phone]  Self P/F     [place], [place], [place] [address]     PLAN OF CARE and INTERVENTIONS:             1. GOALS OF CARE/ ADVANCE CARE PLANNING:  Patient is a FULL CODE. Goal is to remain at home and be as independent as possible.   2.         SOCIAL/EMOTIONAL/SPIRITUAL ASSESSMENT/ INTERVENTIONS:  SW met with patient and [name] (patient's wife) in the home for scheduled in home visit. Patient and wife updated SW of recent medical changes/updates.    Patient was not his normal euphoric self at beginning of visit he shared that he felt down. SW utilized first half of visit exploring patients current mood and emotions by utilizing active and reflective listening along with MI CBT modality. Patient was open about his emotions and feelings of self-inadequacies due to not being able to do things and attend social functioning's as he once did. By end of visit patient was visibly in a better mood.   Patient could benefit from on-going counseling/support in regard to his parkisnons dx. SW will share the following support info at next visit: Arman Filter [phone] [place] at [address] who speacializes in counseling support for parkinsons dx.  Patient saw Dr. Cleda Mccreedy, podiatrist, on 2/14 and had toenails trimmed. Patient saw PCP, Dr. Caryl Comes, 3/21 and was instructed to reduce metformin by 1tab for a trial period due to sugars appearing to be controlled as well as obtain weights weekly in the AM's and continue to monitor BP's and BS's, limit salt. Patient will see Dr. Nehemiah Massed, dermatology, in April for Actinic keratosis.  Patient denies current abnormal pain.   No falls reported. Patient encouraged to utilize  assistive devise for mobility due to increased fall risk.   Patient reports that he did not sleep well the night before, due to having a lot on his mind and worrying about the past. SW utilized reflective listening around this concern. Patient has sleep aid medication the he takes nightly.    Patients appetite has declined a little and he has loss 11lbs over the past six months.current weight is 170lbs.Patient continues to utilizes karaoke and speaking to Alexa to engage his speech.     Patient continues to experience tremors but states that the new Parkinson's medications Kynmobi which is helping his tremors when he takes it. Patient did express frustration with tremors and now has begun having more tremors in his moth/jaw and a slight twitch with his head. SW discussed and encouraged parkinsons support groups with patient and wife. Although his mood is a little altered due to frustrations around his physical capabilities from his parkinsons, he declines any S/S of depression and scored 0/0 on PHQ-9. SW incorporated brief SFBT modality during visit to encourage and guide client to engage more in activities that he enjoys doing.   SW discussed care and goals. Patient and wife and no other concerns or needs of palliative care team at this time. Patient is doing well overall. Palliative care will continue to follow.   3.         PATIENT/CAREGIVER EDUCATION/ COPING: Patient is alert, engaged in conversation.  Patient was pleasant, jovial and in good spirits. Patient openly expresses feelings with SW. Patient is positive about his situation and tries to maintain his outlook. Patient shared that he enjoys singing Felisa Bonier and has a Estate agent in the home. Patient acknowledges that he has a great support system between friends and family.   4.         PERSONAL EMERGENCY PLAN: Patient will call 9-1-1 for emergencies.    5.         COMMUNITY RESOURCES COORDINATION/ HEALTH CARE NAVIGATION:  Wife assist with  care needs.    6.         FINANCIAL/LEGAL CONCERNS/INTERVENTIONS:  None.          SOCIAL HX:  Social History   Tobacco Use  . Smoking status: Never Smoker  . Smokeless tobacco: Never Used  Substance Use Topics  . Alcohol use: Yes    Alcohol/week: 1.0 standard drink    Types: 1 Cans of beer per week    Comment: socially     CODE STATUS: FULL Code ADVANCED DIRECTIVES: Y MOST FORM COMPLETE: Y HOSPICE EDUCATION PROVIDED: N  PPS: Patient is independent with all ADLs' and continues to drive short distances.   Time spent: 1hr.   Doreene Eland, LCSW

## 2020-12-13 ENCOUNTER — Ambulatory Visit: Payer: PPO | Admitting: Dermatology

## 2020-12-13 ENCOUNTER — Other Ambulatory Visit: Payer: Self-pay

## 2020-12-13 DIAGNOSIS — L821 Other seborrheic keratosis: Secondary | ICD-10-CM | POA: Diagnosis not present

## 2020-12-13 DIAGNOSIS — L820 Inflamed seborrheic keratosis: Secondary | ICD-10-CM

## 2020-12-13 DIAGNOSIS — L570 Actinic keratosis: Secondary | ICD-10-CM | POA: Diagnosis not present

## 2020-12-13 DIAGNOSIS — L578 Other skin changes due to chronic exposure to nonionizing radiation: Secondary | ICD-10-CM | POA: Diagnosis not present

## 2020-12-13 NOTE — Patient Instructions (Addendum)
Recommend daily broad spectrum sunscreen SPF 30+ to sun-exposed areas, reapply every 2 hours as needed. Call for new or changing lesions.  Staying in the shade or wearing long sleeves, sun glasses (UVA+UVB protection) and wide brim hats (4-inch brim around the entire circumference of the hat) are also recommended for sun protection.   Recommend Elt Md sun protection apply to face daily    Recommend daily broad spectrum sunscreen SPF 30+ to sun-exposed areas, reapply every 2 hours as needed. Call for new or changing lesions.  Staying in the shade or wearing long sleeves, sun glasses (UVA+UVB protection) and wide brim hats (4-inch brim around the entire circumference of the hat) are also recommended for sun protection.

## 2020-12-13 NOTE — Progress Notes (Signed)
Follow-Up Visit   Subjective  Randall [name] is a 70 y.o. male who presents for the following: Follow-up (8 months f/u on Aks on the scalp ). Check at irritated spot on the face. Pt would like to know what kind of sun protection to use when he goes to the beach this summer.    The following portions of the chart were reviewed this encounter and updated as appropriate:   Tobacco  Allergies  Meds  Problems  Med Hx  Surg Hx  Fam Hx     Review of Systems:  No other skin or systemic complaints except as noted in HPI or Assessment and Plan.  Objective  Well appearing patient in no apparent distress; mood and affect are within normal limits.  A focused examination was performed including face, neck, chest and back and face,scalp . Relevant physical exam findings are noted in the Assessment and Plan.  Objective  scalp (20): Erythematous thin papules/macules with gritty scale.   Objective  R temple (2): Erythematous keratotic or waxy stuck-on papule or plaque.    Assessment & Plan  AK (actinic keratosis) (20) scalp  Destruction of lesion - scalp Complexity: simple   Destruction method: cryotherapy   Informed consent: discussed and consent obtained   Timeout:  patient name, date of birth, surgical site, and procedure verified Lesion destroyed using liquid nitrogen: Yes   Region frozen until ice ball extended beyond lesion: Yes   Outcome: patient tolerated procedure well with no complications   Post-procedure details: wound care instructions given    Inflamed seborrheic keratosis (2) R temple  Destruction of lesion - R temple Complexity: simple   Destruction method: cryotherapy   Informed consent: discussed and consent obtained   Timeout:  patient name, date of birth, surgical site, and procedure verified Lesion destroyed using liquid nitrogen: Yes   Region frozen until ice ball extended beyond lesion: Yes   Outcome: patient tolerated procedure well with no  complications   Post-procedure details: wound care instructions given     Actinic Damage - chronic, secondary to cumulative UV radiation exposure/sun exposure over time - diffuse scaly erythematous macules with underlying dyspigmentation - Recommend daily broad spectrum sunscreen SPF 30+ to sun-exposed areas, reapply every 2 hours as needed.  - Recommend staying in the shade or wearing long sleeves, sun glasses (UVA+UVB protection) and wide brim hats (4-inch brim around the entire circumference of the hat). - Call for new or changing lesions.  Seborrheic Keratoses - Stuck-on, waxy, tan-brown papules and/or plaques  - Benign-appearing - Discussed benign etiology and prognosis. - Observe - Call for any changes  Actinic Damage - Severe, confluent actinic changes with pre-cancerous actinic keratoses  - Severe, chronic, not at goal, secondary to cumulative UV radiation exposure over time - diffuse scaly erythematous macules and papules with underlying dyspigmentation - Discussed Prescription "Field Treatment" for Severe, Chronic Confluent Actinic Changes with Pre-Cancerous Actinic Keratoses Scalp, we will discuss field treatment at the next office visit.  Field treatment involves treatment of an entire area of skin that has confluent Actinic Changes (Sun/ Ultraviolet light damage) and PreCancerous Actinic Keratoses by method of PhotoDynamic Therapy (PDT) and/or prescription Topical Chemotherapy agents such as 5-fluorouracil, 5-fluorouracil/calcipotriene, and/or imiquimod.  The purpose is to decrease the number of clinically evident and subclinical PreCancerous lesions to prevent progression to development of skin cancer by chemically destroying early precancer changes that may or may not be visible.  It has been shown to reduce the risk of  developing skin cancer in the treated area. As a result of treatment, redness, scaling, crusting, and open sores may occur during treatment course. One or more  than one of these methods may be used and may have to be used several times to control, suppress and eliminate the PreCancerous changes. Discussed treatment course, expected reaction, and possible side effects. - Recommend daily broad spectrum sunscreen SPF 30+ to sun-exposed areas, reapply every 2 hours as needed.  - Staying in the shade or wearing long sleeves, sun glasses (UVA+UVB protection) and wide brim hats (4-inch brim around the entire circumference of the hat) are also recommended. - Call for new or changing lesions. Plan field treatment at follow up.  Return in about 6 months (around 06/14/2021) for Aks .  IMarye Round, CMA, am acting as scribe for Sarina Ser, MD .  Documentation: I have reviewed the above documentation for accuracy and completeness, and I agree with the above.  Sarina Ser, MD

## 2020-12-19 ENCOUNTER — Encounter: Payer: Self-pay | Admitting: Dermatology

## 2020-12-28 DIAGNOSIS — G20 Parkinson's disease: Secondary | ICD-10-CM | POA: Diagnosis not present

## 2020-12-28 DIAGNOSIS — F0280 Dementia in other diseases classified elsewhere without behavioral disturbance: Secondary | ICD-10-CM | POA: Diagnosis not present

## 2020-12-28 DIAGNOSIS — R2689 Other abnormalities of gait and mobility: Secondary | ICD-10-CM | POA: Diagnosis not present

## 2021-01-30 DIAGNOSIS — R4182 Altered mental status, unspecified: Secondary | ICD-10-CM | POA: Diagnosis not present

## 2021-01-30 DIAGNOSIS — R0689 Other abnormalities of breathing: Secondary | ICD-10-CM | POA: Diagnosis not present

## 2021-01-30 DIAGNOSIS — G20 Parkinson's disease: Secondary | ICD-10-CM | POA: Diagnosis not present

## 2021-01-30 DIAGNOSIS — R918 Other nonspecific abnormal finding of lung field: Secondary | ICD-10-CM | POA: Diagnosis not present

## 2021-02-14 DIAGNOSIS — D649 Anemia, unspecified: Secondary | ICD-10-CM | POA: Diagnosis not present

## 2021-02-14 DIAGNOSIS — E118 Type 2 diabetes mellitus with unspecified complications: Secondary | ICD-10-CM | POA: Diagnosis not present

## 2021-02-21 DIAGNOSIS — F0280 Dementia in other diseases classified elsewhere without behavioral disturbance: Secondary | ICD-10-CM | POA: Diagnosis not present

## 2021-02-21 DIAGNOSIS — M5136 Other intervertebral disc degeneration, lumbar region: Secondary | ICD-10-CM | POA: Diagnosis not present

## 2021-02-21 DIAGNOSIS — L851 Acquired keratosis [keratoderma] palmaris et plantaris: Secondary | ICD-10-CM | POA: Diagnosis not present

## 2021-02-21 DIAGNOSIS — E118 Type 2 diabetes mellitus with unspecified complications: Secondary | ICD-10-CM | POA: Diagnosis not present

## 2021-02-21 DIAGNOSIS — R809 Proteinuria, unspecified: Secondary | ICD-10-CM | POA: Diagnosis not present

## 2021-02-21 DIAGNOSIS — G720 Drug-induced myopathy: Secondary | ICD-10-CM | POA: Diagnosis not present

## 2021-02-21 DIAGNOSIS — B351 Tinea unguium: Secondary | ICD-10-CM | POA: Diagnosis not present

## 2021-02-21 DIAGNOSIS — T466X5A Adverse effect of antihyperlipidemic and antiarteriosclerotic drugs, initial encounter: Secondary | ICD-10-CM | POA: Diagnosis not present

## 2021-02-21 DIAGNOSIS — F3340 Major depressive disorder, recurrent, in remission, unspecified: Secondary | ICD-10-CM | POA: Diagnosis not present

## 2021-02-21 DIAGNOSIS — E1169 Type 2 diabetes mellitus with other specified complication: Secondary | ICD-10-CM | POA: Diagnosis not present

## 2021-02-21 DIAGNOSIS — I10 Essential (primary) hypertension: Secondary | ICD-10-CM | POA: Diagnosis not present

## 2021-02-21 DIAGNOSIS — E1142 Type 2 diabetes mellitus with diabetic polyneuropathy: Secondary | ICD-10-CM | POA: Diagnosis not present

## 2021-02-21 DIAGNOSIS — E7800 Pure hypercholesterolemia, unspecified: Secondary | ICD-10-CM | POA: Diagnosis not present

## 2021-02-21 DIAGNOSIS — G20 Parkinson's disease: Secondary | ICD-10-CM | POA: Diagnosis not present

## 2021-03-06 ENCOUNTER — Other Ambulatory Visit: Payer: Self-pay

## 2021-03-06 ENCOUNTER — Telehealth: Payer: PPO

## 2021-03-06 DIAGNOSIS — Z515 Encounter for palliative care: Secondary | ICD-10-CM

## 2021-03-06 NOTE — Progress Notes (Signed)
FOLLOW UP PALLIATIVE CARE CONSULT/SOCIAL WORK VISIT NOTE  PATIENT NAME: Randall [name] DOB: 1950-09-13 MRN: [id number]  REFERRING PROVIDER: Adin Hector, MD [place],  [place] [address]   RESPONSIBLE PARTY:  Acct ID - Guarantor Home Phone Work Phone Relationship Acct Type  [id number] MALAKHI, [name] [phone]  Self P/F     [place], [place], [place] [address]     Palliative care SW outreached patient to complete telephonic visit.   Palliative care SW outreached patient to complete telephonic visit. Patient and spouse, provided update on medical condition and/or changes. Patient shared that he has been doing pretty good since last in home visit. Patient denies any other pains. Patient has had a couple of falls, due to imbalance and turning corners too quickly. Patient does not use AD.  Patients appetite has been fair. Spouse shares that she has not noticed any weight changes in patient. Spouse shares that patient sleeps okay at night and naps some during the day.  Patient denies financial, food insecurities or issues with transportation. No other psychosocial needs. Spouse and patient appreciative of telephonic check in.   Palliative care will continue to monitor and assist with long term care planning as needed. Next in home visit scheduled for 03/13/21 2:00 PM.  CODE STATUS: FULL CODE  ADVANCED DIRECTIVES: Liberty Handy, LCSW

## 2021-03-13 ENCOUNTER — Other Ambulatory Visit: Payer: PPO

## 2021-03-13 ENCOUNTER — Other Ambulatory Visit: Payer: Self-pay

## 2021-03-13 VITALS — BP 116/70 | HR 68 | Temp 97.9°F | Wt 169.0 lb

## 2021-03-13 DIAGNOSIS — Z515 Encounter for palliative care: Secondary | ICD-10-CM

## 2021-03-13 NOTE — Progress Notes (Signed)
PATIENT NAME: Randall [name] DOB: [birth year] MRN: [id number]  PRIMARY CARE PROVIDER: Adin Hector, MD  RESPONSIBLE PARTY:  Acct ID - Guarantor Home Phone Work Phone Relationship Acct Type  [id number] - Hodak,Cardarius B [phone]  Self P/F     [place], [place] [address]    PLAN OF CARE and INTERVENTIONS:               1.  GOALS OF CARE/ ADVANCE CARE PLANNING:  Remain home under the care of his wife.               2.  PATIENT/CAREGIVER EDUCATION:  Kynmobi               4. PERSONAL EMERGENCY PLAN:  Activate 911 for emergencies.               5.  DISEASE STATUS:  Joint visit completed with [place], SW, wife Melba and patient.  Patient blood sugars are being monitored regularly.  Yesterday's blood sugar was 166.  Wife noted only one reading of 184 so far.  She continues to monitor blood sugars and will notify MD if 1/2 of readings are higher than 180.  Patient notes some sadness and reflects on his life on often. Notes he gets "rattled" easily. He believes there has been a decline in his overall condition and this also causes him sadness.  Patient is taking lexapro 10 mg daily.  He also is taking remeron 7.5 mg and seroquel 25 mg at hs.     Patient's speech is quick and slurred.  Sometimes difficult to understand.  Patient enjoys engaging in conversation and notes he feels better just having someone to speak with .    Patient notes worsening tremors that started his pinky finger hand has progressed.  Jaw tightness especially to the right side has worsened.  Right sided neck discomfort.  Patient is going to look into a specialized pillow.  Increase falls are reported by wife.  Most recent fall is today while taking the trash down the stairs.  No injuries are reported.  Toe curling and leg cramping is reported.  Patient takes mustard almost daily for leg cramping. Discussed kynmobi and patient is only taking this 1x daily but can take this up to 5 x daily.  Education provided on use of  kynmobi.  Patient denies any nausea or vomiting with use of kynmobi.  Wife is asking if she will still need to wait 1 hour after taking carbidoba/levadopa.  Encouraged her to reach out nurse who assisted with this medication to ensure no changes have occurred with administering.   Patient reports a urinary incontinence episode recently.  Now using a mattress cover and discussing the use of depends at night time.   Occasional issues with nighttime urinary frequency.  Patient is taking atropine drops at nightly to help with excessive oral secretions.  He notes some coughing and throat clearing but no swallowing difficulty.  Wife notes she can hear patient breathing sometimes which has been new.  We discussed excessive oral secretions and mucous production which patient confirms has been a problem.  If problem continues, wife will consult with PCP.        HISTORY OF PRESENT ILLNESS:  70 year old male with Parkinson's Disease.  Patient is being followed by Palliative Care monthly and PRN.  CODE STATUS: Full ADVANCED DIRECTIVES: No MOST FORM: Yes PPS: 50%   PHYSICAL EXAM:   VITALS: Today's  Vitals   03/13/21 1405  BP: 116/70  Pulse: 68  Temp: 97.9 F (36.6 C)  SpO2: 97%  Weight: 183 lb (83 kg)  PainSc: 0-No pain    LUNGS: clear to auscultation  CARDIAC: Cor RRR}  EXTREMITIES: - for edema SKIN: Skin color, texture, turgor normal. No rashes or lesions or normal  NEURO: positive for coordination problems, gait problems, speech problems, and tremors       Lorenza Burton, RN

## 2021-03-13 NOTE — Progress Notes (Signed)
COMMUNITY PALLIATIVE CARE SW NOTE  PATIENT NAME: Randall [name] DOB: Dec 05, 1950 MRN: [id number]  PRIMARY CARE PROVIDER: Adin Hector, MD  RESPONSIBLE PARTY:  Acct ID - Guarantor Home Phone Work Phone Relationship Acct Type  [id number] - Najarian,Timoth B [phone]  Self P/F     [place], [place], [place] [address]     PLAN OF CARE and INTERVENTIONS:             GOALS OF CARE/ ADVANCE CARE PLANNING:  Patient is a FULL CODE. Goal is to remain at home and be as independent as possible.    2.         SOCIAL/EMOTIONAL/SPIRITUAL ASSESSMENT/ INTERVENTIONS:  SW met with patient and [name] (patient's wife) in the home for scheduled in home visit. Patient and wife updated SW of recent medical changes/updates.   Patient saw neurology, podiatry and PCP since last in person visit. Patient and wife share that patient is experiencing more tremors in head and hands, patient is experiencing more cramps in legs and toes mostly in the morning. Patient and wife share that neurology suggest that these are related to Parkinson's disease progression. Patient takes/eats mustard to assist with cramps as well as magnesium. Wife shared concerns about patient experiencing  more SHOB at exertion, patient shares that clears after a moment of rest.   Patient had a few falls since last in person visit and one most recently earlier today, resulting from imbalance and walking much quicker and turning corners to quick. Patient does not use any AD, but has cane and RW. Patient encouraged to utilize assistive devise for mobility due to increased fall risk. Patient denies any pain other than leg and toe cramps.   Patient reports that he did not sleep well the night before, due to having a lot on his mind and worrying about the past. SW utilized reflective listening around this concern. Patient has sleep aid medication the he takes nightly.    Patient's appetite has declined. Patient continues to utilizes karaoke and speaking to  Alexa to engage his speech.  Patient shares that he is experiencing more secretions and minimal coughing while eating.   RN reviewed medications and vitals signs. Patient and wife takes BS often to be able to report to PCP. Patient takes metformin by 1tab for a trial period due to sugars appearing to be controlled as well as obtain weights weekly in the AM's and continue to monitor BP's and BS's, limit salt.  Patient continues to experience tremors but states that the new Parkinson's medications Kynmobi which is helping his tremors when he takes it. Patient currently taking $RemoveBeforeDE'15mg'ZWIYgKSfJIVIvqQ$  PRN. Patient did express frustration with tremors and now has begun having more tremors in his moth/jaw and a slight twitch with his head.   SW discussed and encouraged parkinsons support groups with patient and wife. Although his mood is a little altered due to frustrations around his physical capabilities from his parkinsons, he declines any S/S of depression and scored 5/14 on PHQ-9. SW incorporated brief SFBT modality during visit to encourage and guide client to engage more in activities that he enjoys doing. SW utilized first half of visit exploring patients current mood and emotions by utilizing active and reflective listening along with MI CBT modality. Patient was open about his emotions and feelings of self-inadequacies due to not being able to do things and attend social functioning's as he once did. By end of visit patient was visibly in a better  mood. Patient takes Lexapro 10 mg once daily and remeron 7.5 mg at HS as well as Seroquel $RemoveBef'25mg'ChAamaVdeB$  once daily.   Patient could benefit from on-going counseling/support in regard to his parkisnons dx. SW will share the following support info at next visit: Arman Filter [phone] [place] at [address] who speacializes in counseling support for parkinsons dx.    SW discussed care and goals. Patient and wife and no other concerns or needs of  palliative care team at this time. Patient is doing well overall. Palliative care will continue to follow.   3.         PATIENT/CAREGIVER EDUCATION/ COPING: Patient is alert, engaged in conversation. Patient was pleasant, jovial and in good spirits. Patient openly expresses feelings with SW. Patient is positive about his situation and tries to maintain his outlook. Patient shared that he enjoys singing Felisa Bonier and has a Estate agent in the home. Patient acknowledges that he has a great support system between friends and family.   4.         PERSONAL EMERGENCY PLAN: Patient will call 9-1-1 for emergencies.   5.         COMMUNITY RESOURCES COORDINATION/ HEALTH CARE NAVIGATION:  Wife assist with care needs.   6.         FINANCIAL/LEGAL CONCERNS/INTERVENTIONS:  None.      SOCIAL HX:  Social History   Tobacco Use   Smoking status: Never   Smokeless tobacco: Never  Substance Use Topics   Alcohol use: Yes    Alcohol/week: 1.0 standard drink    Types: 1 Cans of beer per week    Comment: socially     CODE STATUS: Full code  ADVANCED DIRECTIVES: Y MOST FORM COMPLETE: Y HOSPICE EDUCATION PROVIDED: N  PPS: Patient is independent with all ADL's   Time spent: 1 hr      [place], [place]

## 2021-04-02 DIAGNOSIS — M542 Cervicalgia: Secondary | ICD-10-CM | POA: Diagnosis not present

## 2021-04-02 DIAGNOSIS — G20 Parkinson's disease: Secondary | ICD-10-CM | POA: Diagnosis not present

## 2021-04-02 DIAGNOSIS — F5104 Psychophysiologic insomnia: Secondary | ICD-10-CM | POA: Diagnosis not present

## 2021-04-02 DIAGNOSIS — R2689 Other abnormalities of gait and mobility: Secondary | ICD-10-CM | POA: Diagnosis not present

## 2021-04-02 DIAGNOSIS — F0280 Dementia in other diseases classified elsewhere without behavioral disturbance: Secondary | ICD-10-CM | POA: Diagnosis not present

## 2021-04-02 DIAGNOSIS — G4733 Obstructive sleep apnea (adult) (pediatric): Secondary | ICD-10-CM | POA: Diagnosis not present

## 2021-04-13 DIAGNOSIS — R443 Hallucinations, unspecified: Secondary | ICD-10-CM | POA: Diagnosis not present

## 2021-04-23 DIAGNOSIS — R2689 Other abnormalities of gait and mobility: Secondary | ICD-10-CM | POA: Diagnosis not present

## 2021-04-23 DIAGNOSIS — G20 Parkinson's disease: Secondary | ICD-10-CM | POA: Diagnosis not present

## 2021-04-23 DIAGNOSIS — R443 Hallucinations, unspecified: Secondary | ICD-10-CM | POA: Diagnosis not present

## 2021-05-03 ENCOUNTER — Telehealth: Payer: Self-pay

## 2021-05-03 NOTE — Telephone Encounter (Signed)
3:30PM: Palliative care SW outreached patient to complete telephonic visit.   Palliative care SW outreached patient to complete telephonic visit. Patient provided update on medical condition and/or changes. Patient shared that he has been about the same last in home visit, although he has had some days where he did feel like himself. No recent falls, using walker more.   Patient shares that he continues to have leg cramps in the AM. Patient shares that his sleeping is the same, patient does not sleep too well at night.  Patients shares that his appetite has been fair. He states that he finds some days that he is just not hungry. Patient shares he eats 2 meals a day. Patient has lost 2lbs since previous visit and is now 168lb. patient shares that he is having some coughing/swallowing issues with food and liquids, but nothing significant. SW educated patient on swallowing issues being a symptom of Parkinson's and advised patient to simply take his time with eating and take smaller sips of drinks.   Patient shares that his BS have been  running pretty good lately. However, wife shared that patient had an episode not long ago where it was too low and PCP advised patient double up on medication to avoid any complications  Patient denies financial, food insecurities or issues with transportation. No other psychosocial needs. Patient states that she feels she has been doing well. Patient appreciative of telephonic check in.   Palliative care will continue to monitor and assist with long term care planning as needed. Next in home visit scheduled for 9/6 '@130pm'$ .

## 2021-05-15 ENCOUNTER — Other Ambulatory Visit: Payer: PPO

## 2021-05-17 DIAGNOSIS — E118 Type 2 diabetes mellitus with unspecified complications: Secondary | ICD-10-CM | POA: Diagnosis not present

## 2021-05-17 DIAGNOSIS — E7800 Pure hypercholesterolemia, unspecified: Secondary | ICD-10-CM | POA: Diagnosis not present

## 2021-05-21 ENCOUNTER — Other Ambulatory Visit: Payer: Self-pay

## 2021-05-21 ENCOUNTER — Other Ambulatory Visit: Payer: PPO

## 2021-05-21 DIAGNOSIS — Z515 Encounter for palliative care: Secondary | ICD-10-CM

## 2021-05-21 NOTE — Progress Notes (Signed)
COMMUNITY PALLIATIVE CARE SW NOTE  PATIENT NAME: Randall [name] DOB: 10/11/1950 MRN: [id number]  PRIMARY CARE PROVIDER: Klein, Bert J III, MD  RESPONSIBLE PARTY:  Acct ID - Guarantor Home Phone Work Phone Relationship Acct Type  [id number] - Laser,Montee B [phone]  Self P/F     [address], [place], [place] [address]     PLAN OF CARE and INTERVENTIONS:             GOALS OF CARE/ ADVANCE CARE PLANNING:  Patient is a FULL CODE. Goal is to remain at home and be as independent as possible.    2.         SOCIAL/EMOTIONAL/SPIRITUAL ASSESSMENT/ INTERVENTIONS:  SW met with patient and [name] (patient's wife) in the home for scheduled in home visit. Patient and wife updated SW of recent medical changes/updates.   Patient saw neurology since last in person visit. Patient also reported having some hallucinations since previous visit. UA was ordered, which came resulted in no findings of UTI. Neuro suggested increasing carpidopa to 15mg, patient tried this for a couple weeks but did not see a difference. Patient started back on 10mg of caripdopa. Patient has only had 2 episodes of hallucinations, and has not had any more in the past 2 weeks.   Patient shared that he recently had a COVID exposure form going to karaoke. The owner shared with patient he tested positive for COVID, patient came into contact with this person over a week and a half ago. Patient and wife exhibit no symptoms and tested negative for COVID.  Patient continues to experience more cramps in legs and toes mostly in the morning. Patient takes/eats mustard to assist with cramps as well as magnesium. Patient having pain in neck and shoulder on L side, takes Tylenol every now and then.    Patient continues to be a fall risk and has had a few falls since last in person visit and one most recently a few days ago where patient was walking from mailbox and turned around too quick. Most of patients falls result from imbalance and walking much quicker  and turning corners to quick. Patient does not use any AD, but has cane and RW. Patient encouraged to utilize assistive devise for mobility due to increased fall risk. Patient acknowledges safety requirement of needing to use AD.   Patient reports that sleeping has not changed, continues to not sleep too well. Patient has sleep aid medication the he takes nightly.    Patient's appetite has declined. Patient shares that sometimes he just does not feel like eating or his eating schedule is off some days, as he has to eat something very early in the mornings with the magnesium if he is experiencing leg cramps, this is not everyday. Patient share his current weight around 170lb.  Patient continues to utilizes karaoke and speaking to Alexa to engage his speech.  Patient shares that he continues to experience secretions and minimal coughing while eating and some while drinking.   Patient and wife takes BS often to be able to report to PCP. Patient takes glimperide once daily and metformin by 1tab BID to assist in controlling BS, per wife BS have been running a little high but states PCP says this is fine. Patient obtain weights weekly in the AM's and continue to monitor BP's and BS's, limit salt.   Patient continues to experience tremors but states that the new Parkinson's medications Kynmobi which is helping his tremors when he   takes it. Patient currently taking 86m PRN. Patient did express frustration with tremors and now has begun having more tremors in his moth/jaw and a slight twitch with his head. Wife shared that KJosefa Halfis provided to patient via a grant through the [employer] The rant is up for renewal as of June, and patient is now on waiting list to begin receiving the medication again. Patient has some Kynmobi still, to include some of the 170m However, if this runs out prior to receiving more, patient is unsure what to do. SW to mail patient and wife a patient prescription assistance  application from the manufacturer of the medication to possibly assist with cost if grant does not provide the medication prior to patient running out.     Psychosocial assessment completed. No physical needs identified. SW discussed and encouraged parkinsons support groups with patient and wife. Although his mood is a little altered due to frustrations around his physical capabilities from his parkinsons, he declines any S/S of depression and scored 3/14 on PHQ-9, indicating minimal depression, no treatment recommended. Patient takes Lexapro 10 mg once daily and remeron 7.5 mg at HS as well as Seroquel 2555mnce daily.   Patient could benefit from on-going counseling/support in regard to his parkisnons dx. SW will share the following support info at next visit: JesArman [phone] WalNilda Riggs. who specializes in counseling support for parkinsons dx.     SW discussed care and goals. Patient and wife and no other concerns or needs of palliative care team at this time. Patient is doing well overall. Palliative care will continue to follow.   3.         PATIENT/CAREGIVER EDUCATION/ COPING: Patient is alert, engaged in conversation. Patient was pleasant, jovial and in good spirits. Patient openly expresses feelings with SW. Patient is positive about his situation and tries to maintain his outlook. Patient shared that he enjoys singing karFelisa Bonierd has a karEstate agent the home. Patient acknowledges that he has a great support system between friends and family. _0  4.         PERSONAL EMERGENCY PLAN: Patient will call 9-1-1 for emergencies.   5.         COMMUNITY RESOURCES COORDINATION/ HEALTH CARE NAVIGATION:  Wife assist with care needs.   6.         FINANCIAL/LEGAL CONCERNS/INTERVENTIONS:  None.       SOCIAL HX:  Social History   Tobacco Use   Smoking status: Never   Smokeless tobacco: Never  Substance Use Topics   Alcohol use: Yes     Alcohol/week: 1.0 standard drink    Types: 1 Cans of beer per week    Comment: socially     CODE STATUS: FULL CODE ADVANCED DIRECTIVES: Y MOST FORM COMPLETE:  Y HOSPICE EDUCATION PROVIDED: N  [id number] independent with all ADL's at this time. Patient is A&O x3 with average insight and judgement.       AsiDoreene ElandCSSouth [place]

## 2021-05-28 DIAGNOSIS — I10 Essential (primary) hypertension: Secondary | ICD-10-CM | POA: Diagnosis not present

## 2021-05-28 DIAGNOSIS — F0280 Dementia in other diseases classified elsewhere without behavioral disturbance: Secondary | ICD-10-CM | POA: Diagnosis not present

## 2021-05-28 DIAGNOSIS — M5136 Other intervertebral disc degeneration, lumbar region: Secondary | ICD-10-CM | POA: Diagnosis not present

## 2021-05-28 DIAGNOSIS — E7800 Pure hypercholesterolemia, unspecified: Secondary | ICD-10-CM | POA: Diagnosis not present

## 2021-05-28 DIAGNOSIS — T466X5A Adverse effect of antihyperlipidemic and antiarteriosclerotic drugs, initial encounter: Secondary | ICD-10-CM | POA: Diagnosis not present

## 2021-05-28 DIAGNOSIS — G720 Drug-induced myopathy: Secondary | ICD-10-CM | POA: Diagnosis not present

## 2021-05-28 DIAGNOSIS — F3340 Major depressive disorder, recurrent, in remission, unspecified: Secondary | ICD-10-CM | POA: Diagnosis not present

## 2021-05-28 DIAGNOSIS — G20 Parkinson's disease: Secondary | ICD-10-CM | POA: Diagnosis not present

## 2021-05-28 DIAGNOSIS — E118 Type 2 diabetes mellitus with unspecified complications: Secondary | ICD-10-CM | POA: Diagnosis not present

## 2021-05-30 DIAGNOSIS — F3340 Major depressive disorder, recurrent, in remission, unspecified: Secondary | ICD-10-CM | POA: Diagnosis not present

## 2021-05-30 DIAGNOSIS — F5104 Psychophysiologic insomnia: Secondary | ICD-10-CM | POA: Diagnosis not present

## 2021-05-30 DIAGNOSIS — E118 Type 2 diabetes mellitus with unspecified complications: Secondary | ICD-10-CM | POA: Diagnosis not present

## 2021-05-30 DIAGNOSIS — G20 Parkinson's disease: Secondary | ICD-10-CM | POA: Diagnosis not present

## 2021-05-30 DIAGNOSIS — R2689 Other abnormalities of gait and mobility: Secondary | ICD-10-CM | POA: Diagnosis not present

## 2021-05-30 DIAGNOSIS — F0280 Dementia in other diseases classified elsewhere without behavioral disturbance: Secondary | ICD-10-CM | POA: Diagnosis not present

## 2021-05-30 DIAGNOSIS — G4733 Obstructive sleep apnea (adult) (pediatric): Secondary | ICD-10-CM | POA: Diagnosis not present

## 2021-05-30 DIAGNOSIS — F411 Generalized anxiety disorder: Secondary | ICD-10-CM | POA: Diagnosis not present

## 2021-06-27 ENCOUNTER — Ambulatory Visit: Payer: PPO | Admitting: Dermatology

## 2021-06-27 ENCOUNTER — Encounter: Payer: Self-pay | Admitting: Dermatology

## 2021-06-27 ENCOUNTER — Other Ambulatory Visit: Payer: Self-pay

## 2021-06-27 DIAGNOSIS — L820 Inflamed seborrheic keratosis: Secondary | ICD-10-CM | POA: Diagnosis not present

## 2021-06-27 DIAGNOSIS — L578 Other skin changes due to chronic exposure to nonionizing radiation: Secondary | ICD-10-CM | POA: Diagnosis not present

## 2021-06-27 DIAGNOSIS — L570 Actinic keratosis: Secondary | ICD-10-CM

## 2021-06-27 NOTE — Progress Notes (Signed)
Follow-Up Visit   Subjective  Randall [name] is a 70 y.o. male who presents for the following: Actinic Keratosis (Check for new or persistent skin lesions on the face and scalp.).  The following portions of the chart were reviewed this encounter and updated as appropriate:   Tobacco  Allergies  Meds  Problems  Med Hx  Surg Hx  Fam Hx     Review of Systems:  No other skin or systemic complaints except as noted in HPI or Assessment and Plan.  Objective  Well appearing patient in no apparent distress; mood and affect are within normal limits.  A focused examination was performed including the face and scalp. Relevant physical exam findings are noted in the Assessment and Plan.  Scalp x 17 (17) Erythematous thin papules/macules with gritty scale.   R temple x 1 Erythematous keratotic or waxy stuck-on papule or plaque.    Assessment & Plan  AK (actinic keratosis) (17) Scalp x 17  Destruction of lesion - Scalp x 17 Complexity: simple   Destruction method: cryotherapy   Informed consent: discussed and consent obtained   Timeout:  patient name, date of birth, surgical site, and procedure verified Lesion destroyed using liquid nitrogen: Yes   Region frozen until ice ball extended beyond lesion: Yes   Outcome: patient tolerated procedure well with no complications   Post-procedure details: wound care instructions given    Inflamed seborrheic keratosis R temple x 1  Destruction of lesion - R temple x 1 Complexity: simple   Destruction method: cryotherapy   Informed consent: discussed and consent obtained   Timeout:  patient name, date of birth, surgical site, and procedure verified Lesion destroyed using liquid nitrogen: Yes   Region frozen until ice ball extended beyond lesion: Yes   Outcome: patient tolerated procedure well with no complications   Post-procedure details: wound care instructions given    Actinic Damage - Severe, confluent actinic changes with  pre-cancerous actinic keratoses  - Severe, chronic, not at goal, secondary to cumulative UV radiation exposure over time - diffuse scaly erythematous macules and papules with underlying dyspigmentation - Discussed Prescription "Field Treatment" for Severe, Chronic Confluent Actinic Changes with Pre-Cancerous Actinic Keratoses Field treatment involves treatment of an entire area of skin that has confluent Actinic Changes (Sun/ Ultraviolet light damage) and PreCancerous Actinic Keratoses by method of PhotoDynamic Therapy (PDT) and/or prescription Topical Chemotherapy agents such as 5-fluorouracil, 5-fluorouracil/calcipotriene, and/or imiquimod.  The purpose is to decrease the number of clinically evident and subclinical PreCancerous lesions to prevent progression to development of skin cancer by chemically destroying early precancer changes that may or may not be visible.  It has been shown to reduce the risk of developing skin cancer in the treated area. As a result of treatment, redness, scaling, crusting, and open sores may occur during treatment course. One or more than one of these methods may be used and may have to be used several times to control, suppress and eliminate the PreCancerous changes. Discussed treatment course, expected reaction, and possible side effects. - Recommend daily broad spectrum sunscreen SPF 30+ to sun-exposed areas, reapply every 2 hours as needed.  - Staying in the shade or wearing long sleeves, sun glasses (UVA+UVB protection) and wide brim hats (4-inch brim around the entire circumference of the hat) are also recommended. - Call for new or changing lesions. - PDT scalp in 1 month  Return in about 4 months (around 10/28/2021) for AK f/u, PDT in one month to the  scalp.  I, Rudell Cobb, CMA, am acting as scribe for Sarina Ser, MD . Documentation: I have reviewed the above documentation for accuracy and completeness, and I agree with the above.  Sarina Ser,  MD

## 2021-06-27 NOTE — Patient Instructions (Signed)

## 2021-07-05 ENCOUNTER — Telehealth: Payer: Self-pay

## 2021-07-05 DIAGNOSIS — E1142 Type 2 diabetes mellitus with diabetic polyneuropathy: Secondary | ICD-10-CM | POA: Diagnosis not present

## 2021-07-05 DIAGNOSIS — L851 Acquired keratosis [keratoderma] palmaris et plantaris: Secondary | ICD-10-CM | POA: Diagnosis not present

## 2021-07-05 DIAGNOSIS — B351 Tinea unguium: Secondary | ICD-10-CM | POA: Diagnosis not present

## 2021-07-05 NOTE — Telephone Encounter (Signed)
07/05/21 @140PM : Palliative care SW outreached patient/spouse to schedule in home visit.   Visit scheduled with PC RN and SW for 11/8 @1 :30PM.

## 2021-07-17 ENCOUNTER — Other Ambulatory Visit: Payer: Self-pay

## 2021-07-17 ENCOUNTER — Other Ambulatory Visit: Payer: PPO

## 2021-07-17 VITALS — BP 160/84 | HR 79 | Temp 97.9°F | Resp 18

## 2021-07-17 DIAGNOSIS — Z515 Encounter for palliative care: Secondary | ICD-10-CM

## 2021-07-17 NOTE — Progress Notes (Signed)
COMMUNITY PALLIATIVE CARE SW NOTE  PATIENT NAME: Randall [name] DOB: October 29, 1950 MRN: [id number]  PRIMARY CARE PROVIDER: Adin Hector, MD  RESPONSIBLE PARTY:  Acct ID - Guarantor Home Phone Work Phone Relationship Acct Type  [id number] Randall [name], Randall [name] [phone]  Self P/F     [place], [place], [place] [address]     PLAN OF CARE and INTERVENTIONS:             GOALS OF CARE/ ADVANCE CARE PLANNING:  Patient is a FULL CODE. Goal is to remain at home and be as independent as possible.    2.         SOCIAL/EMOTIONAL/SPIRITUAL ASSESSMENT/ INTERVENTIONS:  SW met with patient and [name] (patient's wife) in the home for scheduled in home visit. Patient and wife updated SW of recent medical changes/updates.   Patient saw neurology since last in person visit. Patients Lexapro has been changed back to 93m, as patient been experiencing increase hallucinations.   Patient continues to experience more cramps in legs and toes mostly in the morning. Patient takes/eats mustard to assist with cramps as well as magnesium. Patient having pain in neck and shoulder on L side, takes Tylenol every now and then. Patient is having more neuropathy symptoms in feet and experiencing numbness.   Patient continues to be a fall risk and has had a few falls since last in person visit, one resulting in wife having to call EMS for assistance. Most of patients falls result from imbalance and walking much quicker and turning corners to quick. Patient now using RW more for assistance. Patient acknowledges safety requirement of needing to use AD.   Patient reports that sleeping has not changed, continues to not sleep too well. Patient has sleep aid medication the he takes nightly.   Patient's appetite has declined. Patient shares that sometimes he just does not feel like eating or his eating schedule is off some days, as he has to eat something very early in the mornings with the magnesium if he is experiencing leg cramps,  this is not everyday. Patient share his current weight around 170lb. Patient share that he experiences some coughing while eating.   Patient continues to utilizes karaoke and speaking to Alexa to engage his speech.  Patient shares that he continues to experience secretions and minimal coughing while eating and some while drinking.   RN reviewed medications and took vitals. Patient as received Kynombi, from [facility] Patients Lexapro was increased to 121m RN discussed the possibility of gabapentin for neuropathy symptoms.     Psychosocial assessment completed. No physical needs identified. SW discussed and encouraged parkinsons support groups with patient and wife. Although his mood is a little altered due to frustrations around his physical capabilities from his parkinsons, patient has S/S of depression and scored 3/14 on PHQ-9, indicating minimal depression, no treatment recommended. Patient takes Lexapro 10 mg once daily and remeron 7.5 mg at HS as well as Seroquel 2545mnce daily.   Patient could benefit from on-going counseling/support in regard to his parkisnons dx. SW will share the following support info at next visit: JesArman [phone] WalNilda Riggs. who specializes in counseling support for parkinsons dx.    SW discussed care and goals. Patient and wife and no other concerns or needs of palliative care team at this time. Patient is doing well overall. Palliative care will continue to follow.   3.  PATIENT/CAREGIVER EDUCATION/ COPING: Patient is alert, engaged in conversation. Patient was pleasant, jovial and in good spirits. Patient openly expresses feelings with SW. Patient is positive about his situation and tries to maintain his outlook. Patient shared that he enjoys singing Felisa Bonier and has a Estate agent in the home. Patient acknowledges that he has a great support system between friends and family.   4.         PERSONAL  EMERGENCY PLAN: Patient will call 9-1-1 for emergencies.   5.         COMMUNITY RESOURCES COORDINATION/ HEALTH CARE NAVIGATION:  Wife assist with care needs.   6.         FINANCIAL/LEGAL CONCERNS/INTERVENTIONS:  None.      SOCIAL HX:  Social History   Tobacco Use   Smoking status: Never   Smokeless tobacco: Never  Substance Use Topics   Alcohol use: Yes    Alcohol/week: 1.0 standard drink    Types: 1 Cans of beer per week    Comment: socially     CODE STATUS: Full code ADVANCED DIRECTIVES: Y MOST FORM COMPLETE: Y HOSPICE EDUCATION PROVIDED: N  PPS: Patient is independent with ADL's, however is a high fall risk due yo parkinson. Patient is A&O x3 and able to make needs known with average insight and judgement.   Times spent: [place], [place]

## 2021-07-17 NOTE — Progress Notes (Signed)
PATIENT NAME: Randall [name] DOB: 06/23/1951 MRN: [id number]  PRIMARY CARE PROVIDER: Adin Hector, MD  RESPONSIBLE PARTY:  Acct ID - Guarantor Home Phone Work Phone Relationship Acct Type  [id number] Randall [name], Randall [name] [phone]  Self P/F     [place], Randall [name], Randall [name] [address]    PLAN OF CARE and INTERVENTIONS:               1.  GOALS OF CARE/ ADVANCE CARE PLANNING:  Patient desires to remain home and independent under the care of his wife.                2.  PATIENT/CAREGIVER EDUCATION:  Neuropathy               4. PERSONAL EMERGENCY PLAN:  Activate 911 for emergencies.                5.  DISEASE STATUS:  Joint visit completed with patient, wife and Randall [name], [place].   Hallucinations:  Patient endorses ongoing hallucinations.  He felt this was related to Lexapro 15 mg and he has decreased this back to 10 mg about 3 weeks ago.  Patient has not seen any improvements.  Discussion completed on disease progression with Parkinson's Disease.   Tremors:  Patient endorses tremors to bilateral hands.  Reports "freezing" at times with mobility.  Patient is taking Kynmobi  as needed and reports this is helpful.   Falls:  Patient is utilizing a rolling walker more consistently.  Wife reports multiple falls since last Palliative Care visit.  EMS was called on one occasion to assist patient from the floor.  No injuries are reported by patient or wife.   Neuropathy:  Patient is reporting numbness and tingling constant to the left foot and on occasion to the right foot.  Education provided on neuropathy.  Wife will follow up with neurology to see if Gabapentin could possibly be used.   DM:  Blood sugars are being monitored but not daily.  Last blood sugar was checked 2 days ago with a reading of 170.  Encouraged patient to check blood sugars more regularly.     HISTORY OF PRESENT ILLNESS:  [name] is a 70 year old male with Parkinson's Disease, HTN, Type 2 Diabetes,   CODE STATUS:  Full ADVANCED DIRECTIVES: No MOST FORM: Yes PPS: 50%   PHYSICAL EXAM:   VITALS: Today's Vitals   07/17/21 1348  BP: (!) 160/84  Pulse: 79  Resp: 18  Temp: 97.9 F (36.6 C)  SpO2: 98%  PainSc: 0-No pain    LUNGS: clear to auscultation  CARDIAC: Cor RRR}  EXTREMITIES: - for edema SKIN: Skin color, texture, turgor normal. No rashes or lesions or normal  NEURO: positive for dizziness, gait problems, and tremors       Randall Burton, RN

## 2021-07-31 ENCOUNTER — Other Ambulatory Visit: Payer: Self-pay

## 2021-07-31 ENCOUNTER — Ambulatory Visit: Payer: PPO

## 2021-07-31 DIAGNOSIS — L570 Actinic keratosis: Secondary | ICD-10-CM | POA: Diagnosis not present

## 2021-07-31 MED ORDER — AMINOLEVULINIC ACID HCL 20 % EX SOLR
1.0000 "application " | Freq: Once | CUTANEOUS | Status: AC
  Administered 2021-07-31: 354 mg via TOPICAL

## 2021-07-31 NOTE — Patient Instructions (Signed)

## 2021-07-31 NOTE — Progress Notes (Signed)
Patient completed PDT therapy today.  1. AK (actinic keratosis) Scalp  Photodynamic therapy - Scalp Procedure discussed: discussed risks, benefits, side effects. and alternatives   Prep: site scrubbed/prepped with acetone   Location:  Scalp Number of lesions:  Multiple Type of treatment:  Blue light Aminolevulinic Acid (see MAR for details): Levulan Number of Levulan sticks used:  1 Number of minutes under lamp:  16 Number of seconds under lamp:  40 Cooling:  Floor fan Post-procedure details: sunscreen applied    Aminolevulinic Acid HCl 20 % SOLR 354 mg - Scalp

## 2021-08-20 DIAGNOSIS — E7800 Pure hypercholesterolemia, unspecified: Secondary | ICD-10-CM | POA: Diagnosis not present

## 2021-08-20 DIAGNOSIS — E118 Type 2 diabetes mellitus with unspecified complications: Secondary | ICD-10-CM | POA: Diagnosis not present

## 2021-08-20 DIAGNOSIS — M5136 Other intervertebral disc degeneration, lumbar region: Secondary | ICD-10-CM | POA: Diagnosis not present

## 2021-08-22 DIAGNOSIS — R443 Hallucinations, unspecified: Secondary | ICD-10-CM | POA: Diagnosis not present

## 2021-08-22 DIAGNOSIS — R2689 Other abnormalities of gait and mobility: Secondary | ICD-10-CM | POA: Diagnosis not present

## 2021-08-22 DIAGNOSIS — G4733 Obstructive sleep apnea (adult) (pediatric): Secondary | ICD-10-CM | POA: Diagnosis not present

## 2021-08-22 DIAGNOSIS — F411 Generalized anxiety disorder: Secondary | ICD-10-CM | POA: Diagnosis not present

## 2021-08-22 DIAGNOSIS — F5104 Psychophysiologic insomnia: Secondary | ICD-10-CM | POA: Diagnosis not present

## 2021-08-22 DIAGNOSIS — F0280 Dementia in other diseases classified elsewhere without behavioral disturbance: Secondary | ICD-10-CM | POA: Diagnosis not present

## 2021-08-22 DIAGNOSIS — G20 Parkinson's disease: Secondary | ICD-10-CM | POA: Diagnosis not present

## 2021-08-22 DIAGNOSIS — F3340 Major depressive disorder, recurrent, in remission, unspecified: Secondary | ICD-10-CM | POA: Diagnosis not present

## 2021-09-06 DIAGNOSIS — I10 Essential (primary) hypertension: Secondary | ICD-10-CM | POA: Diagnosis not present

## 2021-09-06 DIAGNOSIS — F0280 Dementia in other diseases classified elsewhere without behavioral disturbance: Secondary | ICD-10-CM | POA: Diagnosis not present

## 2021-09-06 DIAGNOSIS — E118 Type 2 diabetes mellitus with unspecified complications: Secondary | ICD-10-CM | POA: Diagnosis not present

## 2021-09-06 DIAGNOSIS — F3340 Major depressive disorder, recurrent, in remission, unspecified: Secondary | ICD-10-CM | POA: Diagnosis not present

## 2021-09-06 DIAGNOSIS — B351 Tinea unguium: Secondary | ICD-10-CM | POA: Diagnosis not present

## 2021-09-06 DIAGNOSIS — E1169 Type 2 diabetes mellitus with other specified complication: Secondary | ICD-10-CM | POA: Diagnosis not present

## 2021-09-06 DIAGNOSIS — M5136 Other intervertebral disc degeneration, lumbar region: Secondary | ICD-10-CM | POA: Diagnosis not present

## 2021-09-06 DIAGNOSIS — G20 Parkinson's disease: Secondary | ICD-10-CM | POA: Diagnosis not present

## 2021-09-06 DIAGNOSIS — E7800 Pure hypercholesterolemia, unspecified: Secondary | ICD-10-CM | POA: Diagnosis not present

## 2021-09-27 DIAGNOSIS — G319 Degenerative disease of nervous system, unspecified: Secondary | ICD-10-CM | POA: Diagnosis not present

## 2021-10-05 DIAGNOSIS — I10 Essential (primary) hypertension: Secondary | ICD-10-CM | POA: Diagnosis not present

## 2021-10-05 DIAGNOSIS — E118 Type 2 diabetes mellitus with unspecified complications: Secondary | ICD-10-CM | POA: Diagnosis not present

## 2021-10-05 DIAGNOSIS — R0789 Other chest pain: Secondary | ICD-10-CM | POA: Diagnosis not present

## 2021-10-05 DIAGNOSIS — S62354A Nondisplaced fracture of shaft of fourth metacarpal bone, right hand, initial encounter for closed fracture: Secondary | ICD-10-CM | POA: Diagnosis not present

## 2021-10-05 DIAGNOSIS — W010XXA Fall on same level from slipping, tripping and stumbling without subsequent striking against object, initial encounter: Secondary | ICD-10-CM | POA: Diagnosis not present

## 2021-10-05 DIAGNOSIS — G20 Parkinson's disease: Secondary | ICD-10-CM | POA: Diagnosis not present

## 2021-10-05 DIAGNOSIS — Y92009 Unspecified place in unspecified non-institutional (private) residence as the place of occurrence of the external cause: Secondary | ICD-10-CM | POA: Diagnosis not present

## 2021-10-05 DIAGNOSIS — M79641 Pain in right hand: Secondary | ICD-10-CM | POA: Diagnosis not present

## 2021-10-12 DIAGNOSIS — S62324A Displaced fracture of shaft of fourth metacarpal bone, right hand, initial encounter for closed fracture: Secondary | ICD-10-CM | POA: Diagnosis not present

## 2021-10-22 DIAGNOSIS — B351 Tinea unguium: Secondary | ICD-10-CM | POA: Diagnosis not present

## 2021-10-22 DIAGNOSIS — E1142 Type 2 diabetes mellitus with diabetic polyneuropathy: Secondary | ICD-10-CM | POA: Diagnosis not present

## 2021-10-22 DIAGNOSIS — L851 Acquired keratosis [keratoderma] palmaris et plantaris: Secondary | ICD-10-CM | POA: Diagnosis not present

## 2021-10-26 ENCOUNTER — Other Ambulatory Visit: Payer: Self-pay

## 2021-10-26 ENCOUNTER — Other Ambulatory Visit: Payer: PPO

## 2021-10-26 DIAGNOSIS — Z515 Encounter for palliative care: Secondary | ICD-10-CM

## 2021-10-26 NOTE — Progress Notes (Signed)
PATIENT NAME: Randall [name] DOB: 1950-09-27 MRN: [id number]  PRIMARY CARE PROVIDER: Adin Hector, MD  RESPONSIBLE PARTY:  Acct ID - Guarantor Home Phone Work Phone Relationship Acct Type  [id number] CHRISOPHER, PUSTEJOVSKY [phone]  Self P/F     [place], [place], [place] [address]   Due to the COVID-19 crisis, this visit was done via telemedicine from my office and it was initiated and consent by this patient and or family.  I connected with  Randall [name] OR PROXY on 10/26/21 by phone and verified that I am speaking with the correct person using two identifiers.   I discussed the limitations of evaluation and management by telemedicine. The patient expressed understanding and agreed to proceed.   PLAN OF CARE and INTERVENTIONS:               1.  GOALS OF CARE/ ADVANCE CARE PLANNING:  Remain home under the care of his wife-[name].               2. PERSONAL EMERGENCY PLAN:  Activate 911 for emergencies.                3.  DISEASE STATUS:  Connected with wife Randall [name] by telephone.  She advises patient sustained a fall recently that resulted in a fracture to his right hand. Wife shared they are scheduled to follow up with Cassell Smiles, PA on Monday at 4.   Patient continues to have issues with hallucinations and is hoping to start on Nuplacid.  Currently awaiting approval for financial grant. Continues with kynmobi.  Patient continues to have "spells" in the early mornings making it difficult to go to am appointments.  She is agreeable to an afternoon visit for next month.  Palliative Care will see patient on 3/7@ 130 pm.   HISTORY OF PRESENT ILLNESS:  Randall [name] is a 71 year old male with Parkinson's Disease, HTN, Type 2 Diabetes.  Patient is being followed by Palliative Care every 4-8 weeks and PRN.   CODE STATUS: Full ADVANCED DIRECTIVES: No MOST FORM: Yes PPS: 50%       Lorenza Burton, RN

## 2021-10-29 DIAGNOSIS — S62324A Displaced fracture of shaft of fourth metacarpal bone, right hand, initial encounter for closed fracture: Secondary | ICD-10-CM | POA: Diagnosis not present

## 2021-10-31 ENCOUNTER — Ambulatory Visit: Payer: PPO | Admitting: Dermatology

## 2021-11-05 DIAGNOSIS — E118 Type 2 diabetes mellitus with unspecified complications: Secondary | ICD-10-CM | POA: Diagnosis not present

## 2021-11-05 DIAGNOSIS — F3340 Major depressive disorder, recurrent, in remission, unspecified: Secondary | ICD-10-CM | POA: Diagnosis not present

## 2021-11-05 DIAGNOSIS — G20 Parkinson's disease: Secondary | ICD-10-CM | POA: Diagnosis not present

## 2021-11-05 DIAGNOSIS — S8012XA Contusion of left lower leg, initial encounter: Secondary | ICD-10-CM | POA: Diagnosis not present

## 2021-11-05 DIAGNOSIS — F0280 Dementia in other diseases classified elsewhere without behavioral disturbance: Secondary | ICD-10-CM | POA: Diagnosis not present

## 2021-11-13 ENCOUNTER — Other Ambulatory Visit: Payer: PPO

## 2021-11-13 ENCOUNTER — Other Ambulatory Visit: Payer: Self-pay

## 2021-11-13 VITALS — BP 120/70 | HR 69 | Temp 97.2°F | Resp 18

## 2021-11-13 DIAGNOSIS — Z515 Encounter for palliative care: Secondary | ICD-10-CM

## 2021-11-13 NOTE — Progress Notes (Signed)
PATIENT NAME: Randall [name] ?DOB: [birth year] ?MRN: [id number] ? ?PRIMARY CARE PROVIDER: Adin Hector, MD ? ?RESPONSIBLE PARTY:  ?Acct ID - Guarantor Home Phone Work Phone Relationship Acct Type  ?[id number] Randall [name] [phone]  Self P/F  ?   [place], [place], [place] [address]  ? ? ?PLAN OF CARE and INTERVENTIONS: ?              1.  GOALS OF CARE/ ADVANCE CARE PLANNING:  Remain home under the care of his wife.  ?              2.  PATIENT/CAREGIVER EDUCATION:  Safety ?              4. PERSONAL EMERGENCY PLAN:  Activate 911 for emergencies.  ?              5.  DISEASE STATUS: ? ?Dysphagia: Patient denies any issues with swallowing of liquids or foods.  No excessive oral secretions.  ? ?Dysarthria:  Speech is difficult to understand during most of the visit.  Speech can be rather rapid with some slurring of words and difficulty with pronunciation. ? ?Fall Risk:  Patient has sustained multiple falls over the last month.  2 falls occurred in the last 24 hours. One requiring EMS assistance to assist patient off the ground.  Patient has multiple areas of bruising on bilateral arms,left lower leg and foot.  Scab in present to the left knee. ? ?Hallucinations:  Ongoing issues with hallucinations.   Patient was recently started on Nuplacid but had a fall about 2 days after starting this.  Patient is scheduled to see Dr. Manuella [name] on March 15 so medication is currently on hold.  Hand fracture was a result of patient having a hallucination. ? ?Medication Management: Wife is managing medication. ? ?Safety:  Patient is a flight risk due to hallucinations.  Patient also has poor safety awareness.  Patient [place] walker.  Last night , patient wanted to check the mail and went outside without assistance and sustained a falls.  We discussed locks on the door and door alarms that would alert his wife when he is attempting to leave the home.  ? ?Weight:  Patient endorses a good  appetite.  Most recent weight in February 2023 of 174 lbs.  This is up 4 lbs since July 2023.  ?  ?HISTORY OF PRESENT ILLNESS:  Randall [name] is a 71 year old male with Parkinson's Disease, HTN, Type 2 Diabetes.  Patient is being followed by Palliative Care every 4-8 weeks and PRN. ? ?CODE STATUS: Full ?ADVANCED DIRECTIVES: No ?MOST FORM: Yes ?PPS: 50% ? ? ?PHYSICAL EXAM:  ? ?VITALS: ?Today's Vitals  ? 11/13/21 1328  ?BP: 120/70  ?Pulse: 69  ?Resp: 18  ?Temp: (!) 97.2 ?F (36.2 ?C)  ?SpO2: 96%  ?PainSc: 0-No pain  ?  ?LUNGS: clear to auscultation  ?CARDIAC: Cor RRR}  ?EXTREMITIES: 1+ bilateral pedal edema. ?SKIN: Skin color, texture, turgor normal. No rashes or lesions or ecchymoses - arm(s) right and left lower leg and foot.  Scab in place to left knee. ?NEURO: positive for gait problems and speech problems ? ? ? ? ? ? ?Randall Burton, RN ? ?

## 2021-11-13 NOTE — Progress Notes (Signed)
COMMUNITY PALLIATIVE CARE SW NOTE ? ?PATIENT NAME: Randall [name] ?DOB: 02-Feb-1951 ?MRN: [id number] ? ?PRIMARY CARE PROVIDER: Adin Hector, MD ? ?RESPONSIBLE PARTY:  ?Acct ID - Guarantor Home Phone Work Phone Relationship Acct Type  ?[id number] FITZROY, MIKAMI [phone]  Self P/F  ?   [place], [place], [place] [address]  ? ? ? ?PLAN OF CARE and INTERVENTIONS:             ? ?GOALS OF CARE/ ADVANCE CARE PLANNING: Goals include to maximize quality of life and symptom management. Our advance care planning conversation included a discussion about:    ?The value and importance of advance care planning  ?Review and updating or creation of an advance directive document.  ? Patient is a FULL CODE.  ?  ?2. Palliative care encounter: Palliative medicine team will continue to support patient, patient's family, and medical team. Visit consisted of counseling and education dealing with the complex and emotionally intense issues of symptom management and palliative care in the setting of serious and potentially life-threatening illness. ? ?SW and RN met with patient and Georgina Snell (patient's wife) for monthly in home visit. Patient lives in one story home with spouse. Patient has progressing Parkinson disease. ? ?Functional changes/updates: Patient has had a slight functional change/decline since his recent PC visit. Patient greeted PC team at the door. Patient has had a number of falls since previous visit, with the most recent 2 being yesterday, one in the AM and one in the PM. Patient sustained bruising to his L knee from fall. Patient has a shuffle gait and unsteady on his feet. Fall and safety precautions discussed. Patient continues to use RW to assist with ambulation at times.  Patient continues to be independent with all ADL's with MIN supervision from spouse.  ? ?Spouse and patient share that patient is having more hallucination and patient is relating them to real life events which is causing some safety concerns such  patient leaving the home without making spouse aware or attempting to fight someone who is not present, this recent hallucination caused patient to break his R hand. Patient started on Nuplacid this past Fri to assist with the hallucinations but has not seen an improvement. Nuplacid is currently on hold. Patient is a higher fall risk now and speech is becoming more unclear.  ? ?Home & Environment assessment: No concerns. Patient feels safe in her home environment. Patients home is one level. Patient is able to maneuver around her home without issue. Patient may require a ramp for steps as he has steps to each entrance to the home and they may become more of a trip/fall hazard for patient.  ? ?Protein calorie malnutrition/weight loss/Appetite: none noted. See RN note.  ?  ? ?Psychosocial assessment: completed. No additional physical needs identified at this time. Ongoing support/resources will continued to be offered if needed. Life alert system discussed and resources shared with spouse. ? ?Military hx: N/A  ? ?Medical assessment: RN reviewed medications and took vitals.  ? ?Hospice discussion: N/A ? ?SW discussed goals, reviewed care plan, provided emotional support, used active and reflective listening in the form of reciprocity emotional response. Questions and concerns were addressed. The patient/family was encouraged to call with any additional questions and/or concerns. PC Provided general support and encouragement, no other unmet needs identified. Will continue to follow. ? ?3.         PATIENT/CAREGIVER EDUCATION/ COPING:   ?Appearance: well groomed, appropriate given situation  ?Mental  Status: Alert/oriented. ?Eye Contact: Good. Able to engage in proper eye contact  ?Thought Process: rational  ?Thought Content: not assessed  ?Speech: fast unclear rate at times, volume, tone  ?Mood: Normal and calm ?Affect: Congruent to endorsed mood, full ranging ?Insight: normal ?Judgement: normal  ?Interaction Style:  Cooperative ?Patient A&O, patient engaged in fluent conversation and answered all questions appropriately. Patients mood was jovial during visit today. Patient continues with S/S of depression and scored 4/14 on PHQ-9, indicating minimal depression, no treatment recommended. Patient takes Lexapro 10 mg once daily and remeron 7.5 mg at HS as well as Seroquel 20m once daily. Patient typically enjoys karoke, but has not seemed to go and participate lately.  ? ?4.         PERSONAL EMERGENCY PLAN:  Patient will call 9-1-1 for emergencies.   ? ?5.         COMMUNITY RESOURCES COORDINATION/ HEALTH CARE NAVIGATION:  Patient and children manages his care. ? ?6.  FINANCIAL CONCERNS/NEEDS: None. ?  Primary Health Insurance: HTA Medicare ?Secondary Health Insurance: N/A ?Prescription Coverage: Yes, no history of difficulty obtaining or affording prescriptions reported ?   ? ?SOCIAL HX:  ?Social History  ? ?Tobacco Use  ? Smoking status: Never  ? Smokeless tobacco: Never  ?Substance Use Topics  ? Alcohol use: Yes  ?  Alcohol/week: 1.0 standard drink  ?  Types: 1 Cans of beer per week  ?  Comment: socially   ? ? ?CODE STATUS: FULL CODE ?ADVANCED DIRECTIVES: Y ?MOST FORM COMPLETE:  Y ?HOSPICE EDUCATION PROVIDED: N ? ?PPS: Patient continues to be independent with all ADL's with MIN supervision from spouse.  ? ?Time spent: 45 min  ? ? ?ADoreene Eland LCSW ? ?

## 2021-11-14 ENCOUNTER — Emergency Department: Payer: PPO

## 2021-11-14 ENCOUNTER — Other Ambulatory Visit: Payer: Self-pay

## 2021-11-14 ENCOUNTER — Emergency Department
Admission: EM | Admit: 2021-11-14 | Discharge: 2021-11-14 | Disposition: A | Discharge status: Home / Self Care | Payer: PPO | Attending: Emergency Medicine | Admitting: Emergency Medicine

## 2021-11-14 DIAGNOSIS — R29818 Other symptoms and signs involving the nervous system: Secondary | ICD-10-CM | POA: Diagnosis not present

## 2021-11-14 DIAGNOSIS — S40021A Contusion of right upper arm, initial encounter: Secondary | ICD-10-CM | POA: Insufficient documentation

## 2021-11-14 DIAGNOSIS — S40022A Contusion of left upper arm, initial encounter: Secondary | ICD-10-CM | POA: Insufficient documentation

## 2021-11-14 DIAGNOSIS — G319 Degenerative disease of nervous system, unspecified: Secondary | ICD-10-CM | POA: Diagnosis not present

## 2021-11-14 DIAGNOSIS — G20 Parkinson's disease: Secondary | ICD-10-CM | POA: Insufficient documentation

## 2021-11-14 DIAGNOSIS — R531 Weakness: Secondary | ICD-10-CM | POA: Diagnosis not present

## 2021-11-14 DIAGNOSIS — S99912A Unspecified injury of left ankle, initial encounter: Secondary | ICD-10-CM | POA: Diagnosis present

## 2021-11-14 DIAGNOSIS — R5383 Other fatigue: Secondary | ICD-10-CM | POA: Diagnosis not present

## 2021-11-14 DIAGNOSIS — E119 Type 2 diabetes mellitus without complications: Secondary | ICD-10-CM | POA: Insufficient documentation

## 2021-11-14 DIAGNOSIS — S8011XA Contusion of right lower leg, initial encounter: Secondary | ICD-10-CM | POA: Insufficient documentation

## 2021-11-14 DIAGNOSIS — I10 Essential (primary) hypertension: Secondary | ICD-10-CM | POA: Insufficient documentation

## 2021-11-14 DIAGNOSIS — Z7401 Bed confinement status: Secondary | ICD-10-CM | POA: Diagnosis not present

## 2021-11-14 DIAGNOSIS — W19XXXA Unspecified fall, initial encounter: Secondary | ICD-10-CM | POA: Insufficient documentation

## 2021-11-14 DIAGNOSIS — M7989 Other specified soft tissue disorders: Secondary | ICD-10-CM | POA: Diagnosis not present

## 2021-11-14 DIAGNOSIS — S9002XA Contusion of left ankle, initial encounter: Secondary | ICD-10-CM | POA: Diagnosis not present

## 2021-11-14 DIAGNOSIS — J320 Chronic maxillary sinusitis: Secondary | ICD-10-CM | POA: Diagnosis not present

## 2021-11-14 DIAGNOSIS — R296 Repeated falls: Secondary | ICD-10-CM | POA: Diagnosis not present

## 2021-11-14 LAB — URINALYSIS, COMPLETE (UACMP) WITH MICROSCOPIC
Bacteria, UA: NONE SEEN
Bilirubin Urine: NEGATIVE
Glucose, UA: NEGATIVE mg/dL
Ketones, ur: 5 mg/dL — AB
Leukocytes,Ua: NEGATIVE
Nitrite: NEGATIVE
Protein, ur: NEGATIVE mg/dL
Specific Gravity, Urine: 1.004 — ABNORMAL LOW (ref 1.005–1.030)
Squamous Epithelial / HPF: NONE SEEN (ref 0–5)
WBC, UA: NONE SEEN WBC/hpf (ref 0–5)
pH: 8 (ref 5.0–8.0)

## 2021-11-14 LAB — COMPREHENSIVE METABOLIC PANEL
ALT: 25 U/L (ref 0–44)
AST: 56 U/L — ABNORMAL HIGH (ref 15–41)
Albumin: 3.9 g/dL (ref 3.5–5.0)
Alkaline Phosphatase: 118 U/L (ref 38–126)
Anion gap: 8 (ref 5–15)
BUN: 13 mg/dL (ref 8–23)
CO2: 30 mmol/L (ref 22–32)
Calcium: 9.1 mg/dL (ref 8.9–10.3)
Chloride: 94 mmol/L — ABNORMAL LOW (ref 98–111)
Creatinine, Ser: 1.04 mg/dL (ref 0.61–1.24)
GFR, Estimated: >60 mL/min (ref >60)
Glucose, Bld: 117 mg/dL — ABNORMAL HIGH (ref 70–99)
Potassium: 4 mmol/L (ref 3.5–5.1)
Sodium: 132 mmol/L — ABNORMAL LOW (ref 135–145)
Total Bilirubin: 1.8 mg/dL — ABNORMAL HIGH (ref 0.3–1.2)
Total Protein: 7.1 g/dL (ref 6.5–8.1)

## 2021-11-14 LAB — CBC
HCT: 41 % (ref 39.0–52.0)
Hemoglobin: 13.4 g/dL (ref 13.0–17.0)
MCH: 29.6 pg (ref 26.0–34.0)
MCHC: 32.7 g/dL (ref 30.0–36.0)
MCV: 90.5 fL (ref 80.0–100.0)
Platelets: 343 10*3/uL (ref 150–400)
RBC: 4.53 MIL/uL (ref 4.22–5.81)
RDW: 12.5 % (ref 11.5–15.5)
WBC: 9 10*3/uL (ref 4.0–10.5)
nRBC: 0 % (ref 0.0–0.2)

## 2021-11-14 LAB — TROPONIN I (HIGH SENSITIVITY): Troponin I (High Sensitivity): 16 ng/L (ref <18)

## 2021-11-14 MED ORDER — CLONIDINE HCL 0.1 MG PO TABS
0.1000 mg | ORAL_TABLET | Freq: Once | ORAL | Status: AC
  Administered 2021-11-14: 0.1 mg via ORAL
  Filled 2021-11-14: qty 1

## 2021-11-14 MED ORDER — SODIUM CHLORIDE 0.9 % IV BOLUS
1000.0000 mL | Freq: Once | INTRAVENOUS | Status: AC
  Administered 2021-11-14: 1000 mL via INTRAVENOUS

## 2021-11-14 NOTE — ED Notes (Signed)
Urine sample sent to lab

## 2021-11-14 NOTE — ED Notes (Signed)
Pt's wife at bedside.

## 2021-11-14 NOTE — ED Triage Notes (Signed)
Pt BIB EMS from home for generalized weakness & not acting his normal self. Pt has had falls almost daily according to wife. Pt has multiple bruises from falls. Per wife, pts R hand is broken. ?

## 2021-11-14 NOTE — ED Provider Notes (Signed)
? ?[facility] ?Provider Note ? ? ? Event Date/Time  ? First MD Initiated Contact with Patient 11/14/21 1226   ?  (approximate) ? ?History  ? ?Chief Complaint: Weakness ? ?HPI ? ?Randall [name] is a 71 y.o. male with a past medical history of depression, diabetes, hypertension, Parkinson's who presents to the emergency department for fatigue.  According to EMS report wife was having some trouble getting the patient up this morning he appeared to be very fatigued and weak.  Once EMS arrived patient was awake alert, no acute distress.  Patient has admitted feeling somewhat weak recently with multiple falls over the last few days.  Patient has a history of Parkinson's and does fall on occasion but has had increased falls recently.  Denies any fever cough congestion chest pain abdominal pain.  Patient does have multiple bruises over his extremities which patient states are from prior falls.  When asked if he has hit his head during any of the falls he states several times. ? ?Physical Exam  ? ?Triage Vital Signs: ?ED Triage Vitals  ?Enc Vitals Group  ?   BP 11/14/21 1232 (!) 179/81  ?   Pulse Rate 11/14/21 1232 66  ?   Resp 11/14/21 1232 14  ?   Temp --   ?   Temp src --   ?   SpO2 11/14/21 1226 98 %  ?   Weight 11/14/21 1228 169 lb 1.5 oz (76.7 kg)  ?   Height 11/14/21 1228 '5\' 6"'$  (1.676 m)  ?   Head Circumference --   ?   Peak Flow --   ?   Pain Score --   ?   Pain Loc --   ?   Pain Edu? --   ?   Excl. in [place]? --   ? ? ?Most recent vital signs: ?Vitals:  ? 11/14/21 1226 11/14/21 1232  ?BP:  (!) 179/81  ?Pulse:  66  ?Resp:  14  ?SpO2: 98% 97%  ? ? ?General: Awake, no distress.  ?CV:  Good peripheral perfusion.  Regular rate and rhythm  ?Resp:  Normal effort.  Equal breath sounds bilaterally.  ?Abd:  No distention.  Soft, nontender.  No rebound or guarding. ?Other:  Patient does have bruising sparsely noted over all extremities.  Patient does have a larger hematoma to the left ankle with some swelling  to the left foot but good range of motion in the ankle.  No obvious signs of head trauma. ? ? ?ED Results / Procedures / Treatments  ? ?EKG ? ?EKG viewed and interpreted by myself shows normal sinus rhythm at 62 bpm with a narrow QRS, normal axis, normal intervals, no concerning ST changes. ? ?RADIOLOGY ? ?I personally reviewed the ankle x-ray images no acute fracture seen on my evaluation. ?Radiology is read the x-ray is negative. ?Radiology is read the CT scan is negative for acute intracranial abnormality. ? ? ?MEDICATIONS ORDERED IN ED: ?Medications  ?sodium chloride 0.9 % bolus 1,000 mL (has no administration in time range)  ? ? ? ?IMPRESSION / MDM / ASSESSMENT AND PLAN / ED COURSE  ?I reviewed the triage vital signs and the nursing notes. ? ?Patient presents emergency department for generalized weakness with increased falls and fatigue this morning.  Currently patient states he feels well, states he feels normal currently.  Patient has a history of Parkinson's does use a walker to ambulate and has had increased falls recently per patient.  Patient  states he hit his head several times.  We will obtain a CT scan of the head as a precaution.  We will check labs, urinalysis, EKG we will IV hydrate and continue to closely monitor while awaiting results as well as family arrival for further history. ? ?Patient's work-up is overall reassuring.  CT scan is negative.  X-rays negative.  EKG reassuring.  CBC is normal.  Chemistry is reassuring.  Troponin negative.  Urinalysis is normal with no infection.  Given the patient's reassuring work-up, wife states he is at baseline.  We will discharge patient home with PCP follow-up.  Wife states he will likely need EMS transport given his Parkinson's. ? ?FINAL CLINICAL IMPRESSION(S) / ED DIAGNOSES  ? ?Fall ?Weakness ? ? ?Note:  This document was prepared using Dragon voice recognition software and may include unintentional dictation errors. ?  ?Harvest Dark, MD ?11/14/21  1441 ? ?

## 2021-11-14 NOTE — ED Notes (Signed)
Pt placed on cardiac, pulse ox, BP monitors. ?

## 2021-11-14 NOTE — ED Notes (Addendum)
Per pts wife, pt has not been acting his normal self today. She thinks he may have taken too much of his medication accidentally. Pt is unable to ambulate with his walker today, when normally he can. Per wife, pt has been falling almost daily recently. Pt is noted to have multiple bruises & skin tears in various stages of healing to extremities. Per wife, pts right hand is broken (no splint/immobilizer in place at this time). Pt alert & able to answer all questions appropriately at this time. ?

## 2021-11-14 NOTE — ED Notes (Signed)
Pt transported to CT via stretcher with CT tech. °

## 2021-11-14 NOTE — ED Notes (Signed)
Xray tech at bedside.

## 2021-11-14 NOTE — ED Notes (Signed)
Pt given urinal.

## 2021-11-14 NOTE — ED Notes (Signed)
Pt transported back to pt room from CT via stretcher with CT tech. °

## 2021-11-14 NOTE — ED Notes (Signed)
Called [facility] for transport to [place] ?

## 2021-11-29 DIAGNOSIS — S62324D Displaced fracture of shaft of fourth metacarpal bone, right hand, subsequent encounter for fracture with routine healing: Secondary | ICD-10-CM | POA: Diagnosis not present

## 2021-11-29 DIAGNOSIS — M25542 Pain in joints of left hand: Secondary | ICD-10-CM | POA: Diagnosis not present

## 2021-12-03 DIAGNOSIS — M5136 Other intervertebral disc degeneration, lumbar region: Secondary | ICD-10-CM | POA: Diagnosis not present

## 2021-12-03 DIAGNOSIS — E118 Type 2 diabetes mellitus with unspecified complications: Secondary | ICD-10-CM | POA: Diagnosis not present

## 2021-12-03 DIAGNOSIS — E7800 Pure hypercholesterolemia, unspecified: Secondary | ICD-10-CM | POA: Diagnosis not present

## 2021-12-05 DIAGNOSIS — Z0000 Encounter for general adult medical examination without abnormal findings: Secondary | ICD-10-CM | POA: Diagnosis not present

## 2021-12-05 DIAGNOSIS — E118 Type 2 diabetes mellitus with unspecified complications: Secondary | ICD-10-CM | POA: Diagnosis not present

## 2021-12-05 DIAGNOSIS — G4710 Hypersomnia, unspecified: Secondary | ICD-10-CM | POA: Diagnosis not present

## 2021-12-05 DIAGNOSIS — B351 Tinea unguium: Secondary | ICD-10-CM | POA: Diagnosis not present

## 2021-12-05 DIAGNOSIS — E1169 Type 2 diabetes mellitus with other specified complication: Secondary | ICD-10-CM | POA: Diagnosis not present

## 2021-12-05 DIAGNOSIS — M5136 Other intervertebral disc degeneration, lumbar region: Secondary | ICD-10-CM | POA: Diagnosis not present

## 2021-12-05 DIAGNOSIS — E7800 Pure hypercholesterolemia, unspecified: Secondary | ICD-10-CM | POA: Diagnosis not present

## 2021-12-05 DIAGNOSIS — F3340 Major depressive disorder, recurrent, in remission, unspecified: Secondary | ICD-10-CM | POA: Diagnosis not present

## 2021-12-05 DIAGNOSIS — G20 Parkinson's disease: Secondary | ICD-10-CM | POA: Diagnosis not present

## 2021-12-05 DIAGNOSIS — I10 Essential (primary) hypertension: Secondary | ICD-10-CM | POA: Diagnosis not present

## 2021-12-05 DIAGNOSIS — F0280 Dementia in other diseases classified elsewhere without behavioral disturbance: Secondary | ICD-10-CM | POA: Diagnosis not present

## 2021-12-05 DIAGNOSIS — D62 Acute posthemorrhagic anemia: Secondary | ICD-10-CM | POA: Diagnosis not present

## 2021-12-05 DIAGNOSIS — G720 Drug-induced myopathy: Secondary | ICD-10-CM | POA: Diagnosis not present

## 2021-12-12 ENCOUNTER — Ambulatory Visit: Payer: PPO | Admitting: Dermatology

## 2021-12-12 DIAGNOSIS — D692 Other nonthrombocytopenic purpura: Secondary | ICD-10-CM

## 2021-12-12 DIAGNOSIS — L570 Actinic keratosis: Secondary | ICD-10-CM

## 2021-12-12 DIAGNOSIS — L578 Other skin changes due to chronic exposure to nonionizing radiation: Secondary | ICD-10-CM

## 2021-12-12 NOTE — Progress Notes (Signed)
? ?  Follow-Up Visit ?  ?Subjective  ?Randall [name] is a 71 y.o. male who presents for the following: Actinic Keratosis (6 month follow up of scalp treated with LN2 and PDT). ?The patient has spots, moles and lesions to be evaluated, some may be new or changing and the patient has concerns that these could be cancer. ? ?Accompanied by wife who contributes to history. ? ?The following portions of the chart were reviewed this encounter and updated as appropriate:  ? Tobacco  Allergies  Meds  Problems  Med Hx  Surg Hx  Fam Hx   ?  ? ?Review of Systems:  No other skin or systemic complaints except as noted in HPI or Assessment and Plan. ? ?Objective  ?Well appearing patient in no apparent distress; mood and affect are within normal limits. ? ?A focused examination was performed including scalp, face, arms. Relevant physical exam findings are noted in the Assessment and Plan. ? ?Scalp ?Erythematous thin papules/macules with gritty scale.  ? ? ?Assessment & Plan  ? ?Actinic Damage ?- chronic, secondary to cumulative UV radiation exposure/sun exposure over time ?- diffuse scaly erythematous macules with underlying dyspigmentation ?- Recommend daily broad spectrum sunscreen SPF 30+ to sun-exposed areas, reapply every 2 hours as needed.  ?- Recommend staying in the shade or wearing long sleeves, sun glasses (UVA+UVB protection) and wide brim hats (4-inch brim around the entire circumference of the hat). ?- Call for new or changing lesions. ? ?Purpura - Chronic; persistent and recurrent.  Treatable, but not curable. ?- Violaceous macules and patches ?- Benign ?- Related to trauma, age, sun damage and/or use of blood thinners, chronic use of topical and/or oral steroids ?- Observe ?- Can use OTC arnica containing moisturizer such as Dermend Bruise Formula if desired ?- Call for worsening or other concerns ? ?AK (actinic keratosis) ?Scalp ? ?Destruction of lesion - Scalp ?Complexity: simple   ?Destruction method:  cryotherapy   ?Informed consent: discussed and consent obtained   ?Timeout:  patient name, date of birth, surgical site, and procedure verified ?Lesion destroyed using liquid nitrogen: Yes   ?Region frozen until ice ball extended beyond lesion: Yes   ?Outcome: patient tolerated procedure well with no complications   ?Post-procedure details: wound care instructions given   ? ? ?Return in about 1 year (around 12/13/2022). ? ?I, Ashok Cordia, CMA, am acting as scribe for Sarina Ser, MD . ?Documentation: I have reviewed the above documentation for accuracy and completeness, and I agree with the above. ? ?Sarina Ser, MD ? ?

## 2021-12-12 NOTE — Patient Instructions (Signed)

## 2021-12-13 ENCOUNTER — Encounter: Payer: Self-pay | Admitting: Dermatology

## 2021-12-13 DIAGNOSIS — G319 Degenerative disease of nervous system, unspecified: Secondary | ICD-10-CM | POA: Diagnosis not present

## 2021-12-13 DIAGNOSIS — G20 Parkinson's disease: Secondary | ICD-10-CM | POA: Diagnosis not present

## 2021-12-28 NOTE — Progress Notes (Signed)
PC SW outreached patient and spouse returning their call. ? ?Wife shared that she was searching for home health companies that services their area and had received a few names from patients insurance already that  are in network ([facility], [facility] and [facility]). ? ?Wife states that patient is falling more, with his recent fall this morning, no injuries sustained, which is why she feels HH will be beneficial. Patient is having more night sweats due to parkinson's also. Spouse shares she is discussing this with patients neuro. ? ?PC to schedule an in home visit for May.  ?

## 2021-12-31 ENCOUNTER — Telehealth: Payer: Self-pay

## 2021-12-31 NOTE — Telephone Encounter (Signed)
PC SW outreached patient/spouse to schedule in person visit. ? ?Visit scheduled for 4/11 '@1'$ :45 pm with PC  SW/RN team. ?

## 2022-01-02 ENCOUNTER — Telehealth: Payer: Self-pay

## 2022-01-02 NOTE — Telephone Encounter (Signed)
1245 pm. Request received from [place], SW to follow up with patient's wife regarding constipation and diarrhea.  Return call made and spoke with [name].  We discussed issues with constipation.  Fluid intake has been poor.  We discussed increasing liquids and incorporating more fiber into his diet.  Earlier in the week, patient has some diarrhea.  Patient to imodium and issues resolved.  Patient advised he passed two round, hard stools and then had normal stools again.  If issues with constipation continue we will discuss stool softner with either PCP or NP with Palliative Care.  Follow up visit is scheduled for next month.  ?

## 2022-01-03 DIAGNOSIS — F0280 Dementia in other diseases classified elsewhere without behavioral disturbance: Secondary | ICD-10-CM | POA: Diagnosis not present

## 2022-01-03 DIAGNOSIS — M5136 Other intervertebral disc degeneration, lumbar region: Secondary | ICD-10-CM | POA: Diagnosis not present

## 2022-01-03 DIAGNOSIS — B351 Tinea unguium: Secondary | ICD-10-CM | POA: Diagnosis not present

## 2022-01-03 DIAGNOSIS — E1169 Type 2 diabetes mellitus with other specified complication: Secondary | ICD-10-CM | POA: Diagnosis not present

## 2022-01-03 DIAGNOSIS — D62 Acute posthemorrhagic anemia: Secondary | ICD-10-CM | POA: Diagnosis not present

## 2022-01-03 DIAGNOSIS — T466X5A Adverse effect of antihyperlipidemic and antiarteriosclerotic drugs, initial encounter: Secondary | ICD-10-CM | POA: Diagnosis not present

## 2022-01-03 DIAGNOSIS — E7800 Pure hypercholesterolemia, unspecified: Secondary | ICD-10-CM | POA: Diagnosis not present

## 2022-01-03 DIAGNOSIS — G20 Parkinson's disease: Secondary | ICD-10-CM | POA: Diagnosis not present

## 2022-01-03 DIAGNOSIS — F3340 Major depressive disorder, recurrent, in remission, unspecified: Secondary | ICD-10-CM | POA: Diagnosis not present

## 2022-01-03 DIAGNOSIS — I10 Essential (primary) hypertension: Secondary | ICD-10-CM | POA: Diagnosis not present

## 2022-01-03 DIAGNOSIS — G720 Drug-induced myopathy: Secondary | ICD-10-CM | POA: Diagnosis not present

## 2022-01-16 ENCOUNTER — Telehealth: Payer: Self-pay

## 2022-01-16 NOTE — Telephone Encounter (Signed)
Patient spouse called to reschedule PC visit from 5/11 due to having an appt with neuro at the same time. ? ?PC SW rescheduled PC visit for 6/1 '@1'$ :45PM. ?

## 2022-01-17 ENCOUNTER — Other Ambulatory Visit: Payer: PPO | Admitting: Primary Care

## 2022-01-17 DIAGNOSIS — G4733 Obstructive sleep apnea (adult) (pediatric): Secondary | ICD-10-CM | POA: Diagnosis not present

## 2022-01-17 DIAGNOSIS — F5104 Psychophysiologic insomnia: Secondary | ICD-10-CM | POA: Diagnosis not present

## 2022-01-17 DIAGNOSIS — F0280 Dementia in other diseases classified elsewhere without behavioral disturbance: Secondary | ICD-10-CM | POA: Diagnosis not present

## 2022-01-17 DIAGNOSIS — G20 Parkinson's disease: Secondary | ICD-10-CM | POA: Diagnosis not present

## 2022-01-17 DIAGNOSIS — F411 Generalized anxiety disorder: Secondary | ICD-10-CM | POA: Diagnosis not present

## 2022-01-17 DIAGNOSIS — R2689 Other abnormalities of gait and mobility: Secondary | ICD-10-CM | POA: Diagnosis not present

## 2022-01-21 DIAGNOSIS — L851 Acquired keratosis [keratoderma] palmaris et plantaris: Secondary | ICD-10-CM | POA: Diagnosis not present

## 2022-01-21 DIAGNOSIS — B351 Tinea unguium: Secondary | ICD-10-CM | POA: Diagnosis not present

## 2022-01-21 DIAGNOSIS — E1142 Type 2 diabetes mellitus with diabetic polyneuropathy: Secondary | ICD-10-CM | POA: Diagnosis not present

## 2022-01-29 ENCOUNTER — Other Ambulatory Visit: Payer: Self-pay

## 2022-01-29 ENCOUNTER — Emergency Department: Payer: PPO

## 2022-01-29 ENCOUNTER — Emergency Department
Admission: EM | Admit: 2022-01-29 | Discharge: 2022-01-29 | Disposition: A | Discharge status: Home / Self Care | Payer: PPO | Attending: Emergency Medicine | Admitting: Emergency Medicine

## 2022-01-29 DIAGNOSIS — M47812 Spondylosis without myelopathy or radiculopathy, cervical region: Secondary | ICD-10-CM | POA: Diagnosis not present

## 2022-01-29 DIAGNOSIS — G20 Parkinson's disease: Secondary | ICD-10-CM | POA: Insufficient documentation

## 2022-01-29 DIAGNOSIS — S80211A Abrasion, right knee, initial encounter: Secondary | ICD-10-CM | POA: Insufficient documentation

## 2022-01-29 DIAGNOSIS — M25512 Pain in left shoulder: Secondary | ICD-10-CM | POA: Insufficient documentation

## 2022-01-29 DIAGNOSIS — E119 Type 2 diabetes mellitus without complications: Secondary | ICD-10-CM | POA: Diagnosis not present

## 2022-01-29 DIAGNOSIS — S0512XA Contusion of eyeball and orbital tissues, left eye, initial encounter: Secondary | ICD-10-CM | POA: Insufficient documentation

## 2022-01-29 DIAGNOSIS — S299XXA Unspecified injury of thorax, initial encounter: Secondary | ICD-10-CM | POA: Diagnosis present

## 2022-01-29 DIAGNOSIS — M25562 Pain in left knee: Secondary | ICD-10-CM | POA: Diagnosis not present

## 2022-01-29 DIAGNOSIS — S2232XA Fracture of one rib, left side, initial encounter for closed fracture: Secondary | ICD-10-CM | POA: Diagnosis not present

## 2022-01-29 DIAGNOSIS — W19XXXA Unspecified fall, initial encounter: Secondary | ICD-10-CM | POA: Diagnosis not present

## 2022-01-29 DIAGNOSIS — S0990XA Unspecified injury of head, initial encounter: Secondary | ICD-10-CM | POA: Diagnosis not present

## 2022-01-29 DIAGNOSIS — R69 Illness, unspecified: Secondary | ICD-10-CM | POA: Diagnosis not present

## 2022-01-29 DIAGNOSIS — W101XXA Fall (on)(from) sidewalk curb, initial encounter: Secondary | ICD-10-CM | POA: Insufficient documentation

## 2022-01-29 DIAGNOSIS — R9431 Abnormal electrocardiogram [ECG] [EKG]: Secondary | ICD-10-CM | POA: Diagnosis not present

## 2022-01-29 DIAGNOSIS — R519 Headache, unspecified: Secondary | ICD-10-CM | POA: Diagnosis not present

## 2022-01-29 DIAGNOSIS — M542 Cervicalgia: Secondary | ICD-10-CM | POA: Diagnosis not present

## 2022-01-29 DIAGNOSIS — S4992XA Unspecified injury of left shoulder and upper arm, initial encounter: Secondary | ICD-10-CM | POA: Diagnosis not present

## 2022-01-29 DIAGNOSIS — I10 Essential (primary) hypertension: Secondary | ICD-10-CM | POA: Insufficient documentation

## 2022-01-29 DIAGNOSIS — S199XXA Unspecified injury of neck, initial encounter: Secondary | ICD-10-CM | POA: Diagnosis not present

## 2022-01-29 DIAGNOSIS — S2242XA Multiple fractures of ribs, left side, initial encounter for closed fracture: Secondary | ICD-10-CM | POA: Diagnosis not present

## 2022-01-29 DIAGNOSIS — S0181XA Laceration without foreign body of other part of head, initial encounter: Secondary | ICD-10-CM | POA: Diagnosis not present

## 2022-01-29 DIAGNOSIS — R918 Other nonspecific abnormal finding of lung field: Secondary | ICD-10-CM | POA: Diagnosis not present

## 2022-01-29 DIAGNOSIS — J984 Other disorders of lung: Secondary | ICD-10-CM | POA: Diagnosis not present

## 2022-01-29 DIAGNOSIS — R52 Pain, unspecified: Secondary | ICD-10-CM | POA: Diagnosis not present

## 2022-01-29 HISTORY — DX: Parkinson's disease: G20

## 2022-01-29 HISTORY — DX: Parkinson's disease without dyskinesia, without mention of fluctuations: G20.A1

## 2022-01-29 MED ORDER — AMOXICILLIN-POT CLAVULANATE 875-125 MG PO TABS: 1.0000 | ORAL_TABLET | Freq: Two times a day (BID) | ORAL | 0 refills | Status: AC

## 2022-01-29 NOTE — Discharge Instructions (Addendum)
-  Follow-up with your primary care provider within the next few days as discussed.  Follow-up with your neurologist as well, recommend physical therapy or occupational therapy for reduction of fall risk.  -Take all of antibiotics as prescribed.  You may take Tylenol/ibuprofen as needed for pain.  -Be sure to continue taking full breaths throughout the day to prevent pneumonia.  -Return to the emergency department anytime if you begin to experience any new or worsening symptoms.

## 2022-01-29 NOTE — ED Provider Notes (Signed)
[facility] Provider Note    Event Date/Time   First MD Initiated Contact with Patient 01/29/22 1134     (approximate)   History   Chief Complaint Fall   HPI Randall [name] is a 71 y.o. male, history of type 2 diabetes, hypertension, seizures, Parkinson disease, depression, presents to the emergency department via EMS from home for evaluation of injuries sustained from fall.  Reports falling off of a curb while taking out the trash earlier today.  Reports hitting the front left side of his head/face on the ground.  Denies LOC.  Denies blood thinner use, but is on aspirin.  Denies any symptoms prior to the fall.  Currently endorsing left shoulder pain, right knee pain, and mild headache.  Denies any other recent illnesses or injuries.  Denies fever/chills, chest pain, shortness of breath, abdominal pain, flank pain, nausea/vomiting, diarrhea, dysuria, vision changes, hearing changes, rash/lesions, or numbness/tingling in upper or lower extremities.   History Limitations: Parkinson disease, difficult to understand.        Physical Exam  Triage Vital Signs: ED Triage Vitals [01/29/22 1012]  Enc Vitals Group     BP (!) 142/70     Pulse Rate 62     Resp 16     Temp 97.7 F (36.5 C)     Temp Source Axillary     SpO2 97 %     Weight      Height      Head Circumference      Peak Flow      Pain Score      Pain Loc      Pain Edu?      Excl. in [place]?     Most recent vital signs: Vitals:   01/29/22 1142 01/29/22 1325  BP:  (!) 144/73  Pulse:  65  Resp:  17  Temp: 97.9 F (36.6 C) 98 F (36.7 C)  SpO2:  96%    General: Awake, NAD.  Skin: Warm, dry. No rashes or lesions.  Eyes: PERRL. Conjunctivae normal.  CV: Good peripheral perfusion.  Resp: Normal effort.  Abd: Soft, non-tender. No distention.  Neuro: At baseline. No gross neurological deficits.  Baseline upper extremity tremor present.  Focused Exam: Abrasion appreciated along the left side  of the forehead, just above the left eyebrow.  Ecchymosis under the left orbit.  Mild tenderness.  No cervical spine tenderness or step-off.  No gross deformities to the left upper extremity.  Mild bony tenderness along the left shoulder.  Normal range of motion.  Pulse, motor, sensation intact distally.  No gross forms to the left lower extremity.  Mild abrasion appreciated along the patellar region.  No active bleeding.  Normal range of motion.  Unremarkable straight leg test.  Physical Exam    ED Results / Procedures / Treatments  Labs (all labs ordered are listed, but only abnormal results are displayed) Labs Reviewed - No data to display   EKG Sinus rhythm, rate of 61, occasional PVC, no ST segment elevations or depressions, no AV blocks, no axis deviations, artifact present due to parkinsonian tremor.   RADIOLOGY  ED Provider Interpretation: I personally reviewed and interpreted these images.  Chest x-ray shows bibasilar lung densities.  Left shoulder x-ray shows no acute upper extremity fractures, but does show possible rib fracture.  Head CT shows no acute abnormalities.  CT cervical spine shows no cervical fractures.  Knee x-ray unremarkable.  Maxillofacial CT does not show any  acute fractures.  DG Chest 2 View  Result Date: 01/29/2022 CLINICAL DATA:  Fall, concern for rib fractures EXAM: CHEST - 2 VIEW COMPARISON:  Shoulder film same day FINDINGS: Normal cardiac silhouette. Low lung volumes. Posterior LEFT rib fracture seen on comparison shoulder radiograph is not well demonstrated. There is bibasilar lung opacities . No RIGHT rib fracture identified. No pneumothorax IMPRESSION: 1. Posterior LEFT rib fracture identified on same day LEFT shoulder radiograph is not demonstrated on chest radiograph. 2. No evidence of pneumothorax. 3. Bibasilar lung densities could represent atelectasis, contusion, or infiltrates. Electronically Signed   By: Suzy Bouchard M.D.   On: 01/29/2022  13:13   CT Head Wo Contrast  Result Date: 01/29/2022 CLINICAL DATA:  Head trauma, moderate-severe; Neck trauma (Age >= 65y); Facial trauma, blunt EXAM: CT HEAD WITHOUT CONTRAST CT MAXILLOFACIAL WITHOUT CONTRAST CT CERVICAL SPINE WITHOUT CONTRAST TECHNIQUE: Multidetector CT imaging of the head, cervical spine, and maxillofacial structures were performed using the standard protocol without intravenous contrast. Multiplanar CT image reconstructions of the cervical spine and maxillofacial structures were also generated. RADIATION DOSE REDUCTION: This exam was performed according to the departmental dose-optimization program which includes automated exposure control, adjustment of the mA and/or kV according to patient size and/or use of iterative reconstruction technique. COMPARISON:  CT head March 8, 23. FINDINGS: CT HEAD FINDINGS Brain: No evidence of acute infarction, hemorrhage, hydrocephalus, extra-axial collection or mass lesion/mass effect. Vascular: No hyperdense vessel identified. Skull: Left forehead contusion/laceration.  No acute fracture. Other: No mastoid effusions. CT MAXILLOFACIAL FINDINGS Osseous: No fracture or mandibular dislocation. Right TMJ degenerative change. Orbits: Periorbital contusion.  Otherwise, no traumatic findings. Sinuses: Near complete left maxillary sinus opacification with mucosal thickening Soft tissues: Left periorbital/forehead contusion. CT CERVICAL SPINE FINDINGS Alignment: No substantial sagittal subluxation. Skull base and vertebrae: Vertebral body heights are maintained. No evidence of acute fracture. Soft tissues and spinal canal: No prevertebral fluid or swelling. No visible canal hematoma. Disc levels: Multilevel facet uncovertebral hypertrophy, greatest and severe on the right at C3-C4. Resulting varying degrees of neural foraminal stenosis. Multilevel degenerative disc disease is mild. Upper chest: Visualized lung apices are clear. IMPRESSION: CT head and CT  maxillofacial: 1. No evidence of acute intracranial abnormality. 2. Left periorbital/forehead contusion without acute fracture. 3. Near complete left maxillary sinus opacification. CT cervical spine 1. No evidence of acute fracture or traumatic malalignment. 2. Multilevel degenerative change, described above. Electronically Signed   By: Margaretha Sheffield M.D.   On: 01/29/2022 12:21   CT Cervical Spine Wo Contrast  Result Date: 01/29/2022 CLINICAL DATA:  Head trauma, moderate-severe; Neck trauma (Age >= 65y); Facial trauma, blunt EXAM: CT HEAD WITHOUT CONTRAST CT MAXILLOFACIAL WITHOUT CONTRAST CT CERVICAL SPINE WITHOUT CONTRAST TECHNIQUE: Multidetector CT imaging of the head, cervical spine, and maxillofacial structures were performed using the standard protocol without intravenous contrast. Multiplanar CT image reconstructions of the cervical spine and maxillofacial structures were also generated. RADIATION DOSE REDUCTION: This exam was performed according to the departmental dose-optimization program which includes automated exposure control, adjustment of the mA and/or kV according to patient size and/or use of iterative reconstruction technique. COMPARISON:  CT head March 8, 23. FINDINGS: CT HEAD FINDINGS Brain: No evidence of acute infarction, hemorrhage, hydrocephalus, extra-axial collection or mass lesion/mass effect. Vascular: No hyperdense vessel identified. Skull: Left forehead contusion/laceration.  No acute fracture. Other: No mastoid effusions. CT MAXILLOFACIAL FINDINGS Osseous: No fracture or mandibular dislocation. Right TMJ degenerative change. Orbits: Periorbital contusion.  Otherwise, no traumatic findings. Sinuses:  Near complete left maxillary sinus opacification with mucosal thickening Soft tissues: Left periorbital/forehead contusion. CT CERVICAL SPINE FINDINGS Alignment: No substantial sagittal subluxation. Skull base and vertebrae: Vertebral body heights are maintained. No evidence of acute  fracture. Soft tissues and spinal canal: No prevertebral fluid or swelling. No visible canal hematoma. Disc levels: Multilevel facet uncovertebral hypertrophy, greatest and severe on the right at C3-C4. Resulting varying degrees of neural foraminal stenosis. Multilevel degenerative disc disease is mild. Upper chest: Visualized lung apices are clear. IMPRESSION: CT head and CT maxillofacial: 1. No evidence of acute intracranial abnormality. 2. Left periorbital/forehead contusion without acute fracture. 3. Near complete left maxillary sinus opacification. CT cervical spine 1. No evidence of acute fracture or traumatic malalignment. 2. Multilevel degenerative change, described above. Electronically Signed   By: Margaretha Sheffield M.D.   On: 01/29/2022 12:21   DG Shoulder Left  Result Date: 01/29/2022 CLINICAL DATA:  Tripped and fell with shoulder pain EXAM: LEFT SHOULDER - 2+ VIEW COMPARISON:  None FINDINGS: There is no evidence of fracture or dislocation. There is no evidence of arthropathy or other focal bone abnormality. Soft tissues are unremarkable. Acute fracture of the left posterior seventh rib. IMPRESSION: No acute shoulder region finding. Acute fracture of the left posterior seventh rib. No visible pneumothorax. Electronically Signed   By: Nelson Chimes M.D.   On: 01/29/2022 12:29   DG Knee Complete 4 Views Left  Result Date: 01/29/2022 CLINICAL DATA:  Provided history: Fall/pain. EXAM: LEFT KNEE - COMPLETE 4+ VIEW COMPARISON:  No pertinent prior exams available for comparison. FINDINGS: There is normal bony alignment. No evidence of acute osseous or articular abnormality. The joint spaces are maintained. IMPRESSION: No evidence of acute osseous or articular abnormality. Electronically Signed   By: Kellie Simmering D.O.   On: 01/29/2022 10:50   CT Maxillofacial Wo Contrast  Result Date: 01/29/2022 CLINICAL DATA:  Head trauma, moderate-severe; Neck trauma (Age >= 65y); Facial trauma, blunt EXAM: CT HEAD  WITHOUT CONTRAST CT MAXILLOFACIAL WITHOUT CONTRAST CT CERVICAL SPINE WITHOUT CONTRAST TECHNIQUE: Multidetector CT imaging of the head, cervical spine, and maxillofacial structures were performed using the standard protocol without intravenous contrast. Multiplanar CT image reconstructions of the cervical spine and maxillofacial structures were also generated. RADIATION DOSE REDUCTION: This exam was performed according to the departmental dose-optimization program which includes automated exposure control, adjustment of the mA and/or kV according to patient size and/or use of iterative reconstruction technique. COMPARISON:  CT head March 8, 23. FINDINGS: CT HEAD FINDINGS Brain: No evidence of acute infarction, hemorrhage, hydrocephalus, extra-axial collection or mass lesion/mass effect. Vascular: No hyperdense vessel identified. Skull: Left forehead contusion/laceration.  No acute fracture. Other: No mastoid effusions. CT MAXILLOFACIAL FINDINGS Osseous: No fracture or mandibular dislocation. Right TMJ degenerative change. Orbits: Periorbital contusion.  Otherwise, no traumatic findings. Sinuses: Near complete left maxillary sinus opacification with mucosal thickening Soft tissues: Left periorbital/forehead contusion. CT CERVICAL SPINE FINDINGS Alignment: No substantial sagittal subluxation. Skull base and vertebrae: Vertebral body heights are maintained. No evidence of acute fracture. Soft tissues and spinal canal: No prevertebral fluid or swelling. No visible canal hematoma. Disc levels: Multilevel facet uncovertebral hypertrophy, greatest and severe on the right at C3-C4. Resulting varying degrees of neural foraminal stenosis. Multilevel degenerative disc disease is mild. Upper chest: Visualized lung apices are clear. IMPRESSION: CT head and CT maxillofacial: 1. No evidence of acute intracranial abnormality. 2. Left periorbital/forehead contusion without acute fracture. 3. Near complete left maxillary sinus  opacification. CT cervical spine 1.  No evidence of acute fracture or traumatic malalignment. 2. Multilevel degenerative change, described above. Electronically Signed   By: Margaretha Sheffield M.D.   On: 01/29/2022 12:21    PROCEDURES:  Critical Care performed: N/A.  Procedures    MEDICATIONS ORDERED IN ED: Medications - No data to display   IMPRESSION / MDM / [place] / ED COURSE  I reviewed the triage vital signs and the nursing notes.                              Differential diagnosis includes, but is not limited to, concussion, epidural/subdural hematoma, cervical strain, cervical spine fracture, maxillofacial fracture, patella fracture, knee sprain, knee dislocation.  ED Course Patient appears well, vitals within normal limits.  NAD.  Not requesting any pain medication at this time.  Assessment/Plan Patient presents with injury sustained from mechanical fall.  Head/neck CT imaging has been reassuring for no acute injuries.  Left knee x-ray unremarkable.  Left shoulder x-ray unremarkable, though did show posterior rib fracture on left side, which was not appreciated on chest radiograph.  Likely old fracture.  Patient clinically appears well.  However, given his history of Parkinson's, difficulty eating, and history of fracture, we will go ahead and cover him for pneumonia with Augmentin.  Recommend that he follow-up with his primary care provider within the next few days, as well as his neurologist.  We will plan to discharge.  Considered admission for this patient, but given his stable presentation and unremarkable work-up, he is unlikely to benefit from admission.  Provided the patient with anticipatory guidance, return precautions, and educational material. Encouraged the patient to return to the emergency department at any time if they begin to experience any new or worsening symptoms. Patient expressed understanding and agreed with the plan.       FINAL CLINICAL  IMPRESSION(S) / ED DIAGNOSES   Final diagnoses:  Fall, initial encounter     Rx / DC Orders   ED Discharge Orders          Ordered    amoxicillin-clavulanate (AUGMENTIN) 875-125 MG tablet  2 times daily        01/29/22 1342             Note:  This document was prepared using Dragon voice recognition software and may include unintentional dictation errors.   Teodoro Spray, [place] 01/29/22 1346    Duffy Bruce, MD 02/09/22 [id number]

## 2022-01-29 NOTE — ED Triage Notes (Signed)
Pt comes into the ED via EMS from home with c/o tripped and fell while taking out the trash, hit his head, denies LOC, pt c/o right knee pain, mouth and fore head pain. Pt has noted abrasions with controlled bleeding, denies any neck pain.   150/83 HR75 100%RA [id number]

## 2022-02-07 ENCOUNTER — Other Ambulatory Visit: Payer: PPO

## 2022-02-07 ENCOUNTER — Other Ambulatory Visit: Payer: PPO | Admitting: Primary Care

## 2022-02-07 VITALS — BP 104/60 | HR 67 | Temp 98.1°F | Resp 18

## 2022-02-07 DIAGNOSIS — G20 Parkinson's disease: Secondary | ICD-10-CM | POA: Diagnosis not present

## 2022-02-07 DIAGNOSIS — M5136 Other intervertebral disc degeneration, lumbar region: Secondary | ICD-10-CM | POA: Diagnosis not present

## 2022-02-07 DIAGNOSIS — M51369 Other intervertebral disc degeneration, lumbar region without mention of lumbar back pain or lower extremity pain: Secondary | ICD-10-CM

## 2022-02-07 DIAGNOSIS — R296 Repeated falls: Secondary | ICD-10-CM | POA: Diagnosis not present

## 2022-02-07 DIAGNOSIS — Z515 Encounter for palliative care: Secondary | ICD-10-CM

## 2022-02-07 DIAGNOSIS — G20A1 Parkinson's disease without dyskinesia, without mention of fluctuations: Secondary | ICD-10-CM

## 2022-02-07 DIAGNOSIS — R5381 Other malaise: Secondary | ICD-10-CM | POA: Diagnosis not present

## 2022-02-07 NOTE — Progress Notes (Signed)
Therapist, nutritional Palliative Care Consult Note Telephone: [phone]  Fax: [phone]   Date of encounter: 02/07/22 1:57 PM PATIENT NAME: Randall [name] [address] [place] [address]   [phone] (home)  DOB: Jul 20, 1951 MRN: [id number] PRIMARY CARE PROVIDER:    Lynnea Ferrier, MD,  [address] [facility] [place] [address] [phone]  REFERRING PROVIDER:   Lynnea Ferrier, MD [address] [facility] [place],  [place] [address] [phone]  RESPONSIBLE PARTY:    Contact Information     Name Relation Home Work Mobile   [place] Spouse [phone]        I connected with  Randall [name] on 02/07/22 by a video enabled telemedicine application and verified that I am speaking with the correct person using two identifiers.   I discussed the limitations of evaluation and management by telemedicine. The patient expressed understanding and agreed to proceed.   I met face to face with patient and family in the home connecting with Willette Pa, RN. Palliative Care was asked to follow this patient by consultation request of  Lynnea Ferrier, MD to address advance care planning and complex medical decision making. This is the initial visit.                                     ASSESSMENT AND PLAN / RECOMMENDATIONS:   Advance Care Planning/Goals of Care: Goals include to maximize quality of life and symptom management. Patient/health care surrogate gave his/her permission to discuss.Our advance care planning conversation included a discussion about:     Exploration of personal, cultural or spiritual beliefs that might influence medical decisions  Exploration of goals of care in the event of a sudden injury or illness  Identification  of a healthcare agent -Wife Melba Review of an  advance directive document . CODE STATUS: Full  Symptom Management/Plan:  Constipation:  Patient having days where he is  going back and forth to the bathroom and sitting for long periods of time without a bowel movement.  Discussed use of miralax or senna s to address constipation. Also recommend a pro biotic which has been found to ease constipation in patients with dementia.   Debility:  Patient is having frequent falls.  Most recent on 01/29/22 resulting in an ED visit.  Patient was outside in in the carport when he fell.   Driver stopped to assist patient and activated 911.  Wife endorses patient is leaning more to the left side.  He is often using pillows to support himself when watching television.   Using a standard chair with bilateral arm rests when in the sitting room. Patient has a rolling walker that he mostly uses.  Wife cues him when needed.  HH services have been requested by Dr. Margaretmary Eddy office.  Wife is awaiting a call from agency.   Visual Hallucinations: Patient endorses seeing people in his front yard, bushes, and in the home.  Wife has been successful in talking patient through these episodes as well has providing medication when needed. Reviewed Pimavanserin (nuplazid) which is being introduced. Discussed perhaps these have worsened but too early to tell in the trial. Reviewed other antipsychotics and psychoactive medications.   Medication Management: Discussed at length medications and side effects with wife/patient.   Follow up Palliative Care Visit: Palliative care will continue to follow  for complex medical decision making, advance care planning, and clarification of goals. Return 6-8 weeks or prn.  I spent 60 minutes providing this consultation. More than 50% of the time in this consultation was spent in counseling and care coordination.  PPS: 40%  HOSPICE ELIGIBILITY/DIAGNOSIS: TBD  Chief Complaint: PD, falls, hallucinations  HISTORY OF PRESENT ILLNESS:  Randall [name] is a 71 y.o. year old male  with  PD, hallucinations, debility, falls.Patient seen today to review palliative care needs to  include medical decision making and advance care planning as appropriate.  History obtained from review of EMR, discussion with primary team, and interview with family, facility staff/caregiver and/or Mr. Swire.  I reviewed available labs, medications, imaging, studies and related documents from the EMR.  Records reviewed and summarized above.   ROS  General: NAD ENMT: denies dysphagia Cardiovascular: denies chest pain, denies DOE Pulmonary: denies cough, denies increased SOB Abdomen: endorses good appetite, + constipation, endorses continence of bowel GU: denies dysuria, endorses continence of urine MSK:  denies increased weakness, + falls reported Skin: denies rashes or wounds Neurological: denies pain, denies insomnia Psych: Endorses positive mood Heme/lymph/immuno: + bruises, abnormal bleeding  Physical Exam: Current and past weights: 169 lbs from PCP visit in March.  Constitutional: NAD General: frail appearing EYES: anicteric sclera, lids intact, no discharge  ENMT: intact hearing, oral mucous membranes moist, dentition intact CV: + LE edema, 2+ right pedal Pulmonary:no increased work of breathing, no cough, room air Abdomen: intake 75- 100%,  no ascites GU: deferred MSK: + sarcopenia, moves all extremities, ambulatory with a rolling walker Skin: warm and dry, no rashes or wounds on visible skin Neuro:  +  generalized weakness,  + cognitive impairment Psych: non-anxious affect, A and O x 2 Hem/lymph/immuno: no widespread bruising CURRENT PROBLEM LIST:  Patient Active Problem List   Diagnosis Date Noted   Adenoma of large intestine 02/01/2016   Clinical depression 02/01/2016   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   Benign essential HTN 02/01/2016   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/2016   Pure hypercholesterolemia 02/01/2016   Type 2 diabetes mellitus ([place]) 01/10/2016   Microalbuminuria 01/10/2016   Degeneration of intervertebral disc of lumbar  region 07/13/2015   Parkinson's disease ([place]) 11/16/2012   PAST MEDICAL HISTORY:  Active Ambulatory Problems    Diagnosis Date Noted   Adenoma of large intestine 02/01/2016   Type 2 diabetes mellitus ([place]) 01/10/2016   Degeneration of intervertebral disc of lumbar region 07/13/2015   Clinical depression 02/01/2016   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   Benign essential HTN 02/01/2016   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/2016   Microalbuminuria 01/10/2016   Parkinson's disease ([place]) 11/16/2012   Pure hypercholesterolemia 02/01/2016   Resolved Ambulatory Problems    Diagnosis Date Noted   No Resolved Ambulatory Problems   Past Medical History:  Diagnosis Date   Actinic keratosis    Allergic    Colon adenoma    Depression    Diabetes mellitus, type II ([place])    Diverticulosis    Hypertension    Parkinson disease ([place])    Seizures ([place])    Sleep apnea    Urinary hesitancy    SOCIAL HX:  Social History   Tobacco Use   Smoking status: Never   Smokeless tobacco: Never  Substance Use Topics   Alcohol use: Yes    Alcohol/week: 1.0 standard drink    Types: 1 Cans of beer per week  Comment: socially    FAMILY HX:  Family History  Problem Relation Age of Onset   Depression Mother    Cancer - Colon Mother    Heart attack Father    Anuerysm Father    Alcohol abuse Brother    Diabetes Brother       ALLERGIES:  Allergies  Allergen Reactions   Sulfa Antibiotics Nausea Only   Sulfasalazine Nausea Only   Lovastatin Other (See Comments)    intolerant   Methylphenidate Hcl Other (See Comments)    dyskinesia   Pravastatin Other (See Comments)    intolerant     PERTINENT MEDICATIONS:  Outpatient Encounter Medications as of 02/07/2022  Medication Sig   aspirin 81 MG chewable tablet Chew by mouth. (Patient not taking: Reported on 07/17/2021)   atropine 1 % ophthalmic solution One drop under tongue as needed for drooling.   azelastine (ASTELIN) 0.1 %  nasal spray Place into the nose.   carbidopa-levodopa (SINEMET IR) 25-250 MG tablet Take 25-250 tablets by mouth 4 (four) times daily.   escitalopram (LEXAPRO) 10 MG tablet START WITH HALF TABLET ONCE DAILY FOR TWO WEEKS, THEN INCREASE TO A WHOLE TABLET.   fluticasone (FLONASE) 50 MCG/ACT nasal spray    glimepiride (AMARYL) 4 MG tablet Take 4 mg by mouth 2 (two) times daily.   glucose blood test strip    ibuprofen (ADVIL,MOTRIN) 200 MG tablet Take 200 mg by mouth every 6 (six) hours as needed.   KYNMOBI 10 MG FILM SMARTSIG:10 Milligram(s) Sublingual Daily PRN   losartan (COZAAR) 50 MG tablet Take 50 mg by mouth.   lovastatin (MEVACOR) 40 MG tablet Take 40 mg by mouth.   Melatonin 3 MG TABS Take by mouth.   metFORMIN (GLUCOPHAGE) 500 MG tablet Take 500 mg by mouth 2 (two) times daily.   Multiple Vitamin (MULTI-VITAMINS) TABS Take by mouth.   rOPINIRole (REQUIP) 2 MG tablet Take 1 tablet (2 mg total) by mouth at bedtime.   No facility-administered encounter medications on file as of 02/07/2022.   Thank you for the opportunity to participate in the care of Mr. Sipp.  The palliative care team will continue to follow. Please call our office at [phone] if we can be of additional assistance.   Lorenza Burton, RN ,  Jason Coop DNP, MPH, AGPCNP-BC, [facility]   COVID-19 PATIENT SCREENING TOOL Asked and negative response unless otherwise noted:  Have you had symptoms of covid, tested positive or been in contact with someone with symptoms/positive test in the past 5-10 days? No

## 2022-02-07 NOTE — Progress Notes (Signed)
COMMUNITY PALLIATIVE CARE SW NOTE  PATIENT NAME: Randall [name] DOB: 1950/10/11 MRN: [id number]  PRIMARY CARE PROVIDER: Adin Hector, MD  RESPONSIBLE PARTY:  Acct ID - Guarantor Home Phone Work Phone Relationship Acct Type  [id number] TRENTAN, TRIPPE [phone]  Self P/F     [place], [place], [place] [address]     PLAN OF CARE and INTERVENTIONS:           GOALS OF CARE/ ADVANCE CARE PLANNING: Goals include to maximize quality of life and symptom management. Our advance care planning conversation included a discussion about:    The value and importance of advance care planning  Review and updating or creation of an advance directive document.              Code status:  FULL CODE.    2.         Palliative care encounter: Palliative medicine team will continue to support patient, patient's family, and medical team. Visit consisted of counseling and education dealing with the complex and emotionally intense issues of symptom management and palliative care in the setting of serious and potentially life-threatening illness.   SW and RN met with patient and Georgina Snell (patient's wife) for monthly in home visit. Patient lives in one story home with spouse. Patient has progressing Parkinson disease.   Connected with PC NP - K. Smith via [employer] during visit as well.   Functional changes/updates: Patient has had a slight functional change/decline since his recent PC visit. Patient has had a number of falls since previous visit, with the most recent resulting in ED visit on 5/23. Patient sustained bruising to his L  eye, elbow and knee from fall. Patient has a shuffle gait and unsteady on his feet. Fall and safety precautions discussed. Patient continues to use RW to assist with ambulation at times.  Patient continues to be independent with all ADL's with MIN supervision from spouse. Spouse shared that patient received a test kit for [employer], on a grant which is for parkinson's to treat  stiffness and mobility.  Patient fell asleep during visit, this was abnormal for patient.    Spouse and patient share that patient is having more hallucination and patient is relating them to real life events which is causing some safety concerns such patient leaving the home without making spouse aware or attempting to fight someone who is not present. Patient is on Nuplacid to assist with the hallucinations, spouse is not seeing an improvement. Patient is a higher fall risk now and speech continues to be more unclear.       Psychosocial assessment: completed.   Home support: Patients spouse provides caregiver support. HH PT and CNA order sent to [facility] by Dr. [name], neurology, on 02/06/22.   Transportation: no needs.  Food: no needs.  Safety: SW provided spouse with life alert information, [facility] alert system.   No additional physical needs identified at this time. Ongoing support/resources will continued to be offered if needed.   Medical assessment: RN reviewed medications and took vitals.      SW discussed goals, reviewed care plan, provided emotional support, used active and reflective listening in the form of reciprocity emotional response. Questions and concerns were addressed. The patient/family was encouraged to call with any additional questions and/or concerns. PC Provided general support and encouragement, no other unmet needs identified. Will continue to follow.   3.         PATIENT/CAREGIVER EDUCATION/ COPING:  Appearance: well groomed, appropriate given situation  Mental Status: Alert/oriented. Eye Contact: Good. Able to engage in proper eye contact  Thought Process: rational  Thought Content: not assessed  Speech: fast unclear rate at times, volume, tone  Mood: Normal and calm Affect: Congruent to endorsed mood, full ranging Insight: normal Judgement: normal  Interaction Style: Cooperative  Patient A&O, patient engaged in fluent conversation and  answered all questions appropriately. Patients mood was jovial during visit today. Patient continues with S/S of depression and scored 4/14 on PHQ-9, indicating minimal depression, no treatment recommended. Patient takes Lexapro 10 mg once daily and remeron 7.5 mg at HS as well as Seroquel 67m once daily. Patient typically enjoys karoke, but has not seemed to go and participate lately.    4.         PERSONAL EMERGENCY PLAN:  Patient will call 9-1-1 for emergencies.     5.         COMMUNITY RESOURCES COORDINATION/ HEALTH CARE NAVIGATION:  Patient and children manages his care.   6.         FINANCIAL CONCERNS/NEEDS: None.                         Primary Health Insurance: HTA Medicare Secondary Health Insurance: N/A                          Prescription Coverage: Yes, no history of difficulty obtaining or affording prescriptions reported     SOCIAL HX:  Social History   Tobacco Use   Smoking status: Never   Smokeless tobacco: Never  Substance Use Topics   Alcohol use: Yes    Alcohol/week: 1.0 standard drink    Types: 1 Cans of beer per week    Comment: socially     CODE STATUS: FULL CODE ADVANCED DIRECTIVES: Y MOST FORM COMPLETE:  Y HOSPICE EDUCATION PROVIDED: N  PPS: Patient is SUP-MINA with all ADL's at this time. Patient is A&O  with fair insight and judgement.    Time spent: 45 min      [place]

## 2022-02-08 ENCOUNTER — Emergency Department: Payer: PPO

## 2022-02-08 ENCOUNTER — Emergency Department
Admission: EM | Admit: 2022-02-08 | Discharge: 2022-02-08 | Disposition: A | Discharge status: Home / Self Care | Payer: PPO | Attending: Emergency Medicine | Admitting: Emergency Medicine

## 2022-02-08 ENCOUNTER — Encounter: Payer: Self-pay | Admitting: Emergency Medicine

## 2022-02-08 ENCOUNTER — Other Ambulatory Visit: Payer: Self-pay

## 2022-02-08 DIAGNOSIS — R040 Epistaxis: Secondary | ICD-10-CM | POA: Diagnosis not present

## 2022-02-08 DIAGNOSIS — M76892 Other specified enthesopathies of left lower limb, excluding foot: Secondary | ICD-10-CM | POA: Diagnosis not present

## 2022-02-08 DIAGNOSIS — W06XXXA Fall from bed, initial encounter: Secondary | ICD-10-CM | POA: Diagnosis not present

## 2022-02-08 DIAGNOSIS — E119 Type 2 diabetes mellitus without complications: Secondary | ICD-10-CM | POA: Diagnosis not present

## 2022-02-08 DIAGNOSIS — S0990XA Unspecified injury of head, initial encounter: Secondary | ICD-10-CM | POA: Insufficient documentation

## 2022-02-08 DIAGNOSIS — G20 Parkinson's disease: Secondary | ICD-10-CM | POA: Diagnosis not present

## 2022-02-08 DIAGNOSIS — M7989 Other specified soft tissue disorders: Secondary | ICD-10-CM | POA: Diagnosis not present

## 2022-02-08 DIAGNOSIS — M1712 Unilateral primary osteoarthritis, left knee: Secondary | ICD-10-CM | POA: Diagnosis not present

## 2022-02-08 DIAGNOSIS — R609 Edema, unspecified: Secondary | ICD-10-CM | POA: Diagnosis not present

## 2022-02-08 DIAGNOSIS — W19XXXA Unspecified fall, initial encounter: Secondary | ICD-10-CM | POA: Diagnosis not present

## 2022-02-08 DIAGNOSIS — R569 Unspecified convulsions: Secondary | ICD-10-CM | POA: Diagnosis not present

## 2022-02-08 DIAGNOSIS — S199XXA Unspecified injury of neck, initial encounter: Secondary | ICD-10-CM | POA: Diagnosis not present

## 2022-02-08 LAB — CBC WITH DIFFERENTIAL/PLATELET
Abs Immature Granulocytes: 0.04 10*3/uL (ref 0.00–0.07)
Basophils Absolute: 0.1 10*3/uL (ref 0.0–0.1)
Basophils Relative: 0 %
Eosinophils Absolute: 0.4 10*3/uL (ref 0.0–0.5)
Eosinophils Relative: 3 %
HCT: 39.4 % (ref 39.0–52.0)
Hemoglobin: 12.7 g/dL — ABNORMAL LOW (ref 13.0–17.0)
Immature Granulocytes: 0 %
Lymphocytes Relative: 3 %
Lymphs Abs: 0.4 10*3/uL — ABNORMAL LOW (ref 0.7–4.0)
MCH: 29.5 pg (ref 26.0–34.0)
MCHC: 32.2 g/dL (ref 30.0–36.0)
MCV: 91.4 fL (ref 80.0–100.0)
Monocytes Absolute: 0.7 10*3/uL (ref 0.1–1.0)
Monocytes Relative: 5 %
Neutro Abs: 10.8 10*3/uL — ABNORMAL HIGH (ref 1.7–7.7)
Neutrophils Relative %: 89 %
Platelets: 344 10*3/uL (ref 150–400)
RBC: 4.31 MIL/uL (ref 4.22–5.81)
RDW: 12.9 % (ref 11.5–15.5)
Smear Review: NORMAL
WBC: 12.3 10*3/uL — ABNORMAL HIGH (ref 4.0–10.5)
nRBC: 0 % (ref 0.0–0.2)

## 2022-02-08 LAB — BASIC METABOLIC PANEL
Anion gap: 9 (ref 5–15)
BUN: 19 mg/dL (ref 8–23)
CO2: 30 mmol/L (ref 22–32)
Calcium: 9.7 mg/dL (ref 8.9–10.3)
Chloride: 92 mmol/L — ABNORMAL LOW (ref 98–111)
Creatinine, Ser: 1.12 mg/dL (ref 0.61–1.24)
GFR, Estimated: >60 mL/min (ref >60)
Glucose, Bld: 114 mg/dL — ABNORMAL HIGH (ref 70–99)
Potassium: 4.9 mmol/L (ref 3.5–5.1)
Sodium: 131 mmol/L — ABNORMAL LOW (ref 135–145)

## 2022-02-08 NOTE — ED Triage Notes (Signed)
Pt to ED via [facility] from home for fall. Pt unable to communicate clearly. Pt has bruising and scabbed area noted to the left side of his forehead. Pt also has swelling and scabs on his left knee. Pt is c/o knee and head pain. Pt is currently in NAD. Pt smells of urine, unsure if he urinated on himself when he fell.

## 2022-02-08 NOTE — ED Provider Notes (Signed)
[facility] Provider Note    Event Date/Time   First MD Initiated Contact with Patient 02/08/22 1153     (approximate)   History   Fall   HPI  Randall [name] is a 71 y.o. male with a history of Parkinson's, diabetes, seizures presents after a fall.  His wife reports that patient fell while trying to get out of bed, it was unwitnessed.  He has a history of frequent falls.  Patient denies any injuries.  He is not on blood thinners     Physical Exam   Triage Vital Signs: ED Triage Vitals [02/08/22 1052]  Enc Vitals Group     BP 133/83     Pulse Rate 72     Resp 16     Temp 98.3 F (36.8 C)     Temp Source Oral     SpO2 100 %     Weight      Height      Head Circumference      Peak Flow      Pain Score      Pain Loc      Pain Edu?      Excl. in [place]?     Most recent vital signs: Vitals:   02/08/22 1052 02/08/22 1126  BP: 133/83   Pulse: 72   Resp: 16   Temp: 98.3 F (36.8 C) 98.3 F (36.8 C)  SpO2: 100%      General: Awake, no distress.  CV:  Good peripheral perfusion.  Resp:  Normal effort.  Abd:  No distention.  Other:  Evidence of prior epistaxis, no chest wall tenderness, no back tenderness, no pain with axial load on both legs, normal range of motion of upper extremities.  No abdominal tenderness   ED Results / Procedures / Treatments   Labs (all labs ordered are listed, but only abnormal results are displayed) Labs Reviewed  CBC WITH DIFFERENTIAL/PLATELET - Abnormal; Notable for the following components:      Result Value   WBC 12.3 (*)    Hemoglobin 12.7 (*)    Neutro Abs 10.8 (*)    Lymphs Abs 0.4 (*)    All other components within normal limits  BASIC METABOLIC PANEL - Abnormal; Notable for the following components:   Sodium 131 (*)    Chloride 92 (*)    Glucose, Bld 114 (*)    All other components within normal limits     EKG     RADIOLOGY CT head, cervical spine read by radiology as unremarkable Knee  x-ray viewed interpret by me, no fracture    PROCEDURES:  Critical Care performed:   Procedures   MEDICATIONS ORDERED IN ED: Medications - No data to display   IMPRESSION / MDM / [place] / ED COURSE  I reviewed the triage vital signs and the nursing notes. Patient's presentation is most consistent with acute presentation with potential threat to life or bodily function.  Patient presents after unwitnessed fall as described above.  Exam is overall reassuring  Differential includes head injury, intracranial hemorrhage, lab electrolyte abnormalities  CT head obtained, CT cervical spine given unwitnessed fall, x-ray of the knee given mild bruising  Lab work is reassuring  Family is here now, they report he is essentially his baseline.,  Imaging is unremarkable, patient has palliative care at home.      FINAL CLINICAL IMPRESSION(S) / ED DIAGNOSES   Final diagnoses:  Fall in home, initial encounter  Injury of head, initial encounter     Rx / DC Orders   ED Discharge Orders     None        Note:  This document was prepared using Dragon voice recognition software and may include unintentional dictation errors.   Lavonia Drafts, MD 02/08/22 1227

## 2022-02-11 ENCOUNTER — Other Ambulatory Visit: Payer: PPO

## 2022-02-11 DIAGNOSIS — Z789 Other specified health status: Secondary | ICD-10-CM

## 2022-02-11 NOTE — Progress Notes (Signed)
PC SW returned patient/spouse TC.  Spouse shared that patient had another ED visit on Fri 6/2 due to falls.  SW discussed long term planning with patient and spouse to include ALF and private in home caregivers. Spouse shared that ultimately the goal Isi for patient to be at home as long possibly, but understands that patients safety needs to come first. Spouse stated she had spoken with someone at [facility] sometime ago and will outreach them again to inquire of their waiting list, if any. SW also suggested considering other ALF's. SW provided supportive and reflective listening and assured patient and spouse that PC SW will assist with which ever decision patient and spouse choose.   Spouse requested to discuss medications in more detail with PC NP. SW to make NP aware.

## 2022-02-12 ENCOUNTER — Other Ambulatory Visit: Payer: PPO | Admitting: Primary Care

## 2022-02-12 DIAGNOSIS — G20 Parkinson's disease: Secondary | ICD-10-CM

## 2022-02-12 DIAGNOSIS — G20A1 Parkinson's disease without dyskinesia, without mention of fluctuations: Secondary | ICD-10-CM

## 2022-02-12 DIAGNOSIS — Z515 Encounter for palliative care: Secondary | ICD-10-CM

## 2022-02-12 NOTE — Progress Notes (Signed)
[facility] Palliative Care Consult Note Telephone: [phone]  Fax: [phone]    Date of encounter: 02/12/22 PATIENT NAME: BREYSON KELM [place] [address]   [phone] (home)  DOB: November 29, 1950 MRN: [id number] PRIMARY CARE PROVIDER:    Adin Hector, MD,  [address] [place] [address] [phone]  REFERRING PROVIDER:   Adin Hector, MD [facility] [place],  [place] [address] [phone]  RESPONSIBLE PARTY:    Contact Information     Name Relation Home Work Mobile   [place] Spouse [phone]          Palliative Care was asked to follow this patient by consultation request of  Adin Hector, MD to address advance care planning.                                   ASSESSMENT AND PLAN / RECOMMENDATIONS:   Advance Care Planning/Goals of Care: Goals include to maximize quality of life and symptom management. Patient/health care surrogate gave his/her permission to discuss.Our advance care planning conversation included a discussion about:    The value and importance of advance care planning  Experiences with loved ones who have been seriously ill or have died  Exploration of personal, cultural or spiritual beliefs that might influence medical decisions  Exploration of goals of care in the event of a sudden injury or illness  Identification and preparation of a healthcare agent - wife Review and updating or creation of an  advance directive document .  CODE STATUS: FULL code Received a message wife wanted to talk to discuss medicines and care options by phone. I called and spoke with patient's wife and power of attorney. She outlined that he was weak and went to the emergency room last Friday, but felt that he was not given any treatment options. We discussed the fact that he is getting more episodes of rigidity, "off "times. This is leading to  more and more falls. We discussed again his Parkinson's meds as we had last week. He's got a complex regimen and so I have referred her again back to neurology for close follow up, especially with medication management.   We discussed the nature of this disease that it is chronic that it is progressive, and that is degenerative.  I provided education that while we will do all that we can to help slow the progression or maintain functionality that there  was going to be decline and we would help her with caregiving options. She stated that she has experience with a Estate agent for her mother, who was at [facility] [place]. I encouraged her to get in touch with an elder care lawyer for planning for finances, and so that she knows what options are available to her  For patient care. She stated she did not want to come home and find him dead one day. But she wants to keep him home as long as possible. We discussed in-home care as well.   We have reached out to neurology and discovered that they have put in a home health referral. I've let her know. This is a very short term  Program, however and that she will likely need to consider more caregiving as time goes on. I reviewed hospital ER records, no concerning labs. They did not run any sort  of urine culture or urinalysis however. Wife states patient has never had a urinary tract infection. I invited her to call the social work/ RN team any time that she needed clarification, and that I will get her on my visit schedule within the next 4 to 6 weeks . She was amenable to this plan and will call with any needs.    Follow up Palliative Care Visit: Palliative care will continue to follow for complex medical decision making, advance care planning, and clarification of goals. Return 6 weeks or prn.  I spent 30 minutes providing this consultation. More than 50% of the time in this consultation was spent in counseling and care coordination.  Thank you for the  opportunity to participate in the care of Mr. Shock.  The palliative care team will continue to follow. Please call our office at [phone] if we can be of additional assistance.   Jason Coop DNP, MPH, AGPCNP-BC, [facility]

## 2022-02-19 DIAGNOSIS — S62324D Displaced fracture of shaft of fourth metacarpal bone, right hand, subsequent encounter for fracture with routine healing: Secondary | ICD-10-CM | POA: Diagnosis not present

## 2022-03-18 DIAGNOSIS — M7042 Prepatellar bursitis, left knee: Secondary | ICD-10-CM | POA: Diagnosis not present

## 2022-03-18 DIAGNOSIS — E118 Type 2 diabetes mellitus with unspecified complications: Secondary | ICD-10-CM | POA: Diagnosis not present

## 2022-03-18 DIAGNOSIS — M7989 Other specified soft tissue disorders: Secondary | ICD-10-CM | POA: Diagnosis not present

## 2022-03-18 DIAGNOSIS — M1712 Unilateral primary osteoarthritis, left knee: Secondary | ICD-10-CM | POA: Diagnosis not present

## 2022-03-18 DIAGNOSIS — G20 Parkinson's disease: Secondary | ICD-10-CM | POA: Diagnosis not present

## 2022-04-04 DIAGNOSIS — E7800 Pure hypercholesterolemia, unspecified: Secondary | ICD-10-CM | POA: Diagnosis not present

## 2022-04-04 DIAGNOSIS — G720 Drug-induced myopathy: Secondary | ICD-10-CM | POA: Diagnosis not present

## 2022-04-04 DIAGNOSIS — B351 Tinea unguium: Secondary | ICD-10-CM | POA: Diagnosis not present

## 2022-04-04 DIAGNOSIS — G20 Parkinson's disease: Secondary | ICD-10-CM | POA: Diagnosis not present

## 2022-04-04 DIAGNOSIS — E118 Type 2 diabetes mellitus with unspecified complications: Secondary | ICD-10-CM | POA: Diagnosis not present

## 2022-04-04 DIAGNOSIS — F0280 Dementia in other diseases classified elsewhere without behavioral disturbance: Secondary | ICD-10-CM | POA: Diagnosis not present

## 2022-04-04 DIAGNOSIS — M5136 Other intervertebral disc degeneration, lumbar region: Secondary | ICD-10-CM | POA: Diagnosis not present

## 2022-04-04 DIAGNOSIS — E1169 Type 2 diabetes mellitus with other specified complication: Secondary | ICD-10-CM | POA: Diagnosis not present

## 2022-04-04 DIAGNOSIS — F3340 Major depressive disorder, recurrent, in remission, unspecified: Secondary | ICD-10-CM | POA: Diagnosis not present

## 2022-04-04 DIAGNOSIS — I10 Essential (primary) hypertension: Secondary | ICD-10-CM | POA: Diagnosis not present

## 2022-04-04 DIAGNOSIS — E611 Iron deficiency: Secondary | ICD-10-CM | POA: Diagnosis not present

## 2022-04-04 DIAGNOSIS — T466X5A Adverse effect of antihyperlipidemic and antiarteriosclerotic drugs, initial encounter: Secondary | ICD-10-CM | POA: Diagnosis not present

## 2022-04-09 ENCOUNTER — Telehealth: Payer: Self-pay

## 2022-04-09 NOTE — Telephone Encounter (Signed)
PC SW returned patient TC. Call unsuccessful. SW LVM.

## 2022-04-13 DIAGNOSIS — R1310 Dysphagia, unspecified: Secondary | ICD-10-CM | POA: Diagnosis not present

## 2022-04-13 DIAGNOSIS — G20 Parkinson's disease: Secondary | ICD-10-CM | POA: Diagnosis not present

## 2022-04-13 DIAGNOSIS — R413 Other amnesia: Secondary | ICD-10-CM | POA: Diagnosis not present

## 2022-04-13 DIAGNOSIS — M5136 Other intervertebral disc degeneration, lumbar region: Secondary | ICD-10-CM | POA: Diagnosis not present

## 2022-04-13 DIAGNOSIS — Z86010 Personal history of colonic polyps: Secondary | ICD-10-CM | POA: Diagnosis not present

## 2022-04-13 DIAGNOSIS — E119 Type 2 diabetes mellitus without complications: Secondary | ICD-10-CM | POA: Diagnosis not present

## 2022-04-13 DIAGNOSIS — K5730 Diverticulosis of large intestine without perforation or abscess without bleeding: Secondary | ICD-10-CM | POA: Diagnosis not present

## 2022-04-13 DIAGNOSIS — I10 Essential (primary) hypertension: Secondary | ICD-10-CM | POA: Diagnosis not present

## 2022-04-13 DIAGNOSIS — G4733 Obstructive sleep apnea (adult) (pediatric): Secondary | ICD-10-CM | POA: Diagnosis not present

## 2022-04-13 DIAGNOSIS — Z9181 History of falling: Secondary | ICD-10-CM | POA: Diagnosis not present

## 2022-04-13 DIAGNOSIS — Z7984 Long term (current) use of oral hypoglycemic drugs: Secondary | ICD-10-CM | POA: Diagnosis not present

## 2022-04-13 DIAGNOSIS — G4710 Hypersomnia, unspecified: Secondary | ICD-10-CM | POA: Diagnosis not present

## 2022-04-13 DIAGNOSIS — N529 Male erectile dysfunction, unspecified: Secondary | ICD-10-CM | POA: Diagnosis not present

## 2022-04-13 DIAGNOSIS — Z9089 Acquired absence of other organs: Secondary | ICD-10-CM | POA: Diagnosis not present

## 2022-04-13 DIAGNOSIS — G4700 Insomnia, unspecified: Secondary | ICD-10-CM | POA: Diagnosis not present

## 2022-04-13 DIAGNOSIS — H269 Unspecified cataract: Secondary | ICD-10-CM | POA: Diagnosis not present

## 2022-04-13 DIAGNOSIS — F0284 Dementia in other diseases classified elsewhere, unspecified severity, with anxiety: Secondary | ICD-10-CM | POA: Diagnosis not present

## 2022-04-13 DIAGNOSIS — E7849 Other hyperlipidemia: Secondary | ICD-10-CM | POA: Diagnosis not present

## 2022-04-13 DIAGNOSIS — F32A Depression, unspecified: Secondary | ICD-10-CM | POA: Diagnosis not present

## 2022-04-16 DIAGNOSIS — E7849 Other hyperlipidemia: Secondary | ICD-10-CM | POA: Diagnosis not present

## 2022-04-16 DIAGNOSIS — G4710 Hypersomnia, unspecified: Secondary | ICD-10-CM | POA: Diagnosis not present

## 2022-04-16 DIAGNOSIS — H269 Unspecified cataract: Secondary | ICD-10-CM | POA: Diagnosis not present

## 2022-04-16 DIAGNOSIS — N529 Male erectile dysfunction, unspecified: Secondary | ICD-10-CM | POA: Diagnosis not present

## 2022-04-16 DIAGNOSIS — G4733 Obstructive sleep apnea (adult) (pediatric): Secondary | ICD-10-CM | POA: Diagnosis not present

## 2022-04-16 DIAGNOSIS — F0284 Dementia in other diseases classified elsewhere, unspecified severity, with anxiety: Secondary | ICD-10-CM | POA: Diagnosis not present

## 2022-04-16 DIAGNOSIS — R1310 Dysphagia, unspecified: Secondary | ICD-10-CM | POA: Diagnosis not present

## 2022-04-16 DIAGNOSIS — E119 Type 2 diabetes mellitus without complications: Secondary | ICD-10-CM | POA: Diagnosis not present

## 2022-04-16 DIAGNOSIS — M5136 Other intervertebral disc degeneration, lumbar region: Secondary | ICD-10-CM | POA: Diagnosis not present

## 2022-04-16 DIAGNOSIS — K5730 Diverticulosis of large intestine without perforation or abscess without bleeding: Secondary | ICD-10-CM | POA: Diagnosis not present

## 2022-04-16 DIAGNOSIS — G4700 Insomnia, unspecified: Secondary | ICD-10-CM | POA: Diagnosis not present

## 2022-04-16 DIAGNOSIS — Z86010 Personal history of colonic polyps: Secondary | ICD-10-CM | POA: Diagnosis not present

## 2022-04-16 DIAGNOSIS — G20 Parkinson's disease: Secondary | ICD-10-CM | POA: Diagnosis not present

## 2022-04-16 DIAGNOSIS — I10 Essential (primary) hypertension: Secondary | ICD-10-CM | POA: Diagnosis not present

## 2022-04-16 DIAGNOSIS — Z9181 History of falling: Secondary | ICD-10-CM | POA: Diagnosis not present

## 2022-04-16 DIAGNOSIS — Z7984 Long term (current) use of oral hypoglycemic drugs: Secondary | ICD-10-CM | POA: Diagnosis not present

## 2022-04-16 DIAGNOSIS — R413 Other amnesia: Secondary | ICD-10-CM | POA: Diagnosis not present

## 2022-04-16 DIAGNOSIS — F32A Depression, unspecified: Secondary | ICD-10-CM | POA: Diagnosis not present

## 2022-04-16 DIAGNOSIS — Z9089 Acquired absence of other organs: Secondary | ICD-10-CM | POA: Diagnosis not present

## 2022-04-19 DIAGNOSIS — R443 Hallucinations, unspecified: Secondary | ICD-10-CM | POA: Diagnosis not present

## 2022-04-19 DIAGNOSIS — F0280 Dementia in other diseases classified elsewhere without behavioral disturbance: Secondary | ICD-10-CM | POA: Diagnosis not present

## 2022-04-19 DIAGNOSIS — F5101 Primary insomnia: Secondary | ICD-10-CM | POA: Diagnosis not present

## 2022-04-19 DIAGNOSIS — G20 Parkinson's disease: Secondary | ICD-10-CM | POA: Diagnosis not present

## 2022-04-19 DIAGNOSIS — R2689 Other abnormalities of gait and mobility: Secondary | ICD-10-CM | POA: Diagnosis not present

## 2022-04-19 DIAGNOSIS — F411 Generalized anxiety disorder: Secondary | ICD-10-CM | POA: Diagnosis not present

## 2022-04-19 DIAGNOSIS — F5104 Psychophysiologic insomnia: Secondary | ICD-10-CM | POA: Diagnosis not present

## 2022-04-24 DIAGNOSIS — E1142 Type 2 diabetes mellitus with diabetic polyneuropathy: Secondary | ICD-10-CM | POA: Diagnosis not present

## 2022-04-24 DIAGNOSIS — B351 Tinea unguium: Secondary | ICD-10-CM | POA: Diagnosis not present

## 2022-04-24 DIAGNOSIS — L851 Acquired keratosis [keratoderma] palmaris et plantaris: Secondary | ICD-10-CM | POA: Diagnosis not present

## 2022-05-10 DIAGNOSIS — G4700 Insomnia, unspecified: Secondary | ICD-10-CM | POA: Diagnosis not present

## 2022-05-10 DIAGNOSIS — Z9181 History of falling: Secondary | ICD-10-CM | POA: Diagnosis not present

## 2022-05-10 DIAGNOSIS — Z86010 Personal history of colonic polyps: Secondary | ICD-10-CM | POA: Diagnosis not present

## 2022-05-10 DIAGNOSIS — R1310 Dysphagia, unspecified: Secondary | ICD-10-CM | POA: Diagnosis not present

## 2022-05-10 DIAGNOSIS — Z7984 Long term (current) use of oral hypoglycemic drugs: Secondary | ICD-10-CM | POA: Diagnosis not present

## 2022-05-10 DIAGNOSIS — K5730 Diverticulosis of large intestine without perforation or abscess without bleeding: Secondary | ICD-10-CM | POA: Diagnosis not present

## 2022-05-10 DIAGNOSIS — E119 Type 2 diabetes mellitus without complications: Secondary | ICD-10-CM | POA: Diagnosis not present

## 2022-05-10 DIAGNOSIS — R413 Other amnesia: Secondary | ICD-10-CM | POA: Diagnosis not present

## 2022-05-10 DIAGNOSIS — N529 Male erectile dysfunction, unspecified: Secondary | ICD-10-CM | POA: Diagnosis not present

## 2022-05-10 DIAGNOSIS — E7849 Other hyperlipidemia: Secondary | ICD-10-CM | POA: Diagnosis not present

## 2022-05-10 DIAGNOSIS — I10 Essential (primary) hypertension: Secondary | ICD-10-CM | POA: Diagnosis not present

## 2022-05-10 DIAGNOSIS — Z9089 Acquired absence of other organs: Secondary | ICD-10-CM | POA: Diagnosis not present

## 2022-05-10 DIAGNOSIS — F0284 Dementia in other diseases classified elsewhere, unspecified severity, with anxiety: Secondary | ICD-10-CM | POA: Diagnosis not present

## 2022-05-10 DIAGNOSIS — G4710 Hypersomnia, unspecified: Secondary | ICD-10-CM | POA: Diagnosis not present

## 2022-05-10 DIAGNOSIS — G4733 Obstructive sleep apnea (adult) (pediatric): Secondary | ICD-10-CM | POA: Diagnosis not present

## 2022-05-10 DIAGNOSIS — G20 Parkinson's disease: Secondary | ICD-10-CM | POA: Diagnosis not present

## 2022-05-10 DIAGNOSIS — M5136 Other intervertebral disc degeneration, lumbar region: Secondary | ICD-10-CM | POA: Diagnosis not present

## 2022-05-10 DIAGNOSIS — F32A Depression, unspecified: Secondary | ICD-10-CM | POA: Diagnosis not present

## 2022-05-10 DIAGNOSIS — H269 Unspecified cataract: Secondary | ICD-10-CM | POA: Diagnosis not present

## 2022-05-14 DIAGNOSIS — K5730 Diverticulosis of large intestine without perforation or abscess without bleeding: Secondary | ICD-10-CM | POA: Diagnosis not present

## 2022-05-14 DIAGNOSIS — Z7984 Long term (current) use of oral hypoglycemic drugs: Secondary | ICD-10-CM | POA: Diagnosis not present

## 2022-05-14 DIAGNOSIS — M5136 Other intervertebral disc degeneration, lumbar region: Secondary | ICD-10-CM | POA: Diagnosis not present

## 2022-05-14 DIAGNOSIS — G4710 Hypersomnia, unspecified: Secondary | ICD-10-CM | POA: Diagnosis not present

## 2022-05-14 DIAGNOSIS — G4733 Obstructive sleep apnea (adult) (pediatric): Secondary | ICD-10-CM | POA: Diagnosis not present

## 2022-05-14 DIAGNOSIS — Z9181 History of falling: Secondary | ICD-10-CM | POA: Diagnosis not present

## 2022-05-14 DIAGNOSIS — F32A Depression, unspecified: Secondary | ICD-10-CM | POA: Diagnosis not present

## 2022-05-14 DIAGNOSIS — I10 Essential (primary) hypertension: Secondary | ICD-10-CM | POA: Diagnosis not present

## 2022-05-14 DIAGNOSIS — F0284 Dementia in other diseases classified elsewhere, unspecified severity, with anxiety: Secondary | ICD-10-CM | POA: Diagnosis not present

## 2022-05-14 DIAGNOSIS — Z9089 Acquired absence of other organs: Secondary | ICD-10-CM | POA: Diagnosis not present

## 2022-05-14 DIAGNOSIS — G4700 Insomnia, unspecified: Secondary | ICD-10-CM | POA: Diagnosis not present

## 2022-05-14 DIAGNOSIS — E119 Type 2 diabetes mellitus without complications: Secondary | ICD-10-CM | POA: Diagnosis not present

## 2022-05-14 DIAGNOSIS — N529 Male erectile dysfunction, unspecified: Secondary | ICD-10-CM | POA: Diagnosis not present

## 2022-05-14 DIAGNOSIS — Z86010 Personal history of colonic polyps: Secondary | ICD-10-CM | POA: Diagnosis not present

## 2022-05-14 DIAGNOSIS — G20 Parkinson's disease: Secondary | ICD-10-CM | POA: Diagnosis not present

## 2022-05-14 DIAGNOSIS — R413 Other amnesia: Secondary | ICD-10-CM | POA: Diagnosis not present

## 2022-05-14 DIAGNOSIS — E7849 Other hyperlipidemia: Secondary | ICD-10-CM | POA: Diagnosis not present

## 2022-05-14 DIAGNOSIS — R1310 Dysphagia, unspecified: Secondary | ICD-10-CM | POA: Diagnosis not present

## 2022-05-14 DIAGNOSIS — H269 Unspecified cataract: Secondary | ICD-10-CM | POA: Diagnosis not present

## 2022-05-17 ENCOUNTER — Encounter: Payer: Self-pay | Admitting: Internal Medicine

## 2022-05-17 ENCOUNTER — Other Ambulatory Visit: Payer: Self-pay

## 2022-05-17 ENCOUNTER — Emergency Department: Payer: PPO

## 2022-05-17 ENCOUNTER — Inpatient Hospital Stay: Payer: PPO

## 2022-05-17 ENCOUNTER — Inpatient Hospital Stay
Admission: EM | Admit: 2022-05-17 | Discharge: 2022-06-19 | DRG: 565 | Disposition: A | Discharge status: Hospice | Payer: PPO | Attending: Internal Medicine | Admitting: Internal Medicine

## 2022-05-17 DIAGNOSIS — R410 Disorientation, unspecified: Secondary | ICD-10-CM | POA: Diagnosis not present

## 2022-05-17 DIAGNOSIS — R296 Repeated falls: Secondary | ICD-10-CM | POA: Diagnosis not present

## 2022-05-17 DIAGNOSIS — Z515 Encounter for palliative care: Secondary | ICD-10-CM | POA: Diagnosis not present

## 2022-05-17 DIAGNOSIS — Z9889 Other specified postprocedural states: Secondary | ICD-10-CM | POA: Diagnosis not present

## 2022-05-17 DIAGNOSIS — Z8249 Family history of ischemic heart disease and other diseases of the circulatory system: Secondary | ICD-10-CM

## 2022-05-17 DIAGNOSIS — E785 Hyperlipidemia, unspecified: Secondary | ICD-10-CM | POA: Diagnosis present

## 2022-05-17 DIAGNOSIS — W19XXXA Unspecified fall, initial encounter: Secondary | ICD-10-CM | POA: Diagnosis not present

## 2022-05-17 DIAGNOSIS — F05 Delirium due to known physiological condition: Secondary | ICD-10-CM | POA: Diagnosis not present

## 2022-05-17 DIAGNOSIS — R5381 Other malaise: Secondary | ICD-10-CM | POA: Diagnosis not present

## 2022-05-17 DIAGNOSIS — S2242XA Multiple fractures of ribs, left side, initial encounter for closed fracture: Secondary | ICD-10-CM | POA: Diagnosis not present

## 2022-05-17 DIAGNOSIS — Z882 Allergy status to sulfonamides status: Secondary | ICD-10-CM | POA: Diagnosis not present

## 2022-05-17 DIAGNOSIS — G4730 Sleep apnea, unspecified: Secondary | ICD-10-CM | POA: Diagnosis present

## 2022-05-17 DIAGNOSIS — R627 Adult failure to thrive: Secondary | ICD-10-CM | POA: Diagnosis not present

## 2022-05-17 DIAGNOSIS — Z833 Family history of diabetes mellitus: Secondary | ICD-10-CM

## 2022-05-17 DIAGNOSIS — M6282 Rhabdomyolysis: Secondary | ICD-10-CM | POA: Diagnosis present

## 2022-05-17 DIAGNOSIS — T796XXS Traumatic ischemia of muscle, sequela: Secondary | ICD-10-CM | POA: Diagnosis not present

## 2022-05-17 DIAGNOSIS — E1165 Type 2 diabetes mellitus with hyperglycemia: Secondary | ICD-10-CM | POA: Diagnosis present

## 2022-05-17 DIAGNOSIS — F02811 Dementia in other diseases classified elsewhere, unspecified severity, with agitation: Secondary | ICD-10-CM | POA: Diagnosis not present

## 2022-05-17 DIAGNOSIS — G20A1 Parkinson's disease without dyskinesia, without mention of fluctuations: Secondary | ICD-10-CM | POA: Diagnosis present

## 2022-05-17 DIAGNOSIS — M545 Low back pain, unspecified: Secondary | ICD-10-CM | POA: Diagnosis not present

## 2022-05-17 DIAGNOSIS — Z7982 Long term (current) use of aspirin: Secondary | ICD-10-CM

## 2022-05-17 DIAGNOSIS — M26601 Right temporomandibular joint disorder, unspecified: Secondary | ICD-10-CM | POA: Diagnosis not present

## 2022-05-17 DIAGNOSIS — R531 Weakness: Secondary | ICD-10-CM | POA: Diagnosis not present

## 2022-05-17 DIAGNOSIS — Z66 Do not resuscitate: Secondary | ICD-10-CM | POA: Diagnosis not present

## 2022-05-17 DIAGNOSIS — R17 Unspecified jaundice: Secondary | ICD-10-CM | POA: Diagnosis present

## 2022-05-17 DIAGNOSIS — S0990XA Unspecified injury of head, initial encounter: Secondary | ICD-10-CM | POA: Diagnosis not present

## 2022-05-17 DIAGNOSIS — Z79899 Other long term (current) drug therapy: Secondary | ICD-10-CM

## 2022-05-17 DIAGNOSIS — F03918 Unspecified dementia, unspecified severity, with other behavioral disturbance: Secondary | ICD-10-CM

## 2022-05-17 DIAGNOSIS — E1169 Type 2 diabetes mellitus with other specified complication: Secondary | ICD-10-CM

## 2022-05-17 DIAGNOSIS — Y92009 Unspecified place in unspecified non-institutional (private) residence as the place of occurrence of the external cause: Secondary | ICD-10-CM | POA: Diagnosis not present

## 2022-05-17 DIAGNOSIS — R471 Dysarthria and anarthria: Secondary | ICD-10-CM | POA: Diagnosis not present

## 2022-05-17 DIAGNOSIS — E119 Type 2 diabetes mellitus without complications: Secondary | ICD-10-CM

## 2022-05-17 DIAGNOSIS — R1313 Dysphagia, pharyngeal phase: Secondary | ICD-10-CM

## 2022-05-17 DIAGNOSIS — E871 Hypo-osmolality and hyponatremia: Secondary | ICD-10-CM

## 2022-05-17 DIAGNOSIS — F32A Depression, unspecified: Secondary | ICD-10-CM | POA: Diagnosis not present

## 2022-05-17 DIAGNOSIS — I10 Essential (primary) hypertension: Secondary | ICD-10-CM | POA: Diagnosis present

## 2022-05-17 DIAGNOSIS — Z789 Other specified health status: Secondary | ICD-10-CM | POA: Diagnosis not present

## 2022-05-17 DIAGNOSIS — E876 Hypokalemia: Secondary | ICD-10-CM | POA: Diagnosis not present

## 2022-05-17 DIAGNOSIS — Z743 Need for continuous supervision: Secondary | ICD-10-CM | POA: Diagnosis not present

## 2022-05-17 DIAGNOSIS — R4587 Impulsiveness: Secondary | ICD-10-CM | POA: Diagnosis not present

## 2022-05-17 DIAGNOSIS — Z888 Allergy status to other drugs, medicaments and biological substances status: Secondary | ICD-10-CM | POA: Diagnosis not present

## 2022-05-17 DIAGNOSIS — K5900 Constipation, unspecified: Secondary | ICD-10-CM

## 2022-05-17 DIAGNOSIS — D72829 Elevated white blood cell count, unspecified: Secondary | ICD-10-CM | POA: Diagnosis not present

## 2022-05-17 DIAGNOSIS — G20 Parkinson's disease: Secondary | ICD-10-CM | POA: Diagnosis not present

## 2022-05-17 DIAGNOSIS — Z7984 Long term (current) use of oral hypoglycemic drugs: Secondary | ICD-10-CM

## 2022-05-17 DIAGNOSIS — R7401 Elevation of levels of liver transaminase levels: Secondary | ICD-10-CM | POA: Diagnosis present

## 2022-05-17 DIAGNOSIS — Z751 Person awaiting admission to adequate facility elsewhere: Secondary | ICD-10-CM

## 2022-05-17 DIAGNOSIS — T796XXA Traumatic ischemia of muscle, initial encounter: Principal | ICD-10-CM | POA: Diagnosis present

## 2022-05-17 DIAGNOSIS — E222 Syndrome of inappropriate secretion of antidiuretic hormone: Secondary | ICD-10-CM | POA: Diagnosis present

## 2022-05-17 DIAGNOSIS — S199XXA Unspecified injury of neck, initial encounter: Secondary | ICD-10-CM | POA: Diagnosis not present

## 2022-05-17 DIAGNOSIS — R1310 Dysphagia, unspecified: Secondary | ICD-10-CM | POA: Diagnosis not present

## 2022-05-17 DIAGNOSIS — Z818 Family history of other mental and behavioral disorders: Secondary | ICD-10-CM

## 2022-05-17 DIAGNOSIS — M4312 Spondylolisthesis, cervical region: Secondary | ICD-10-CM | POA: Diagnosis not present

## 2022-05-17 DIAGNOSIS — M546 Pain in thoracic spine: Secondary | ICD-10-CM | POA: Diagnosis not present

## 2022-05-17 DIAGNOSIS — R0902 Hypoxemia: Secondary | ICD-10-CM | POA: Diagnosis not present

## 2022-05-17 LAB — TROPONIN I (HIGH SENSITIVITY)
Troponin I (High Sensitivity): 17 ng/L (ref <18)
Troponin I (High Sensitivity): 17 ng/L (ref <18)
Troponin I (High Sensitivity): 22 ng/L — ABNORMAL HIGH (ref <18)

## 2022-05-17 LAB — COMPREHENSIVE METABOLIC PANEL
ALT: 7 U/L (ref 0–44)
AST: 101 U/L — ABNORMAL HIGH (ref 15–41)
Albumin: 3.8 g/dL (ref 3.5–5.0)
Alkaline Phosphatase: 76 U/L (ref 38–126)
Anion gap: 9 (ref 5–15)
BUN: 18 mg/dL (ref 8–23)
CO2: 26 mmol/L (ref 22–32)
Calcium: 8.9 mg/dL (ref 8.9–10.3)
Chloride: 97 mmol/L — ABNORMAL LOW (ref 98–111)
Creatinine, Ser: 0.87 mg/dL (ref 0.61–1.24)
GFR, Estimated: >60 mL/min (ref >60)
Glucose, Bld: 128 mg/dL — ABNORMAL HIGH (ref 70–99)
Potassium: 4.1 mmol/L (ref 3.5–5.1)
Sodium: 132 mmol/L — ABNORMAL LOW (ref 135–145)
Total Bilirubin: 1.7 mg/dL — ABNORMAL HIGH (ref 0.3–1.2)
Total Protein: 6.2 g/dL — ABNORMAL LOW (ref 6.5–8.1)

## 2022-05-17 LAB — CBC WITH DIFFERENTIAL/PLATELET
Abs Immature Granulocytes: 0.09 10*3/uL — ABNORMAL HIGH (ref 0.00–0.07)
Basophils Absolute: 0 10*3/uL (ref 0.0–0.1)
Basophils Relative: 0 %
Eosinophils Absolute: 0 10*3/uL (ref 0.0–0.5)
Eosinophils Relative: 0 %
HCT: 37.6 % — ABNORMAL LOW (ref 39.0–52.0)
Hemoglobin: 12.3 g/dL — ABNORMAL LOW (ref 13.0–17.0)
Immature Granulocytes: 1 %
Lymphocytes Relative: 4 %
Lymphs Abs: 0.6 10*3/uL — ABNORMAL LOW (ref 0.7–4.0)
MCH: 29.4 pg (ref 26.0–34.0)
MCHC: 32.7 g/dL (ref 30.0–36.0)
MCV: 90 fL (ref 80.0–100.0)
Monocytes Absolute: 1 10*3/uL (ref 0.1–1.0)
Monocytes Relative: 6 %
Neutro Abs: 14.5 10*3/uL — ABNORMAL HIGH (ref 1.7–7.7)
Neutrophils Relative %: 89 %
Platelets: 301 10*3/uL (ref 150–400)
RBC: 4.18 MIL/uL — ABNORMAL LOW (ref 4.22–5.81)
RDW: 14.6 % (ref 11.5–15.5)
WBC: 16.2 10*3/uL — ABNORMAL HIGH (ref 4.0–10.5)
nRBC: 0 % (ref 0.0–0.2)

## 2022-05-17 LAB — GLUCOSE, CAPILLARY: Glucose-Capillary: 152 mg/dL — ABNORMAL HIGH (ref 70–99)

## 2022-05-17 LAB — CK: Total CK: 8278 U/L — ABNORMAL HIGH (ref 49–397)

## 2022-05-17 MED ORDER — ALBUTEROL SULFATE (2.5 MG/3ML) 0.083% IN NEBU
2.5000 mg | INHALATION_SOLUTION | Freq: Four times a day (QID) | RESPIRATORY_TRACT | Status: DC | PRN
  Administered 2022-06-16: 2.5 mg via RESPIRATORY_TRACT
  Filled 2022-05-17: qty 3

## 2022-05-17 MED ORDER — ENOXAPARIN SODIUM 40 MG/0.4ML IJ SOSY
40.0000 mg | PREFILLED_SYRINGE | INTRAMUSCULAR | Status: DC
  Administered 2022-05-17 – 2022-06-18 (×30): 40 mg via SUBCUTANEOUS
  Filled 2022-05-17 (×32): qty 0.4

## 2022-05-17 MED ORDER — PIMAVANSERIN TARTRATE 34 MG PO CAPS: 1.0000 | ORAL_CAPSULE | Freq: Every day | ORAL | Status: DC

## 2022-05-17 MED ORDER — INSULIN ASPART 100 UNIT/ML IJ SOLN
0.0000 [IU] | Freq: Every day | INTRAMUSCULAR | Status: DC
  Administered 2022-05-21 – 2022-05-24 (×3): 2 [IU] via SUBCUTANEOUS
  Filled 2022-05-17 (×3): qty 1

## 2022-05-17 MED ORDER — SODIUM CHLORIDE 0.9 % IV BOLUS
500.0000 mL | Freq: Once | INTRAVENOUS | Status: AC
  Administered 2022-05-17: 500 mL via INTRAVENOUS

## 2022-05-17 MED ORDER — ROPINIROLE HCL 1 MG PO TABS
2.0000 mg | ORAL_TABLET | Freq: Four times a day (QID) | ORAL | Status: DC
  Administered 2022-05-17 – 2022-05-24 (×28): 2 mg via ORAL
  Filled 2022-05-17 (×30): qty 2

## 2022-05-17 MED ORDER — ONDANSETRON HCL 4 MG PO TABS: 4.0000 mg | ORAL_TABLET | Freq: Four times a day (QID) | ORAL | Status: DC | PRN

## 2022-05-17 MED ORDER — ONDANSETRON HCL 4 MG/2ML IJ SOLN: 4.0000 mg | Freq: Four times a day (QID) | INTRAMUSCULAR | Status: DC | PRN

## 2022-05-17 MED ORDER — CARBIDOPA-LEVODOPA 25-250 MG PO TABS
2.0000 | ORAL_TABLET | Freq: Four times a day (QID) | ORAL | Status: DC
  Administered 2022-05-18 – 2022-05-19 (×6): 2 via ORAL
  Filled 2022-05-17 (×9): qty 2

## 2022-05-17 MED ORDER — ACETAMINOPHEN 650 MG RE SUPP: 650.0000 mg | Freq: Four times a day (QID) | RECTAL | Status: DC | PRN

## 2022-05-17 MED ORDER — APOMORPHINE HCL 15 MG SL FILM: 1.0000 | ORAL_FILM | Freq: Two times a day (BID) | SUBLINGUAL | Status: DC

## 2022-05-17 MED ORDER — SODIUM CHLORIDE 0.9 % IV BOLUS
1000.0000 mL | Freq: Once | INTRAVENOUS | Status: AC
  Administered 2022-05-17: 1000 mL via INTRAVENOUS

## 2022-05-17 MED ORDER — HYDRALAZINE HCL 20 MG/ML IJ SOLN
10.0000 mg | INTRAMUSCULAR | Status: DC | PRN
  Administered 2022-05-20: 10 mg via INTRAVENOUS
  Filled 2022-05-17: qty 1

## 2022-05-17 MED ORDER — SODIUM CHLORIDE 0.9% FLUSH
3.0000 mL | Freq: Two times a day (BID) | INTRAVENOUS | Status: DC
  Administered 2022-05-18 – 2022-06-08 (×37): 3 mL via INTRAVENOUS

## 2022-05-17 MED ORDER — SODIUM CHLORIDE 0.9 % IV SOLN: INTRAVENOUS | Status: DC

## 2022-05-17 MED ORDER — RIVASTIGMINE TARTRATE 1.5 MG PO CAPS
1.5000 mg | ORAL_CAPSULE | Freq: Two times a day (BID) | ORAL | Status: DC
  Administered 2022-05-18 – 2022-06-19 (×63): 1.5 mg via ORAL
  Filled 2022-05-17 (×68): qty 1

## 2022-05-17 MED ORDER — ACETAMINOPHEN 325 MG PO TABS
650.0000 mg | ORAL_TABLET | Freq: Four times a day (QID) | ORAL | Status: DC | PRN
  Administered 2022-05-19 – 2022-06-10 (×7): 650 mg via ORAL
  Filled 2022-05-17 (×9): qty 2

## 2022-05-17 MED ORDER — ROPINIROLE HCL 1 MG PO TABS
2.0000 mg | ORAL_TABLET | Freq: Every day | ORAL | Status: DC
Start: 2022-05-17 — End: 2022-05-17

## 2022-05-17 MED ORDER — QUETIAPINE FUMARATE 25 MG PO TABS
75.0000 mg | ORAL_TABLET | Freq: Every day | ORAL | Status: DC
  Administered 2022-05-17 – 2022-06-18 (×32): 75 mg via ORAL
  Filled 2022-05-17 (×33): qty 3

## 2022-05-17 MED ORDER — INSULIN ASPART 100 UNIT/ML IJ SOLN
0.0000 [IU] | Freq: Three times a day (TID) | INTRAMUSCULAR | Status: DC
  Administered 2022-05-21 – 2022-05-22 (×4): 1 [IU] via SUBCUTANEOUS
  Administered 2022-05-22: 2 [IU] via SUBCUTANEOUS
  Administered 2022-05-23 (×3): 1 [IU] via SUBCUTANEOUS
  Administered 2022-05-24 (×3): 2 [IU] via SUBCUTANEOUS
  Administered 2022-05-25: 1 [IU] via SUBCUTANEOUS
  Administered 2022-05-25: 2 [IU] via SUBCUTANEOUS
  Administered 2022-05-25 – 2022-05-26 (×3): 1 [IU] via SUBCUTANEOUS
  Administered 2022-05-26: 2 [IU] via SUBCUTANEOUS
  Filled 2022-05-17 (×17): qty 1

## 2022-05-17 NOTE — H&P (Signed)
History and Physical    Patient: Randall [name] JSE:[id number] DOB: 20-Nov-1950 DOA: 05/17/2022 DOS: the patient was seen and examined on 05/17/2022 PCP: Adin Hector, MD  Patient coming from: Home  Chief Complaint:  Chief Complaint  Patient presents with   Fall    Patient here today following a fall that occurred at at 03:00, his wife has been trying to help him up for several hours but was unable to so she called EMS (Patient was on the floor for approx. 12 hours); Patient does have a history of Parkinson's and dementia r/t Parkinson's; Per EMS, his cognition is at his baseline (GCS 14, confused, does speak but is difficult to understand); Denies pain at this time   HPI: Randall [name] is a 71 y.o. male with medical history significant of hypertension, hyperlipidemia, diabetes mellitus type 2, Parkinson's disease with dementia presents after having a fall at home.  The patient does not recall all the exact details and additional history is obtained from his wife who is present.  He had gotten out of bed around 3:20 AM this morning and was using his walker when he had fallen.  His wife notes that the night light that is usually on in the room was off at the time.  There was a cord that he may have tripped over possibly.  Patient reports that he did hit his head on the floor.  Unclear if he had any loss of consciousness.  His wife tried to assist him in getting up, but was unable.  Patient stated that he did not want her to call EMS.  He continued to try and get up for several hours until he finally allowed his wife to call EMS around 3 PM this afternoon.  Upon admission into the emergency department patient was noted to have stable vital signs.  Labs significant for WBC 16.2, hemoglobin 12.3, sodium 132, BUN 18, creatinine 0.87, AST 101,ALT 7, total bilirubin 1.7, and CK 8278.  CT scan of the head and cervical spine did not note any acute abnormality.  Urinalysis has not been collected yet.  Patient has  been given 1500 mL of normal saline IV fluid.    Review of Systems: As mentioned in the history of present illness. All other systems reviewed and are negative. Past Medical History:  Diagnosis Date   Actinic keratosis    Allergic    Colon adenoma    Depression    Diabetes mellitus, type II (HCC)    Diverticulosis    Hypertension    Parkinson disease ([place])    Seizures ([place])    Sleep apnea    Urinary hesitancy    Past Surgical History:  Procedure Laterality Date   COLON SURGERY     HERNIA REPAIR     UVULOPALATOPHARYNGOPLASTY, TONSILLECTOMY AND SEPTOPLASTY     Social History:  reports that he has never smoked. He has never used smokeless tobacco. He reports current alcohol use of about 1.0 standard drink of alcohol per week. He reports that he does not use drugs.  Allergies  Allergen Reactions   Sulfa Antibiotics Nausea Only   Sulfasalazine Nausea Only   Lovastatin Other (See Comments)    intolerant   Methylphenidate Hcl Other (See Comments)    dyskinesia   Pravastatin Other (See Comments)    intolerant    Family History  Problem Relation Age of Onset   Depression Mother    Cancer - Colon Mother    Heart attack  Father    Anuerysm Father    Alcohol abuse Brother    Diabetes Brother     Prior to Admission medications   Medication Sig Start Date End Date Taking? Authorizing Provider  aspirin 81 MG chewable tablet Chew by mouth. Patient not taking: Reported on 07/17/2021    [provider]  atropine 1 % ophthalmic solution One drop under tongue as needed for drooling. 08/21/18   [provider]  azelastine (ASTELIN) 0.1 % nasal spray Place into the nose. 10/27/15 10/13/17  [provider]  carbidopa-levodopa (SINEMET IR) 25-250 MG tablet Take 25-250 tablets by mouth 4 (four) times daily. 01/22/16   [provider]  escitalopram (LEXAPRO) 10 MG tablet START WITH HALF TABLET ONCE DAILY FOR TWO WEEKS, THEN INCREASE TO A WHOLE TABLET. 01/25/20    [provider]  fluticasone (FLONASE) 50 MCG/ACT nasal spray  06/27/15   [provider]  glimepiride (AMARYL) 4 MG tablet Take 4 mg by mouth 2 (two) times daily. 10/26/15   [provider]  glucose blood test strip  08/23/15   [provider]  ibuprofen (ADVIL,MOTRIN) 200 MG tablet Take 200 mg by mouth every 6 (six) hours as needed.    [provider]  KYNMOBI 10 MG FILM SMARTSIG:10 Milligram(s) Sublingual Daily PRN 04/23/20   [provider]  losartan (COZAAR) 50 MG tablet Take 50 mg by mouth.    [provider]  lovastatin (MEVACOR) 40 MG tablet Take 40 mg by mouth. 05/29/15   [provider]  Melatonin 3 MG TABS Take by mouth.    [provider]  metFORMIN (GLUCOPHAGE) 500 MG tablet Take 500 mg by mouth 2 (two) times daily. 01/04/16 10/13/17  [provider]  Multiple Vitamin (MULTI-VITAMINS) TABS Take by mouth.    [provider]  rOPINIRole (REQUIP) 2 MG tablet Take 1 tablet (2 mg total) by mouth at bedtime. 05/22/16 10/13/17  Rainey Pines, MD    Physical Exam: Vitals:   05/17/22 1648 05/17/22 1649 05/17/22 1730  BP: 118/69  123/79  Pulse: 72  65  Resp: 19  13  Temp: 97.7 F (36.5 C)    TempSrc: Oral    SpO2: 96%  99%  Weight:  67.1 kg   Height:  '5\' 6"'$  (1.676 m)      Constitutional: Elderly male no acute distress at this time Eyes: PERRL, lids and conjunctivae normal ENMT: Mucous membranes are dry.   Neck: normal, supple  Respiratory: clear to auscultation bilaterally, no wheezing, no crackles.   Cardiovascular: Regular rate and rhythm, no murmurs / rubs / gallops.    Abdomen: no tenderness, no masses palpated.  Bowel sounds positive.  Musculoskeletal: no clubbing / cyanosis. No joint deformity upper and lower extremities.   Stiffening of muscles of upper and lower extremities appreciated. Skin: Abrasions noted to bilateral lateral aspects of the lower legs.  Bruising noted of the  bilateral dorsal aspect of the hands.  Bruising and abrasion with bleeding from the left elbow Neurologic: CN 2-12 grossly intact.  Patient able to move all extremities.  Tremor present.   Psychiatric: Alert and oriented x person, place, and year.  Flat affect.  Data Reviewed: {Tip this will not be part of the note when signed- Document your independent interpretation of telemetry tracing, EKG, lab, Radiology test or any other diagnostic tests. Add any new diagnostic test ordered today. (Optional):26781} EKG revealed normal sinus rhythm at 77 bpm.  Reviewed labs, imaging, and pertinent records  as noted above in HPI.  Assessment and Plan: Rhabdomyolysis secondary to fall at home Patient presents after having a fall at home sometime around 3 AM this morning unable to get help for approximately 12 hours.  Labs significant for CK 8278.  At this time kidney function was noted to be normal.  Urinalysis had not been collected yet. -Admit to a telemetry bed -Strict intake and output -Strict bedrest for now -Daily monitoring of CK levels -Continue normal saline IV fluids at 150 mL/h as tolerated  Leukocytosis Acute.  WBC elevated at 16.2.  -Check chest x-ray -Follow-up urinalysis once able to be obtained -Recheck CBC tomorrow morning  Controlled diabetes mellitus type 2 On admission glucose 128.  Last hemoglobin A1c 6.2 on 04/04/2022. Home medication regimen includes  Amaryl. -Hypoglycemic protocols -Carb modified diet -Hold home oral medication regimen -CBGs before every meal and at bedtime with very sensitive SSI -Adjust regimen as needed  Transaminitis and hyperbilirubinemia Acute.  AST 101 and total bilirubin 1.7.  Liver dysfunction thought to be secondary to patient being in rhabdo. -Repeat CMP in a.m.  Parkinson's disease He is followed by Dr. [name] of neurology in the outpatient setting.  Most recent note from 8/11 with neurology reviewed which recommended carbidopa levodopa 25-250  mg 2 tablets 4 times daily, Ropinirole 2 mg 4 times daily, rivastigmine 1.5 mg twice daily, Seroquel 75 mg nightly for hallucinations. -Delirium precautions -Continue regimen as suggested by the last available note until med reconciliation completed    DVT prophylaxis: Lovenox Advance Care Planning:   Code Status: Full Code    Consults: None  Family Communication: Wife updated at bedside  Severity of Illness: The appropriate patient status for this patient is INPATIENT. Inpatient status is judged to be reasonable and necessary in order to provide the required intensity of service to ensure the patient's safety. The patient's presenting symptoms, physical exam findings, and initial radiographic and laboratory data in the context of their chronic comorbidities is felt to place them at high risk for further clinical deterioration. Furthermore, it is not anticipated that the patient will be medically stable for discharge from the hospital within 2 midnights of admission.   * I certify that at the point of admission it is my clinical judgment that the patient will require inpatient hospital care spanning beyond 2 midnights from the point of admission due to high intensity of service, high risk for further deterioration and high frequency of surveillance required.*  Author: Norval Morton, MD 05/17/2022 6:51 PM  For on call review [email or web address].

## 2022-05-17 NOTE — ED Triage Notes (Signed)
Patient here today following a fall that occurred at at 03:00, his wife has been trying to help him up for several hours but was unable to so she called EMS (Patient was on the floor for approx. 12 hours); Patient does have a history of Parkinson's and dementia r/t Parkinson's; Per EMS, his cognition is at his baseline (GCS 14, confused, does speak but is difficult to understand); Denies pain at this time

## 2022-05-17 NOTE — ED Provider Notes (Signed)
[facility] Provider Note    Event Date/Time   First MD Initiated Contact with Patient 05/17/22 1719     (approximate)   History   Fall (Patient here today following a fall that occurred at at 03:00, his wife has been trying to help him up for several hours but was unable to so she called EMS (Patient was on the floor for approx. 12 hours); Patient does have a history of Parkinson's and dementia r/t Parkinson's; Per EMS, his cognition is at his baseline (GCS 14, confused, does speak but is difficult to understand); Denies pain at this time)   HPI  Randall [name] is a 71 y.o. male with history of Parkinson disease, dementia, diabetes who presents after an apparent fall.  EMS reported that the patient fell around 3 AM and was unable to get up even with his wife's help until EMS was called this afternoon around 3 PM.  EMS reported that the patient's mental status that is at his baseline.  The patient is unable to give much history but denies any acute pain.    Physical Exam   Triage Vital Signs: ED Triage Vitals  Enc Vitals Group     BP 05/17/22 1648 118/69     Pulse Rate 05/17/22 1648 72     Resp 05/17/22 1648 19     Temp 05/17/22 1648 97.7 F (36.5 C)     Temp Source 05/17/22 1648 Oral     SpO2 05/17/22 1648 96 %     Weight 05/17/22 1649 147 lb 14.9 oz (67.1 kg)     Height 05/17/22 1649 '5\' 6"'$  (1.676 m)     Head Circumference --      Peak Flow --      Pain Score 05/17/22 1648 0     Pain Loc --      Pain Edu? --      Excl. in [place]? --     Most recent vital signs: Vitals:   05/17/22 1648 05/17/22 1730  BP: 118/69 123/79  Pulse: 72 65  Resp: 19 13  Temp: 97.7 F (36.5 C)   SpO2: 96% 99%     General: Alert, confused.  Dysarthric speech. CV:  Good peripheral perfusion.  Resp:  Normal effort.  Abd:  No distention.  Soft and nontender. Other:  EOMI.  PERRLA.  Motor intact in all extremities.  No midline spinal tenderness.  No peripheral edema.   Full range of motion bilateral hips and knees.   ED Results / Procedures / Treatments   Labs (all labs ordered are listed, but only abnormal results are displayed) Labs Reviewed  CBC WITH DIFFERENTIAL/PLATELET - Abnormal; Notable for the following components:      Result Value   WBC 16.2 (*)    RBC 4.18 (*)    Hemoglobin 12.3 (*)    HCT 37.6 (*)    Neutro Abs 14.5 (*)    Lymphs Abs 0.6 (*)    Abs Immature Granulocytes 0.09 (*)    All other components within normal limits  COMPREHENSIVE METABOLIC PANEL - Abnormal; Notable for the following components:   Sodium 132 (*)    Chloride 97 (*)    Glucose, Bld 128 (*)    Total Protein 6.2 (*)    AST 101 (*)    Total Bilirubin 1.7 (*)    All other components within normal limits  CK - Abnormal; Notable for the following components:   Total CK 8,278 (*)  All other components within normal limits  URINALYSIS, ROUTINE W REFLEX MICROSCOPIC  COMPREHENSIVE METABOLIC PANEL  CBC  CK  HEMOGLOBIN A1C  TROPONIN I (HIGH SENSITIVITY)  TROPONIN I (HIGH SENSITIVITY)  TROPONIN I (HIGH SENSITIVITY)     EKG  ED ECG REPORT I, Arta Silence, the attending physician, personally viewed and interpreted this ECG.  Date: 05/17/2022 EKG Time: 1635 Rate: 77 Rhythm: normal sinus rhythm QRS Axis: normal Intervals: normal ST/T Wave abnormalities: normal Narrative Interpretation: no evidence of acute ischemia    RADIOLOGY  CT head: I independently viewed and interpreted the images; there is no ICH.  Radiology report indicates no acute abnormalities.  CT cervical spine: No acute fracture.  PROCEDURES:  Critical Care performed: No  Procedures   MEDICATIONS ORDERED IN ED: Medications  sodium chloride 0.9 % bolus 1,000 mL (has no administration in time range)  enoxaparin (LOVENOX) injection 40 mg (has no administration in time range)  sodium chloride flush (NS) 0.9 % injection 3 mL (has no administration in time range)  0.9 %   sodium chloride infusion (has no administration in time range)  acetaminophen (TYLENOL) tablet 650 mg (has no administration in time range)    Or  acetaminophen (TYLENOL) suppository 650 mg (has no administration in time range)  ondansetron (ZOFRAN) tablet 4 mg (has no administration in time range)    Or  ondansetron (ZOFRAN) injection 4 mg (has no administration in time range)  albuterol (PROVENTIL) (2.5 MG/3ML) 0.083% nebulizer solution 2.5 mg (has no administration in time range)  insulin aspart (novoLOG) injection 0-6 Units (has no administration in time range)  insulin aspart (novoLOG) injection 0-5 Units (has no administration in time range)  sodium chloride 0.9 % bolus 500 mL (500 mLs Intravenous New Bag/Given 05/17/22 1813)     IMPRESSION / MDM / ASSESSMENT AND PLAN / ED COURSE  I reviewed the triage vital signs and the nursing notes.  71 year old male with PMH as noted above presents after being on the floor for approximately 12 hours after a fall early this morning.  He denies any acute pain but is not able to give much additional history.  I reviewed the past medical records.  The patient has several prior ED visits for falls.  His most recent outpatient neurology visit was on 8/11.  He is on carbidopa levodopa for Parkinson's as well as droperidol and Kynmobi.  On exam currently, the patient has no visible trauma and no midline spinal tenderness.  Neurologic exam is nonfocal.  Vital signs are normal.  Differential diagnosis includes, but is not limited to, dehydration, AKI, electrolyte abnormality, other metabolic disturbance, UTI or other infection, rhabdomyolysis, mechanical fall.  Patient's presentation is most consistent with acute presentation with potential threat to life or bodily function.  We will obtain CT head and cervical spine given the patient's limited exam, lab work-up, and reassess.  The patient is on the cardiac monitor to evaluate for evidence of arrhythmia  and/or significant heart rate changes.   ----------------------------------------- 7:02 PM on 05/17/2022 -----------------------------------------  Lab work-up is significant for rhabdomyolysis with a CK over 8000.  WBC count is elevated as well.  Urinalysis is pending.  Electrolytes are unremarkable.  Creatinine is normal.  I consulted Dr. Tamala Julian from the hospitalist service; based on our discussion he agrees to admit the patient.   FINAL CLINICAL IMPRESSION(S) / ED DIAGNOSES   Final diagnoses:  Non-traumatic rhabdomyolysis     Rx / DC Orders   ED Discharge Orders  None        Note:  This document was prepared using Dragon voice recognition software and may include unintentional dictation errors.    Arta Silence, MD 05/17/22 [id number]

## 2022-05-18 DIAGNOSIS — G20 Parkinson's disease: Secondary | ICD-10-CM | POA: Diagnosis not present

## 2022-05-18 DIAGNOSIS — T796XXA Traumatic ischemia of muscle, initial encounter: Secondary | ICD-10-CM | POA: Diagnosis not present

## 2022-05-18 DIAGNOSIS — Y92009 Unspecified place in unspecified non-institutional (private) residence as the place of occurrence of the external cause: Secondary | ICD-10-CM | POA: Diagnosis not present

## 2022-05-18 DIAGNOSIS — W19XXXA Unspecified fall, initial encounter: Secondary | ICD-10-CM | POA: Diagnosis not present

## 2022-05-18 LAB — COMPREHENSIVE METABOLIC PANEL
ALT: 27 U/L (ref 0–44)
AST: 98 U/L — ABNORMAL HIGH (ref 15–41)
Albumin: 3.7 g/dL (ref 3.5–5.0)
Alkaline Phosphatase: 67 U/L (ref 38–126)
Anion gap: 10 (ref 5–15)
BUN: 16 mg/dL (ref 8–23)
CO2: 22 mmol/L (ref 22–32)
Calcium: 8.5 mg/dL — ABNORMAL LOW (ref 8.9–10.3)
Chloride: 101 mmol/L (ref 98–111)
Creatinine, Ser: 0.88 mg/dL (ref 0.61–1.24)
GFR, Estimated: >60 mL/min (ref >60)
Glucose, Bld: 93 mg/dL (ref 70–99)
Potassium: 4.3 mmol/L (ref 3.5–5.1)
Sodium: 133 mmol/L — ABNORMAL LOW (ref 135–145)
Total Bilirubin: 1.8 mg/dL — ABNORMAL HIGH (ref 0.3–1.2)
Total Protein: 6.1 g/dL — ABNORMAL LOW (ref 6.5–8.1)

## 2022-05-18 LAB — CBC
HCT: 37.3 % — ABNORMAL LOW (ref 39.0–52.0)
Hemoglobin: 12.3 g/dL — ABNORMAL LOW (ref 13.0–17.0)
MCH: 29.4 pg (ref 26.0–34.0)
MCHC: 33 g/dL (ref 30.0–36.0)
MCV: 89 fL (ref 80.0–100.0)
Platelets: 297 10*3/uL (ref 150–400)
RBC: 4.19 MIL/uL — ABNORMAL LOW (ref 4.22–5.81)
RDW: 14.8 % (ref 11.5–15.5)
WBC: 8.2 10*3/uL (ref 4.0–10.5)
nRBC: 0 % (ref 0.0–0.2)

## 2022-05-18 LAB — HEMOGLOBIN A1C
Hgb A1c MFr Bld: 5.5 % (ref 4.8–5.6)
Mean Plasma Glucose: 111.15 mg/dL

## 2022-05-18 LAB — URINALYSIS, ROUTINE W REFLEX MICROSCOPIC
Bacteria, UA: NONE SEEN
Bilirubin Urine: NEGATIVE
Glucose, UA: NEGATIVE mg/dL
Ketones, ur: 5 mg/dL — AB
Leukocytes,Ua: NEGATIVE
Nitrite: NEGATIVE
Protein, ur: NEGATIVE mg/dL
Specific Gravity, Urine: 1.012 (ref 1.005–1.030)
Squamous Epithelial / HPF: NONE SEEN (ref 0–5)
pH: 5 (ref 5.0–8.0)

## 2022-05-18 LAB — GLUCOSE, CAPILLARY
Glucose-Capillary: 110 mg/dL — ABNORMAL HIGH (ref 70–99)
Glucose-Capillary: 144 mg/dL — ABNORMAL HIGH (ref 70–99)
Glucose-Capillary: 103 mg/dL — ABNORMAL HIGH (ref 70–99)
Glucose-Capillary: 113 mg/dL — ABNORMAL HIGH (ref 70–99)

## 2022-05-18 LAB — CK: Total CK: 4735 U/L — ABNORMAL HIGH (ref 49–397)

## 2022-05-18 NOTE — Progress Notes (Signed)
  Progress Note   Patient: Randall [name] QQV:[id number] DOB: 1950/12/12 DOA: 05/17/2022     1 DOS: the patient was seen and examined on 05/18/2022   Brief hospital course: Randall [name] is a 71 y.o. male with medical history significant of hypertension, hyperlipidemia, diabetes mellitus type 2, Parkinson's disease with dementia presents after having a fall at home.  Patient was on the floor for about 12 hours.  By time he arrived in the hospital, he was found to have significant rhabdomyolysis with CK level of 8278.  Renal function still normal.  He was placed on IV fluids.   Assessment and Plan: Traumatic rhabdomyolysis secondary to fall. Fall at home due to Parkinson disease. Parkinson disease. Patient did not have increased weakness prior to the fall, he has occasional fall from unstable gait.  Continue current treatment for his Parkinson disease. CK level significant elevated, continue IV fluids.  Renal function still stable. PT/OT ordered.  Type 2 diabetes. Recent A1c 6.2.  Sliding scale insulin.          Subjective:  Patient now has some weakness, no muscle pain. No short of breath.  No hypoxia.  Physical Exam: Vitals:   05/18/22 0135 05/18/22 0516 05/18/22 0843 05/18/22 1143  BP: 129/70 (!) 147/80 (!) 170/87 (!) 150/74  Pulse: 67 81 75 76  Resp: '18 16 16 18  '$ Temp: 98.7 F (37.1 C) 98.2 F (36.8 C) 98 F (36.7 C) 98.1 F (36.7 C)  TempSrc:      SpO2: 99% 99% 98% 96%  Weight:      Height:       General exam: Appears calm and comfortable  Respiratory system: Clear to auscultation. Respiratory effort normal. Cardiovascular system: S1 & S2 heard, RRR. No JVD, murmurs, rubs, gallops or clicks. No pedal edema. Gastrointestinal system: Abdomen is nondistended, soft and nontender. No organomegaly or masses felt. Normal bowel sounds heard. Central nervous system: Alert and oriented. No focal neurological deficits. Extremities: tremor Skin: No rashes, lesions or  ulcers Psychiatry: Judgement and insight appear normal. Mood & affect appropriate.   Data Reviewed:  Lab results reviewed.  Family Communication: Wife updated at bedside.  Disposition: Status is: Inpatient Remains inpatient appropriate because: Severity of disease, IV treatment.  Planned Discharge Destination:  TBD    Time spent: 35 minutes  Author: Sharen Hones, MD 05/18/2022 12:04 PM  For on call review [email or web address].

## 2022-05-18 NOTE — Hospital Course (Addendum)
Randall [name] is a 71 y.o. male with medical history significant of hypertension, hyperlipidemia, diabetes mellitus type 2, Parkinson's disease with dementia presents after having a fall at home.  Patient was on the floor for about 12 hours.  By time he arrived in the hospital, he was found to have significant rhabdomyolysis with CK level of 8278.  Renal function still normal.  He was placed on IV fluids. Patient CK level was normalized on 9/12.  However, patient developed some confusion as well as dysphagia.  PT/OT recommended nursing home placement.    On 05/24/2022 urine very dark.  Patient given a fluid bolus and urine cleared up a little bit.  No blood in the urine.  On 05/25/2022.  Patient was having facial tremor and difficulty eating and taking meds.  05/26/2022 through 05/28/2022.  The patient has not been eating very much.  His urine is dark.  Bilirubin slightly high.  Previous urine analysis did not show any blood in the urine.  The patient wants to get out of the hospital.  On 05/28/2022 and his wife chose a bed at [place] and insurance authorization is undergoing.

## 2022-05-19 DIAGNOSIS — G20 Parkinson's disease: Secondary | ICD-10-CM | POA: Diagnosis not present

## 2022-05-19 DIAGNOSIS — T796XXA Traumatic ischemia of muscle, initial encounter: Secondary | ICD-10-CM | POA: Diagnosis not present

## 2022-05-19 DIAGNOSIS — W19XXXA Unspecified fall, initial encounter: Secondary | ICD-10-CM | POA: Diagnosis not present

## 2022-05-19 DIAGNOSIS — Y92009 Unspecified place in unspecified non-institutional (private) residence as the place of occurrence of the external cause: Secondary | ICD-10-CM | POA: Diagnosis not present

## 2022-05-19 LAB — BASIC METABOLIC PANEL
Anion gap: 5 (ref 5–15)
BUN: 14 mg/dL (ref 8–23)
CO2: 27 mmol/L (ref 22–32)
Calcium: 8.3 mg/dL — ABNORMAL LOW (ref 8.9–10.3)
Chloride: 102 mmol/L (ref 98–111)
Creatinine, Ser: 0.76 mg/dL (ref 0.61–1.24)
GFR, Estimated: >60 mL/min (ref >60)
Glucose, Bld: 124 mg/dL — ABNORMAL HIGH (ref 70–99)
Potassium: 3.8 mmol/L (ref 3.5–5.1)
Sodium: 134 mmol/L — ABNORMAL LOW (ref 135–145)

## 2022-05-19 LAB — GLUCOSE, CAPILLARY
Glucose-Capillary: 148 mg/dL — ABNORMAL HIGH (ref 70–99)
Glucose-Capillary: 129 mg/dL — ABNORMAL HIGH (ref 70–99)
Glucose-Capillary: 120 mg/dL — ABNORMAL HIGH (ref 70–99)
Glucose-Capillary: 118 mg/dL — ABNORMAL HIGH (ref 70–99)

## 2022-05-19 LAB — CK: Total CK: 3013 U/L — ABNORMAL HIGH (ref 49–397)

## 2022-05-19 LAB — MAGNESIUM: Magnesium: 1.9 mg/dL (ref 1.7–2.4)

## 2022-05-19 MED ORDER — HALOPERIDOL LACTATE 5 MG/ML IJ SOLN: 1.0000 mg | Freq: Four times a day (QID) | INTRAMUSCULAR | Status: DC | PRN

## 2022-05-19 MED ORDER — ROPINIROLE HCL 1 MG PO TABS
2.0000 mg | ORAL_TABLET | Freq: Three times a day (TID) | ORAL | Status: DC | PRN
  Administered 2022-05-19: 2 mg via ORAL
  Filled 2022-05-19: qty 2

## 2022-05-19 MED ORDER — CARBIDOPA-LEVODOPA 25-250 MG PO TABS
2.0000 | ORAL_TABLET | Freq: Every day | ORAL | Status: DC
  Administered 2022-05-19 – 2022-06-19 (×150): 2 via ORAL
  Filled 2022-05-19 (×154): qty 2

## 2022-05-19 MED ORDER — HALOPERIDOL 0.5 MG PO TABS
1.0000 mg | ORAL_TABLET | Freq: Four times a day (QID) | ORAL | Status: DC | PRN
  Administered 2022-05-19: 1 mg via ORAL
  Filled 2022-05-19: qty 2

## 2022-05-19 NOTE — Progress Notes (Signed)
  Progress Note   Patient: Randall [name] [id number]:[id number] DOB: 07/04/1951 DOA: 05/17/2022     2 DOS: the patient was seen and examined on 05/19/2022   Brief hospital course: Randall [name] is a 71 y.o. male with medical history significant of hypertension, hyperlipidemia, diabetes mellitus type 2, Parkinson's disease with dementia presents after having a fall at home.  Patient was on the floor for about 12 hours.  By time he arrived in the hospital, he was found to have significant rhabdomyolysis with CK level of 8278.  Renal function still normal.  He was placed on IV fluids.   Assessment and Plan: Traumatic rhabdomyolysis secondary to fall. Fall at home due to Parkinson disease. Parkinson disease. Patient has been seen by PT today, did well, currently recommend home with home care.  Patient has some increased rigidity yesterday, increased Sinemet to 5 a day which is home dose.   Type 2 diabetes. Recent A1c 6.2.  Sliding scale insulin.          Subjective:  Patient doing well, denies any short of breath.  Has good appetite.  Was able to walk with physical therapy.  Physical Exam: Vitals:   05/18/22 1955 05/19/22 0340 05/19/22 0835 05/19/22 1212  BP: (!) 152/86 (!) 157/93 122/89 (!) 146/81  Pulse: (!) 59 (!) 57 62 62  Resp: '18 18 16 16  '$ Temp: 98.8 F (37.1 C) 98.5 F (36.9 C) (!) 97.4 F (36.3 C) 98.5 F (36.9 C)  TempSrc:  Oral    SpO2: 98% 99% 100% 100%  Weight:      Height:       General exam: Appears calm and comfortable  Respiratory system: Clear to auscultation. Respiratory effort normal. Cardiovascular system: S1 & S2 heard, RRR. No JVD, murmurs, rubs, gallops or clicks. No pedal edema. Gastrointestinal system: Abdomen is nondistended, soft and nontender. No organomegaly or masses felt. Normal bowel sounds heard. Central nervous system: Alert and oriented. No focal neurological deficits. Extremities: Symmetric 5 x 5 power. Skin: No rashes, lesions or ulcers Psychiatry:  Judgement and insight appear normal. Mood & affect appropriate.   Data Reviewed:  Lab results reviewed.  Family Communication: Wife updated at bedside.  Disposition: Status is: Inpatient Remains inpatient appropriate because: Verity of disease, still requiring IV fluids.  Planned Discharge Destination: Home with Home Health    Time spent: 29 minutes  Author: Sharen Hones, MD 05/19/2022 12:55 PM  For on call review [email or web address].

## 2022-05-19 NOTE — Evaluation (Signed)
Occupational Therapy Evaluation Patient Details Name: Randall [name] MRN: [id number] DOB: [birth year] Today's Date: 05/19/2022   History of Present Illness PT is a 71 year old male admitted after being found down on floor for 12 hours, admitted with significant rhabdomyolysis; PMH significant for  hypertension, hyperlipidemia, diabetes mellitus type 2, Parkinson's disease with dementia   Clinical Impression   Chart reviewed, pt greeted in room with wife present throughout. Pt is alert, oriented to self, place, grossly oriented to situation, not oriented to date. Pt presents with fair-poor safety awareness at time of evaluation, wife reports this is pt baseline at times depending on the day. Pt wife also reports pt physical ability vary during the day and he appears close to baseline. Pt wife also is using a RW right now with a R knee injury she reports happened on Friday. Pt is eager to return home. Pt presents with deficits in strength, endurance,activity tolerance, safety awareness affecting safe and optimal ADL completion. As pt is performing at/close to baseline, recommend resuming HHOT following discharge. Pt is left in bedside chair, NAD, all needs met. OT will follow acutely.      Recommendations for follow up therapy are one component of a multi-disciplinary discharge planning process, led by the attending physician.  Recommendations may be updated based on patient status, additional functional criteria and insurance authorization.   Follow Up Recommendations  Home health OT    Assistance Recommended at Discharge Frequent or constant Supervision/Assistance  Patient can return home with the following A little help with walking and/or transfers;A little help with bathing/dressing/bathroom;Assistance with cooking/housework;Direct supervision/assist for medications management    Functional Status Assessment  Patient has had a recent decline in their functional status and demonstrates the  ability to make significant improvements in function in a reasonable and predictable amount of time.  Equipment Recommendations  None recommended by OT (recommend mwc- however educated wife to work with [facility] therapist to determine best, long term fit for pt and wife)    Recommendations for Other Services       Precautions / Restrictions Precautions Precautions: Fall Restrictions Weight Bearing Restrictions: No      Mobility Bed Mobility               General bed mobility comments: NT pt in recliner pre/post session    Transfers Overall transfer level: Needs assistance Equipment used: Rolling walker (2 wheels) Transfers: Sit to/from Stand Sit to Stand: Min assist           General transfer comment: from chair level      Balance Overall balance assessment: Needs assistance, History of Falls Sitting-balance support: Feet supported, Bilateral upper extremity supported Sitting balance-Leahy Scale: Fair     Standing balance support: No upper extremity supported Standing balance-Leahy Scale: Fair                             ADL either performed or assessed with clinical judgement   ADL Overall ADL's : Needs assistance/impaired Eating/Feeding: Set up;Sitting   Grooming: Wash/dry hands;Standing;Supervision/safety Grooming Details (indicate cue type and reason): sink level with RW, intermittent vcs for safety             Lower Body Dressing: Minimal assistance   Toilet Transfer: Min guard;Minimal assistance;Ambulation;Rolling walker (2 wheels) Toilet Transfer Details (indicate cue type and reason): simulated with RW, intermittent vcs for safety Toileting- Clothing Manipulation and Hygiene: Moderate assistance;Sit to/from stand [place]  Manipulation Details (indicate cue type and reason): anticipated     Functional mobility during ADLs: Min guard;Minimal assistance;Rolling walker (2 wheels) (approx 10' in room with RW, intermittent vcs  for safety)       Vision Patient Visual Report: No change from baseline       Perception     Praxis      Pertinent Vitals/Pain Pain Assessment Pain Assessment: No/denies pain     Hand Dominance Right   Extremity/Trunk Assessment Upper Extremity Assessment Upper Extremity Assessment: Generalized weakness (significant tremor noted throughout)   Lower Extremity Assessment Lower Extremity Assessment: Generalized weakness       Communication Communication Communication: Expressive difficulties (mumbled speech)   Cognition Arousal/Alertness: Awake/alert Behavior During Therapy: WFL for tasks assessed/performed Overall Cognitive Status: Impaired/Different from baseline Area of Impairment: Orientation, Attention, Memory, Following commands, Awareness, Safety/judgement, Problem solving                 Orientation Level: Disoriented to, Time, Situation Current Attention Level: Sustained   Following Commands: Follows one step commands consistently, Follows one step commands with increased time Safety/Judgement: Decreased awareness of safety, Decreased awareness of deficits Awareness: Emergent Problem Solving: Slow processing, Requires verbal cues, Requires tactile cues       General Comments  vital signs stable throughout, wife reports pt has been less cooperative with safety this afternoon compared to this morning    Exercises Other Exercises Other Exercises: edu pt and wife re: role of OT, role of rehab, discharge recommendations, home safety, home set up, DME use, falls prevention   Shoulder Instructions      Home Living Family/patient expects to be discharged to:: Private residence Living Arrangements: Spouse/significant other Available Help at Discharge: Family;Available 24 hours/day Type of Home: House Home Access: Ramped entrance     Home Layout: One level     Bathroom Shower/Tub: Occupational psychologist: Standard Bathroom Accessibility:  Yes   Home Equipment: Conservation officer, nature (2 wheels);Rollator (4 wheels)          Prior Functioning/Environment               Mobility Comments: amb with RW with MOD I per pt/wife report, falls in the last month ADLs Comments: Pt generally MOD I with ADl, will require assist from wife at times depending on "how is day is going" per wife        OT Problem List: Decreased strength;Decreased activity tolerance;Decreased coordination;Impaired balance (sitting and/or standing);Decreased cognition;Decreased knowledge of use of DME or AE;Decreased safety awareness      OT Treatment/Interventions: Self-care/ADL training;Patient/family education;Therapeutic exercise;Balance training;Energy conservation;Therapeutic activities;DME and/or AE instruction    OT Goals(Current goals can be found in the care plan section) Acute Rehab OT Goals Patient Stated Goal: go home OT Goal Formulation: With patient/family Time For Goal Achievement: 06/02/22 Potential to Achieve Goals: Good ADL Goals Pt Will Perform Grooming: with supervision;sitting Pt Will Perform Lower Body Dressing: with supervision;sitting/lateral leans Pt Will Transfer to Toilet: with supervision;ambulating Pt Will Perform Toileting - Clothing Manipulation and hygiene: sit to/from stand  OT Frequency: Min 2X/week    Co-evaluation              AM-PAC OT "6 Clicks" Daily Activity     Outcome Measure Help from another person eating meals?: None Help from another person taking care of personal grooming?: None Help from another person toileting, which includes using toliet, bedpan, or urinal?: A Little Help from another person bathing (including washing, rinsing,  drying)?: A Little Help from another person to put on and taking off regular upper body clothing?: None Help from another person to put on and taking off regular lower body clothing?: A Little 6 Click Score: 21   End of Session Equipment Utilized During Treatment: Gait  belt;Rolling walker (2 wheels) Nurse Communication: Mobility status  Activity Tolerance: Patient tolerated treatment well Patient left: in chair;with call bell/phone within reach;with chair alarm set;with family/visitor present  OT Visit Diagnosis: Unsteadiness on feet (R26.81);Other abnormalities of gait and mobility (R26.89);History of falling (Z91.81)                Time: 9872-1587 OT Time Calculation (min): 19 min Charges:  OT General Charges $OT Visit: 1 Visit OT Evaluation $OT Eval Moderate Complexity: 1 Mod  Shanon Payor, OTD OTR/L  05/19/22, 3:54 PM

## 2022-05-19 NOTE — Evaluation (Signed)
Physical Therapy Evaluation Patient Details Name: Randall [name] MRN: [id number] DOB: Jun 10, 1951 Today's Date: 05/19/2022  History of Present Illness  Pt is a 71 y/o M admitted on 05/17/22 after presenting with c/o a fall at home & pt was on the floor for ~12 hours. Pt found to have significant rhabdomyolysis. PMH: HTN, HLD, DM2, Parkinson's disease, dementia  Clinical Impression  Pt seen for PT evaluation with pt's wife present, pt only AxO to self, which wife reports is different compared to pt's baseline. Prior to admission pt was residing with his wife in a 1 level home with ramped entrance, receiving HHPT services, required PRN assistance for bed mobility & transfers but ambulated with RW without physical assistance, but has hx of at least 2 falls/month.  On this date, pt required min assist for bed mobility with extra time & HOB fully elevated, min assist fade to CGA<>supervision for STS & pt was able to ambulate around small nursing pod with RW & CGA<>supervision. Pt demonstrates impaired gait pattern & writhing movements but pt's wife reports pt is close to baseline in regards to functional mobility. Anticipate pt can return home with HHPT services.    Recommendations for follow up therapy are one component of a multi-disciplinary discharge planning process, led by the attending physician.  Recommendations may be updated based on patient status, additional functional criteria and insurance authorization.  Follow Up Recommendations Home health PT      Assistance Recommended at Discharge Frequent or constant Supervision/Assistance  Patient can return home with the following  A little help with walking and/or transfers;Assistance with cooking/housework;A little help with bathing/dressing/bathroom;Assist for transportation;Help with stairs or ramp for entrance;Two people to help with bathing/dressing/bathroom;Direct supervision/assist for financial management    Equipment Recommendations None  recommended by PT  Recommendations for Other Services       Functional Status Assessment Patient has had a recent decline in their functional status and demonstrates the ability to make significant improvements in function in a reasonable and predictable amount of time.     Precautions / Restrictions Precautions Precautions: Fall Restrictions Weight Bearing Restrictions: No      Mobility  Bed Mobility Overal bed mobility: Needs Assistance Bed Mobility: Supine to Sit     Supine to sit: Min assist, HOB elevated     General bed mobility comments: assistance to move BLE to EOB with HOB fully elevated    Transfers Overall transfer level: Needs assistance Equipment used: Rolling walker (2 wheels), None Transfers: Sit to/from Stand Sit to Stand: Min assist, Supervision           General transfer comment: STS from EOB with min assist for initial STS, able to transfer STS from recliner without AD with close supervision    Ambulation/Gait Ambulation/Gait assistance: Min guard, Supervision Gait Distance (Feet): 100 Feet Assistive device: Rolling walker (2 wheels)         General Gait Details: Pt with intermittent dragging RLE, inconsistent step length & width BLE, pt's wife reports pt is at baseline with gait.  Stairs            Wheelchair Mobility    Modified Rankin (Stroke Patients Only)       Balance Overall balance assessment: Needs assistance, History of Falls Sitting-balance support: Feet supported, Bilateral upper extremity supported Sitting balance-Leahy Scale: Fair Sitting balance - Comments: supervision static sitting   Standing balance support: No upper extremity supported Standing balance-Leahy Scale: Fair Standing balance comment: static standing without BUE support with  CGA.                             Pertinent Vitals/Pain Pain Assessment Pain Assessment: Faces Faces Pain Scale: Hurts a little bit Pain Location: chronic L  shoulder pain Pain Descriptors / Indicators: Discomfort Pain Intervention(s): Monitored during session    Home Living Family/patient expects to be discharged to:: Private residence Living Arrangements: Spouse/significant other Available Help at Discharge: Family Type of Home: House Home Access: Ramped entrance       Home Layout: One level Home Equipment: Conservation officer, nature (2 wheels);Rollator (4 wheels)      Prior Function               Mobility Comments: Pt requires PRN assistance for bed mobility & STS, ambulates with RW without physical assistance. Wife reports hx of 2 falls/month.       Hand Dominance        Extremity/Trunk Assessment   Upper Extremity Assessment Upper Extremity Assessment: Generalized weakness    Lower Extremity Assessment Lower Extremity Assessment: Generalized weakness       Communication   Communication: Expressive difficulties (baseline speech deficits 2/2 parkinson's)  Cognition Arousal/Alertness: Awake/alert Behavior During Therapy: WFL for tasks assessed/performed Overall Cognitive Status: Impaired/Different from baseline Area of Impairment: Orientation, Attention, Memory, Following commands, Awareness, Safety/judgement, Problem solving                 Orientation Level: Disoriented to, Place, Time, Situation     Following Commands: Follows one step commands consistently, Follows one step commands with increased time Safety/Judgement: Decreased awareness of safety, Decreased awareness of deficits Awareness: Emergent   General Comments: Pt is oriented to self (name), unable to recall birthday or age during session. Follows simple commands throughout session.        General Comments      Exercises     Assessment/Plan    PT Assessment Patient needs continued PT services  PT Problem List Decreased strength;Decreased activity tolerance;Decreased cognition;Decreased balance;Decreased mobility;Decreased knowledge of  precautions;Decreased safety awareness;Decreased coordination       PT Treatment Interventions Therapeutic exercise;DME instruction;Gait training;Balance training;Stair training;Neuromuscular re-education;Functional mobility training;Therapeutic activities;Patient/family education    PT Goals (Current goals can be found in the Care Plan section)  Acute Rehab PT Goals Patient Stated Goal: get better PT Goal Formulation: With patient/family Time For Goal Achievement: 06/02/22 Potential to Achieve Goals: Good    Frequency Min 2X/week     Co-evaluation               AM-PAC PT "6 Clicks" Mobility  Outcome Measure Help needed turning from your back to your side while in a flat bed without using bedrails?: A Little Help needed moving from lying on your back to sitting on the side of a flat bed without using bedrails?: A Little Help needed moving to and from a bed to a chair (including a wheelchair)?: A Little Help needed standing up from a chair using your arms (e.g., wheelchair or bedside chair)?: A Little Help needed to walk in hospital room?: A Little Help needed climbing 3-5 steps with a railing? : A Lot 6 Click Score: 17    End of Session   Activity Tolerance: Patient tolerated treatment well Patient left: in chair;with chair alarm set;with call bell/phone within reach;with family/visitor present Nurse Communication: Mobility status PT Visit Diagnosis: Unsteadiness on feet (R26.81);History of falling (Z91.81);Muscle weakness (generalized) (M62.81);Other abnormalities of gait and mobility (R26.89)  Time: 3015-9968 PT Time Calculation (min) (ACUTE ONLY): 17 min   Charges:   PT Evaluation $PT Eval Moderate Complexity: [place], PT, DPT 05/19/22, 9:31 AM   Waunita Schooner 05/19/2022, 9:29 AM

## 2022-05-19 NOTE — Plan of Care (Signed)
Problem: Education: Goal: Ability to describe self-care measures that may prevent or decrease complications (Diabetes Survival Skills Education) will improve 05/19/2022 1838 by Simonne Martinet, RN Outcome: Progressing 05/19/2022 1838 by Simonne Martinet, RN Outcome: Progressing Goal: Individualized Educational Video(s) 05/19/2022 1838 by Simonne Martinet, RN Outcome: Progressing 05/19/2022 1838 by Simonne Martinet, RN Outcome: Progressing   Problem: Coping: Goal: Ability to adjust to condition or change in health will improve 05/19/2022 1838 by Simonne Martinet, RN Outcome: Progressing 05/19/2022 1838 by Simonne Martinet, RN Outcome: Progressing   Problem: Fluid Volume: Goal: Ability to maintain a balanced intake and output will improve 05/19/2022 1838 by Simonne Martinet, RN Outcome: Progressing 05/19/2022 1838 by Simonne Martinet, RN Outcome: Progressing   Problem: Health Behavior/Discharge Planning: Goal: Ability to identify and utilize available resources and services will improve 05/19/2022 1838 by Simonne Martinet, RN Outcome: Progressing 05/19/2022 1838 by Simonne Martinet, RN Outcome: Progressing Goal: Ability to manage health-related needs will improve 05/19/2022 1838 by Simonne Martinet, RN Outcome: Progressing 05/19/2022 1838 by Simonne Martinet, RN Outcome: Progressing   Problem: Metabolic: Goal: Ability to maintain appropriate glucose levels will improve 05/19/2022 1838 by Simonne Martinet, RN Outcome: Progressing 05/19/2022 1838 by Simonne Martinet, RN Outcome: Progressing   Problem: Nutritional: Goal: Maintenance of adequate nutrition will improve 05/19/2022 1838 by Simonne Martinet, RN Outcome: Progressing 05/19/2022 1838 by Simonne Martinet, RN Outcome: Progressing Goal: Progress toward achieving an optimal weight will improve 05/19/2022 1838 by Simonne Martinet, RN Outcome: Progressing 05/19/2022 1838 by Simonne Martinet, RN Outcome: Progressing   Problem: Skin Integrity: Goal: Risk for  impaired skin integrity will decrease 05/19/2022 1838 by Simonne Martinet, RN Outcome: Progressing 05/19/2022 1838 by Simonne Martinet, RN Outcome: Progressing   Problem: Tissue Perfusion: Goal: Adequacy of tissue perfusion will improve 05/19/2022 1838 by Simonne Martinet, RN Outcome: Progressing 05/19/2022 1838 by Simonne Martinet, RN Outcome: Progressing   Problem: Education: Goal: Knowledge of General Education information will improve Description: Including pain rating scale, medication(s)/side effects and non-pharmacologic comfort measures 05/19/2022 1838 by Simonne Martinet, RN Outcome: Progressing 05/19/2022 1838 by Simonne Martinet, RN Outcome: Progressing   Problem: Health Behavior/Discharge Planning: Goal: Ability to manage health-related needs will improve 05/19/2022 1838 by Simonne Martinet, RN Outcome: Progressing 05/19/2022 1838 by Simonne Martinet, RN Outcome: Progressing   Problem: Clinical Measurements: Goal: Ability to maintain clinical measurements within normal limits will improve 05/19/2022 1838 by Simonne Martinet, RN Outcome: Progressing 05/19/2022 1838 by Simonne Martinet, RN Outcome: Progressing Goal: Will remain free from infection 05/19/2022 1838 by Simonne Martinet, RN Outcome: Progressing 05/19/2022 1838 by Simonne Martinet, RN Outcome: Progressing Goal: Diagnostic test results will improve 05/19/2022 1838 by Simonne Martinet, RN Outcome: Progressing 05/19/2022 1838 by Simonne Martinet, RN Outcome: Progressing Goal: Respiratory complications will improve 05/19/2022 1838 by Simonne Martinet, RN Outcome: Progressing 05/19/2022 1838 by Simonne Martinet, RN Outcome: Progressing Goal: Cardiovascular complication will be avoided 05/19/2022 1838 by Simonne Martinet, RN Outcome: Progressing 05/19/2022 1838 by Simonne Martinet, RN Outcome: Progressing   Problem: Activity: Goal: Risk for activity intolerance will decrease 05/19/2022 1838 by Simonne Martinet, RN Outcome: Progressing 05/19/2022  1838 by Simonne Martinet, RN Outcome: Progressing   Problem: Nutrition: Goal: Adequate nutrition will be maintained 05/19/2022 1838 by Simonne Martinet, RN Outcome: Progressing 05/19/2022 1838 by Simonne Martinet, RN Outcome: Progressing   Problem: Coping: Goal: Level of anxiety will decrease 05/19/2022 1838 by Simonne Martinet, RN Outcome: Progressing 05/19/2022 1838 by Simonne Martinet, RN Outcome: Progressing   Problem: Elimination: Goal: Will not experience complications related to bowel motility 05/19/2022 1838  by Simonne Martinet, RN Outcome: Progressing 05/19/2022 1838 by Simonne Martinet, RN Outcome: Progressing Goal: Will not experience complications related to urinary retention Outcome: Progressing   Problem: Pain Managment: Goal: General experience of comfort will improve Outcome: Progressing   Problem: Safety: Goal: Ability to remain free from injury will improve Outcome: Progressing   Problem: Skin Integrity: Goal: Risk for impaired skin integrity will decrease Outcome: Progressing

## 2022-05-19 NOTE — TOC Initial Note (Addendum)
Transition of Care [facility]) - Initial/Assessment Note    Patient Details  Name: Randall [name] MRN: [id number] Date of Birth: 10/08/1950  Transition of Care [facility]) CM/SW Contact:    Valente David, RN Phone Number: 05/19/2022, 4:40 PM  Clinical Narrative:                  Spoke with patient and wife at bedside.  Patient admitted from home where he lives with wife.  She is aware of recommendations of HHPT/OT, state he was already active with [facility] prior to admission (receiving PT, OT, SLP, and aide).  PCP is Dr. Caryn Section, receives medications as mail order from [facility] mail or at the local [facility].  Has walker, shower chair, and BSC in the home.  Per MD order, will also need SW.  Call placed to [facility], message left for [facility].  TOC team will follow up to confirm they are able to resume services once patient is discharged.    Update 457pm: Spoke with Desiree at [facility], they are able to resume services once patient is discharged.  Aware that he will also need SW.  They will resume services within 48 hours of discharge.    Expected Discharge Plan: [place] Barriers to Discharge: Continued Medical Work up   Patient Goals and CMS Choice Patient states their goals for this hospitalization and ongoing recovery are:: Home with [facility] CMS Medicare.gov Compare Post Acute Care list provided to:: Patient Represenative (must comment) (Wife [name]) Choice offered to / list presented to : Spouse  Expected Discharge Plan and Services Expected Discharge Plan: [place] Acute Care Choice: [place] arrangements for the past 2 months: Single Family Home                                      Prior Living Arrangements/Services Living arrangements for the past 2 months: Single Family Home Lives with:: Spouse Patient language and need for interpreter reviewed:: Yes Do you feel safe going back to the place where you live?: Yes      Need for  Family Participation in Patient Care: Yes (Comment) Care giver support system in place?: Yes (comment) Current home services: Home OT, Home PT, Homehealth aide, Other (comment) (SLP) Criminal Activity/Legal Involvement Pertinent to Current Situation/Hospitalization: No - Comment as needed  Activities of Daily Living Home Assistive Devices/Equipment: Walker (specify type) ADL Screening (condition at time of admission) Patient's cognitive ability adequate to safely complete daily activities?: No Is the patient deaf or have difficulty hearing?: No Does the patient have difficulty seeing, even when wearing glasses/contacts?: No Does the patient have difficulty concentrating, remembering, or making decisions?: Yes Patient able to express need for assistance with ADLs?: Yes Does the patient have difficulty dressing or bathing?: Yes Independently performs ADLs?: No Communication: Independent Dressing (OT): Needs assistance Is this a change from baseline?: Pre-admission baseline Grooming: Needs assistance Is this a change from baseline?: Pre-admission baseline Feeding: Needs assistance Is this a change from baseline?: Pre-admission baseline Bathing: Needs assistance Is this a change from baseline?: Pre-admission baseline Toileting: Needs assistance Is this a change from baseline?: Pre-admission baseline In/Out Bed: Needs assistance Is this a change from baseline?: Pre-admission baseline Walks in Home: Needs assistance Is this a change from baseline?: Pre-admission baseline Does the patient have difficulty walking or climbing stairs?: Yes Weakness  of Legs: Both Weakness of Arms/Hands: Both  Permission Sought/Granted Permission sought to share information with : Case Manager Permission granted to share information with : Yes, Verbal Permission Granted  Share Information with NAME: Desiree  Permission granted to share info w AGENCY: [facility]        Emotional Assessment Appearance::  Appears stated age     Orientation: : Oriented to Self   Psych Involvement: No (comment)  Admission diagnosis:  Rhabdomyolysis [M62.82] Non-traumatic rhabdomyolysis [M62.82] Patient Active Problem List   Diagnosis Date Noted   Rhabdomyolysis 05/17/2022   Fall at home, initial encounter 05/17/2022   Leukocytosis 05/17/2022   Transaminitis 05/17/2022   Hyperbilirubinemia 05/17/2022   Adenoma of large intestine 02/01/2016   Clinical depression 02/01/2016   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   Benign essential HTN 02/01/2016   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/2016   Pure hypercholesterolemia 02/01/2016   Type 2 diabetes mellitus ([place]) 01/10/2016   Microalbuminuria 01/10/2016   Degeneration of intervertebral disc of lumbar region 07/13/2015   Parkinson's disease ([place]) 11/16/2012   PCP:  Adin Hector, MD Pharmacy:   [id number] [place] [place] [id number]: [phone]: [phone]    Social Determinants of Health (SDOH) Interventions Utilities Interventions: Intervention Not Indicated  Readmission Risk Interventions     No data to display

## 2022-05-20 DIAGNOSIS — E871 Hypo-osmolality and hyponatremia: Secondary | ICD-10-CM

## 2022-05-20 DIAGNOSIS — F05 Delirium due to known physiological condition: Secondary | ICD-10-CM

## 2022-05-20 DIAGNOSIS — T796XXA Traumatic ischemia of muscle, initial encounter: Secondary | ICD-10-CM | POA: Diagnosis not present

## 2022-05-20 LAB — BASIC METABOLIC PANEL
Anion gap: 11 (ref 5–15)
BUN: 11 mg/dL (ref 8–23)
CO2: 24 mmol/L (ref 22–32)
Calcium: 8.6 mg/dL — ABNORMAL LOW (ref 8.9–10.3)
Chloride: 90 mmol/L — ABNORMAL LOW (ref 98–111)
Creatinine, Ser: 0.71 mg/dL (ref 0.61–1.24)
GFR, Estimated: >60 mL/min (ref >60)
Glucose, Bld: 151 mg/dL — ABNORMAL HIGH (ref 70–99)
Potassium: 3.2 mmol/L — ABNORMAL LOW (ref 3.5–5.1)
Sodium: 125 mmol/L — ABNORMAL LOW (ref 135–145)

## 2022-05-20 LAB — MAGNESIUM: Magnesium: 1.7 mg/dL (ref 1.7–2.4)

## 2022-05-20 LAB — GLUCOSE, CAPILLARY
Glucose-Capillary: 131 mg/dL — ABNORMAL HIGH (ref 70–99)
Glucose-Capillary: 141 mg/dL — ABNORMAL HIGH (ref 70–99)
Glucose-Capillary: 132 mg/dL — ABNORMAL HIGH (ref 70–99)
Glucose-Capillary: 196 mg/dL — ABNORMAL HIGH (ref 70–99)

## 2022-05-20 LAB — CK: Total CK: 1812 U/L — ABNORMAL HIGH (ref 49–397)

## 2022-05-20 LAB — SODIUM
Sodium: 126 mmol/L — ABNORMAL LOW (ref 135–145)
Sodium: 126 mmol/L — ABNORMAL LOW (ref 135–145)

## 2022-05-20 MED ORDER — ENSURE MAX PROTEIN PO LIQD
11.0000 [oz_av] | Freq: Two times a day (BID) | ORAL | Status: DC
  Administered 2022-05-21 – 2022-05-29 (×15): 11 [oz_av] via ORAL
  Filled 2022-05-20: qty 330

## 2022-05-20 MED ORDER — ADULT MULTIVITAMIN W/MINERALS CH
1.0000 | ORAL_TABLET | Freq: Every day | ORAL | Status: DC
  Administered 2022-05-21 – 2022-06-19 (×28): 1 via ORAL
  Filled 2022-05-20 (×30): qty 1

## 2022-05-20 MED ORDER — POTASSIUM CHLORIDE 10 MEQ/100ML IV SOLN
10.0000 meq | INTRAVENOUS | Status: AC
  Administered 2022-05-20 (×2): 10 meq via INTRAVENOUS
  Filled 2022-05-20 (×2): qty 100

## 2022-05-20 MED ORDER — SENNOSIDES-DOCUSATE SODIUM 8.6-50 MG PO TABS
1.0000 | ORAL_TABLET | Freq: Two times a day (BID) | ORAL | Status: DC | PRN
Start: 2022-05-20 — End: 2022-06-19
  Administered 2022-05-22 – 2022-06-04 (×2): 1 via ORAL
  Filled 2022-05-20 (×3): qty 1

## 2022-05-20 MED ORDER — SODIUM CHLORIDE 1 G PO TABS
1.0000 g | ORAL_TABLET | Freq: Two times a day (BID) | ORAL | Status: DC
  Administered 2022-05-20 (×2): 1 g via ORAL
  Filled 2022-05-20 (×2): qty 1

## 2022-05-20 MED ORDER — ENSURE ENLIVE PO LIQD
237.0000 mL | Freq: Three times a day (TID) | ORAL | Status: DC
  Administered 2022-05-20: 237 mL via ORAL

## 2022-05-20 NOTE — Progress Notes (Signed)
   05/20/22 1735  Assess: MEWS Score  Temp 98.5 F (36.9 C)  BP (!) 204/81  MAP (mmHg) 114  Pulse Rate (!) 55  SpO2 100 %  O2 Device Room Air  Assess: MEWS Score  MEWS Temp 0  MEWS Systolic 2  MEWS Pulse 0  MEWS RR 0  MEWS LOC 0  MEWS Score 2  MEWS Score Color Yellow  Assess: if the MEWS score is Yellow or Red  Were vital signs taken at a resting state? Yes  Focused Assessment No change from prior assessment  Does the patient meet 2 or more of the SIRS criteria? No  MEWS guidelines implemented *See Row Information* Yes  Treat  Pain Scale 0-10  Pain Score 0  Take Vital Signs  Increase Vital Sign Frequency  Yellow: Q 2hr X 2 then Q 4hr X 2, if remains yellow, continue Q 4hrs  Escalate  MEWS: Escalate Yellow: discuss with charge nurse/RN and consider discussing with provider and RRT  Notify: Charge Nurse/RN  Name of Charge Nurse/RN Notified Montine Circle  Date Charge Nurse/RN Notified 05/20/22  Time Charge Nurse/RN Notified 1752  Document  Patient Outcome Other (Comment) (WIll continue to assess)  Progress note created (see row info) Yes  Assess: SIRS CRITERIA  SIRS Temperature  0  SIRS Pulse 0  SIRS Respirations  0  SIRS WBC 1  SIRS Score Sum  1

## 2022-05-20 NOTE — Progress Notes (Signed)
Initial Nutrition Assessment  DOCUMENTATION CODES:   Not applicable  INTERVENTION:   Ensure Max protein supplement BID, each supplement provides 150kcal and 30g of protein.  Magic cup TID with meals, each supplement provides 290 kcal and 9 grams of protein  MVI po daily   Chopped meats with meal trays  Pt at high refeed risk; recommend monitor potassium, magnesium and phosphorus labs daily until stable  NUTRITION DIAGNOSIS:   Inadequate oral intake related to acute illness as evidenced by per patient/family report.  GOAL:   Patient will meet greater than or equal to 90% of their needs  MONITOR:   PO intake, Supplement acceptance, Labs, Weight trends, Skin, I & O's  REASON FOR ASSESSMENT:   Malnutrition Screening Tool    ASSESSMENT:   71 y.o. male with medical history significant of hypertension, hyperlipidemia, OSA, depression, diabetes mellitus type 2 and Parkinson's disease with dementia who is admitted with traumatic rhabdomyolysis secondary to fall.  Met with pt and pt's wife in room today. Wife reports pt with decreased oral intake for several months pta. Wife reports that pt will usually eat well when she fixes something fresh but will not do well with eating left overs the next day. Wife reports that pt has been eating fairly well in hospital. Pt is documented to be eating 50-100% of meals. Wife reports that pt does not drink any supplements at home and she doesn't believe that he has ever tried any. RD discussed with pt and wife the importance of adequate nutrition needed to preserve lean muscle. Pt would like to try chocolate Ensure in hospital; RD will order. Pt requesting chopped foods if they are tough; will make a note in health touch. Per chart, pt appears weight stable at baseline.   Medications reviewed and include: lovenox, insulin, MVI, NaCl, NaCl _0 /hr  Labs reviewed: Na 126(L), K 3.2(L), Mg 1.7 Cbgs- 141, 131 x 24 hrs  AIC 5.5- 9/8   NUTRITION -  FOCUSED PHYSICAL EXAM:  Flowsheet Row Most Recent Value  Orbital Region No depletion  Upper Arm Region Mild depletion  Thoracic and Lumbar Region No depletion  Buccal Region No depletion  Temple Region Mild depletion  Clavicle Bone Region No depletion  Clavicle and Acromion Bone Region No depletion  Scapular Bone Region No depletion  Dorsal Hand Mild depletion  Patellar Region Severe depletion  Anterior Thigh Region Severe depletion  Posterior Calf Region Severe depletion  Edema (RD Assessment) None  Hair Reviewed  Eyes Reviewed  Mouth Reviewed  Skin Reviewed  Nails Reviewed   Diet Order:   Diet Order             Diet Carb Modified Fluid consistency: Thin; Room service appropriate? Yes; Fluid restriction: 1200 mL Fluid  Diet effective now                  EDUCATION NEEDS:   Education needs have been addressed  Skin:  Skin Assessment: Reviewed RN Assessment (ecchymosis)  Last BM:  9/7  Height:   Ht Readings from Last 1 Encounters:  05/17/22 5' 6" (1.676 m)    Weight:   Wt Readings from Last 1 Encounters:  05/17/22 67.1 kg    Ideal Body Weight:  59 kg  BMI:  Body mass index is 23.88 kg/m.  Estimated Nutritional Needs:   Kcal:  1800-2100kcal/day  Protein:  90-105g/day  Fluid:  1.7-2.9L/day  Koleen Distance MS, RD, LDN Please refer to [facility] for RD and/or RD on-call/weekend/after hours pager

## 2022-05-20 NOTE — Progress Notes (Addendum)
  Progress Note   Patient: Randall [name] FMB:[id number] DOB: 04/06/1951 DOA: 05/17/2022     3 DOS: the patient was seen and examined on 05/20/2022   Brief hospital course: Randall [name] is a 71 y.o. male with medical history significant of hypertension, hyperlipidemia, diabetes mellitus type 2, Parkinson's disease with dementia presents after having a fall at home.  Patient was on the floor for about 12 hours.  By time he arrived in the hospital, he was found to have significant rhabdomyolysis with CK level of 8278.  Renal function still normal.  He was placed on IV fluids.   Assessment and Plan: Traumatic rhabdomyolysis secondary to fall. Fall at home due to Parkinson disease. Parkinson disease. CK level still elevated.  Continue IV fluids.  Recheck level tomorrow. Continue Sinemet at home dose.  Patient is also on Requip.  Hyponatremia. Sodium dropped down to 125 this morning, repeat 126.  Patient has a component of SIADH. However, patient still needs normal saline fluids for rhabdomyolysis. I will start fluid restriction, also add salt tablets 1 g twice a day.  Repeat BMP tomorrow.   Delirium. Patient had sundowning yesterday, received Haldol.  He is also taking Seroquel every evening as scheduled.  Continue to follow.   Type 2 diabetes. Recent A1c 6.2.  Sliding scale insulin.      Subjective:  Patient had a sundowning yesterday, confused and agitated.  Slept better last night. Patient denies any nausea vomiting, appetite is good. Denies any short of breath or cough.  Physical Exam: Vitals:   05/19/22 1601 05/19/22 2035 05/20/22 0400 05/20/22 0848  BP: 133/72 (!) 154/88 (!) 149/85 (!) 153/77  Pulse: (!) 56 72 68 61  Resp: '18 18 17   '$ Temp: 98 F (36.7 C) 98.8 F (37.1 C) 98.9 F (37.2 C) 97.6 F (36.4 C)  TempSrc:   Axillary   SpO2: 97% 93% 96% 100%  Weight:      Height:       General exam: Appears calm and comfortable  Respiratory system: Clear to auscultation.  Respiratory effort normal. Cardiovascular system: S1 & S2 heard, RRR. No JVD, murmurs, rubs, gallops or clicks. No pedal edema. Gastrointestinal system: Abdomen is nondistended, soft and nontender. No organomegaly or masses felt. Normal bowel sounds heard. Central nervous system: Alert and oriented x2. No focal neurological deficits. Extremities: Tremor Skin: No rashes, lesions or ulcers Psychiatry: Judgement and insight appear normal. Mood & affect appropriate.   Data Reviewed:  Lab results reviewed.  Family Communication: Wife updated at bedside  Disposition: Status is: Inpatient Remains inpatient appropriate because: Severity of disease, IV fluids.  Planned Discharge Destination: Home with Home Health    Time spent: 35 minutes  Author: Sharen Hones, MD 05/20/2022 10:09 AM  For on call review [email or web address].

## 2022-05-20 NOTE — Plan of Care (Signed)

## 2022-05-20 NOTE — Care Management Important Message (Signed)
Important Message  Patient Details  Name: Randall [name] MRN: [id number] Date of Birth: 1951/07/05   Medicare Important Message Given:  Yes     Loann Quill 05/20/2022, 3:27 PM

## 2022-05-21 DIAGNOSIS — R1313 Dysphagia, pharyngeal phase: Secondary | ICD-10-CM

## 2022-05-21 DIAGNOSIS — T796XXA Traumatic ischemia of muscle, initial encounter: Secondary | ICD-10-CM | POA: Diagnosis not present

## 2022-05-21 DIAGNOSIS — R1310 Dysphagia, unspecified: Secondary | ICD-10-CM

## 2022-05-21 DIAGNOSIS — F05 Delirium due to known physiological condition: Secondary | ICD-10-CM | POA: Diagnosis not present

## 2022-05-21 LAB — BASIC METABOLIC PANEL
Anion gap: 10 (ref 5–15)
BUN: 11 mg/dL (ref 8–23)
CO2: 22 mmol/L (ref 22–32)
Calcium: 8.6 mg/dL — ABNORMAL LOW (ref 8.9–10.3)
Chloride: 95 mmol/L — ABNORMAL LOW (ref 98–111)
Creatinine, Ser: 0.64 mg/dL (ref 0.61–1.24)
GFR, Estimated: >60 mL/min (ref >60)
Glucose, Bld: 128 mg/dL — ABNORMAL HIGH (ref 70–99)
Potassium: 3.5 mmol/L (ref 3.5–5.1)
Sodium: 127 mmol/L — ABNORMAL LOW (ref 135–145)

## 2022-05-21 LAB — SODIUM: Sodium: 128 mmol/L — ABNORMAL LOW (ref 135–145)

## 2022-05-21 LAB — GLUCOSE, CAPILLARY
Glucose-Capillary: 156 mg/dL — ABNORMAL HIGH (ref 70–99)
Glucose-Capillary: 186 mg/dL — ABNORMAL HIGH (ref 70–99)
Glucose-Capillary: 235 mg/dL — ABNORMAL HIGH (ref 70–99)
Glucose-Capillary: 217 mg/dL — ABNORMAL HIGH (ref 70–99)

## 2022-05-21 LAB — CK: Total CK: 397 U/L (ref 49–397)

## 2022-05-21 LAB — MAGNESIUM: Magnesium: 1.7 mg/dL (ref 1.7–2.4)

## 2022-05-21 MED ORDER — HALOPERIDOL LACTATE 5 MG/ML IJ SOLN: 1.0000 mg | Freq: Four times a day (QID) | INTRAMUSCULAR | Status: DC | PRN

## 2022-05-21 MED ORDER — HALOPERIDOL LACTATE 5 MG/ML IJ SOLN: 2.0000 mg | Freq: Four times a day (QID) | INTRAMUSCULAR | Status: DC | PRN

## 2022-05-21 MED ORDER — HALOPERIDOL 0.5 MG PO TABS: 2.0000 mg | ORAL_TABLET | Freq: Four times a day (QID) | ORAL | Status: DC | PRN

## 2022-05-21 MED ORDER — HALOPERIDOL 0.5 MG PO TABS: 2.0000 mg | ORAL_TABLET | ORAL | Status: DC | PRN

## 2022-05-21 MED ORDER — SODIUM CHLORIDE 1 G PO TABS
1.0000 g | ORAL_TABLET | Freq: Three times a day (TID) | ORAL | Status: DC
  Administered 2022-05-21 – 2022-05-24 (×10): 1 g via ORAL
  Filled 2022-05-21 (×10): qty 1

## 2022-05-21 MED ORDER — HALOPERIDOL LACTATE 5 MG/ML IJ SOLN
2.0000 mg | INTRAMUSCULAR | Status: DC | PRN
  Administered 2022-05-21: 2 mg via INTRAMUSCULAR
  Filled 2022-05-21: qty 1

## 2022-05-21 NOTE — Evaluation (Addendum)
Clinical/Bedside Swallow Evaluation Patient Details  Name: Randall [name] MRN: [id number] Date of Birth: 05-23-1951  Today's Date: 05/21/2022 Time: SLP Start Time (ACUTE ONLY): 0840 SLP Stop Time (ACUTE ONLY): 0935 SLP Time Calculation (min) (ACUTE ONLY): 55 min  Past Medical History:  Past Medical History:  Diagnosis Date   Actinic keratosis    Allergic    Colon adenoma    Depression    Diabetes mellitus, type II ([place])    Diverticulosis    Hypertension    Parkinson disease ([place])    Seizures ([place])    Sleep apnea    Urinary hesitancy    Past Surgical History:  Past Surgical History:  Procedure Laterality Date   COLON SURGERY     HERNIA REPAIR     UVULOPALATOPHARYNGOPLASTY, TONSILLECTOMY AND SEPTOPLASTY     HPI:  Pt is a 71 y.o. male with medical history significant of hypertension, hyperlipidemia, diabetes mellitus type 2, Parkinson's disease with dementia and dysphagia who presents after having a fall at home.  The patient does not recall all the exact details and additional history is obtained from his wife who is present.  He had gotten out of bed around 3:20 AM this morning and was using his walker when he had fallen.  His wife notes that the night light that is usually on in the room was off at the time.  There was a cord that he may have tripped over possibly.  Patient reports that he did hit his head on the floor.  Unclear if he had any loss of consciousness.  His wife tried to assist him in getting up, but was unable.  Patient stated that he did not want her to call EMS.  He continued to try and get up for several hours until he finally allowed his wife to call EMS around 3 PM this afternoon.   Pt has been seen for 2 MBSS in 2021, 2020 revealing "presenting with moderate oropharyngeal dysphagia characterized by mildly disorganized/slowed oral management, delayed pharyngeal swallow initiation, reduced hyolaryngeal excursion, incomplete epiglottic inversion, reduced tongue base  retraction, moderate-severe pharyngeal residue with thick consistencies, mild pharyngeal residue with thin liquid, transient laryngeal penetration, and frank aspiration X1 with nectar-thick liquids.  The patient was observed to work harder with thin liquid, generating more hyolaryngeal excursion with no observed laryngeal penetration / tracheal aspiration and less pharyngeal residue.".  Pt is at risk for aspiration and aspiration pnuemonia.   Current CXR: No acute abnormality.    Assessment / Plan / Recommendation  Clinical Impression   Pt seen for BSE this morning. Wife present. Pt resting in bed; awake and verbal -- MOD+ Dysarthria present (baseline). Pt has been seen for 2 MBSS in 2021, 2020 revealing "presenting with moderate oropharyngeal dysphagia characterized by mildly disorganized/slowed oral management, delayed pharyngeal swallow initiation, reduced hyolaryngeal excursion, incomplete epiglottic inversion, reduced tongue base retraction, moderate-severe pharyngeal residue with thick consistencies, mild pharyngeal residue with thin liquid, transient laryngeal penetration, and frank aspiration X1 with nectar-thick liquids. The patient was observed to work harder with thin liquid, generating more hyolaryngeal excursion with no observed laryngeal penetration / tracheal aspiration and less pharyngeal residue.".  Pt is at risk for aspiration and aspiration pneumonia per results above.  Current CXR: No acute abnormality. On RA; afebrile and WBC WNL. Pt requried haldol on 05/19/21 2/2 agitation/sundowning.  Wife reported pt has coughing "sometimes" w/ meals. NSG also reported difficulty swallowing Pills w/ water last evening.   Pt appears to present w/  oropharyngeal phase dysphagia w/ suspected Neuromuscular impact from Baseline Parkinson's Disease w/ Cognitive decline. ANY neuromuscular weakness and/or decreased awareness during po intake can increase risk for aspiration, aspiration pneumonia. Pt has  Baseline oropharyngeal phase Dysphagia per previous objective swallow studies.   Pt consumed trials of thin liquids via straw(baseline use), purees, and soft solids feeding himself w/ Mod setup support of tray items. Inconsistent, delayed (overt) clinical s/s of aspiration noted w/ trials of thin liquids (delayed throat clearing) -- suspect similar was noted by NSG w/ Pills and Water. Educated pt on aspiration precautions including small, single sips as he continued w/ breakfast meal. No overt coughing followed. Respiratory presentation remained unchanged, calm. Vocal quality grossly clear -- wet quality noted x1 b/t trials (this was also noted PRIOR TO any po's x1). Suspect potential impact of pharyngeal residue remaining.  Oral phase was c/b min slow, deliberate bolus management and oral clearing of all boluses given; slight-min bolus residue remained in lateral buccal areas of mouth. He required increased Time for A-P transfer w/ increased textured foods -- Time b/t trials to fully clear solid foods also using strategies of lingual sweeping and alternating b/t foods/drink to aid oral clearing.  OM exam revealed generalized lingual weakness w/ retracted lingual position at rest. Symmetry WFL; Labial closure [facility] w/ no anterior bolus loss. Increased effort given to lingual sweeping.   Recommend a more dysphagia level 3 diet(cut meats/foods for ease of mastication and oral phase clearing) w/ Thin liquids; aspiration precautions; Pills WHOLE in Puree for safer swallowing; Tray setup and Positioning support at all meals. Less straw use if coughing noted and intermittent throat clearing during/at end of meals. Reduce Distractions/Talking during meals. NSG/MD updated. Recommend f/u Outpatient objective swallow study if pt desires to assess swallow function since last MBSS. Recommend f/u w/ Palliative Care Outpatient for [place] discussion in setting of progressive disease process(Parkinson's Dis w/ Dementia).   SLP  Visit Diagnosis: Dysphagia, oropharyngeal phase (R13.12) (baseline for pt in setting of Parkinson's Dis.)    Aspiration Risk  Mild aspiration risk;Risk for inadequate nutrition/hydration    Diet Recommendation   dysphagia level 3 diet(cut meats/foods for ease of mastication and oral phase clearing) w/ Thin liquids; aspiration precautions; Tray setup and Positioning support at all meals. Less straw use if coughing noted and intermittent throat clearing during/at end of meals. Reduce Distractions/Talking during meals.   Medication Administration: Whole meds with puree (for safer swallowing)    Other  Recommendations Recommended Consults:  (Dietician support) Oral Care Recommendations: Oral care BID;Oral care before and after PO;Patient independent with oral care (setup support) Other Recommendations:  (n/a currently)    Recommendations for follow up therapy are one component of a multi-disciplinary discharge planning process, led by the attending physician.  Recommendations may be updated based on patient status, additional functional criteria and insurance authorization.  Follow up Recommendations Follow physician's recommendations for discharge plan and follow up therapies      Assistance Recommended at Discharge Intermittent Supervision/Assistance (positioning, feeding)  Functional Status Assessment Patient has had a recent decline in their functional status and/or demonstrates limited ability to make significant improvements in function in a reasonable and predictable amount of time  Frequency and Duration  (n/a)   (n/a)       Prognosis Prognosis for Safe Diet Advancement: Fair Barriers to Reach Goals: Cognitive deficits;Time post onset;Severity of deficits Barriers/Prognosis Comment: baseline Parkinson's Dis.; dysarthria; deconditioned      Swallow Study   General Date of Onset: 05/17/22  HPI: Pt is a 71 y.o. male with medical history significant of hypertension, hyperlipidemia,  diabetes mellitus type 2, Parkinson's disease with dementia and dysphagia who presents after having a fall at home.  The patient does not recall all the exact details and additional history is obtained from his wife who is present.  He had gotten out of bed around 3:20 AM this morning and was using his walker when he had fallen.  His wife notes that the night light that is usually on in the room was off at the time.  There was a cord that he may have tripped over possibly.  Patient reports that he did hit his head on the floor.  Unclear if he had any loss of consciousness.  His wife tried to assist him in getting up, but was unable.  Patient stated that he did not want her to call EMS.  He continued to try and get up for several hours until he finally allowed his wife to call EMS around 3 PM this afternoon.  Pt has been seen for 2 MBSS in 2021, 2020 revealing "presenting with moderate oropharyngeal dysphagia characterized by mildly disorganized/slowed oral management, delayed pharyngeal swallow initiation, reduced hyolaryngeal excursion, incomplete epiglottic inversion, reduced tongue base retraction, moderate-severe pharyngeal residue with thick consistencies, mild pharyngeal residue with thin liquid, transient laryngeal penetration, and frank aspiration X1 with nectar-thick liquids.  The patient was observed to work harder with thin liquid, generating more hyolaryngeal excursion with no observed laryngeal penetration / tracheal aspiration and less pharyngeal residue.".  Pt is at risk for aspiration and aspiration pnuemonia.   Current CXR: No acute abnormality. Type of Study: Bedside Swallow Evaluation Previous Swallow Assessment: MBSSs in 2021, 2020 Diet Prior to this Study: Regular;Thin liquids (Wife cuts foods for him) Temperature Spikes Noted: No (wbc 8.2) Respiratory Status: Room air History of Recent Intubation: No Behavior/Cognition: Alert;Cooperative;Pleasant mood;Distractible;Requires cueing Oral  Cavity Assessment: Within Functional Limits Oral Care Completed by SLP: Yes Oral Cavity - Dentition: Adequate natural dentition Vision: Functional for self-feeding Self-Feeding Abilities: Able to feed self;Needs assist;Needs set up Patient Positioning: Upright in bed (needed positioning support for head forward positioning) Baseline Vocal Quality: Low vocal intensity (Parkinson's Dis.) Volitional Cough:  (Fair-strong) Volitional Swallow: Able to elicit (w/ time)    Oral/Motor/Sensory Function Overall Oral Motor/Sensory Function: Generalized oral weakness Facial Symmetry: Within Functional Limits Lingual ROM: Within Functional Limits Lingual Symmetry: Within Functional Limits Lingual Strength: Reduced (tongue back position)   Ice Chips Ice chips: Not tested   Thin Liquid Thin Liquid: Impaired (inconsistent throat clearing post trials) Presentation: Self Fed;Straw Oral Phase Impairments:  (none) Pharyngeal  Phase Impairments: Throat Clearing - Delayed (inconsistent) Other Comments: Wife stated "he coughs at home sometimes"    Nectar Thick Nectar Thick Liquid: Not tested   Honey Thick Honey Thick Liquid: Not tested   Puree Puree: Within functional limits (grossly) Presentation: Self Fed;Spoon (~4 ozs total; supported)   Solid     Solid: Impaired Presentation: Spoon;Self Fed (5-6 trials) Oral Phase Impairments: Impaired mastication;Reduced lingual movement/coordination (reduced rotary chewing) Oral Phase Functional Implications: Impaired mastication Pharyngeal Phase Impairments:  (none)         Orinda Kenner, MS, CCC-SLP Speech Language Pathologist Rehab Services; [place] [phone] (ascom) [place] 05/21/2022,2:45 PM

## 2022-05-21 NOTE — Progress Notes (Signed)
Occupational Therapy Treatment Patient Details Name: Randall [name] MRN: [id number] DOB: 06-04-1951 Today's Date: 05/21/2022   History of present illness PT is a 71 year old male admitted after being found down on floor for 12 hours, admitted with significant rhabdomyolysis; PMH significant for  hypertension, hyperlipidemia, diabetes mellitus type 2, Parkinson's disease with dementia. Pt requried haldol on 05/19/21 2/2 agitation/sundowning.   OT comments  Randall [name] was seen for OT treatment on this date. Upon arrival to room pt awake/alert, mildly agitated and attempting to get OOB to "go to the bathroom". Wife at bedside states she is uncomfortable assisting pt as she is unable to stand independently 2/2 to her recent knee injury. OT facilitated safe toilet transfer and toileting during session. Pt requires MOD A for functional mobility and MOD A for standing peri-care 2/2 poor balance and decreased safety awareness. Pt eager to continue to move once OOB and performs functional mobility in hallway with variable assist; at times requiring MOD A to maintain safety and upright positioning. Pt requires MAX multimodal cueing for safe use of RW this date. He regularly attempts to lift walker up off of floor putting him at increased risk of falling/further injury. Pt noted with limited recall of education provided 2/2 baseline cognitive deficits. He does follow VCs consistently with increased time/curing this session. Pt has demonstrated a decline in functional independence and safety since time of initial OT evaluation. He is progressing toward goals, however, would benefit from short term rehab placement upon acute hospital DC to maximize return to PLOF and minimize risk of future falls, injury, caregiver burden, and readmission. Will continue to follow POC.    Recommendations for follow up therapy are one component of a multi-disciplinary discharge planning process, led by the attending physician.   Recommendations may be updated based on patient status, additional functional criteria and insurance authorization.    Follow Up Recommendations  Skilled nursing-short term rehab (<3 hours/day)    Assistance Recommended at Discharge Frequent or constant Supervision/Assistance  Patient can return home with the following  A little help with walking and/or transfers;A little help with bathing/dressing/bathroom;Assistance with cooking/housework;Direct supervision/assist for medications management   Equipment Recommendations  None recommended by OT (Pt has necessary equipment)    Recommendations for Other Services      Precautions / Restrictions Precautions Precautions: Fall Restrictions Weight Bearing Restrictions: No       Mobility Bed Mobility Overal bed mobility: Needs Assistance Bed Mobility: Supine to Sit     Supine to sit: HOB elevated, Min assist     General bed mobility comments: Significant increased effort to perform.    Transfers Overall transfer level: Needs assistance Equipment used: Rolling walker (2 wheels) Transfers: Sit to/from Stand Sit to Stand: Min assist           General transfer comment: From bed in lowest position.     Balance Overall balance assessment: Needs assistance, History of Falls Sitting-balance support: Feet supported, Single extremity supported Sitting balance-Leahy Scale: Fair Sitting balance - Comments: supervision in static sitting   Standing balance support: Bilateral upper extremity supported, Reliant on assistive device for balance, During functional activity Standing balance-Leahy Scale: Poor                             ADL either performed or assessed with clinical judgement   ADL Overall ADL's : Needs assistance/impaired  Upper Body Dressing : Cueing for sequencing;Minimal assistance;Sitting       Toilet Transfer: Moderate assistance;Minimal assistance;Rolling walker (2  wheels);Regular Glass blower/designer Details (indicate cue type and reason): Pt requires variable assistance during toilet transfer using RW. He is notably unstable with directional changes and requires consistent cueing for safe use of RW during session. x2 instances of posterior LOB which pt requires MOD A to maintain safe standing balance while ambulating to commode. Toileting- Clothing Manipulation and Hygiene: Moderate assistance;Sit to/from stand Toileting - Clothing Manipulation Details (indicate cue type and reason): MOD A for peri-care after BM. MAX cueing for safety/sequencing/thoroughness.     Functional mobility during ADLs: Moderate assistance;Rolling walker (2 wheels);Minimal assistance;Min guard General ADL Comments: Variable assist required during functional mobility around nsg station. Pt noted to move impulsively with poor safety awareness.    Extremity/Trunk Assessment Upper Extremity Assessment Upper Extremity Assessment: Generalized weakness   Lower Extremity Assessment Lower Extremity Assessment: Generalized weakness   Cervical / Trunk Assessment Cervical / Trunk Assessment: Kyphotic    Vision Baseline Vision/History: 1 Wears glasses Patient Visual Report: No change from baseline     Perception     Praxis      Cognition Arousal/Alertness: Awake/alert Behavior During Therapy: WFL for tasks assessed/performed Overall Cognitive Status: History of cognitive impairments - at baseline                               Problem Solving: Requires verbal cues, Requires tactile cues, Difficulty sequencing General Comments: Requires increased cueing to follow 1 step VCs during session.        Exercises Other Exercises Other Exercises: edu pt and wife re: role of OT, role of rehab, discharge recommendations, home safety, home set up, DME use, falls prevention. OT facilitated toilet transfer, toileting, UB dressing, and functional mobility during  session.    Shoulder Instructions       General Comments      Pertinent Vitals/ Pain       Pain Assessment Pain Assessment: No/denies pain  Home Living                                          Prior Functioning/Environment              Frequency  Min 2X/week        Progress Toward Goals  OT Goals(current goals can now be found in the care plan section)  Progress towards OT goals: Progressing toward goals  Acute Rehab OT Goals Patient Stated Goal: to feel better OT Goal Formulation: With patient/family Time For Goal Achievement: 06/02/22 Potential to Achieve Goals: Good  Plan Frequency remains appropriate;Discharge plan needs to be updated    Co-evaluation                 AM-PAC OT "6 Clicks" Daily Activity     Outcome Measure   Help from another person eating meals?: A Little Help from another person taking care of personal grooming?: A Little Help from another person toileting, which includes using toliet, bedpan, or urinal?: A Lot Help from another person bathing (including washing, rinsing, drying)?: A Lot Help from another person to put on and taking off regular upper body clothing?: A Little Help from another person to put on and taking off regular lower body clothing?: A Little  6 Click Score: 16    End of Session Equipment Utilized During Treatment: Gait belt;Rolling walker (2 wheels)  OT Visit Diagnosis: Unsteadiness on feet (R26.81);Other abnormalities of gait and mobility (R26.89);History of falling (Z91.81)   Activity Tolerance Patient tolerated treatment well   Patient Left in chair;with call bell/phone within reach;with chair alarm set;with family/visitor present   Nurse Communication Mobility status        Time: 2800-3491 OT Time Calculation (min): 28 min  Charges: OT General Charges $OT Visit: 1 Visit OT Treatments $Self Care/Home Management : 23-37 mins  Shara Blazing, M.S., OTR/L Ascom:  [phone] 05/21/22, 12:57 PM

## 2022-05-21 NOTE — Progress Notes (Signed)
Physical Therapy Treatment Patient Details Name: Randall [name] MRN: [id number] DOB: 02-Mar-1951 Today's Date: 05/21/2022   History of Present Illness Pt is a 71 year old male admitted after being found down on floor for 12 hours, admitted with significant rhabdomyolysis; PMH significant for  hypertension, hyperlipidemia, diabetes mellitus type 2, Parkinson's disease with dementia. Pt requried haldol on 05/19/21 2/2 agitation/sundowning.    PT Comments    Patient alert, garbled speech making it difficult to understand the pt. Family endorsed he still seems to be a bit confused from baseline, followed all one step commands, needed intermittent repetition. Supine to sit with supervision from [facility] elevated, extended time needed. Sit <> stand with RW and minA due to posterior LOB and to finish lifting into standing. He ambulated >72f, but needed constant CGA-MinA with RW due to 2-3 LOB with mobility. Returned to bed after bed changed, noted to be soaked with urine. Pt recommendation updated to SNF at this time due to decrease in functional mobility and to maximize safety.     Recommendations for follow up therapy are one component of a multi-disciplinary discharge planning process, led by the attending physician.  Recommendations may be updated based on patient status, additional functional criteria and insurance authorization.  Follow Up Recommendations  Skilled nursing-short term rehab (<3 hours/day) Can patient physically be transported by private vehicle: Yes   Assistance Recommended at Discharge Frequent or constant Supervision/Assistance  Patient can return home with the following A little help with walking and/or transfers;Assistance with cooking/housework;A little help with bathing/dressing/bathroom;Assist for transportation;Help with stairs or ramp for entrance;Two people to help with bathing/dressing/bathroom;Direct supervision/assist for financial management   Equipment Recommendations  None  recommended by PT    Recommendations for Other Services       Precautions / Restrictions Precautions Precautions: Fall Restrictions Weight Bearing Restrictions: No     Mobility  Bed Mobility Overal bed mobility: Needs Assistance Bed Mobility: Supine to Sit     Supine to sit: Min guard, HOB elevated     General bed mobility comments: Significant increased effort to perform.    Transfers Overall transfer level: Needs assistance Equipment used: Rolling walker (2 wheels) Transfers: Sit to/from Stand Sit to Stand: Min assist           General transfer comment: posterior LOB noted, needed physical assist to come up into standing    Ambulation/Gait Ambulation/Gait assistance: Min assist   Assistive device: Rolling walker (2 wheels)         General Gait Details: pt with staggering, difficulty with RW management, 2-3 LOB noted with minA to correct.   Stairs             Wheelchair Mobility    Modified Rankin (Stroke Patients Only)       Balance Overall balance assessment: Needs assistance, History of Falls Sitting-balance support: Feet supported, Single extremity supported Sitting balance-Leahy Scale: Fair Sitting balance - Comments: supervision in static sitting   Standing balance support: Bilateral upper extremity supported, Reliant on assistive device for balance, During functional activity Standing balance-Leahy Scale: Poor                              Cognition Arousal/Alertness: Awake/alert Behavior During Therapy: WFL for tasks assessed/performed Overall Cognitive Status: History of cognitive impairments - at baseline  General Comments: garbled speech, does follow commands with repetition as needed        Exercises      General Comments        Pertinent Vitals/Pain Pain Assessment Pain Assessment: No/denies pain    Home Living                          Prior  Function            PT Goals (current goals can now be found in the care plan section) Progress towards PT goals: Progressing toward goals    Frequency    Min 2X/week      PT Plan Discharge plan needs to be updated    Co-evaluation              AM-PAC PT "6 Clicks" Mobility   Outcome Measure  Help needed turning from your back to your side while in a flat bed without using bedrails?: A Little Help needed moving from lying on your back to sitting on the side of a flat bed without using bedrails?: A Little Help needed moving to and from a bed to a chair (including a wheelchair)?: A Little Help needed standing up from a chair using your arms (e.g., wheelchair or bedside chair)?: A Little Help needed to walk in hospital room?: A Lot Help needed climbing 3-5 steps with a railing? : A Lot 6 Click Score: 16    End of Session Equipment Utilized During Treatment: Gait belt Activity Tolerance: Patient tolerated treatment well Patient left: in chair;with chair alarm set;with call bell/phone within reach;with family/visitor present Nurse Communication: Mobility status PT Visit Diagnosis: Unsteadiness on feet (R26.81);History of falling (Z91.81);Muscle weakness (generalized) (M62.81);Other abnormalities of gait and mobility (R26.89)     Time: 8937-3428 PT Time Calculation (min) (ACUTE ONLY): 23 min  Charges:  $Therapeutic Activity: 23-37 mins                    Lieutenant Diego PT, DPT 3:53 PM,05/21/22

## 2022-05-21 NOTE — Progress Notes (Addendum)
Mobility Specialist - Progress Note   05/21/22 1651  Mobility  Activity Ambulated with assistance in hallway  Level of Assistance Minimal assist, patient does 75% or more  Assistive Device Front wheel walker  Distance Ambulated (ft) 160 ft  Activity Response Tolerated well  $Mobility charge 1 Mobility     Pt sitting EOB on arrival, utilizing RA. Pt provided with new gown prior to continuation of activity. VC for hands placement and sequencing steps. Pt ambulated in hallway with minA---staggering steps but no LOB with gait. No complaints. Pt returned to bed with alarm set, needs in reach.    Kathee Delton Mobility Specialist 05/21/22, 4:52 PM

## 2022-05-21 NOTE — Plan of Care (Signed)
  Problem: Coping: Goal: Ability to adjust to condition or change in health will improve Outcome: Progressing   Problem: Coping: Goal: Level of anxiety will decrease Outcome: Progressing   Problem: Pain Managment: Goal: General experience of comfort will improve Outcome: Progressing   Problem: Safety: Goal: Ability to remain free from injury will improve Outcome: Progressing

## 2022-05-21 NOTE — TOC Progression Note (Signed)
Transition of Care [facility]) - Progression Note    Patient Details  Name: Randall [name] MRN: [id number] Date of Birth: Sep 07, 1951  [facility] CM/SW Contact  Laurena Slimmer, RN Phone Number: 05/21/2022, 1:00 PM  Clinical Narrative:    Spoke with patient's spouse regarding therapy recommendation and discharge. Fmaily agreeable to SNF. They would prefer [facility].    Expected Discharge Plan: [place] Barriers to Discharge: Continued Medical Work up  Expected Discharge Plan and Services Expected Discharge Plan: [place] Choice: [place] arrangements for the past 2 months: Single Family Home                                       Social Determinants of Health (SDOH) Interventions Utilities Interventions: Intervention Not Indicated  Readmission Risk Interventions     No data to display

## 2022-05-21 NOTE — Progress Notes (Signed)
  Progress Note   Patient: Randall [name] GGE:[id number] DOB: [birth year] DOA: 05/17/2022     4 DOS: the patient was seen and examined on 05/21/2022   Brief hospital course: Randall [name] is a 71 y.o. male with medical history significant of hypertension, hyperlipidemia, diabetes mellitus type 2, Parkinson's disease with dementia presents after having a fall at home.  Patient was on the floor for about 12 hours.  By time he arrived in the hospital, he was found to have significant rhabdomyolysis with CK level of 8278.  Renal function still normal.  He was placed on IV fluids. Patient CK level was normalized on 9/12.  However, patient developed some confusion as well as dysphagia.  PT/OT recommended nursing home placement.  Also obtain palliative care.  Assessment and Plan: Traumatic rhabdomyolysis secondary to fall. Fall at home due to Parkinson disease. Parkinson disease. Dysphagia secondary to Parkinson disease. Increased weakness due to rhabdomyolysis. Patient CK level has normalized today after IV fluids.  However, patient developed intermittent confusion with sundowning.  Currently treated with Seroquel at home dose.  Patient slept at night. Patient is seen by PT/OT, now recommended nursing home placement. Also seen by speech therapist, medication placing pure. Due to confusion and dysphagia, will obtain palliative care consult.   Hyponatremia. Sodium dropped down to 125 after normal saline infusion.  Patient has a component of SIADH. He was started on salt tablets 1 g twice a day.  Today's sodium was 128, discontinued normal saline infusion.  Continue salt tablets 1 g 3 times a day.  Recheck BMP tomorrow.     Delirium. Patient has sundowning for the last 2 days.  Continue Seroquel at nighttime.  Continue to follow.   Type 2 diabetes. Recent A1c 6.2.  Sliding scale insulin.         Subjective:  Patient has some confusion at the late afternoon.  He seems to sleep well at nighttime.   He has increased weakness. No shortness of breath or cough.  Physical Exam: Vitals:   05/21/22 0631 05/21/22 0755 05/21/22 1141 05/21/22 1513  BP:  (!) 144/76 128/61 135/66  Pulse:  66 68 80  Resp:  '18 18 16  '$ Temp:  98.7 F (37.1 C) 98.6 F (37 C) 98.1 F (36.7 C)  TempSrc:  Oral Oral   SpO2:   98% 99%  Weight: 76.7 kg     Height:       General exam: Appears calm and comfortable  Respiratory system: Clear to auscultation. Respiratory effort normal. Cardiovascular system: S1 & S2 heard, RRR. No JVD, murmurs, rubs, gallops or clicks. No pedal edema. Gastrointestinal system: Abdomen is nondistended, soft and nontender. No organomegaly or masses felt. Normal bowel sounds heard. Central nervous system: Alert and confused. Tremor Extremities: Symmetric  Skin: No rashes, lesions or ulcers Psychiatry: Mood & affect appropriate.   Data Reviewed:  Lab results reviewed.  Family Communication: Wife updated at bedside.  Disposition: Status is: Inpatient Remains inpatient appropriate because: Severity of disease, altered mental status.  Unsafe discharge.  Planned Discharge Destination: Skilled nursing facility    Time spent: 35 minutes  Author: Sharen Hones, MD 05/21/2022 3:13 PM  For on call review [email or web address].

## 2022-05-22 DIAGNOSIS — G20 Parkinson's disease: Secondary | ICD-10-CM | POA: Diagnosis not present

## 2022-05-22 DIAGNOSIS — Z789 Other specified health status: Secondary | ICD-10-CM

## 2022-05-22 DIAGNOSIS — E871 Hypo-osmolality and hyponatremia: Secondary | ICD-10-CM | POA: Diagnosis not present

## 2022-05-22 DIAGNOSIS — W19XXXA Unspecified fall, initial encounter: Secondary | ICD-10-CM | POA: Diagnosis not present

## 2022-05-22 DIAGNOSIS — Z515 Encounter for palliative care: Secondary | ICD-10-CM

## 2022-05-22 DIAGNOSIS — E785 Hyperlipidemia, unspecified: Secondary | ICD-10-CM

## 2022-05-22 DIAGNOSIS — T796XXS Traumatic ischemia of muscle, sequela: Secondary | ICD-10-CM | POA: Diagnosis not present

## 2022-05-22 DIAGNOSIS — F03918 Unspecified dementia, unspecified severity, with other behavioral disturbance: Secondary | ICD-10-CM | POA: Diagnosis not present

## 2022-05-22 DIAGNOSIS — R410 Disorientation, unspecified: Secondary | ICD-10-CM

## 2022-05-22 LAB — BASIC METABOLIC PANEL
Anion gap: 9 (ref 5–15)
BUN: 11 mg/dL (ref 8–23)
CO2: 27 mmol/L (ref 22–32)
Calcium: 9.2 mg/dL (ref 8.9–10.3)
Chloride: 97 mmol/L — ABNORMAL LOW (ref 98–111)
Creatinine, Ser: 0.81 mg/dL (ref 0.61–1.24)
GFR, Estimated: >60 mL/min (ref >60)
Glucose, Bld: 135 mg/dL — ABNORMAL HIGH (ref 70–99)
Potassium: 3.4 mmol/L — ABNORMAL LOW (ref 3.5–5.1)
Sodium: 133 mmol/L — ABNORMAL LOW (ref 135–145)

## 2022-05-22 LAB — GLUCOSE, CAPILLARY
Glucose-Capillary: 167 mg/dL — ABNORMAL HIGH (ref 70–99)
Glucose-Capillary: 204 mg/dL — ABNORMAL HIGH (ref 70–99)
Glucose-Capillary: 193 mg/dL — ABNORMAL HIGH (ref 70–99)
Glucose-Capillary: 237 mg/dL — ABNORMAL HIGH (ref 70–99)

## 2022-05-22 NOTE — Consult Note (Signed)
Consultation Note Date: 05/22/2022   Patient Name: Randall [name]  DOB: [birth year]  MRN: [id number]  Age / Sex: 71 y.o., male  PCP: Adin Hector, MD Referring Physician: Loletha Grayer, MD  Reason for Consultation: Establishing goals of care  HPI/Patient Profile: 71 y.o. male  with past medical history of Parkinson's, dementia, type 2 diabetes, hyperlipidemia, hypertension, and falls admitted on 05/17/2022 with fall at home.  Patient was allegedly on the floor for approximately 12 hours prior to arrival at the hospital.  On arrival, CK level was elevated to 8278.  During course of hospitalization patient's CK level has normalized to 397.  However, confusion and dysphagia developed.  PMT has been consulted to discuss goals of care.  Patient is already being followed by outpatient palliative services.  Clinical Assessment and Goals of Care: I have reviewed medical records including EPIC notes, labs and imaging, assessed the patient and then met with patient at bedside to discuss diagnosis prognosis, GOC, EOL wishes, disposition and options.  I introduced Palliative Medicine as specialized medical care for people living with serious illness. It focuses on providing relief from the symptoms and stress of a serious illness. The goal is to improve quality of life for both the patient and the family.  I highlighted that patient is already being followed by outpatient palliative services and he nodded in agreement.  We discussed a brief life review of the patient.  Patient has been married for 39 years.  They have a son and a daughter.  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  As far as functional and nutritional status patient says PTA it was "going as well as occurred".  He states he has had a poor appetite and has had multiple falls at home.  He says that he does not want to  eat a lot because he never feels hungry.  He also shares that when he does eat he is always worried that it is going to "go down the wrong way".  We discussed patient's current illness and what it means in the larger context of patient's on-going co-morbidities. I attempted to elicit values and goals of care important to the patient.  Patient shares he wants to get right with his wife and he wants to be a better husband to her.  He hopes he can return home with her.  He also shares that he listens to the [place] and relies on his faith in [place].  Advance directives, concepts specific to code status, artificial feeding and hydration, and rehospitalization were considered and discussed.  I discussed that patient has MOST form on file reflecting full CODE STATUS.  CODE STATUS discussed in detail.  Patient verbalized that in the event of a cardiopulmonary arrest that he would "take that as a sign from God that it is time to go on".  When asked if he would be accepting of CPR, shocks to his heart, and use of a mechanical ventilator, patient shook his head and  said "no".  Discussed that making these wishes and boundaries of care known to his wife are important.  Given patient's history of dementia, this CODE STATUS discussion needs to be discussed with his wife.  I attempted to speak with his wife over the phone.  No answer.  HIPAA appropriate voicemail left.  Education offered regarding concept specific to human mortality and the limitations of medical interventions to prolong life when the body begins to fail to thrive.  Therapeutic silence and active listening provided for patient to share his thoughts and emotions regarding his current medical situation.  Family is facing treatment option decisions, advanced directive, and anticipatory care needs.  Patient could not recall what the next steps in his plan of care are.  I reviewed that PT and OT have been working with him and are recommending a skilled nursing  facility with rehab.  Patient shares he is in agreement with this as long as he can hopefully return at some point home to be with his wife.  Discussed with patient the importance of continued conversation with family and the medical providers regarding overall plan of care and treatment options, ensuring decisions are within the context of the patient's values and GOCs.    Questions and concerns were addressed.  Patient was encouraged to call with questions or concerns.   Primary Decision Maker NEXT OF KIN  Code Status/Advance Care Planning: Full code  Prognosis:   Unable to determine  Discharge Planning: [place] for rehab with Palliative care service follow-up  Primary Diagnoses: Present on Admission:  Rhabdomyolysis  Leukocytosis  Transaminitis  Hyperbilirubinemia  Parkinson's disease ([place])   Physical Exam Constitutional:      Appearance: Normal appearance.  HENT:     Head: Normocephalic and atraumatic.     Mouth/Throat:     Mouth: Mucous membranes are moist.  Eyes:     Pupils: Pupils are equal, round, and reactive to light.  Cardiovascular:     Rate and Rhythm: Normal rate.     Pulses: Normal pulses.  Pulmonary:     Effort: Pulmonary effort is normal.  Abdominal:     Palpations: Abdomen is soft.  Musculoskeletal:     Comments: Resting tremors - predominantly in right UE  Skin:    General: Skin is warm and dry.  Neurological:     Mental Status: He is alert and oriented to person, place, and time.  Psychiatric:        Mood and Affect: Mood normal.        Behavior: Behavior normal.        Thought Content: Thought content normal.        Judgment: Judgment normal.     Palliative Assessment/Data:  50%     Thank you for this consult. Palliative medicine will continue to follow and assist holistically.   Time Total: 75 minutes Greater than 50%  of this time was spent counseling and coordinating care related to the above assessment and  plan.  Signed by: Jordan Hawks, DNP, FNP-BC Palliative Medicine    Please contact Palliative Medicine Team phone at [phone] for questions and concerns.  For individual provider: See Shea Evans

## 2022-05-22 NOTE — Assessment & Plan Note (Addendum)
Answers questions appropriately.  Continue Seroquel at night.  Patient wants to get out of the hospital.  Patient's wife has she has a bed and insurance authorization was started

## 2022-05-22 NOTE — Assessment & Plan Note (Addendum)
Continue Sinemet and Requip.

## 2022-05-22 NOTE — Assessment & Plan Note (Addendum)
Traumatic rhabdomyolysis secondary to fall.  CPK 4735 on presentation down to 42.

## 2022-05-22 NOTE — Assessment & Plan Note (Signed)
Hold Mevacor with rhabdomyolysis.  Last hemoglobin A1c 5.5.

## 2022-05-22 NOTE — Assessment & Plan Note (Addendum)
Add on liver function test to morning labs.

## 2022-05-22 NOTE — Progress Notes (Signed)
Physical Therapy Treatment Patient Details Name: Randall [name] EVERY MRN: [id number] DOB: 1951/04/03 Today's Date: 05/22/2022   History of Present Illness Pt is a 71 year old male admitted after being found down on floor for 12 hours, admitted with significant rhabdomyolysis; PMH significant for  hypertension, hyperlipidemia, diabetes mellitus type 2, Parkinson's disease with dementia. Pt requried haldol on 05/19/21 2/2 agitation/sundowning.    PT Comments    Pt was sitting in recliner upon arriving. Untouched lunch tray at bedside. Encouraged pt to eat. Continues to state he is not hungry and that he recently finished eating breakfast. Noted resting tremors. He is agreeable to ambulation and standing activity. He required extensive assistance to achieve standing with a lot of vcs and increased time. He was able to ambulate a short distance with min assist. Poor gait quality throughout with unsteadiness noted. Pt's condom catheter fell off during ambulation + urination on floor. Return to recliner and Chief Strategy Officer assisted with hygiene care. RN tech made aware. Pt is not at baseline and will greatly benefit from SNF at DC to maximize independence while decreasing caregiver burden.     Recommendations for follow up therapy are one component of a multi-disciplinary discharge planning process, led by the attending physician.  Recommendations may be updated based on patient status, additional functional criteria and insurance authorization.  Follow Up Recommendations  Skilled nursing-short term rehab (<3 hours/day) Can patient physically be transported by private vehicle: Yes   Assistance Recommended at Discharge Frequent or constant Supervision/Assistance  Patient can return home with the following A little help with walking and/or transfers;Assistance with cooking/housework;A little help with bathing/dressing/bathroom;Assist for transportation;Help with stairs or ramp for entrance;Two people to help with  bathing/dressing/bathroom;Direct supervision/assist for financial management   Equipment Recommendations  None recommended by PT       Precautions / Restrictions Precautions Precautions: Fall Restrictions Weight Bearing Restrictions: No     Mobility  Bed Mobility  General bed mobility comments: in recliner pre/post session    Transfers Overall transfer level: Needs assistance Equipment used: Rolling walker (2 wheels) Transfers: Sit to/from Stand Sit to Stand: Min assist, Mod assist    General transfer comment: Pt continues to have slight posterior push. increased time to perform with vcs for step by step sequencing. pt has resting tremors that improved with standing activity    Ambulation/Gait Ambulation/Gait assistance: Min assist Gait Distance (Feet): 15 Feet Assistive device: Rolling walker (2 wheels) Gait Pattern/deviations: Shuffle, Trunk flexed Gait velocity: decreased     General Gait Details: pt ambulated ~ 15 ft however condom cath fell off and pt urinated on floor. Pt tends to ambulate with shuffling gait pattern. vcs throughout for posture correction and increased step length. Parkinsonian gait.   Balance Overall balance assessment: Needs assistance, History of Falls Sitting-balance support: Feet supported Sitting balance-Leahy Scale: Fair     Standing balance support: Bilateral upper extremity supported, Reliant on assistive device for balance, During functional activity Standing balance-Leahy Scale: Poor Standing balance comment: pt is high fall risk       Cognition Arousal/Alertness: Awake/alert Behavior During Therapy: WFL for tasks assessed/performed Overall Cognitive Status: History of cognitive impairments - at baseline Area of Impairment: Orientation, Attention, Memory, Following commands, Awareness, Safety/judgement, Problem solving    Orientation Level: Disoriented to, Situation Current Attention Level: Sustained   Following Commands:  Follows one step commands consistently, Follows one step commands with increased time Safety/Judgement: Decreased awareness of safety, Decreased awareness of deficits   Problem Solving: Slow processing,  Decreased initiation, Difficulty sequencing, Requires verbal cues, Requires tactile cues General Comments: Pt is A and cooperative however needs increased time to process and respond. Slow moving with tactle cues required throughout to initiate movements               Pertinent Vitals/Pain Pain Assessment Pain Assessment: No/denies pain Faces Pain Scale: No hurt     PT Goals (current goals can now be found in the care plan section) Acute Rehab PT Goals Patient Stated Goal: get better Progress towards PT goals: Progressing toward goals    Frequency    Min 2X/week      PT Plan Current plan remains appropriate       AM-PAC PT "6 Clicks" Mobility   Outcome Measure  Help needed turning from your back to your side while in a flat bed without using bedrails?: A Little Help needed moving from lying on your back to sitting on the side of a flat bed without using bedrails?: A Lot Help needed moving to and from a bed to a chair (including a wheelchair)?: A Little Help needed standing up from a chair using your arms (e.g., wheelchair or bedside chair)?: A Lot Help needed to walk in hospital room?: A Lot Help needed climbing 3-5 steps with a railing? : A Lot 6 Click Score: 14    End of Session Equipment Utilized During Treatment: Gait belt Activity Tolerance: Other (comment) (limited by urination/condom cath falling off during gait) Patient left: in chair;with chair alarm set;with call bell/phone within reach;with family/visitor present Nurse Communication: Mobility status PT Visit Diagnosis: Unsteadiness on feet (R26.81);History of falling (Z91.81);Muscle weakness (generalized) (M62.81);Other abnormalities of gait and mobility (R26.89)     Time: 1250-1306 PT Time Calculation  (min) (ACUTE ONLY): 16 min  Charges:  $Therapeutic Activity: 8-22 mins                     Julaine Fusi PTA 05/22/22, 1:27 PM

## 2022-05-22 NOTE — Progress Notes (Signed)
Occupational Therapy Treatment Patient Details Name: Randall [name] MRN: [id number] DOB: 1951/02/26 Today's Date: 05/22/2022   History of present illness Pt is a 71 year old male admitted after being found down on floor for 12 hours, admitted with significant rhabdomyolysis; PMH significant for  hypertension, hyperlipidemia, diabetes mellitus type 2, Parkinson's disease with dementia. Pt requried haldol on 05/19/21 2/2 agitation/sundowning.   OT comments  Upon entering the room, pt seated in recliner chair and agreeable to OT intervention. Pt is very pleasant and oriented to self. He utilized the [employer] as a Proofreader to answer other orientation questions. Pt standing x 2 reps from chair with significant posterior bias requiring mod A and manual facilitation of anterior weight shift. Pt having increased difficulty sequencing step with RW towards bed and therefore therapist advances RW and pt follows with min -mod A for safety with balance. Pt seated on EOB and needing min A for sit >supine. Pt then appears very fatigued as if he is having a hard time keeping eyes open/focused. All needs within reach and bed alarm activated for safety. Pt continues to benefit from OT intervention.    Recommendations for follow up therapy are one component of a multi-disciplinary discharge planning process, led by the attending physician.  Recommendations may be updated based on patient status, additional functional criteria and insurance authorization.    Follow Up Recommendations  Skilled nursing-short term rehab (<3 hours/day)    Assistance Recommended at Discharge Frequent or constant Supervision/Assistance  Patient can return home with the following  Assistance with cooking/housework;Direct supervision/assist for medications management;A lot of help with walking and/or transfers;A lot of help with bathing/dressing/bathroom;Direct supervision/assist for financial management;Assist for transportation    Equipment Recommendations  Other (comment) (defer to next venue of care)       Precautions / Restrictions Precautions Precautions: Fall Restrictions Weight Bearing Restrictions: No       Mobility Bed Mobility Overal bed mobility: Needs Assistance Bed Mobility: Sit to Supine       Sit to supine: Min assist        Transfers   Equipment used: Rolling walker (2 wheels) Transfers: Sit to/from Stand, Bed to chair/wheelchair/BSC Sit to Stand: Mod assist     Step pivot transfers: Mod assist     General transfer comment: posterior bias in standing and needing increased cuing for motor planning and sequencing for transfer.     Balance Overall balance assessment: Needs assistance, History of Falls Sitting-balance support: Feet supported Sitting balance-Leahy Scale: Fair Sitting balance - Comments: supervision in static sitting Postural control: Posterior lean Standing balance support: Bilateral upper extremity supported, Reliant on assistive device for balance, During functional activity Standing balance-Leahy Scale: Poor Standing balance comment: pt is high fall risk                           ADL either performed or assessed with clinical judgement    Extremity/Trunk Assessment Upper Extremity Assessment Upper Extremity Assessment: Generalized weakness   Lower Extremity Assessment Lower Extremity Assessment: Generalized weakness   Cervical / Trunk Assessment Cervical / Trunk Assessment: Kyphotic    Vision Patient Visual Report: No change from baseline            Cognition Arousal/Alertness: Awake/alert Behavior During Therapy: WFL for tasks assessed/performed Overall Cognitive Status: History of cognitive impairments - at baseline Area of Impairment: Orientation, Attention, Memory, Following commands, Awareness, Safety/judgement, Problem solving  Orientation Level: Disoriented to, Situation Current Attention Level:  Sustained Memory: Decreased recall of precautions Following Commands: Follows one step commands consistently, Follows one step commands with increased time Safety/Judgement: Decreased awareness of safety, Decreased awareness of deficits Awareness: Emergent Problem Solving: Slow processing, Decreased initiation, Difficulty sequencing, Requires verbal cues, Requires tactile cues General Comments: Pt is oriented to self and answers other orientation questions using white board as memory aide. Pt needing increased time to process information and initiate.                   Pertinent Vitals/ Pain       Pain Assessment Pain Assessment: Faces Faces Pain Scale: No hurt         Frequency  Min 2X/week        Progress Toward Goals  OT Goals(current goals can now be found in the care plan section)  Progress towards OT goals: Progressing toward goals  Acute Rehab OT Goals Patient Stated Goal: to return to PLOF OT Goal Formulation: With patient Time For Goal Achievement: 06/02/22 Potential to Achieve Goals: Good  Plan Frequency remains appropriate;Discharge plan needs to be updated       AM-PAC OT "6 Clicks" Daily Activity     Outcome Measure   Help from another person eating meals?: A Little Help from another person taking care of personal grooming?: A Little Help from another person toileting, which includes using toliet, bedpan, or urinal?: A Lot Help from another person bathing (including washing, rinsing, drying)?: A Lot Help from another person to put on and taking off regular upper body clothing?: A Little Help from another person to put on and taking off regular lower body clothing?: A Lot 6 Click Score: 15    End of Session Equipment Utilized During Treatment: Gait belt;Rolling walker (2 wheels)  OT Visit Diagnosis: Unsteadiness on feet (R26.81);Other abnormalities of gait and mobility (R26.89);History of falling (Z91.81)   Activity Tolerance Patient tolerated  treatment well;Patient limited by fatigue   Patient Left with call bell/phone within reach;in bed;with bed alarm set   Nurse Communication Mobility status        Time: 4707-6151 OT Time Calculation (min): 15 min  Charges: OT General Charges $OT Visit: 1 Visit OT Treatments $Therapeutic Activity: 8-22 mins  Darleen Crocker, MS, OTR/L , CBIS ascom [phone]  05/22/22, 3:40 PM

## 2022-05-22 NOTE — Progress Notes (Signed)
FL2 completed. Bed search initiated.

## 2022-05-22 NOTE — Assessment & Plan Note (Signed)
Continue seroquel at night and exelon.  Haldol is not a good medication for parkinsons patients.

## 2022-05-22 NOTE — Plan of Care (Signed)
  Problem: Activity: Goal: Risk for activity intolerance will decrease Outcome: Progressing   Problem: Elimination: Goal: Will not experience complications related to urinary retention Outcome: Progressing   Problem: Safety: Goal: Ability to remain free from injury will improve Outcome: Progressing   

## 2022-05-22 NOTE — Progress Notes (Addendum)
  Progress Note   Patient: Randall [name] YSA:[id number] DOB: [birth year] DOA: 05/17/2022     5 DOS: the patient was seen and examined on 05/22/2022   Brief hospital course: Hanan B Mccowen is a 71 y.o. male with medical history significant of hypertension, hyperlipidemia, diabetes mellitus type 2, Parkinson's disease with dementia presents after having a fall at home.  Patient was on the floor for about 12 hours.  By time he arrived in the hospital, he was found to have significant rhabdomyolysis with CK level of 8278.  Renal function still normal.  He was placed on IV fluids. Patient CK level was normalized on 9/12.  However, patient developed some confusion as well as dysphagia.  PT/OT recommended nursing home placement.    Assessment and Plan: * Rhabdomyolysis Traumatic rhabdomyolysis secondary to fall.  CPK 4735 on presentation down to 397.  Physical therapy recommending rehab.  Acute delirium Patient seems to be answering questions appropriately.  Slept with Seroquel last night.  Parkinson's disease ([place]) Continue Sinemet  Hyponatremia Patient started on salt tablets.  Sodium up to 133.  Likely SIADH.  Type 2 diabetes mellitus with hyperlipidemia ([place]) Hold Mevacor with rhabdomyolysis.  Last hemoglobin A1c 5.5.  Transaminitis AST slightly elevated likely with rhabdomyolysis.  Dementia with behavioral disturbance (HCC) Continue seroquel at night and exelon.  Haldol is not a good medication for parkinsons patients.        Subjective: Patient seen this morning and was sitting in the chair.  Patient not the best historian.  Feels okay.  Offers no complaints.  Physical Exam: Vitals:   05/22/22 0425 05/22/22 0800 05/22/22 0900 05/22/22 0950  BP: (!) 169/87 (!) 160/110 (!) 180/92 127/82  Pulse: 78 80  95  Resp: 18 14    Temp: 98.1 F (36.7 C) 98.1 F (36.7 C)    TempSrc: Oral Oral    SpO2: 99% 92%    Weight:      Height:       Physical Exam HENT:     Head: Normocephalic.      Mouth/Throat:     Pharynx: No oropharyngeal exudate.  Eyes:     General: Lids are normal.     Conjunctiva/sclera: Conjunctivae normal.  Cardiovascular:     Rate and Rhythm: Normal rate and regular rhythm.     Heart sounds: Normal heart sounds, S1 normal and S2 normal.  Pulmonary:     Breath sounds: No decreased breath sounds, wheezing, rhonchi or rales.  Abdominal:     Palpations: Abdomen is soft.     Tenderness: There is no abdominal tenderness.  Musculoskeletal:     Right lower leg: No swelling.     Left lower leg: No swelling.  Skin:    General: Skin is warm.     Findings: No rash.  Neurological:     Mental Status: He is alert.     Comments: Answers questions appropriately.     Data Reviewed: Last CPK 397, creatinine 0.81, potassium 3.4, sodium 133, hemoglobin 12.3  Family Communication: spoke with patient wife on the phone  Disposition: Status is: Inpatient Remains inpatient appropriate because: TOC looking into rehab beds.  Planned Discharge Destination: Skilled nursing facility    Time spent: 28 minutes  Author: Loletha Grayer, MD 05/22/2022 12:14 PM  For on call review [email or web address].

## 2022-05-22 NOTE — NC FL2 (Signed)
Randall [name] LEVEL OF CARE SCREENING TOOL     IDENTIFICATION  Patient Name: Randall [name] Birthdate: 02-17-1951 Sex: male Admission Date (Current Location): 05/17/2022  [facility] and [place] Number:  Engineering geologist and Address:  [facility], [address], [place], [place] [address]      Provider Number: [id number]  Attending Physician Name and Address:  Loletha Grayer, MD  Relative Name and Phone Number:  Georgina Snell ASNKN,[phone]    Current Level of Care: Hospital Recommended Level of Care: 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   Benign essential HTN 02/01/2016   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/2016   Pure hypercholesterolemia 02/01/2016   Type 2 diabetes mellitus with hyperlipidemia ([place]) 01/10/2016   Microalbuminuria 01/10/2016   Degeneration of intervertebral disc of lumbar region 07/13/2015   Parkinson's disease ([place]) 11/16/2012    Orientation RESPIRATION BLADDER Height & Weight     Self, Time, Situation, Place  Normal External catheter Weight: 76.7 kg Height:  '5\' 6"'$  (167.6 cm)  BEHAVIORAL SYMPTOMS/MOOD NEUROLOGICAL BOWEL NUTRITION STATUS   (n/a)  (n/a) Continent Diet (Carb Modified)  AMBULATORY STATUS COMMUNICATION OF NEEDS Skin   Limited Assist Verbally Bruising (Back side left arm)                       Personal Care Assistance  Level of Assistance  Bathing, Dressing Bathing Assistance: Limited assistance   Dressing Assistance: Limited assistance     Functional Limitations Info  Speech     Speech Info: Impaired (Dysphagia)    SPECIAL CARE FACTORS FREQUENCY  PT (By licensed PT), OT (By licensed OT)     PT Frequency: Min 2x weekly OT Frequency: Min 2x weekly            Contractures Contractures Info: Not present    Additional Factors Info  Code Status, Allergies Code Status Info: FULL Allergies Info: Sulfa Antibiotics, Sulfasalazine, Lovastatin, Methylphenidate Hcl, Pravastatin           Current Medications (05/22/2022):  This is the current hospital active medication list Current Facility-Administered Medications  Medication Dose Route Frequency Provider Last Rate Last Admin   acetaminophen (TYLENOL) tablet 650 mg  650 mg Oral Q6H PRN Fuller Plan A, MD   650 mg at 05/20/22 2217   Or   acetaminophen (TYLENOL) suppository 650 mg  650 mg Rectal Q6H PRN Fuller Plan A, MD       albuterol (PROVENTIL) (2.5 MG/3ML) 0.083% nebulizer solution 2.5 mg  2.5 mg Nebulization Q6H PRN Smith, Rondell A, MD       carbidopa-levodopa (SINEMET IR) 25-250 MG per tablet immediate release 2 tablet  2 tablet Oral 5 X Daily Sharen Hones, MD   2 tablet at 05/22/22 2109   enoxaparin (LOVENOX) injection 40 mg  40 mg Subcutaneous Q24H Fuller Plan A, MD   40 mg at 05/21/22 2039   hydrALAZINE (APRESOLINE) injection 10 mg  10 mg Intravenous Q4H PRN Fuller Plan A, MD   10 mg at 05/20/22 1736   insulin aspart (novoLOG) injection 0-5 Units  0-5 Units Subcutaneous QHS Fuller Plan A, MD   2 Units at 05/22/22 2203   insulin aspart (novoLOG) injection 0-6 Units  0-6 Units Subcutaneous TID WC Fuller Plan A, MD   1 Units at 05/22/22 1654   multivitamin with minerals tablet 1 tablet  1 tablet Oral Daily Sharen Hones, MD   1 tablet at 05/22/22 0846   ondansetron (ZOFRAN) tablet 4 mg  4 mg Oral Q6H PRN Fuller Plan A, MD        Or   ondansetron (ZOFRAN) injection 4 mg  4 mg Intravenous Q6H PRN Smith, Rondell A, MD       protein supplement (ENSURE MAX) liquid  11 oz Oral BID Sharen Hones, MD   11 oz at 05/22/22 2112   QUEtiapine (SEROQUEL) tablet 75 mg  75 mg Oral QHS Smith, Rondell A, MD   75 mg at 05/22/22 2109   rivastigmine (EXELON) capsule 1.5 mg  1.5 mg Oral BID Fuller Plan A, MD   1.5 mg at 05/22/22 2109   rOPINIRole (REQUIP) tablet 2 mg  2 mg Oral QID Fuller Plan A, MD   2 mg at 05/22/22 2109   senna-docusate (Senokot-S) tablet 1 tablet  1 tablet Oral BID PRN Mansy, Jan A, MD   1 tablet at 05/22/22 2109   sodium chloride flush (NS) 0.9 % injection 3 mL  3 mL Intravenous Q12H Smith, Rondell A, MD   3 mL at 05/22/22 2112   sodium chloride tablet 1 g  1 g Oral TID WC Sharen Hones, MD   1 g at 05/22/22 1655     Discharge Medications: Please see discharge summary for a list of discharge medications.  Relevant Imaging Results:  Relevant Lab Results:   Additional Information SS# [id number]  Laurena Slimmer, RN

## 2022-05-22 NOTE — Assessment & Plan Note (Addendum)
Discontinue salt tablets.  Sodium up to 138.  Likely SIADH.

## 2022-05-23 DIAGNOSIS — R410 Disorientation, unspecified: Secondary | ICD-10-CM | POA: Diagnosis not present

## 2022-05-23 DIAGNOSIS — K5900 Constipation, unspecified: Secondary | ICD-10-CM | POA: Diagnosis not present

## 2022-05-23 DIAGNOSIS — T796XXS Traumatic ischemia of muscle, sequela: Secondary | ICD-10-CM | POA: Diagnosis not present

## 2022-05-23 DIAGNOSIS — G20 Parkinson's disease: Secondary | ICD-10-CM | POA: Diagnosis not present

## 2022-05-23 LAB — GLUCOSE, CAPILLARY
Glucose-Capillary: 184 mg/dL — ABNORMAL HIGH (ref 70–99)
Glucose-Capillary: 162 mg/dL — ABNORMAL HIGH (ref 70–99)
Glucose-Capillary: 175 mg/dL — ABNORMAL HIGH (ref 70–99)
Glucose-Capillary: 198 mg/dL — ABNORMAL HIGH (ref 70–99)

## 2022-05-23 MED ORDER — BISACODYL 10 MG RE SUPP
10.0000 mg | Freq: Once | RECTAL | Status: AC
  Administered 2022-05-23: 10 mg via RECTAL
  Filled 2022-05-23: qty 1

## 2022-05-23 MED ORDER — POLYETHYLENE GLYCOL 3350 17 G PO PACK
17.0000 g | PACK | Freq: Every day | ORAL | Status: DC
  Administered 2022-05-23 – 2022-05-26 (×3): 17 g via ORAL
  Filled 2022-05-23 (×4): qty 1

## 2022-05-23 NOTE — Progress Notes (Signed)
  Progress Note   Patient: Randall [name] OIB:[id number] DOB: 01/27/1951 DOA: 05/17/2022     6 DOS: the patient was seen and examined on 05/23/2022   Brief hospital course: Randall [name] is a 71 y.o. male with medical history significant of hypertension, hyperlipidemia, diabetes mellitus type 2, Parkinson's disease with dementia presents after having a fall at home.  Patient was on the floor for about 12 hours.  By time he arrived in the hospital, he was found to have significant rhabdomyolysis with CK level of 8278.  Renal function still normal.  He was placed on IV fluids. Patient CK level was normalized on 9/12.  However, patient developed some confusion as well as dysphagia.  PT/OT recommended nursing home placement.    Assessment and Plan: * Rhabdomyolysis Traumatic rhabdomyolysis secondary to fall.  CPK 4735 on presentation down to 397.  Physical therapy recommending rehab.  Acute delirium This has improved.  Answers questions appropriately.  Continue Seroquel at night.  Parkinson's disease ([place]) Continue Sinemet  Hyponatremia Patient started on salt tablets.  Sodium up to 133.  Likely SIADH.  Type 2 diabetes mellitus with hyperlipidemia ([place]) Hold Mevacor with rhabdomyolysis.  Last hemoglobin A1c 5.5.  Transaminitis AST slightly elevated likely with rhabdomyolysis.  Constipation MiraLAX ordered today.  Will order Dulcolax suppository also  Dementia with behavioral disturbance (HCC) Continue seroquel at night and exelon.  Haldol is not a good medication for parkinsons patients.        Subjective: Patient complains of some constipation.  Feeling a little bit stronger.  Walked a little bit better with the mobility specialist.  Still needed quite a bit to help to get up and to stay steady.  Physical Exam: Vitals:   05/22/22 1546 05/22/22 2108 05/23/22 0500 05/23/22 0838  BP: (!) 143/92 134/81 (!) 157/81 133/71  Pulse: 97 89 77 91  Resp: '18 18 18 16  '$ Temp: 98.4 F (36.9 C)  97.9 F (36.6 C) 97.9 F (36.6 C) 98.7 F (37.1 C)  TempSrc: Oral     SpO2: 91% 96% 98% 98%  Weight:      Height:       Physical Exam HENT:     Head: Normocephalic.     Mouth/Throat:     Pharynx: No oropharyngeal exudate.  Eyes:     General: Lids are normal.     Conjunctiva/sclera: Conjunctivae normal.  Cardiovascular:     Rate and Rhythm: Normal rate and regular rhythm.     Heart sounds: Normal heart sounds, S1 normal and S2 normal.  Pulmonary:     Breath sounds: No decreased breath sounds, wheezing, rhonchi or rales.  Abdominal:     Palpations: Abdomen is soft.     Tenderness: There is no abdominal tenderness.  Musculoskeletal:     Right lower leg: No swelling.     Left lower leg: No swelling.  Skin:    General: Skin is warm.     Findings: No rash.  Neurological:     Mental Status: He is alert.     Comments: Answers questions appropriately.  Resting tremor right arm.     Data Reviewed: Sodium 133, last potassium 3.4  Family Communication: Tried to call wife  Disposition: Status is: Inpatient Remains inpatient appropriate because: Looking into rehab options.  Planned Discharge Destination: Rehab    Time spent: 28 minutes  Author: Loletha Grayer, MD 05/23/2022 3:23 PM  For on call review [email or web address].

## 2022-05-23 NOTE — Progress Notes (Signed)
Mobility Specialist - Progress Note   05/23/22 1700  Mobility  Activity Transferred from chair to bed  Level of Assistance Minimal assist, patient does 75% or more  Assistive Device Front wheel walker  Distance Ambulated (ft) 2 ft  Activity Response Tolerated well  $Mobility charge 1 Mobility     Pt slumped down into chair upon arrival requiring assistance to reposition in chair for safe standing. Still with R lateral lean in sitting. Pt stood with modA and ambulated to bed with cueing for sequencing steps with RW. Assist to return supine. Pt left in bed with alarm set, needs in reach. NT at bedside.    Kathee Delton Mobility Specialist 05/23/22, 5:05 PM

## 2022-05-23 NOTE — Progress Notes (Signed)
Spoke with Randall [name], Admissions director at [facility]. Bed not offered due to patient history of dementia and behaviors.

## 2022-05-23 NOTE — Progress Notes (Signed)
Mobility Specialist - Progress Note   05/23/22 1100  Mobility  Activity Ambulated with assistance in hallway;Transferred from bed to chair  Level of Assistance Minimal assist, patient does 75% or more  Assistive Device Front wheel walker  Distance Ambulated (ft) 160 ft  Activity Response Tolerated well  $Mobility charge 1 Mobility     Pt sitting on BSC upon arrival with NT present. Unsuccessful in BM attempt. TotalA for peri-care d/t need for BUE support. Required modA for STS from [facility] with posterior lean noted; tactile cueing for hand placement and anterior weight shift. Ambulated hallway with minA for occasional RW navigation, especially during turns. Noted knee buckling x1 but no overt LOB with staggered gait. Pt returned to chair with max physical assist needed to hip-shift into chair. Pt follows commands intermittently and sits before safe to do so. Pillow wedge support provided d/t R lateral lean in sitting. Pt left in chair with alarm set, needs in reach.    Kathee Delton Mobility Specialist 05/23/22, 11:23 AM

## 2022-05-23 NOTE — Assessment & Plan Note (Signed)
MiraLAX ordered today.  Will order Dulcolax suppository also

## 2022-05-23 NOTE — Care Management Important Message (Signed)
Important Message  Patient Details  Name: Randall [name] MRN: [id number] Date of Birth: 12/07/1950   Medicare Important Message Given:  Yes     Loann Quill 05/23/2022, 11:39 AM

## 2022-05-24 DIAGNOSIS — R410 Disorientation, unspecified: Secondary | ICD-10-CM | POA: Diagnosis not present

## 2022-05-24 DIAGNOSIS — G20 Parkinson's disease: Secondary | ICD-10-CM | POA: Diagnosis not present

## 2022-05-24 DIAGNOSIS — W19XXXA Unspecified fall, initial encounter: Secondary | ICD-10-CM | POA: Diagnosis not present

## 2022-05-24 DIAGNOSIS — Z66 Do not resuscitate: Secondary | ICD-10-CM

## 2022-05-24 DIAGNOSIS — F03918 Unspecified dementia, unspecified severity, with other behavioral disturbance: Secondary | ICD-10-CM | POA: Diagnosis not present

## 2022-05-24 DIAGNOSIS — E871 Hypo-osmolality and hyponatremia: Secondary | ICD-10-CM | POA: Diagnosis not present

## 2022-05-24 DIAGNOSIS — T796XXS Traumatic ischemia of muscle, sequela: Secondary | ICD-10-CM | POA: Diagnosis not present

## 2022-05-24 LAB — BASIC METABOLIC PANEL
Anion gap: 9 (ref 5–15)
BUN: 23 mg/dL (ref 8–23)
CO2: 28 mmol/L (ref 22–32)
Calcium: 9.2 mg/dL (ref 8.9–10.3)
Chloride: 100 mmol/L (ref 98–111)
Creatinine, Ser: 0.85 mg/dL (ref 0.61–1.24)
GFR, Estimated: >60 mL/min (ref >60)
Glucose, Bld: 180 mg/dL — ABNORMAL HIGH (ref 70–99)
Potassium: 3.8 mmol/L (ref 3.5–5.1)
Sodium: 137 mmol/L (ref 135–145)

## 2022-05-24 LAB — URINALYSIS, ROUTINE W REFLEX MICROSCOPIC
Bacteria, UA: NONE SEEN
Bilirubin Urine: NEGATIVE
Glucose, UA: >=500 mg/dL — AB
Hgb urine dipstick: NEGATIVE
Ketones, ur: 20 mg/dL — AB
Leukocytes,Ua: NEGATIVE
Nitrite: NEGATIVE
Protein, ur: NEGATIVE mg/dL
Specific Gravity, Urine: 1.027 (ref 1.005–1.030)
Squamous Epithelial / HPF: NONE SEEN (ref 0–5)
pH: 6 (ref 5.0–8.0)

## 2022-05-24 LAB — GLUCOSE, CAPILLARY
Glucose-Capillary: 213 mg/dL — ABNORMAL HIGH (ref 70–99)
Glucose-Capillary: 219 mg/dL — ABNORMAL HIGH (ref 70–99)
Glucose-Capillary: 220 mg/dL — ABNORMAL HIGH (ref 70–99)
Glucose-Capillary: 208 mg/dL — ABNORMAL HIGH (ref 70–99)

## 2022-05-24 LAB — HEMOGLOBIN: Hemoglobin: 13 g/dL (ref 13.0–17.0)

## 2022-05-24 MED ORDER — SODIUM CHLORIDE 0.9 % IV SOLN: INTRAVENOUS | Status: DC

## 2022-05-24 MED ORDER — LACTULOSE 10 GM/15ML PO SOLN
30.0000 g | Freq: Once | ORAL | Status: AC
  Administered 2022-05-24: 30 g via ORAL
  Filled 2022-05-24: qty 60

## 2022-05-24 MED ORDER — SODIUM CHLORIDE 0.9 % IV BOLUS
500.0000 mL | Freq: Once | INTRAVENOUS | Status: AC
  Administered 2022-05-24: 500 mL via INTRAVENOUS

## 2022-05-24 MED ORDER — SODIUM CHLORIDE 1 G PO TABS
1.0000 g | ORAL_TABLET | Freq: Two times a day (BID) | ORAL | Status: DC
  Administered 2022-05-25 – 2022-05-26 (×3): 1 g via ORAL
  Filled 2022-05-24 (×4): qty 1

## 2022-05-24 NOTE — TOC Progression Note (Signed)
Transition of Care [facility]) - Progression Note    Patient Details  Name: Randall [name] MRN: [id number] Date of Birth: 12-Mar-1951  [facility] CM/SW Contact  Laurena Slimmer, RN Phone Number: 05/24/2022, 3:30 PM  Clinical Narrative:    Spoke with patient's wife to give bed offers for [facility] and [facility] She will make a  decision by Monday. Advised patient's wife Josem Kaufmann would still be needed.    Expected Discharge Plan: [place] Barriers to Discharge: Continued Medical Work up  Expected Discharge Plan and Services Expected Discharge Plan: [place] Choice: [place] Community arrangements for the past 2 months: Single Family Home                                       Social Determinants of Health (SDOH) Interventions Utilities Interventions: Intervention Not Indicated  Readmission Risk Interventions     No data to display

## 2022-05-24 NOTE — Progress Notes (Signed)
Occupational Therapy Treatment Patient Details Name: Randall [name] MRN: [id number] DOB: 07-Oct-1950 Today's Date: 05/24/2022   History of present illness Pt is a 71 year old male admitted after being found down on floor for 12 hours, admitted with significant rhabdomyolysis; PMH significant for  hypertension, hyperlipidemia, diabetes mellitus type 2, Parkinson's disease with dementia. Pt requried haldol on 05/19/21 2/2 agitation/sundowning.   OT comments  Pt in recliner upon arrival and agreeable to OT services. Pt difficult to understand and increased tremors observed. Pt required min A for sit <> stand transfers with RW. Pt required frequent VC for hand placement on RW. Very slow gait when ambulating in room. Pt was able to perform grooming task  while standing (brushing teeth) with set/supervision. While standing at sink pt required frequent VC to assist in stabilizing himself. Pt encourage to take steps backwards to transition back to EOB; pt unable to follow cues. Pt required min-mod A for bed mobility. Pt left in bed with call bell in reach, bed alarm set and all needs met.    Recommendations for follow up therapy are one component of a multi-disciplinary discharge planning process, led by the attending physician.  Recommendations may be updated based on patient status, additional functional criteria and insurance authorization.    Follow Up Recommendations  Skilled nursing-short term rehab (<3 hours/day)    Assistance Recommended at Discharge Frequent or constant Supervision/Assistance  Patient can return home with the following  Assistance with cooking/housework;Direct supervision/assist for medications management;A lot of help with walking and/or transfers;A lot of help with bathing/dressing/bathroom;Direct supervision/assist for financial management;Assist for transportation   Equipment Recommendations  Other (comment) (Defer to next venue of care.)       Precautions / Restrictions  Precautions Precautions: Fall Restrictions Weight Bearing Restrictions: No       Mobility Bed Mobility               General bed mobility comments: Pt in recliner upon arrival.    Transfers   Equipment used: Rolling walker (2 wheels) Transfers: Sit to/from Stand Sit to Stand: Min assist                 Balance Overall balance assessment: Needs assistance Sitting-balance support: Feet supported, Bilateral upper extremity supported Sitting balance-Leahy Scale: Fair     Standing balance support: Bilateral upper extremity supported, Reliant on assistive device for balance, During functional activity Standing balance-Leahy Scale: Poor Standing balance comment: extremely high fall risk                           ADL either performed or assessed with clinical judgement   ADL Overall ADL's : Needs assistance/impaired     Grooming: Standing;Set up;Supervision/safety                                      Extremity/Trunk Assessment Upper Extremity Assessment Upper Extremity Assessment: Generalized weakness   Lower Extremity Assessment Lower Extremity Assessment: Generalized weakness         Cognition Arousal/Alertness: Awake/alert Behavior During Therapy: WFL for tasks assessed/performed Overall Cognitive Status: History of cognitive impairments - at baseline                     Current Attention Level: Sustained   Following Commands: Follows one step commands with increased time Safety/Judgement: Decreased awareness of safety, Decreased awareness  of deficits Awareness: Emergent Problem Solving: Slow processing, Decreased initiation, Difficulty sequencing, Requires verbal cues, Requires tactile cues                General Comments pt's tremors are inconsistent throughout. More difficulty with talking/being understood today.    Pertinent Vitals/ Pain       Pain Assessment Pain Assessment: No/denies  pain   Frequency  Min 2X/week        Progress Toward Goals  OT Goals(current goals can now be found in the care plan section)  Progress towards OT goals: Progressing toward goals  Acute Rehab OT Goals Patient Stated Goal: to return to PLOF. OT Goal Formulation: With patient Time For Goal Achievement: 06/02/22 Potential to Achieve Goals: Good  Plan Frequency remains appropriate;Discharge plan needs to be updated       AM-PAC OT "6 Clicks" Daily Activity     Outcome Measure   Help from another person eating meals?: A Little Help from another person taking care of personal grooming?: A Little Help from another person toileting, which includes using toliet, bedpan, or urinal?: A Lot Help from another person bathing (including washing, rinsing, drying)?: A Lot Help from another person to put on and taking off regular upper body clothing?: A Little Help from another person to put on and taking off regular lower body clothing?: A Lot 6 Click Score: 15    End of Session    OT Visit Diagnosis: Unsteadiness on feet (R26.81);Other abnormalities of gait and mobility (R26.89);History of falling (Z91.81)      Patient Left with call bell/phone within reach;in bed;with bed alarm set   Nurse Communication Mobility status        Time: 1040-1103 OT Time Calculation (min): 23 min  Charges: OT Treatments $Self Care/Home Management : 8-22 mins $Therapeutic Activity: 8-22 mins    Starsky Nanna, OTS 05/24/2022, 12:59 PM

## 2022-05-24 NOTE — Progress Notes (Signed)
Nutrition Follow-up  DOCUMENTATION CODES:   Not applicable  INTERVENTION:   -Continue Magic cup TID with meals, each supplement provides 290 kcal and 9 grams of protein  -Continue MVI with minerals daily -Downgrade diet to dysphagia 3 for ease of intake -Continue Ensure Max po BID, each supplement provides 150 kcal and 30 grams of protein.    NUTRITION DIAGNOSIS:   Inadequate oral intake related to acute illness as evidenced by per patient/family report.  Ongoing  GOAL:   Patient will meet greater than or equal to 90% of their needs  Progressing  MONITOR:   PO intake, Supplement acceptance, Labs, Weight trends, Skin, I & O's  REASON FOR ASSESSMENT:   Malnutrition Screening Tool    ASSESSMENT:   71 y.o. male with medical history significant of hypertension, hyperlipidemia, OSA, depression, diabetes mellitus type 2 and Parkinson's disease with dementia who is admitted with traumatic rhabdomyolysis secondary to fall.  9/12- s/p BSE- recommending dysphagia 3 diet  Reviewed I/O's: +720 ml x 24 hours and +1.3 L since admission   Pt unavailable at time of visit. Attempted to speak with pt via call to hospital room phone, however, unable to reach.   Pt currently on a carb modified diet. Noted meal completions variable; PO 5-100%, however, has improved over the past 24 hours. Pt is drinking Ensure Max supplements and prefers them due to chocolate flavor.   Palliative care following for goals of care; pt desires full scope of care at this time. Plan for SNF placement at discharge with palliative care service follow-up.   Medications reviewed and include sinemet, miralax, and sodium chloride.   Labs reviewed: CBGS: 162-237 (inpatient orders for glycemic control are 0-5 units insulin aspart daily at bedtime and 0-6 units insulin aspart TID with meals).    Diet Order:   Diet Order             Diet Carb Modified Fluid consistency: Thin; Room service appropriate? Yes with  Assist; Fluid restriction: 1200 mL Fluid  Diet effective now                   EDUCATION NEEDS:   Education needs have been addressed  Skin:  Skin Assessment: Reviewed RN Assessment  Last BM:  05/21/22  Height:   Ht Readings from Last 1 Encounters:  05/17/22 '5\' 6"'$  (1.676 m)    Weight:   Wt Readings from Last 1 Encounters:  05/21/22 76.7 kg    Ideal Body Weight:  59 kg  BMI:  Body mass index is 27.29 kg/m.  Estimated Nutritional Needs:   Kcal:  1800-2100kcal/day  Protein:  90-105g/day  Fluid:  1.7-2.9L/day    Loistine Chance, RD, LDN, [place] Registered Dietitian II Certified Diabetes Care and Education Specialist Please refer to [facility] for RD and/or RD on-call/weekend/after hours pager

## 2022-05-24 NOTE — Progress Notes (Signed)
Physical Therapy Treatment Patient Details Name: Randall [name] MRN: [id number] DOB: 30-Jul-1951 Today's Date: 05/24/2022   History of Present Illness Pt is a 71 year old male admitted after being found down on floor for 12 hours, admitted with significant rhabdomyolysis; PMH significant for  hypertension, hyperlipidemia, diabetes mellitus type 2, Parkinson's disease with dementia. Pt requried haldol on 05/19/21 2/2 agitation/sundowning.    PT Comments    Pt was long sitting in bed upon arriving. RN reports only ate a few bites of breakfast. He is A and O x 3 but lacks good insight of current situation. He does present with more difficulty to understand his speech with increased mouth/facial tremors. Was able to exit L side of bed with extensive assistance. Stood to RW at [facility] but present with severe posterior push/LOB. Once pt is able to fwd wt shift, is able to progress to ambulation. Min assist mostly however Mod assist during turns and occasionally due to scissoring/narrow BOS. Overall tolerated session well. Md arrived at conclusion of session. UA ordered due to dark urine concerns. PT continues to recommend DC to SNF and will continue to follow per current POC.    Recommendations for follow up therapy are one component of a multi-disciplinary discharge planning process, led by the attending physician.  Recommendations may be updated based on patient status, additional functional criteria and insurance authorization.  Follow Up Recommendations  Skilled nursing-short term rehab (<3 hours/day) Can patient physically be transported by private vehicle: Yes   Assistance Recommended at Discharge Frequent or constant Supervision/Assistance  Patient can return home with the following A little help with walking and/or transfers;Assistance with cooking/housework;A little help with bathing/dressing/bathroom;Assist for transportation;Help with stairs or ramp for entrance;Two people to help with  bathing/dressing/bathroom;Direct supervision/assist for financial management   Equipment Recommendations  None recommended by PT       Precautions / Restrictions Precautions Precautions: Fall Restrictions Weight Bearing Restrictions: No     Mobility  Bed Mobility Overal bed mobility: Needs Assistance Bed Mobility: Supine to Sit  Supine to sit: HOB elevated, Mod assist  General bed mobility comments: mod assist to safely exit L side of bed.    Transfers Overall transfer level: Needs assistance Equipment used: Rolling walker (2 wheels) Transfers: Sit to/from Stand Sit to Stand: Min assist, Mod assist    General transfer comment: pt has initial posterior push but once able to wt shift fwd was able to progress away from EOB. Heavy use of gait belt throughout all standing activity.    Ambulation/Gait Ambulation/Gait assistance: Min assist, Mod assist Gait Distance (Feet): 75 Feet Assistive device: Rolling walker (2 wheels) Gait Pattern/deviations: Step-through pattern, Staggering right, Staggering left, Scissoring, Shuffle Gait velocity: decreased     General Gait Details: pt mostly requires min assist however mod assist with turning and occasional due to scissoring/narrow BOS.    Balance Overall balance assessment: Needs assistance, History of Falls Sitting-balance support: Feet supported Sitting balance-Leahy Scale: Fair     Standing balance support: Bilateral upper extremity supported, Reliant on assistive device for balance, During functional activity Standing balance-Leahy Scale: Poor Standing balance comment: extremely high fall risk      Cognition Arousal/Alertness: Awake/alert Behavior During Therapy: WFL for tasks assessed/performed Overall Cognitive Status: History of cognitive impairments - at baseline    Orientation Level: Disoriented to, Situation     Following Commands: Follows one step commands with increased time Safety/Judgement: Decreased  awareness of safety, Decreased awareness of deficits     General  Comments: Pt is alert and cooperative however author questions if pt understands current situation or has insight of his current deficits.           General Comments General comments (skin integrity, edema, etc.): pt's tremors are inconsistent throughout. More difficulty with talking/being understood today.      Pertinent Vitals/Pain Pain Assessment Pain Assessment: No/denies pain Faces Pain Scale: No hurt Pain Intervention(s): Monitored during session, Limited activity within patient's tolerance     PT Goals (current goals can now be found in the care plan section) Acute Rehab PT Goals Patient Stated Goal: rehab then home Progress towards PT goals: Progressing toward goals    Frequency    Min 2X/week      PT Plan Current plan remains appropriate       AM-PAC PT "6 Clicks" Mobility   Outcome Measure  Help needed turning from your back to your side while in a flat bed without using bedrails?: A Little Help needed moving from lying on your back to sitting on the side of a flat bed without using bedrails?: A Lot Help needed moving to and from a bed to a chair (including a wheelchair)?: A Lot Help needed standing up from a chair using your arms (e.g., wheelchair or bedside chair)?: A Lot Help needed to walk in hospital room?: A Lot Help needed climbing 3-5 steps with a railing? : A Lot 6 Click Score: 13    End of Session Equipment Utilized During Treatment: Gait belt Activity Tolerance: Patient tolerated treatment well Patient left: in chair;with chair alarm set;with call bell/phone within reach;with family/visitor present Nurse Communication: Mobility status PT Visit Diagnosis: Unsteadiness on feet (R26.81);History of falling (Z91.81);Muscle weakness (generalized) (M62.81);Other abnormalities of gait and mobility (R26.89)     Time: 8786-7672 PT Time Calculation (min) (ACUTE ONLY): 23 min  Charges:   $Gait Training: 8-22 mins $Therapeutic Activity: 8-22 mins                    Julaine Fusi PTA 05/24/22, 12:26 PM

## 2022-05-24 NOTE — Progress Notes (Signed)
Palliative Care Progress Note, Assessment & Plan   Patient Name: Randall [name]       Date: 05/24/2022 DOB: 1951-06-12  Age: 71 y.o. MRN#: [id number] Attending Physician: Loletha Grayer, MD Primary Care Physician: Adin Hector, MD Admit Date: 05/17/2022  Reason for Consultation/Follow-up: Establishing goals of care  Subjective: During my first attempt in the visit, patient was up and walking with PT.  He was using a rolling walker.  During my second visit, patient was lying in bed in no apparent distress.  He acknowledged my presence and is able to make his wishes known.  His wife is at bedside.  HPI: 71 y.o. male  with past medical history of Parkinson's, dementia, type 2 diabetes, hyperlipidemia, hypertension, and falls admitted on 05/17/2022 with fall at home.  Patient was allegedly on the floor for approximately 12 hours prior to arrival at the hospital.  On arrival, CK level was elevated to 8278.  During course of hospitalization patient's CK level has normalized to 397.  However, confusion and dysphagia developed.   PMT has been consulted to discuss goals of care.  Summary of counseling/coordination of care: After reviewing th patient's chart and assessing the patient at bedside, I spoke with patient and his wife in regards to disposition, prognosis, and goals of care.  Introduced palliative medicine to patient's wife.  Inquired about any existing advance care documents, such as a living will and HCPOA.  Wife shares she and her husband have created a living well believes there are healthcare decisions within this documentation.  She shares she will bring a copy of it to the hospital.  Discussed risk of aspiration and discussed aspiration precautions as outlined by SLP therapist.  Patient shares he  understands that his saliva is probably "going down the wrong way" but that he is "doing the best I can".  Patient again reiterated that it would not be within guys planned for him to have another way to get food if he is unable to eat and drink on his own.  I shared details of the conversation I had with the patient 2 days ago when he made his wishes known.  Specifically, I touched on patient's CODE STATUS, artificial feeding, and boundaries to his care.  I conveyed patient's wishes were to allow a natural death in the event of a cardiopulmonary arrest.  Patient's wife is in agreement, saying she knows he would not ever want to be hooked up to machines.  Discussed DNR and MOST form.  Patient was in agreement to change CODE STATUS to DNR and allow a natural death in the event of a cardiopulmonary arrest.  Wife at bedside was also in agreement.  I completed a MOST form today. The patient and wife outlined their wishes for the following treatment decisions:  Cardiopulmonary Resuscitation: Do Not Attempt Resuscitation (DNR/No CPR)  Medical Interventions: Limited Additional Interventions: Use medical treatment, IV fluids and cardiac monitoring as indicated, DO NOT USE intubation or mechanical ventilation. May consider use of less invasive airway support such as BiPAP or CPAP. Also provide comfort measures. Transfer to the hospital if indicated. Avoid intensive care.   Antibiotics: Antibiotics if indicated  IV Fluids: No  IV fluids (provide other measures to ensure comfort)  Feeding Tube: No feeding tube    Copies of MOST and DNR given to the patient's wife.  DNR and MOST uploaded to the [facility]. RN Wyatt Portela made aware of change of code status.  Goals are clear.  Plan is set to discharge to SNF for rehab with outpatient palliative services to follow (already established with [facility]).  PMT will shadow the patient's chart and reengage at patient/family's request, if goals change, or  it patient's health deteriorates during hospitalization.   Code Status: DNR  Prognosis: Unable to determine  Discharge Planning: SNF for rehab  Physical Exam Vitals reviewed.  Constitutional:      General: He is not in acute distress. HENT:     Head: Normocephalic.     Mouth/Throat:     Mouth: Mucous membranes are moist.  Eyes:     Pupils: Pupils are equal, round, and reactive to light.  Cardiovascular:     Rate and Rhythm: Normal rate.     Pulses: Normal pulses.  Pulmonary:     Effort: Pulmonary effort is normal.  Abdominal:     Palpations: Abdomen is soft.  Musculoskeletal:     Comments: Tremor or right hand, generalized weakness d/t Parkinsons  Skin:    General: Skin is warm and dry.  Neurological:     Mental Status: He is alert and oriented to person, place, and time. Mental status is at baseline.  Psychiatric:        Mood and Affect: Mood normal.        Behavior: Behavior normal.        Thought Content: Thought content normal.        Judgment: Judgment normal.             Palliative Assessment/Data: 50%    Total Time 50 minutes  Greater than 50%  of this time was spent counseling and coordinating care related to the above assessment and plan.  Thank you for allowing the Palliative Medicine Team to assist in the care of this patient.  [place] Ilsa Iha, FNP-BC Palliative Medicine Team Team Phone # [phone]

## 2022-05-24 NOTE — TOC Progression Note (Signed)
Transition of Care [facility]) - Progression Note    Patient Details  Name: Randall [name] MRN: [id number] Date of Birth: 1951/08/29  Transition of Care [facility]) CM/SW Contact  Laurena Slimmer, RN Phone Number: 05/24/2022, 11:21 AM  Clinical Narrative:    Attempt to reach patient wife to give bed offers. No answer.    Expected Discharge Plan: [place] Barriers to Discharge: Continued Medical Work up  Expected Discharge Plan and Services Expected Discharge Plan: [place] Choice: [place] arrangements for the past 2 months: Single Family Home                                       Social Determinants of Health (SDOH) Interventions Utilities Interventions: Intervention Not Indicated  Readmission Risk Interventions     No data to display

## 2022-05-24 NOTE — Progress Notes (Signed)
Progress Note   Patient: Randall [name] LKG:[id number] DOB: August 18, 1951 DOA: 05/17/2022     7 DOS: the patient was seen and examined on 05/24/2022   Brief hospital course: Laymond B Dilauro is a 71 y.o. male with medical history significant of hypertension, hyperlipidemia, diabetes mellitus type 2, Parkinson's disease with dementia presents after having a fall at home.  Patient was on the floor for about 12 hours.  By time he arrived in the hospital, he was found to have significant rhabdomyolysis with CK level of 8278.  Renal function still normal.  He was placed on IV fluids. Patient CK level was normalized on 9/12.  However, patient developed some confusion as well as dysphagia.  PT/OT recommended nursing home placement.    On 05/24/2022 urine very dark.  Patient given a fluid bolus and urine cleared up a little bit.  No blood in the urine.  Assessment and Plan: * Rhabdomyolysis Traumatic rhabdomyolysis secondary to fall.  CPK 4735 on presentation down to 397.  Physical therapy recommending rehab.  Acute delirium This has resolved.  Answers questions appropriately.  Continue Seroquel at night.  Parkinson's disease ([place]) Continue Sinemet  Hyponatremia Decrease salt tablets to twice a day.  Sodium up to 137.  Likely SIADH.  Type 2 diabetes mellitus with hyperlipidemia ([place]) Hold Mevacor with rhabdomyolysis.  Last hemoglobin A1c 5.5.  Transaminitis AST slightly elevated likely with rhabdomyolysis.  Constipation MiraLAX ordered daily.  Will give a dose of lactulose.  Dementia with behavioral disturbance ([facility]) Continue seroquel at night and exelon.  Haldol is not a good medication for parkinsons patients.  Urine very dark today.  Fluid bolus given and urine cleared up a little bit.  No blood in the urine.      Subjective: Patient feeling okay.  Still has not had a bowel movement.  Not eating much.  Urine this morning very dark.  Physical Exam: Vitals:   05/23/22 2129 05/24/22 0000  05/24/22 0410 05/24/22 0804  BP: (!) 88/58 102/63 126/74 (!) 152/93  Pulse: 91 84 87 82  Resp: '18 17 16   '$ Temp: 98.1 F (36.7 C) 98.2 F (36.8 C) 98.6 F (37 C) 97.9 F (36.6 C)  TempSrc:  Oral Oral   SpO2: 97% 96% 98% 100%  Weight:      Height:       Physical Exam HENT:     Head: Normocephalic.     Mouth/Throat:     Pharynx: No oropharyngeal exudate.  Eyes:     General: Lids are normal.     Conjunctiva/sclera: Conjunctivae normal.  Cardiovascular:     Rate and Rhythm: Normal rate and regular rhythm.     Heart sounds: Normal heart sounds, S1 normal and S2 normal.  Pulmonary:     Breath sounds: No decreased breath sounds, wheezing, rhonchi or rales.  Abdominal:     Palpations: Abdomen is soft.     Tenderness: There is no abdominal tenderness.  Musculoskeletal:     Right lower leg: No swelling.     Left lower leg: No swelling.  Skin:    General: Skin is warm.     Findings: No rash.  Neurological:     Mental Status: He is alert.     Comments: Answers questions appropriately.  Resting tremor right arm.     Data Reviewed: Sodium 137, potassium 3.8, creatinine 0.85, hemoglobin 13  Family Communication: Left message for wife on the phone  Disposition: Status is: Inpatient Remains inpatient appropriate because: The  patient's wife will need to choose a rehab bed and Will need insurance authorization  Planned Discharge Destination: Rehab    Time spent: 27 minutes  Author: Loletha Grayer, MD 05/24/2022 2:06 PM  For on call review [email or web address].

## 2022-05-25 DIAGNOSIS — R531 Weakness: Secondary | ICD-10-CM

## 2022-05-25 DIAGNOSIS — R410 Disorientation, unspecified: Secondary | ICD-10-CM | POA: Diagnosis not present

## 2022-05-25 DIAGNOSIS — R627 Adult failure to thrive: Secondary | ICD-10-CM | POA: Diagnosis not present

## 2022-05-25 DIAGNOSIS — T796XXS Traumatic ischemia of muscle, sequela: Secondary | ICD-10-CM | POA: Diagnosis not present

## 2022-05-25 DIAGNOSIS — G20 Parkinson's disease: Secondary | ICD-10-CM | POA: Diagnosis not present

## 2022-05-25 LAB — GLUCOSE, CAPILLARY
Glucose-Capillary: 194 mg/dL — ABNORMAL HIGH (ref 70–99)
Glucose-Capillary: 158 mg/dL — ABNORMAL HIGH (ref 70–99)
Glucose-Capillary: 220 mg/dL — ABNORMAL HIGH (ref 70–99)
Glucose-Capillary: 173 mg/dL — ABNORMAL HIGH (ref 70–99)

## 2022-05-25 MED ORDER — ROPINIROLE HCL 1 MG PO TABS
2.0000 mg | ORAL_TABLET | Freq: Every day | ORAL | Status: DC
  Administered 2022-05-25 – 2022-06-19 (×118): 2 mg via ORAL
  Filled 2022-05-25 (×126): qty 2

## 2022-05-25 MED ORDER — BISACODYL 10 MG RE SUPP
10.0000 mg | Freq: Once | RECTAL | Status: AC
  Administered 2022-05-25: 10 mg via RECTAL
  Filled 2022-05-25: qty 1

## 2022-05-25 MED ORDER — ROPINIROLE HCL 1 MG PO TABS: 2.0000 mg | ORAL_TABLET | Freq: Every day | ORAL | Status: DC | PRN

## 2022-05-25 MED ORDER — BISACODYL 5 MG PO TBEC
5.0000 mg | DELAYED_RELEASE_TABLET | Freq: Once | ORAL | Status: AC
  Administered 2022-05-25: 5 mg via ORAL
  Filled 2022-05-25: qty 1

## 2022-05-25 MED ORDER — ROPINIROLE HCL 1 MG PO TABS: 2.0000 mg | ORAL_TABLET | Freq: Four times a day (QID) | ORAL | Status: DC

## 2022-05-25 NOTE — TOC Progression Note (Signed)
Transition of Care [facility]) - Progression Note    Patient Details  Name: Randall [name] MRN: [id number] Date of Birth: [birth year]  Transition of Care [facility]) CM/SW Contact  Zigmund Daniel Dorian Pod, RN Phone [phone] 05/25/2022, 10:43 AM  Clinical Narrative:    RN spoke with the spouse [name] concerning available bed offers. Spouse will discuss with pt and has requested a call back tomorrow with a decision prior to authorization for SNF.  Will follow up once again on tomorrow for SNF option.   Expected Discharge Plan: [place] Barriers to Discharge: Continued Medical Work up  Expected Discharge Plan and Services Expected Discharge Plan: [place] Choice: [place] arrangements for the past 2 months: Single Family Home                                       Social Determinants of Health (SDOH) Interventions Utilities Interventions: Intervention Not Indicated  Readmission Risk Interventions     No data to display

## 2022-05-25 NOTE — Progress Notes (Signed)
Progress Note   Patient: Randall [name]:[id number] DOB: 07-01-1951 DOA: 05/17/2022     8 DOS: the patient was seen and examined on 05/25/2022   Brief hospital course: Randall [name] is a 71 y.o. male with medical history significant of hypertension, hyperlipidemia, diabetes mellitus type 2, Parkinson's disease with dementia presents after having a fall at home.  Patient was on the floor for about 12 hours.  By time he arrived in the hospital, he was found to have significant rhabdomyolysis with CK level of 8278.  Renal function still normal.  He was placed on IV fluids. Patient CK level was normalized on 9/12.  However, patient developed some confusion as well as dysphagia.  PT/OT recommended nursing home placement.    On 05/24/2022 urine very dark.  Patient given a fluid bolus and urine cleared up a little bit.  No blood in the urine.  On 05/26/2019.  Patient was having actual tremor and difficulty eating and taking meds.  Assessment and Plan: * Failure to thrive in adult Patient having difficulty eating Parkinson's and mild tremor.  Patient on a dysphagia 3 diet with thin liquids.  Appreciate palliative and speech therapy following.  Patient is a DNR.  Rhabdomyolysis Traumatic rhabdomyolysis secondary to fall.  CPK 4735 on presentation down to 397.  Physical therapy recommending rehab.  Acute delirium This has resolved.  Answers questions appropriately.  Continue Seroquel at night.  Parkinson's disease ([place]) Continue Sinemet and Requip.  Hyponatremia Decrease salt tablets to twice a day.  Sodium up to 137.  Likely SIADH.  Type 2 diabetes mellitus with hyperlipidemia ([place]) Hold Mevacor with rhabdomyolysis.  Last hemoglobin A1c 5.5.  Transaminitis AST slightly elevated likely with rhabdomyolysis.  Weakness Patient needs a lot of help to get up out of bed or out of the chair.  But once he is up he is able to walk.  Constipation MiraLAX ordered daily.  Collect suppository in the  proximal pill orally ordered.  Dementia with behavioral disturbance (HCC) Continue seroquel at night and exelon.  Haldol is not a good medication for parkinsons patients.        Subjective: Patient having a tremor.  Still with constipation.  Having difficulty eating and taking his meds today.  Physical Exam: Vitals:   05/24/22 2121 05/25/22 0630 05/25/22 0828 05/25/22 1236  BP: 121/74 (!) 154/78 131/69 (!) 153/89  Pulse: 79 79 71 70  Resp: '16 18 17 17  '$ Temp: (!) 97.3 F (36.3 C) 97.9 F (36.6 C) 98.7 F (37.1 C) 98.2 F (36.8 C)  TempSrc:      SpO2: 97% 97% 96% 100%  Weight:      Height:       Physical Exam HENT:     Head: Normocephalic.     Mouth/Throat:     Pharynx: No oropharyngeal exudate.  Eyes:     General: Lids are normal.     Conjunctiva/sclera: Conjunctivae normal.  Cardiovascular:     Rate and Rhythm: Normal rate and regular rhythm.     Heart sounds: Normal heart sounds, S1 normal and S2 normal.  Pulmonary:     Breath sounds: No decreased breath sounds, wheezing, rhonchi or rales.  Abdominal:     Palpations: Abdomen is soft.     Tenderness: There is no abdominal tenderness.  Musculoskeletal:     Right lower leg: No swelling.     Left lower leg: No swelling.  Skin:    General: Skin is warm.  Findings: No rash.  Neurological:     Mental Status: He is alert.     Comments: Answers questions appropriately.  Resting tremor right arm.  Mouth and jaw tremor today.     Data Reviewed: Sodium 137, hemoglobin 13  Family Communication: Spoke with wife at the bedside  Disposition: Status is: Inpatient Remains inpatient appropriate because: We will need to take a rehab bed and that insurance authorization will need to happen  Planned Discharge Destination: Rehab    Time spent: 28 minutes  Author: Loletha Grayer, MD 05/25/2022 3:57 PM  For on call review [email or web address].

## 2022-05-25 NOTE — Assessment & Plan Note (Addendum)
Physical therapy recommending rehab.

## 2022-05-25 NOTE — Assessment & Plan Note (Addendum)
Patient having difficulty eating with Parkinson's and tremors.  Patient on a dysphagia 3 diet with thin liquids.  Appreciate palliative and speech therapy following.  Patient is a DNR.  Patient not eating very much and urine is very dark.  I fed the patient 3 bites of Magic cup this morning and 2 sips of Ensure.  Palliative care can follow at facility.

## 2022-05-26 DIAGNOSIS — G20 Parkinson's disease: Secondary | ICD-10-CM | POA: Diagnosis not present

## 2022-05-26 DIAGNOSIS — R627 Adult failure to thrive: Secondary | ICD-10-CM | POA: Diagnosis not present

## 2022-05-26 DIAGNOSIS — T796XXS Traumatic ischemia of muscle, sequela: Secondary | ICD-10-CM | POA: Diagnosis not present

## 2022-05-26 DIAGNOSIS — R410 Disorientation, unspecified: Secondary | ICD-10-CM | POA: Diagnosis not present

## 2022-05-26 DIAGNOSIS — E876 Hypokalemia: Secondary | ICD-10-CM

## 2022-05-26 LAB — GLUCOSE, CAPILLARY
Glucose-Capillary: 173 mg/dL — ABNORMAL HIGH (ref 70–99)
Glucose-Capillary: 164 mg/dL — ABNORMAL HIGH (ref 70–99)
Glucose-Capillary: 217 mg/dL — ABNORMAL HIGH (ref 70–99)

## 2022-05-26 LAB — BASIC METABOLIC PANEL
Anion gap: 11 (ref 5–15)
BUN: 22 mg/dL (ref 8–23)
CO2: 25 mmol/L (ref 22–32)
Calcium: 9.2 mg/dL (ref 8.9–10.3)
Chloride: 101 mmol/L (ref 98–111)
Creatinine, Ser: 0.7 mg/dL (ref 0.61–1.24)
GFR, Estimated: >60 mL/min (ref >60)
Glucose, Bld: 167 mg/dL — ABNORMAL HIGH (ref 70–99)
Potassium: 3.4 mmol/L — ABNORMAL LOW (ref 3.5–5.1)
Sodium: 137 mmol/L (ref 135–145)

## 2022-05-26 LAB — CBC
HCT: 40.1 % (ref 39.0–52.0)
Hemoglobin: 12.7 g/dL — ABNORMAL LOW (ref 13.0–17.0)
MCH: 29.2 pg (ref 26.0–34.0)
MCHC: 31.7 g/dL (ref 30.0–36.0)
MCV: 92.2 fL (ref 80.0–100.0)
Platelets: 370 10*3/uL (ref 150–400)
RBC: 4.35 MIL/uL (ref 4.22–5.81)
RDW: 14.2 % (ref 11.5–15.5)
WBC: 6.8 10*3/uL (ref 4.0–10.5)
nRBC: 0 % (ref 0.0–0.2)

## 2022-05-26 MED ORDER — SODIUM CHLORIDE 1 G PO TABS
1.0000 g | ORAL_TABLET | Freq: Every day | ORAL | Status: DC
  Administered 2022-05-27: 1 g via ORAL
  Filled 2022-05-26: qty 1

## 2022-05-26 MED ORDER — LACTULOSE 10 GM/15ML PO SOLN
30.0000 g | Freq: Two times a day (BID) | ORAL | Status: DC
  Administered 2022-05-26 – 2022-05-27 (×2): 30 g via ORAL
  Filled 2022-05-26 (×2): qty 60

## 2022-05-26 MED ORDER — BISACODYL 10 MG RE SUPP: 10.0000 mg | Freq: Every day | RECTAL | Status: DC | PRN

## 2022-05-26 MED ORDER — LACTULOSE 10 GM/15ML PO SOLN: 30.0000 g | Freq: Once | ORAL | Status: DC

## 2022-05-26 MED ORDER — POLYETHYLENE GLYCOL 3350 17 G PO PACK
17.0000 g | PACK | Freq: Every day | ORAL | Status: DC
  Administered 2022-05-26 – 2022-05-27 (×2): 17 g via ORAL
  Filled 2022-05-26 (×2): qty 1

## 2022-05-26 MED ORDER — POTASSIUM CHLORIDE 10 MEQ/100ML IV SOLN
10.0000 meq | INTRAVENOUS | Status: AC
  Administered 2022-05-26 (×2): 10 meq via INTRAVENOUS
  Filled 2022-05-26 (×2): qty 100

## 2022-05-26 MED ORDER — POLYETHYLENE GLYCOL 3350 17 G PO PACK: 17.0000 g | PACK | Freq: Two times a day (BID) | ORAL | Status: DC

## 2022-05-26 NOTE — Assessment & Plan Note (Signed)
Replaced. °

## 2022-05-26 NOTE — Progress Notes (Signed)
Progress Note   Patient: Randall [name] MHD:[id number] DOB: 29-Sep-1950 DOA: 05/17/2022     9 DOS: the patient was seen and examined on 05/26/2022   Brief hospital course: Randall [name] is a 71 y.o. male with medical history significant of hypertension, hyperlipidemia, diabetes mellitus type 2, Parkinson's disease with dementia presents after having a fall at home.  Patient was on the floor for about 12 hours.  By time he arrived in the hospital, he was found to have significant rhabdomyolysis with CK level of 8278.  Renal function still normal.  He was placed on IV fluids. Patient CK level was normalized on 9/12.  However, patient developed some confusion as well as dysphagia.  PT/OT recommended nursing home placement.    On 05/24/2022 urine very dark.  Patient given a fluid bolus and urine cleared up a little bit.  No blood in the urine.  On 05/26/2019.  Patient was having actual tremor and difficulty eating and taking meds.  Assessment and Plan: * Failure to thrive in adult Patient having difficulty eating with Parkinson's and tremors.  Patient on a dysphagia 3 diet with thin liquids.  Appreciate palliative and speech therapy following.  Patient is a DNR.  Rhabdomyolysis Traumatic rhabdomyolysis secondary to fall.  CPK 4735 on presentation down to 397.  Physical therapy recommending rehab.  Acute delirium This has resolved.  Answers questions appropriately.  Continue Seroquel at night.  Parkinson's disease ([place]) Continue Sinemet and Requip.  Hyponatremia Decrease salt tablets to once per day.  Sodium up to 137.  Likely SIADH.  Type 2 diabetes mellitus with hyperlipidemia ([place]) Hold Mevacor with rhabdomyolysis.  Last hemoglobin A1c 5.5.  Transaminitis AST slightly elevated likely with rhabdomyolysis.  Hypokalemia Replace potassium IV today  Weakness Patient needs a lot of help to get up out of bed or out of the chair.  But once he is up he is able to walk.  Constipation Continue  MiraLAX daily.  We will give lactulose twice a day until bowel movement then make as needed.  Dementia with behavioral disturbance (HCC) Continue seroquel at night and exelon.  Haldol is not a good medication for parkinsons patients.        Subjective: Patient upset about the way he has been treated.  States that people state that they will come back in 5 minutes and then they come back in 2 and half hours.  Patient asking to go home.  Physical Exam: Vitals:   05/25/22 2129 05/26/22 0535 05/26/22 0844 05/26/22 1612  BP: (!) 151/86 (!) 166/93 (!) 167/93 (!) 147/90  Pulse: 71 79 97 81  Resp: '16 16 18 16  '$ Temp: (!) 97.5 F (36.4 C) (!) 97.5 F (36.4 C) 98.4 F (36.9 C) 97.8 F (36.6 C)  TempSrc: Oral Oral Oral   SpO2: 99% 100% 100% 99%  Weight:      Height:       Physical Exam HENT:     Head: Normocephalic.     Mouth/Throat:     Pharynx: No oropharyngeal exudate.  Eyes:     General: Lids are normal.     Conjunctiva/sclera: Conjunctivae normal.  Cardiovascular:     Rate and Rhythm: Normal rate and regular rhythm.     Heart sounds: Normal heart sounds, S1 normal and S2 normal.  Pulmonary:     Breath sounds: No decreased breath sounds, wheezing, rhonchi or rales.  Abdominal:     Palpations: Abdomen is soft.     Tenderness: There  is no abdominal tenderness.  Musculoskeletal:     Right lower leg: No swelling.     Left lower leg: No swelling.  Skin:    General: Skin is warm.     Findings: No rash.  Neurological:     Mental Status: He is alert.     Comments: Answers questions appropriately.  Resting tremor right arm.     Data Reviewed: Potassium 3.4, hemoglobin 12.7  Family Communication: Spoke with wife on the phone  Disposition: Status is: Inpatient Remains inpatient appropriate because: Wife needs to choose a rehab and then will need insurance authorization.  Planned Discharge Destination: Rehab    Time spent: 27 minutes  Author: Loletha Grayer,  MD 05/26/2022 4:52 PM  For on call review [email or web address].

## 2022-05-26 NOTE — Plan of Care (Signed)

## 2022-05-26 NOTE — TOC Progression Note (Signed)
Transition of Care [facility]) - Progression Note    Patient Details  Name: Randall [name] MRN: [id number] Date of Birth: Feb 02, 1951  Transition of Care [facility]) CM/SW Contact  Zigmund Daniel Dorian Pod, RN Phone [phone] 05/26/2022, 1:27 PM  Clinical Narrative:    Spoke with the spouse Randall [name] concerning bed offers once again. Spouse did not wish to choose a bed offered from the SNF noted and indicated she wanted to reach out directly to [facility] as she and the pt were [place] and wanted to once again speak with there administrative dept concerning the requested SNF for the pt.   TOC will continue to be available and follow up accordingly concerning SNF level of care or this pt.   Expected Discharge Plan: [place] Barriers to Discharge: Continued Medical Work up  Expected Discharge Plan and Services Expected Discharge Plan: [place] Choice: [place] arrangements for the past 2 months: Single Family Home                                       Social Determinants of Health (SDOH) Interventions Utilities Interventions: Intervention Not Indicated  Readmission Risk Interventions     No data to display

## 2022-05-27 ENCOUNTER — Inpatient Hospital Stay: Payer: PPO

## 2022-05-27 DIAGNOSIS — R410 Disorientation, unspecified: Secondary | ICD-10-CM | POA: Diagnosis not present

## 2022-05-27 DIAGNOSIS — R627 Adult failure to thrive: Secondary | ICD-10-CM | POA: Diagnosis not present

## 2022-05-27 DIAGNOSIS — T796XXS Traumatic ischemia of muscle, sequela: Secondary | ICD-10-CM | POA: Diagnosis not present

## 2022-05-27 DIAGNOSIS — G20 Parkinson's disease: Secondary | ICD-10-CM | POA: Diagnosis not present

## 2022-05-27 LAB — BASIC METABOLIC PANEL
Anion gap: 7 (ref 5–15)
BUN: 23 mg/dL (ref 8–23)
CO2: 28 mmol/L (ref 22–32)
Calcium: 9.1 mg/dL (ref 8.9–10.3)
Chloride: 103 mmol/L (ref 98–111)
Creatinine, Ser: 0.68 mg/dL (ref 0.61–1.24)
GFR, Estimated: >60 mL/min (ref >60)
Glucose, Bld: 177 mg/dL — ABNORMAL HIGH (ref 70–99)
Potassium: 3.8 mmol/L (ref 3.5–5.1)
Sodium: 138 mmol/L (ref 135–145)

## 2022-05-27 LAB — HEPATIC FUNCTION PANEL
ALT: 6 U/L (ref 0–44)
AST: 25 U/L (ref 15–41)
Albumin: 3.6 g/dL (ref 3.5–5.0)
Alkaline Phosphatase: 69 U/L (ref 38–126)
Bilirubin, Direct: 0.3 mg/dL — ABNORMAL HIGH (ref 0.0–0.2)
Indirect Bilirubin: 1.6 mg/dL — ABNORMAL HIGH (ref 0.3–0.9)
Total Bilirubin: 1.9 mg/dL — ABNORMAL HIGH (ref 0.3–1.2)
Total Protein: 6.3 g/dL — ABNORMAL LOW (ref 6.5–8.1)

## 2022-05-27 LAB — CK: Total CK: 42 U/L — ABNORMAL LOW (ref 49–397)

## 2022-05-27 MED ORDER — FLEET ENEMA 7-19 GM/118ML RE ENEM
1.0000 | ENEMA | Freq: Once | RECTAL | Status: AC
  Administered 2022-05-27: 1 via RECTAL

## 2022-05-27 MED ORDER — POLYETHYLENE GLYCOL 3350 17 G PO PACK
17.0000 g | PACK | Freq: Every day | ORAL | Status: DC | PRN
  Administered 2022-06-01 – 2022-06-04 (×2): 17 g via ORAL
  Filled 2022-05-27 (×2): qty 1

## 2022-05-27 MED ORDER — OLANZAPINE 10 MG IM SOLR
5.0000 mg | Freq: Once | INTRAMUSCULAR | Status: AC
  Administered 2022-05-27: 5 mg via INTRAMUSCULAR
  Filled 2022-05-27: qty 10

## 2022-05-27 MED ORDER — LACTULOSE 10 GM/15ML PO SOLN
30.0000 g | Freq: Every day | ORAL | Status: DC
  Administered 2022-05-29 – 2022-05-30 (×2): 30 g via ORAL
  Filled 2022-05-27 (×3): qty 60

## 2022-05-27 MED ORDER — SODIUM CHLORIDE 0.9 % IV BOLUS
500.0000 mL | Freq: Once | INTRAVENOUS | Status: AC
  Administered 2022-05-27: 500 mL via INTRAVENOUS

## 2022-05-27 NOTE — Progress Notes (Signed)
Progress Note   Patient: Randall [name] TOI:[id number] DOB: [birth year] DOA: 05/17/2022     10 DOS: the patient was seen and examined on 05/27/2022   Brief hospital course: Randall [name] is a 71 y.o. male with medical history significant of hypertension, hyperlipidemia, diabetes mellitus type 2, Parkinson's disease with dementia presents after having a fall at home.  Patient was on the floor for about 12 hours.  By time he arrived in the hospital, he was found to have significant rhabdomyolysis with CK level of 8278.  Renal function still normal.  He was placed on IV fluids. Patient CK level was normalized on 9/12.  However, patient developed some confusion as well as dysphagia.  PT/OT recommended nursing home placement.    On 05/24/2022 urine very dark.  Patient given a fluid bolus and urine cleared up a little bit.  No blood in the urine.  On 05/26/2019.  Patient was having actual tremor and difficulty eating and taking meds.  Assessment and Plan: * Failure to thrive in adult Patient having difficulty eating with Parkinson's and tremors.  Patient on a dysphagia 3 diet with thin liquids.  Appreciate palliative and speech therapy following.  Patient is a DNR.  Patient not eating very much and urine is very dark.  We will give a fluid bolus today.  Rhabdomyolysis Traumatic rhabdomyolysis secondary to fall.  CPK 4735 on presentation down to 42.    Acute delirium Answers questions appropriately.  Continue Seroquel at night.  Patient wants to get out of the hospital.  Parkinson's disease ([place]) Continue Sinemet and Requip 5 times a day  Hyponatremia Decreased salt tablets to once per day.  Sodium up to 138.  Likely SIADH.  Type 2 diabetes mellitus with hyperlipidemia ([place]) Hold Mevacor with rhabdomyolysis.  Last hemoglobin A1c 5.5.  Transaminitis Add on liver function test to morning labs.  Hypokalemia Replace potassium IV today  Weakness Patient needs a lot of help to get up out of bed or  out of the chair.  But once he is up he is able to walk.  Constipation Continue MiraLAX daily.  We will give lactulose twice a day until bowel movement then make as needed.  Dementia with behavioral disturbance (HCC) Continue seroquel at night and exelon.  Haldol is not a good medication for parkinsons patients.        Subjective: Patient wanting to go home.  Patient's wife very concerned because she is also on a walker secondary to a fractured kneecap.  Patient admitted after fall and found to have rhabdomyolysis.  Physical Exam: Vitals:   05/26/22 1612 05/26/22 2052 05/27/22 0510 05/27/22 0840  BP: (!) 147/90 (!) 150/91 (!) 160/77 (!) 142/78  Pulse: 81 73 (!) 103 81  Resp: '16 18 18 17  '$ Temp: 97.8 F (36.6 C) (!) 97.4 F (36.3 C)  98.3 F (36.8 C)  TempSrc:      SpO2: 99% 96% 100% 99%  Weight:      Height:       Physical Exam HENT:     Head: Normocephalic.     Mouth/Throat:     Pharynx: No oropharyngeal exudate.  Eyes:     General: Lids are normal.     Conjunctiva/sclera: Conjunctivae normal.  Cardiovascular:     Rate and Rhythm: Normal rate and regular rhythm.     Heart sounds: Normal heart sounds, S1 normal and S2 normal.  Pulmonary:     Breath sounds: No decreased breath sounds, wheezing, rhonchi  or rales.  Abdominal:     Palpations: Abdomen is soft.     Tenderness: There is no abdominal tenderness.  Musculoskeletal:     Right lower leg: No swelling.     Left lower leg: No swelling.  Skin:    General: Skin is warm.     Findings: No rash.  Neurological:     Mental Status: He is alert.     Comments: Answers questions appropriately.  Resting tremor right arm.     Data Reviewed: Dark urine.  Sodium 138, creatinine 0.68  Family Communication: Spoke with wife at the bedside  Disposition: Status is: Inpatient Remains inpatient appropriate because: Patient's wife trying to reach a facility to speak with them.  Patient's wife needs to choose a facility  pending insurance authorization needs to happen prior to disposition  Planned Discharge Destination: Rehab    Time spent: 29 minutes  Author: Loletha Grayer, MD 05/27/2022 3:25 PM  For on call review [email or web address].

## 2022-05-27 NOTE — Progress Notes (Addendum)
Spoke with Seth Bake in admissions at [facility]. Request for initial referral to be resent for consideration. Referral resent in [place].   11:00am Spoke with spouse at bedside. Wife advised TOC would require an choice for bed offer. Advised of right to appeal decision if she was unlikely to accept current bed offer.

## 2022-05-27 NOTE — Progress Notes (Signed)
OT Cancellation Note  Patient Details Name: Randall [name] MRN: [id number] DOB: [birth year]   Cancelled Treatment:    Reason Eval/Treat Not Completed: Other (comment). RN reports pt with increased agitation this afternoon and has been given haldol. OT checked on pt who politely declined OT intervention at this time. OT to re-attempt when pt is able to actively participate.   Darleen Crocker, MS, OTR/L , CBIS ascom [phone]  05/27/22, 2:57 PM

## 2022-05-27 NOTE — Progress Notes (Signed)
Physical Therapy Treatment Patient Details Name: Randall [name] MRN: [id number] DOB: Nov 01, 1950 Today's Date: 05/27/2022   History of Present Illness Pt is a 71 year old male admitted after being found down on floor for 12 hours, admitted with significant rhabdomyolysis; PMH significant for  hypertension, hyperlipidemia, diabetes mellitus type 2, Parkinson's disease with dementia. Pt required haldol on 05/19/21 2/2 agitation/sundowning.    PT Comments    Pt resting in bed upon PT arrival; pt agreeable to PT session; pt's wife present.  During session pt min to mod assist semi-supine to sitting edge of bed; min to mod assist with transfers using RW; and min to mod assist (plus 2nd assist for safety) ambulating 160 feet with RW use.  During gait, pt initially with step through gait pattern but with fatigue pt with increased gait speed and more shuffling gait (assist required to keep RW closer to pt and to slow down); more narrow BOS also noted.  Pt assisted back to bed end of session and repositioned for comfort.  Will continue to focus on strengthening, balance, and progressive functional mobility per pt tolerance.   Recommendations for follow up therapy are one component of a multi-disciplinary discharge planning process, led by the attending physician.  Recommendations may be updated based on patient status, additional functional criteria and insurance authorization.  Follow Up Recommendations  Skilled nursing-short term rehab (<3 hours/day) Can patient physically be transported by private vehicle: No   Assistance Recommended at Discharge Frequent or constant Supervision/Assistance  Patient can return home with the following Assistance with cooking/housework;Assist for transportation;Help with stairs or ramp for entrance;Direct supervision/assist for financial management;A lot of help with walking and/or transfers;A lot of help with bathing/dressing/bathroom   Equipment Recommendations  Rolling  walker (2 wheels);BSC/3in1;Wheelchair (measurements PT);Wheelchair cushion (measurements PT)    Recommendations for Other Services       Precautions / Restrictions Precautions Precautions: Fall Precaution Comments: Aspiration Restrictions Weight Bearing Restrictions: No     Mobility  Bed Mobility Overal bed mobility: Needs Assistance Bed Mobility: Supine to Sit, Sit to Supine     Supine to sit: Min assist, Mod assist, HOB elevated (assist for trunk) Sit to supine: +2 for safety/equipment (2 assist back to bed and to reposition using bed pad)   General bed mobility comments: vc's for technique    Transfers Overall transfer level: Needs assistance Equipment used: Rolling walker (2 wheels) Transfers: Sit to/from Stand Sit to Stand: Min assist, Mod assist, +2 safety/equipment           General transfer comment: vc's for UE/LE placement (x2 trials); assist to initiate stand up to RW and control descent sitting    Ambulation/Gait Ambulation/Gait assistance: Min assist, Mod assist, +2 safety/equipment (2nd assist for safety) Gait Distance (Feet): 160 Feet Assistive device: Rolling walker (2 wheels)   Gait velocity: decreased and then increased     General Gait Details: initially step through gait pattern but with fatigue pt with increased gait speed and more shuffling gait (assist to keep RW closer to pt and to slow down); more narrow BOS   Stairs             Wheelchair Mobility    Modified Rankin (Stroke Patients Only)       Balance Overall balance assessment: Needs assistance Sitting-balance support: Bilateral upper extremity supported, Feet supported Sitting balance-Leahy Scale: Poor Sitting balance - Comments: pt with 1 loss of balance posterior R requiring assist for balance Postural control: Posterior lean Standing balance  support: Bilateral upper extremity supported, Reliant on assistive device for balance, During functional activity Standing  balance-Leahy Scale: Poor Standing balance comment: assist for balance with ambulation required                            Cognition Arousal/Alertness: Awake/alert Behavior During Therapy: WFL for tasks assessed/performed Overall Cognitive Status: History of cognitive impairments - at baseline Area of Impairment: Orientation, Attention, Memory, Following commands, Awareness, Safety/judgement, Problem solving                 Orientation Level: Disoriented to, Situation Current Attention Level: Sustained Memory: Decreased recall of precautions Following Commands: Follows one step commands with increased time Safety/Judgement: Decreased awareness of safety, Decreased awareness of deficits Awareness: Emergent Problem Solving: Slow processing, Decreased initiation, Difficulty sequencing, Requires verbal cues, Requires tactile cues          Exercises Total Joint Exercises Long Arc Quad: AROM, Strengthening, Both, 10 reps, Seated General Exercises - Upper Extremity Elbow Flexion: AROM, Strengthening, Both, 10 reps, Seated Elbow Extension: AROM, Strengthening, Both, 10 reps, Seated General Exercises - Lower Extremity Hip Flexion/Marching: AROM, Strengthening, Both, 10 reps, Seated Other Exercises Other Exercises: sitting x10 shoulder elevation and x10 hand open/close exercises (to increase circulation prior to ambulation)    General Comments  Nursing cleared pt for participation in physical therapy.  Pt agreeable to PT session.  Pt intermittently difficult to understand.      Pertinent Vitals/Pain Pain Assessment Pain Assessment: No/denies pain Pain Intervention(s): Limited activity within patient's tolerance, Monitored during session, Repositioned    Home Living                          Prior Function            PT Goals (current goals can now be found in the care plan section) Acute Rehab PT Goals Patient Stated Goal: rehab then home PT Goal  Formulation: With patient/family Time For Goal Achievement: 06/02/22 Potential to Achieve Goals: Good Progress towards PT goals: Progressing toward goals    Frequency    Min 2X/week      PT Plan Current plan remains appropriate    Co-evaluation              AM-PAC PT "6 Clicks" Mobility   Outcome Measure  Help needed turning from your back to your side while in a flat bed without using bedrails?: A Little Help needed moving from lying on your back to sitting on the side of a flat bed without using bedrails?: A Lot Help needed moving to and from a bed to a chair (including a wheelchair)?: A Lot Help needed standing up from a chair using your arms (e.g., wheelchair or bedside chair)?: A Lot Help needed to walk in hospital room?: A Lot Help needed climbing 3-5 steps with a railing? : A Lot 6 Click Score: 13    End of Session Equipment Utilized During Treatment: Gait belt Activity Tolerance: Patient tolerated treatment well Patient left: in bed;with call bell/phone within reach;with bed alarm set;with family/visitor present (fall mat in place) Nurse Communication: Mobility status;Precautions PT Visit Diagnosis: Unsteadiness on feet (R26.81);History of falling (Z91.81);Muscle weakness (generalized) (M62.81);Other abnormalities of gait and mobility (R26.89)     Time: 1962-2297 PT Time Calculation (min) (ACUTE ONLY): 25 min  Charges:  $Gait Training: 8-22 mins $Therapeutic Activity: 8-22 mins  Leitha Bleak, PT 05/27/22, 11:31 AM

## 2022-05-28 DIAGNOSIS — G20 Parkinson's disease: Secondary | ICD-10-CM | POA: Diagnosis not present

## 2022-05-28 DIAGNOSIS — T796XXS Traumatic ischemia of muscle, sequela: Secondary | ICD-10-CM | POA: Diagnosis not present

## 2022-05-28 DIAGNOSIS — R627 Adult failure to thrive: Secondary | ICD-10-CM | POA: Diagnosis not present

## 2022-05-28 DIAGNOSIS — E876 Hypokalemia: Secondary | ICD-10-CM

## 2022-05-28 DIAGNOSIS — R410 Disorientation, unspecified: Secondary | ICD-10-CM | POA: Diagnosis not present

## 2022-05-28 MED ORDER — LORAZEPAM 0.5 MG PO TABS
0.5000 mg | ORAL_TABLET | Freq: Four times a day (QID) | ORAL | Status: DC | PRN
  Administered 2022-05-28 – 2022-06-18 (×12): 0.5 mg via ORAL
  Filled 2022-05-28 (×12): qty 1

## 2022-05-28 MED ORDER — SODIUM CHLORIDE 0.9 % IV BOLUS
500.0000 mL | Freq: Once | INTRAVENOUS | Status: AC
  Administered 2022-05-28: 500 mL via INTRAVENOUS

## 2022-05-28 MED ORDER — OLANZAPINE 10 MG IM SOLR
2.5000 mg | Freq: Once | INTRAMUSCULAR | Status: AC
  Administered 2022-05-28: 2.5 mg via INTRAMUSCULAR
  Filled 2022-05-28: qty 10

## 2022-05-28 MED ORDER — GABAPENTIN 300 MG PO CAPS
300.0000 mg | ORAL_CAPSULE | Freq: Three times a day (TID) | ORAL | Status: DC
  Administered 2022-05-28 – 2022-06-19 (×64): 300 mg via ORAL
  Filled 2022-05-28 (×65): qty 1

## 2022-05-28 NOTE — Progress Notes (Signed)
Patient become verbally aggressive with bedside nurse and not following safety commands. Patient able to be calmed and assisted back to bed. Continues to be verbally aggressive with primary nurse. Care of patient assumed by this RN.

## 2022-05-28 NOTE — Progress Notes (Signed)
PT Cancellation Note  Patient Details Name: Randall [name] MRN: [id number] DOB: 07-07-1951   Cancelled Treatment:    Reason Eval/Treat Not Completed: Other (comment).  Pt laying on end (bottom) of bed upon therapy arrival with nurse present.  Attempted to encourage pt to participate in therapy but pt appearing agitated (pt wanting to go home) and unable to get pt to participate in therapy.  Utilized 3 assist and use of bed pad to scoot pt up in bed and reposition for safety.  Nurse present with pt upon therapist leaving room.  Leitha Bleak, PT 05/28/22, 2:44 PM

## 2022-05-28 NOTE — Progress Notes (Signed)
Mobility Specialist - Progress Note   05/28/22 1600  Mobility  Activity Ambulated with assistance in room;Dangled on edge of bed  Level of Assistance Moderate assist, patient does 50-74%  Assistive Device Front wheel walker  Distance Ambulated (ft) 6 ft  Activity Response Tolerated well  $Mobility charge 1 Mobility     Pt lying in bed upon arrival, utilizing RA. Pt agreeable to session. Able to come EOB with maxA this date; R lateral lean in sitting. STS with maxA +1; vc for hand placement and anterior weight shifting. Flexed trunk in standing that pt was unable to correct this date without heavy tactile cueing. Pt ambulated fwd/retro with modA +2 and B knees in flexed position. Short, shuffled steps. Pt very cooperative and appreciative of session this afternoon "thank you ladies for helping me". Pt returned to bed with maxA; alarm set, needs in reach, spouse at bedside.    Kathee Delton Mobility Specialist 05/28/22, 4:31 PM

## 2022-05-28 NOTE — Care Management Important Message (Signed)
Important Message  Patient Details  Name: Randall [name] MRN: [id number] Date of Birth: 1951/06/14   Medicare Important Message Given:  Yes     Juliann Pulse A Kratos Ruscitti 05/28/2022, 3:18 PM

## 2022-05-28 NOTE — Progress Notes (Signed)
Progress Note   Patient: Randall [name] TKP:[id number] DOB: [birth year] DOA: 05/17/2022     11 DOS: the patient was seen and examined on 05/28/2022   Brief hospital course: Randall [name] is a 71 y.o. male with medical history significant of hypertension, hyperlipidemia, diabetes mellitus type 2, Parkinson's disease with dementia presents after having a fall at home.  Patient was on the floor for about 12 hours.  By time he arrived in the hospital, he was found to have significant rhabdomyolysis with CK level of 8278.  Renal function still normal.  He was placed on IV fluids. Patient CK level was normalized on 9/12.  However, patient developed some confusion as well as dysphagia.  PT/OT recommended nursing home placement.    On 05/24/2022 urine very dark.  Patient given a fluid bolus and urine cleared up a little bit.  No blood in the urine.  On 05/25/2022.  Patient was having facial tremor and difficulty eating and taking meds.  05/26/2022 through 05/28/2022.  The patient has not been eating very much.  His urine is dark.  Bilirubin slightly high.  Previous urine analysis did not show any blood in the urine.  The patient wants to get out of the hospital.  On 05/28/2022 and his wife chose a bed at [facility] and insurance authorization is undergoing.  Assessment and Plan: * Failure to thrive in adult Patient having difficulty eating with Parkinson's and tremors.  Patient on a dysphagia 3 diet with thin liquids.  Appreciate palliative and speech therapy following.  Patient is a DNR.  Patient not eating very much and urine is very dark.  I fed the patient 3 bites of Magic cup this morning and 2 sips of Ensure.  Palliative care can follow at facility.  Rhabdomyolysis Traumatic rhabdomyolysis secondary to fall.  CPK 4735 on presentation down to 42.    Acute delirium Answers questions appropriately.  Continue Seroquel at night.  Patient wants to get out of the hospital.  Patient's wife has she has a  bed and insurance authorization was started  Parkinson's disease ([place]) Continue Sinemet and Requip 5 times a day  Hyponatremia Discontinue salt tablets.  Sodium up to 138.  Likely SIADH.  Type 2 diabetes mellitus with hyperlipidemia ([place]) Hold Mevacor with rhabdomyolysis.  Last hemoglobin A1c 5.5.  Transaminitis AST and ALT normal range.  Total bilirubin up at 1.9 and indirect bilirubin up at 1.6.  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  Urine did not show any blood or bilirubin.  Hypokalemia Replaced  Weakness Physical therapy recommending rehab.  Constipation Abdominal x-ray does not show constipation.  Did have a small bowel movement.  Likely not eating enough.  Dementia with behavioral disturbance ([facility]) Continue seroquel at night and exelon.  Haldol is not a good medication for parkinsons patients.        Subjective: Patient wants to get out of the hospital.  Refused breakfast and medication with nursing staff but I was able to feed him a few bites.  Physical Exam: Vitals:   05/27/22 1653 05/27/22 2113 05/28/22 0531 05/28/22 0845  BP: (!) 161/83 137/82 (!) 158/87 (!) 148/63  Pulse: 85 79 (!) 57 62  Resp: '18 18 18 16  '$ Temp: 98.9 F (37.2 C) 97.7 F (36.5 C) 98.3 F (36.8 C) 98.4 F (36.9 C)  TempSrc:  Oral  Oral  SpO2:  96% 100% 98%  Weight:      Height:  Physical Exam HENT:     Head: Normocephalic.     Mouth/Throat:     Pharynx: No oropharyngeal exudate.  Eyes:     General: Lids are normal.     Conjunctiva/sclera: Conjunctivae normal.  Cardiovascular:     Rate and Rhythm: Normal rate and regular rhythm.     Heart sounds: Normal heart sounds, S1 normal and S2 normal.  Pulmonary:     Breath sounds: No decreased breath sounds, wheezing, rhonchi or rales.  Abdominal:     Palpations: Abdomen is soft.     Tenderness: There is no abdominal tenderness.  Musculoskeletal:     Right lower leg: No swelling.     Left lower leg: No swelling.   Skin:    General: Skin is warm.     Findings: No rash.  Neurological:     Mental Status: He is alert.     Comments: Answers questions appropriately.  Resting tremor right arm.     Data Reviewed: Indirect bilirubin up at 1.6, total bilirubin 1.9.  Family Communication: Spoke with wife on the phone  Disposition: Status is: Inpatient Remains inpatient appropriate because: Patient's wife family chose a rehab bed and insurance authorization started.  Planned Discharge Destination: Rehab    Time spent: 28 minutes  Author: Loletha Grayer, MD 05/28/2022 2:09 PM  For on call review [email or web address].

## 2022-05-28 NOTE — Progress Notes (Signed)
OT Cancellation Note  Patient Details Name: KORY RAINS MRN: [id number] DOB: December 24, 1950   Cancelled Treatment:    Reason Eval/Treat Not Completed: Other (comment). Per conversation with RN, " Pt had a rough night" and is now sleeping. OT to re-attempt when pt is able to participate.   Darleen Crocker, MS, OTR/L , CBIS ascom [phone]  05/28/22, 8:53 AM

## 2022-05-28 NOTE — Progress Notes (Signed)
Attempted to give patient afternoon medications, patient spit meds back out.

## 2022-05-28 NOTE — TOC Progression Note (Signed)
Transition of Care [facility]) - Progression Note    Patient Details  Name: Randall [name] MRN: [id number] Date of Birth: 09/23/1950  [facility]) CM/SW Contact  Laurena Slimmer, RN Phone Number: 05/28/2022, 12:52 PM  Clinical Narrative:    Attempt to reach patient's wife at bedside to discuss discharge plan and choice of bed offer. Patient's wife not at bedside. Attempt to reach patient's wife by phone. No answer.    12:50pm Spoke with patient's wife. Patient wife stated she needed more time to pray about bed offer. Advised wife she would have to make a decision about a bed offer today. Presented patient with bed offers for [facility], [facility], [place] , and [facility]. She is agreeable to bed offer for [facility].   [facility] contacted to begin authorization for  [facility] and EMS transport. Authorization likely tomorrow per Tammy at [employer].    Expected Discharge Plan: [place] Barriers to Discharge: Continued Medical Work up  Expected Discharge Plan and Services Expected Discharge Plan: [place] Choice: [place] arrangements for the past 2 months: Single Family Home                                       Social Determinants of Health (SDOH) Interventions Utilities Interventions: Intervention Not Indicated  Readmission Risk Interventions     No data to display

## 2022-05-29 DIAGNOSIS — R627 Adult failure to thrive: Secondary | ICD-10-CM | POA: Diagnosis not present

## 2022-05-29 LAB — CBC
HCT: 40.1 % (ref 39.0–52.0)
Hemoglobin: 13 g/dL (ref 13.0–17.0)
MCH: 30.2 pg (ref 26.0–34.0)
MCHC: 32.4 g/dL (ref 30.0–36.0)
MCV: 93 fL (ref 80.0–100.0)
Platelets: 346 10*3/uL (ref 150–400)
RBC: 4.31 MIL/uL (ref 4.22–5.81)
RDW: 14.1 % (ref 11.5–15.5)
WBC: 9.1 10*3/uL (ref 4.0–10.5)
nRBC: 0 % (ref 0.0–0.2)

## 2022-05-29 LAB — COMPREHENSIVE METABOLIC PANEL
ALT: 9 U/L (ref 0–44)
AST: 14 U/L — ABNORMAL LOW (ref 15–41)
Albumin: 3.7 g/dL (ref 3.5–5.0)
Alkaline Phosphatase: 68 U/L (ref 38–126)
Anion gap: 10 (ref 5–15)
BUN: 25 mg/dL — ABNORMAL HIGH (ref 8–23)
CO2: 29 mmol/L (ref 22–32)
Calcium: 9.4 mg/dL (ref 8.9–10.3)
Chloride: 102 mmol/L (ref 98–111)
Creatinine, Ser: 0.85 mg/dL (ref 0.61–1.24)
GFR, Estimated: >60 mL/min (ref >60)
Glucose, Bld: 177 mg/dL — ABNORMAL HIGH (ref 70–99)
Potassium: 3.2 mmol/L — ABNORMAL LOW (ref 3.5–5.1)
Sodium: 141 mmol/L (ref 135–145)
Total Bilirubin: 1.7 mg/dL — ABNORMAL HIGH (ref 0.3–1.2)
Total Protein: 6.6 g/dL (ref 6.5–8.1)

## 2022-05-29 MED ORDER — ENSURE ENLIVE PO LIQD
237.0000 mL | Freq: Three times a day (TID) | ORAL | Status: DC
  Administered 2022-05-29 – 2022-06-18 (×50): 237 mL via ORAL

## 2022-05-29 NOTE — Progress Notes (Signed)
Nutrition Follow-up  DOCUMENTATION CODES:   Not applicable  INTERVENTION:   -Continue Magic cup TID with meals, each supplement provides 290 kcal and 9 grams of protein  -Continue MVI with minerals daily -Continue dysphagia 3 diet  -D/c Ensure Max -Ensure Enlive po TID, each supplement provides 350 kcal and 20 grams of protein  NUTRITION DIAGNOSIS:   Inadequate oral intake related to acute illness as evidenced by per patient/family report.  Ongoing  GOAL:   Patient will meet greater than or equal to 90% of their needs  Unmet  MONITOR:   PO intake, Supplement acceptance, Labs, Weight trends, Skin, I & O's  REASON FOR ASSESSMENT:   Malnutrition Screening Tool    ASSESSMENT:   71 y.o. male with medical history significant of hypertension, hyperlipidemia, OSA, depression, diabetes mellitus type 2 and Parkinson's disease with dementia who is admitted with traumatic rhabdomyolysis secondary to fall.  9/12- s/p BSE- recommending dysphagia 3 diet  Reviewed I/O's: -80 ml x 24 hours and +861 ml since admission  UOP: 500 ml x 24 hours   Pt lying in bed at time of visit. Did not disturb. Pt with very poor oral intake. Noted meal completions 10-30%. Palliative care following for goals of care; pt does not desire feeding tube.   Per TOC, SNF has been denied due to decline and no skilled need. Peer to peer has been requested.   Medications reviewed and include sinemet and lactulose.   Labs reviewed: K: 3.2, CBGS: 164-217 (inpatient orders for glycemic control are none).    Diet Order:   Diet Order             DIET DYS 3 Room service appropriate? Yes; Fluid consistency: Thin  Diet effective now                   EDUCATION NEEDS:   Education needs have been addressed  Skin:  Skin Assessment: Reviewed RN Assessment  Last BM:  05/27/22  Height:   Ht Readings from Last 1 Encounters:  05/17/22 '5\' 6"'$  (1.676 m)    Weight:   Wt Readings from Last 1 Encounters:   05/21/22 76.7 kg    Ideal Body Weight:  59 kg  BMI:  Body mass index is 27.29 kg/m.  Estimated Nutritional Needs:   Kcal:  1800-2100kcal/day  Protein:  90-105g/day  Fluid:  1.7-2.9L/day    Loistine Chance, RD, LDN, [place] Registered Dietitian II Certified Diabetes Care and Education Specialist Please refer to [facility] for RD and/or RD on-call/weekend/after hours pager

## 2022-05-29 NOTE — Progress Notes (Signed)
Occupational Therapy Treatment Patient Details Name: Randall [name] MRN: [id number] DOB: [birth year] Today's Date: 05/29/2022   History of present illness Randall [name] is a 71 year old male admitted after being found down on floor for 12 hours, admitted with significant rhabdomyolysis; PMH significant for  hypertension, hyperlipidemia, diabetes mellitus type 2, Parkinson's disease with dementia. Randall [name] required haldol on 05/19/21 2/2 agitation/sundowning.   OT comments  Randall [name] was seen for OT treatment on this date. RN cleared Randall [name] for participation in therapy session. Upon arrival to room Randall [name] awake/alert, wife present at bedside. Randall [name] eager to get OOB and agreeable to tx session. Randall [name] endorses remembering this author from past session. Randall [name] is pleasant and motivated t/o session. He is oriented to self, place, and limited situation. He continues to require consistent cueing for safety t/o session. OT facilitated ADL tasks as described below. Randall [name] requires MOD/MAX A for bed mobility and functional mobility during session. He performs seated UB grooming with set up assist and UB dressing with MIN A. Randall [name] making good progress toward goals and continues to benefit from skilled OT services to maximize return to PLOF and minimize risk of future falls, injury, caregiver burden, and readmission. Will continue to follow POC. Discharge recommendation remains appropriate.     Recommendations for follow up therapy are one component of a multi-disciplinary discharge planning process, led by the attending physician.  Recommendations may be updated based on patient status, additional functional criteria and insurance authorization.    Follow Up Recommendations  Skilled nursing-short term rehab (<3 hours/day)    Assistance Recommended at Discharge Frequent or constant Supervision/Assistance  Patient can return home with the following  Assistance with cooking/housework;Direct supervision/assist for medications management;A lot of help with  walking and/or transfers;A lot of help with bathing/dressing/bathroom;Direct supervision/assist for financial management;Assist for transportation;Help with stairs or ramp for entrance   Equipment Recommendations  Other (comment) (Defer to next venue of care.)    Recommendations for Other Services      Precautions / Restrictions Precautions Precautions: Fall Precaution Comments: Aspiration Restrictions Weight Bearing Restrictions: No       Mobility Bed Mobility Overal bed mobility: Needs Assistance Bed Mobility: Supine to Sit     Supine to sit: Mod assist, HOB elevated     General bed mobility comments: vc's for technique    Transfers Overall transfer level: Needs assistance Equipment used: Rolling walker (2 wheels) Transfers: Sit to/from Stand Sit to Stand: Min assist, Mod assist, +2 safety/equipment     Step pivot transfers: Max assist, Mod assist     General transfer comment: vc's for UE/LE placement (x2 trials); assist to initiate stand up to RW and control descent sitting. Able to progress to MOD A for step pivot to recliner.     Balance Overall balance assessment: Needs assistance Sitting-balance support: Bilateral upper extremity supported, Feet supported Sitting balance-Leahy Scale: Fair Sitting balance - Comments: Intermittent tactlie cueint to maintain upright position 2/2 posterior lean. Postural control: Posterior lean Standing balance support: Bilateral upper extremity supported, Reliant on assistive device for balance, During functional activity Standing balance-Leahy Scale: Poor                             ADL either performed or assessed with clinical judgement   ADL Overall ADL's : Needs assistance/impaired Eating/Feeding: Set up;Sitting   Grooming: Wash/dry face;Set up;Supervision/safety;Sitting           Upper Body Dressing : Cueing for  sequencing;Minimal assistance;Sitting                   Functional mobility  during ADLs: Moderate assistance;Rolling walker (2 wheels);Maximal assistance;Cueing for safety;Cueing for sequencing      Extremity/Trunk Assessment Upper Extremity Assessment Upper Extremity Assessment: Generalized weakness   Lower Extremity Assessment Lower Extremity Assessment: Generalized weakness   Cervical / Trunk Assessment Cervical / Trunk Assessment: Kyphotic    Vision Baseline Vision/History: 1 Wears glasses Patient Visual Report: No change from baseline     Perception     Praxis      Cognition Arousal/Alertness: Awake/alert Behavior During Therapy: WFL for tasks assessed/performed Overall Cognitive Status: History of cognitive impairments - at baseline Area of Impairment: Orientation, Attention, Following commands, Safety/judgement                 Orientation Level: Disoriented to, Situation Current Attention Level: Sustained   Following Commands: Follows one step commands with increased time Safety/Judgement: Decreased awareness of safety, Decreased awareness of deficits Awareness: Emergent Problem Solving: Slow processing, Decreased initiation, Difficulty sequencing, Requires verbal cues, Requires tactile cues General Comments: Follows VCs with increased time/cueing. Requires multimodal cueing to initiate functional mobility.        Exercises Other Exercises Other Exercises: edu Randall [name] and wife re: role of OT, role of rehab, discharge recommendations, home safety, home set up, DME use, falls prevention. OT facilitated chair transfer, UB dressing, and UB grooming    Shoulder Instructions       General Comments      Pertinent Vitals/ Pain       Pain Assessment Pain Assessment: No/denies pain  Home Living                                          Prior Functioning/Environment              Frequency  Min 2X/week        Progress Toward Goals  OT Goals(current goals can now be found in the care plan section)  Progress  towards OT goals: Progressing toward goals  Acute Rehab OT Goals Patient Stated Goal: To go home with my wife. OT Goal Formulation: With patient Time For Goal Achievement: 06/02/22 Potential to Achieve Goals: Good  Plan Frequency remains appropriate;Discharge plan remains appropriate    Co-evaluation                 AM-PAC OT "6 Clicks" Daily Activity     Outcome Measure   Help from another person eating meals?: A Little Help from another person taking care of personal grooming?: A Little Help from another person toileting, which includes using toliet, bedpan, or urinal?: A Lot Help from another person bathing (including washing, rinsing, drying)?: A Lot Help from another person to put on and taking off regular upper body clothing?: A Little Help from another person to put on and taking off regular lower body clothing?: A Lot 6 Click Score: 15    End of Session Equipment Utilized During Treatment: Gait belt;Rolling walker (2 wheels)  OT Visit Diagnosis: Unsteadiness on feet (R26.81);Other abnormalities of gait and mobility (R26.89);History of falling (Z91.81)   Activity Tolerance Patient tolerated treatment well;Patient limited by fatigue   Patient Left with call bell/phone within reach;in chair;with chair alarm set;with nursing/sitter in room   Nurse Communication Mobility status        Time:  7902-4097 OT Time Calculation (min): 25 min  Charges: OT General Charges $OT Visit: 1 Visit OT Treatments $Self Care/Home Management : 23-37 mins  Shara Blazing, M.S., OTR/L Ascom: [phone] 05/29/22, 10:56 AM

## 2022-05-29 NOTE — Progress Notes (Signed)
Physical Therapy Treatment Patient Details Name: Randall [name] MRN: [id number] DOB: 12-Apr-1951 Today's Date: 05/29/2022   History of Present Illness Pt is a 71 year old male admitted after being found down on floor for 12 hours, admitted with significant rhabdomyolysis; PMH significant for  hypertension, hyperlipidemia, diabetes mellitus type 2, Parkinson's disease with dementia. Pt required haldol on 05/19/21 2/2 agitation/sundowning.    PT Comments    Pt is making gradual progress towards goals with ability to ambulate short distance in room using RW. Min assist, however frequent LOB during turns needing mod assist to prevent falls. Follows commands well and able to participate in there-ex in seated position and standing. Pt in recliner and encouraged to maintain OOB mobility as much as possible. Will continue to progress.   Recommendations for follow up therapy are one component of a multi-disciplinary discharge planning process, led by the attending physician.  Recommendations may be updated based on patient status, additional functional criteria and insurance authorization.  Follow Up Recommendations  Skilled nursing-short term rehab (<3 hours/day) Can patient physically be transported by private vehicle: Yes   Assistance Recommended at Discharge Frequent or constant Supervision/Assistance  Patient can return home with the following Assistance with cooking/housework;Assist for transportation;Help with stairs or ramp for entrance;Direct supervision/assist for financial management;A lot of help with walking and/or transfers;A lot of help with bathing/dressing/bathroom   Equipment Recommendations  Rolling walker (2 wheels);BSC/3in1;Wheelchair (measurements PT);Wheelchair cushion (measurements PT)    Recommendations for Other Services       Precautions / Restrictions Precautions Precautions: Fall Precaution Comments: Aspiration Restrictions Weight Bearing Restrictions: No      Mobility  Bed Mobility               General bed mobility comments: NT, received in chair    Transfers Overall transfer level: Needs assistance Equipment used: Rolling walker (2 wheels) Transfers: Sit to/from Stand Sit to Stand: Min assist           General transfer comment: upright posture with RW. Cues for upright posture with tend for post lean    Ambulation/Gait Ambulation/Gait assistance: Min assist Gait Distance (Feet): 10 Feet Assistive device: Rolling walker (2 wheels) Gait Pattern/deviations: Step-through pattern       General Gait Details: festinating gait with increased LOB during turns. RW used with mod assist for recovery. Cues for sequencing RW   Stairs             Wheelchair Mobility    Modified Rankin (Stroke Patients Only)       Balance Overall balance assessment: Needs assistance Sitting-balance support: Bilateral upper extremity supported, Feet supported Sitting balance-Leahy Scale: Good     Standing balance support: Bilateral upper extremity supported, Reliant on assistive device for balance, During functional activity Standing balance-Leahy Scale: Poor                              Cognition Arousal/Alertness: Awake/alert Behavior During Therapy: WFL for tasks assessed/performed Overall Cognitive Status: History of cognitive impairments - at baseline                                 General Comments: follows commands well, difficulty with expressions. Pleasant        Exercises Other Exercises Other Exercises: standing mini squats, LAQ, hip ab/add, SLRs, and SAQ on B LE. 10 reps with supervision  General Comments        Pertinent Vitals/Pain Pain Assessment Pain Assessment: No/denies pain    Home Living                          Prior Function            PT Goals (current goals can now be found in the care plan section) Acute Rehab PT Goals Patient Stated Goal: rehab  then home PT Goal Formulation: With patient/family Time For Goal Achievement: 06/02/22 Potential to Achieve Goals: Good Progress towards PT goals: Progressing toward goals    Frequency    Min 2X/week      PT Plan Current plan remains appropriate    Co-evaluation              AM-PAC PT "6 Clicks" Mobility   Outcome Measure  Help needed turning from your back to your side while in a flat bed without using bedrails?: A Little Help needed moving from lying on your back to sitting on the side of a flat bed without using bedrails?: A Little Help needed moving to and from a bed to a chair (including a wheelchair)?: A Little Help needed standing up from a chair using your arms (e.g., wheelchair or bedside chair)?: A Little Help needed to walk in hospital room?: A Lot Help needed climbing 3-5 steps with a railing? : Total 6 Click Score: 15    End of Session Equipment Utilized During Treatment: Gait belt Activity Tolerance: Patient tolerated treatment well Patient left: in chair;with chair alarm set;with family/visitor present Nurse Communication: Mobility status;Precautions PT Visit Diagnosis: Unsteadiness on feet (R26.81);History of falling (Z91.81);Muscle weakness (generalized) (M62.81);Other abnormalities of gait and mobility (R26.89)     Time: 4818-5631 PT Time Calculation (min) (ACUTE ONLY): 17 min  Charges:  $Gait Training: 8-22 mins                     Randall [name], PT, DPT, GCS [phone]    Randall [name] 05/29/2022, 11:38 AM

## 2022-05-29 NOTE — Plan of Care (Signed)

## 2022-05-29 NOTE — Progress Notes (Addendum)
PROGRESS NOTE    Randall [name]  WUJ:[id number] DOB: [birth year] DOA: 05/17/2022 PCP: Adin Hector, MD    Brief Narrative:  71 y.o. male with medical history significant of hypertension, hyperlipidemia, diabetes mellitus type 2, Parkinson's disease with dementia presents after having a fall at home.  Patient was on the floor for about 12 hours.  By time he arrived in the hospital, he was found to have significant rhabdomyolysis with CK level of 8278.  Renal function still normal.  He was placed on IV fluids. Patient CK level was normalized on 9/12.  However, patient developed some confusion as well as dysphagia.  PT/OT recommended nursing home placement.     On 05/24/2022 urine very dark.  Patient given a fluid bolus and urine cleared up a little bit.  No blood in the urine.   On 05/25/2022.  Patient was having facial tremor and difficulty eating and taking meds.   05/26/2022 through 05/28/2022.  The patient has not been eating very much.  His urine is dark.  Bilirubin slightly high.  Previous urine analysis did not show any blood in the urine.  The patient wants to get out of the hospital.  On 05/28/2022 and his wife chose a bed at [facility] and insurance authorization is undergoing.  9/20: Received notification from [facility] that patient's insurance carrier has declined skilled nursing facility authorization.  Performed peer to peer with medical director at [facility] Dr. Coralie Carpen at [phone].  At this time denial is upheld.   Assessment & Plan:   Principal Problem:   Failure to thrive in adult Active Problems:   Rhabdomyolysis   Acute delirium   Parkinson's disease ([place])   Fall at home, initial encounter   Leukocytosis   Hyponatremia   Type 2 diabetes mellitus with hyperlipidemia (HCC)   Transaminitis   Hyperbilirubinemia   Dysphagia   Dementia with behavioral disturbance (HCC)   Constipation   Weakness   Hypokalemia  * Failure to thrive in adult Patient having  difficulty eating with Parkinson's and tremors.  Patient on a dysphagia 3 diet with thin liquids.  Appreciate palliative and speech therapy following.  Patient is a DNR.  Patient not eating very much and urine is very dark.  Attempted feed is much as tolerated but feel this is more chronic and functional decline.  Attempted to secure placement at skilled nursing facility however insurance carrier has declined citing custodial care and chronic issues.  Attempted to reach the patient's wife to discuss.  No answer, voicemail left.  TOC to follow-up   Rhabdomyolysis, resolved Traumatic rhabdomyolysis secondary to fall.  CPK 4735 on presentation down to 42.     Acute delirium Answers questions appropriately.  Continue Seroquel at night.  Patient wants to get out of the hospital.  Patient's wife would like him placed in a skilled nursing facility however at this time insurance carrier has declined   Parkinson's disease ([place]) Continue Sinemet and Requip 5 times a day   Hyponatremia Now resolved.  Sodium in reference range.  Suspect SIADH   Type 2 diabetes mellitus with hyperlipidemia ([place]) Hold Mevacor with rhabdomyolysis.  Last hemoglobin A1c 5.5.   Transaminitis AST and ALT normal range.  Total bilirubin up at 1.9 and indirect bilirubin up at 1.6.  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  Urine did not show any blood or bilirubin.   Hypokalemia Replaced   Weakness Physical therapy recommending rehab.   Constipation Abdominal x-ray does not  show constipation.  Did have a small bowel movement.  Likely not eating enough.   Dementia with behavioral disturbance (HCC) Continue seroquel at night and exelon.  Haldol is not a good medication for parkinsons patients.   DVT prophylaxis: SQ Lovenox Code Status: DNR Family Communication: wife Jeanluc Wegman [phone] on 9/20. Disposition Plan: Status is: Inpatient Remains inpatient appropriate because: Unsafe discharge plan.  Insurance  carrier has denied placement in skilled nursing facility.  Attempting to coordinate with TOC and patient's family to determine appropriate disposition plan.   Level of care: Med-Surg  Consultants:  Palliative care  Procedures:  None none  Antimicrobials: None   Subjective: Patient seen and examined.  Appears to be at baseline level of mentation.  Difficult to understand due to underlying Parkinson's.  Objective: Vitals:   05/28/22 1659 05/28/22 2100 05/29/22 0500 05/29/22 0921  BP: (!) 158/84 (!) 142/75 (!) 147/80 109/66  Pulse: 95 85 70 88  Resp: '16 18 16 18  '$ Temp: 99.2 F (37.3 C) 98.4 F (36.9 C) 98.2 F (36.8 C) 97.8 F (36.6 C)  TempSrc:  Oral Axillary   SpO2: 99% 98% 98% 99%  Weight:      Height:        Intake/Output Summary (Last 24 hours) at 05/29/2022 1306 Last data filed at 05/29/2022 0840 Gross per 24 hour  Intake 707 ml  Output 500 ml  Net 207 ml   Filed Weights   05/17/22 1649 05/20/22 1527 05/21/22 0631  Weight: 67.1 kg 79 kg 76.7 kg    Examination:  General exam: NAD.  Muffled speech.  Tremulous Respiratory system: Poor respiratory effort.  Lungs clear.  Normal work of breathing.  Room air Cardiovascular system: Distant heart sounds.  No appreciable murmurs.  No LE edema Gastrointestinal system: Soft thin, NT/ND, normal bowel sounds Central nervous system: Alert.  Oriented x2.  No focal deficits Extremities: Decreased power symmetrically.  Diffuse tremor Skin: No rashes, lesions or ulcers Psychiatry: Judgement and insight appear impaired. Mood & affect flattened.     Data Reviewed: I have personally reviewed following labs and imaging studies  CBC: Recent Labs  Lab 05/24/22 0520 05/26/22 0613 05/29/22 0437  WBC  --  6.8 9.1  HGB 13.0 12.7* 13.0  HCT  --  40.1 40.1  MCV  --  92.2 93.0  PLT  --  370 638   Basic Metabolic Panel: Recent Labs  Lab 05/24/22 0520 05/26/22 0613 05/27/22 0718 05/29/22 0437  NA 137 137 138 141  K  3.8 3.4* 3.8 3.2*  CL 100 101 103 102  CO2 '28 25 28 29  '$ GLUCOSE 180* 167* 177* 177*  BUN '23 22 23 '$ 25*  CREATININE 0.85 0.70 0.68 0.85  CALCIUM 9.2 9.2 9.1 9.4   GFR: Estimated Creatinine Clearance: 76.7 mL/min (by C-G formula based on SCr of 0.85 mg/dL). Liver Function Tests: Recent Labs  Lab 05/27/22 0719 05/29/22 0437  AST 25 14*  ALT 6 9  ALKPHOS 69 68  BILITOT 1.9* 1.7*  PROT 6.3* 6.6  ALBUMIN 3.6 3.7   No results for input(s): "LIPASE", "AMYLASE" in the last 168 hours. No results for input(s): "AMMONIA" in the last 168 hours. Coagulation Profile: No results for input(s): "INR", "PROTIME" in the last 168 hours. Cardiac Enzymes: Recent Labs  Lab 05/27/22 0718  CKTOTAL 42*   BNP (last 3 results) No results for input(s): "PROBNP" in the last 8760 hours. HbA1C: No results for input(s): "HGBA1C" in the last 72 hours. CBG:  Recent Labs  Lab 05/25/22 1839 05/25/22 2130 05/26/22 0850 05/26/22 1158 05/26/22 1615  GLUCAP 220* 173* 173* 164* 217*   Lipid Profile: No results for input(s): "CHOL", "HDL", "LDLCALC", "TRIG", "CHOLHDL", "LDLDIRECT" in the last 72 hours. Thyroid Function Tests: No results for input(s): "TSH", "T4TOTAL", "FREET4", "T3FREE", "THYROIDAB" in the last 72 hours. Anemia Panel: No results for input(s): "VITAMINB12", "FOLATE", "FERRITIN", "TIBC", "IRON", "RETICCTPCT" in the last 72 hours. Sepsis Labs: No results for input(s): "PROCALCITON", "LATICACIDVEN" in the last 168 hours.  No results found for this or any previous visit (from the past 240 hour(s)).       Radiology Studies: No results found.      Scheduled Meds:  carbidopa-levodopa  2 tablet Oral 5 X Daily   enoxaparin (LOVENOX) injection  40 mg Subcutaneous Q24H   feeding supplement  237 mL Oral TID BM   gabapentin  300 mg Oral TID   lactulose  30 g Oral Daily   multivitamin with minerals  1 tablet Oral Daily   QUEtiapine  75 mg Oral QHS   rivastigmine  1.5 mg Oral BID    rOPINIRole  2 mg Oral 5 X Daily   sodium chloride flush  3 mL Intravenous Q12H   Continuous Infusions:   LOS: 12 days      Sidney Ace, MD [facility]   If 7PM-7AM, please contact night-coverage  05/29/2022, 1:06 PM

## 2022-05-29 NOTE — Progress Notes (Addendum)
Phone call retrieved from [facility]. Spoke with reviewer Tammy @  [phone]. Patient was denied SNF approval after MD review based on no skilled need, gradual decline and family refusal for feeding tube for chronic aspiration. MD advised peer to peer could be requested. Provided information to call Dr. Lynder Parents @ [phone] by end of business day today.   11:17am Called left message for patient's spouse.   11:30am Spoke with patient's wife at bedside to advise authorization was not approved. Advised of right to have peer to peer. Patient and wife requesting to speak with MD. She sated she is not able to care for him due to recent events surround her knee fracture while in the ER with patient. Advised ALF may be an option.  3:20pm Called wife to discuss ALF. Wife agreeable to [facility] [employer] referral.   3:25pm Contacted Gwendolyn Grant at [facility] to make referral.

## 2022-05-30 DIAGNOSIS — R627 Adult failure to thrive: Secondary | ICD-10-CM | POA: Diagnosis not present

## 2022-05-30 LAB — BASIC METABOLIC PANEL
Anion gap: 9 (ref 5–15)
BUN: 21 mg/dL (ref 8–23)
CO2: 29 mmol/L (ref 22–32)
Calcium: 9.4 mg/dL (ref 8.9–10.3)
Chloride: 99 mmol/L (ref 98–111)
Creatinine, Ser: 0.79 mg/dL (ref 0.61–1.24)
GFR, Estimated: >60 mL/min (ref >60)
Glucose, Bld: 339 mg/dL — ABNORMAL HIGH (ref 70–99)
Potassium: 3.7 mmol/L (ref 3.5–5.1)
Sodium: 137 mmol/L (ref 135–145)

## 2022-05-30 LAB — GLUCOSE, CAPILLARY
Glucose-Capillary: 322 mg/dL — ABNORMAL HIGH (ref 70–99)
Glucose-Capillary: 234 mg/dL — ABNORMAL HIGH (ref 70–99)

## 2022-05-30 MED ORDER — INSULIN ASPART 100 UNIT/ML IJ SOLN
0.0000 [IU] | Freq: Three times a day (TID) | INTRAMUSCULAR | Status: DC
  Administered 2022-05-30 – 2022-05-31 (×2): 1 [IU] via SUBCUTANEOUS
  Administered 2022-05-31 (×3): 3 [IU] via SUBCUTANEOUS
  Administered 2022-06-01: 7 [IU] via SUBCUTANEOUS
  Administered 2022-06-01 (×3): 3 [IU] via SUBCUTANEOUS
  Administered 2022-06-02 (×2): 2 [IU] via SUBCUTANEOUS
  Administered 2022-06-02: 7 [IU] via SUBCUTANEOUS
  Administered 2022-06-02 – 2022-06-03 (×3): 2 [IU] via SUBCUTANEOUS
  Administered 2022-06-03: 7 [IU] via SUBCUTANEOUS
  Administered 2022-06-03 – 2022-06-04 (×3): 3 [IU] via SUBCUTANEOUS
  Administered 2022-06-04: 2 [IU] via SUBCUTANEOUS
  Administered 2022-06-04 – 2022-06-05 (×3): 3 [IU] via SUBCUTANEOUS
  Administered 2022-06-05: 5 [IU] via SUBCUTANEOUS
  Administered 2022-06-05: 3 [IU] via SUBCUTANEOUS
  Administered 2022-06-06: 2 [IU] via SUBCUTANEOUS
  Administered 2022-06-06 (×2): 5 [IU] via SUBCUTANEOUS
  Administered 2022-06-06 – 2022-06-07 (×4): 3 [IU] via SUBCUTANEOUS
  Administered 2022-06-07 – 2022-06-08 (×2): 5 [IU] via SUBCUTANEOUS
  Administered 2022-06-08 (×2): 3 [IU] via SUBCUTANEOUS
  Administered 2022-06-09 (×2): 2 [IU] via SUBCUTANEOUS
  Administered 2022-06-09: 3 [IU] via SUBCUTANEOUS
  Administered 2022-06-09: 2 [IU] via SUBCUTANEOUS
  Administered 2022-06-10: 1 [IU] via SUBCUTANEOUS
  Administered 2022-06-10 (×2): 2 [IU] via SUBCUTANEOUS
  Administered 2022-06-10 – 2022-06-11 (×2): 3 [IU] via SUBCUTANEOUS
  Administered 2022-06-11: 2 [IU] via SUBCUTANEOUS
  Administered 2022-06-11: 3 [IU] via SUBCUTANEOUS
  Administered 2022-06-11 – 2022-06-12 (×2): 2 [IU] via SUBCUTANEOUS
  Administered 2022-06-12: 1 [IU] via SUBCUTANEOUS
  Administered 2022-06-12: 3 [IU] via SUBCUTANEOUS
  Administered 2022-06-13 (×2): 2 [IU] via SUBCUTANEOUS
  Administered 2022-06-13: 3 [IU] via SUBCUTANEOUS
  Administered 2022-06-14 (×2): 2 [IU] via SUBCUTANEOUS
  Administered 2022-06-14 – 2022-06-15 (×3): 1 [IU] via SUBCUTANEOUS
  Administered 2022-06-15 (×2): 2 [IU] via SUBCUTANEOUS
  Administered 2022-06-16: 1 [IU] via SUBCUTANEOUS
  Administered 2022-06-16: 3 [IU] via SUBCUTANEOUS
  Administered 2022-06-16: 1 [IU] via SUBCUTANEOUS
  Administered 2022-06-17: 2 [IU] via SUBCUTANEOUS
  Administered 2022-06-17 (×2): 1 [IU] via SUBCUTANEOUS
  Administered 2022-06-17 – 2022-06-18 (×3): 2 [IU] via SUBCUTANEOUS
  Administered 2022-06-18: 1 [IU] via SUBCUTANEOUS
  Filled 2022-05-30 (×70): qty 1

## 2022-05-30 MED ORDER — INSULIN ASPART 100 UNIT/ML IJ SOLN
0.0000 [IU] | Freq: Three times a day (TID) | INTRAMUSCULAR | Status: DC
  Administered 2022-05-30: 7 [IU] via SUBCUTANEOUS
  Filled 2022-05-30: qty 1

## 2022-05-30 MED ORDER — LACTULOSE 10 GM/15ML PO SOLN: 30.0000 g | Freq: Every day | ORAL | Status: DC | PRN

## 2022-05-30 NOTE — Progress Notes (Signed)
Occupational Therapy Treatment Patient Details Name: Randall [name] MRN: [id number] DOB: 1950-10-08 Today's Date: 05/30/2022   History of present illness Pt is a 71 year old male admitted after being found down on floor for 12 hours, admitted with significant rhabdomyolysis; PMH significant for  hypertension, hyperlipidemia, diabetes mellitus type 2, Parkinson's disease with dementia. Pt required haldol on 05/19/21 2/2 agitation/sundowning.   OT comments  Randall [name] was seen for OT treatment on this date. Upon arrival to room pt awake/alert, wife present at bedside. Pt noted with increased difficulty communicating this session, but is agreeable to participation and eager to "get cleaned up". Pt is pleasant and motivated t/o session. He is oriented to self and place. He continues to require consistent cueing for safety. OT facilitated ADL tasks as described below. Pt requires MOD/MAX A for functional mobility and MAX A for Standing grooming transition to SUPERVISION for seated grooming to maximize safety and independence during session. Pt making good progress toward goals and continues to benefit from skilled OT services to maximize return to PLOF and minimize risk of future falls, injury, caregiver burden, and readmission. Will continue to follow POC. Discharge recommendation remains appropriate.   Recommendations for follow up therapy are one component of a multi-disciplinary discharge planning process, led by the attending physician.  Recommendations may be updated based on patient status, additional functional criteria and insurance authorization.    Follow Up Recommendations  Skilled nursing-short term rehab (<3 hours/day)    Assistance Recommended at Discharge Frequent or constant Supervision/Assistance  Patient can return home with the following  Assistance with cooking/housework;Direct supervision/assist for medications management;A lot of help with walking and/or transfers;A lot of help with  bathing/dressing/bathroom;Direct supervision/assist for financial management;Assist for transportation;Help with stairs or ramp for entrance   Equipment Recommendations   (Defer)    Recommendations for Other Services      Precautions / Restrictions Precautions Precautions: Fall Precaution Comments: Aspiration Restrictions Weight Bearing Restrictions: No       Mobility Bed Mobility Overal bed mobility: Needs Assistance             General bed mobility comments: NT, received in chair    Transfers Overall transfer level: Needs assistance Equipment used: Rolling walker (2 wheels) Transfers: Sit to/from Stand Sit to Stand: Mod assist, Max assist     Step pivot transfers: Max assist, Mod assist     General transfer comment: Increased difficulty with functional transfers this date. Requires MOD-MAX A to maintain safe standing balance.     Balance Overall balance assessment: Needs assistance Sitting-balance support: Bilateral upper extremity supported, Feet supported Sitting balance-Leahy Scale: Good     Standing balance support: Bilateral upper extremity supported, Reliant on assistive device for balance, During functional activity Standing balance-Leahy Scale: Poor Standing balance comment: assist for balance with ambulation required                           ADL either performed or assessed with clinical judgement   ADL Overall ADL's : Needs assistance/impaired Eating/Feeding: Set up;Sitting   Grooming: Wash/dry face;Set up;Supervision/safety;Sitting Grooming Details (indicate cue type and reason): Attempted standing grooming, however pt unable to maintain safe standing balance during 2-handed tasks. Transitioned to sitting for improved safety and functional independence. x1 instance of posterior LOB. Pt requires MOD-MAX A to maintain safe standing balance during session.  Functional mobility during ADLs:  Moderate assistance;Rolling walker (2 wheels);Maximal assistance;Cueing for safety;Cueing for sequencing General ADL Comments: Variable assist required during functional mobility in room. Pt noted to move impulsively with poor safety awareness.    Extremity/Trunk Assessment              Vision Baseline Vision/History: 1 Wears glasses Patient Visual Report: No change from baseline     Perception     Praxis      Cognition Arousal/Alertness: Awake/alert Behavior During Therapy: WFL for tasks assessed/performed Overall Cognitive Status: History of cognitive impairments - at baseline Area of Impairment: Orientation, Attention, Following commands, Safety/judgement                 Orientation Level: Disoriented to, Situation Current Attention Level: Sustained   Following Commands: Follows one step commands inconsistently Safety/Judgement: Decreased awareness of safety, Decreased awareness of deficits Awareness: Emergent Problem Solving: Slow processing, Decreased initiation, Difficulty sequencing, Requires verbal cues, Requires tactile cues General Comments: Increased difficulty following VCs this date.        Exercises Other Exercises Other Exercises: OT facilitated UB grooming and functional mobility as described above. See ADL section for additional details.    Shoulder Instructions       General Comments      Pertinent Vitals/ Pain       Pain Assessment Pain Assessment: No/denies pain Faces Pain Scale: No hurt  Home Living                                          Prior Functioning/Environment              Frequency  Min 2X/week        Progress Toward Goals  OT Goals(current goals can now be found in the care plan section)  Progress towards OT goals: Progressing toward goals  Acute Rehab OT Goals Patient Stated Goal: To go home OT Goal Formulation: With patient Time For Goal Achievement: 06/02/22 Potential to Achieve  Goals: Good  Plan Frequency remains appropriate;Discharge plan remains appropriate    Co-evaluation                 AM-PAC OT "6 Clicks" Daily Activity     Outcome Measure   Help from another person eating meals?: A Little Help from another person taking care of personal grooming?: A Little Help from another person toileting, which includes using toliet, bedpan, or urinal?: A Lot Help from another person bathing (including washing, rinsing, drying)?: A Lot Help from another person to put on and taking off regular upper body clothing?: A Little Help from another person to put on and taking off regular lower body clothing?: A Lot 6 Click Score: 15    End of Session Equipment Utilized During Treatment: Gait belt;Rolling walker (2 wheels)  OT Visit Diagnosis: Unsteadiness on feet (R26.81);Other abnormalities of gait and mobility (R26.89);History of falling (Z91.81)   Activity Tolerance Patient tolerated treatment well;Patient limited by fatigue   Patient Left with call bell/phone within reach;in chair;with chair alarm set;with nursing/sitter in room   Nurse Communication Mobility status        Time: 1025-8527 OT Time Calculation (min): 24 min  Charges: OT General Charges $OT Visit: 1 Visit OT Treatments $Self Care/Home Management : 23-37 mins  Shara Blazing, M.S., OTR/L Ascom: [phone] 05/30/22, 1:14 PM

## 2022-05-30 NOTE — Plan of Care (Signed)

## 2022-05-30 NOTE — Progress Notes (Signed)
Inpatient Diabetes Program Recommendations  AACE/ADA: New Consensus Statement on Inpatient Glycemic Control (2015)  Target Ranges:  Prepandial:   less than 140 mg/dL      Peak postprandial:   less than 180 mg/dL (1-2 hours)      Critically ill patients:  140 - 180 mg/dL   Lab Results  Component Value Date   GLUCAP 217 (H) 05/26/2022   HGBA1C 5.5 05/17/2022    Review of Glycemic Control  Latest Reference Range & Units 05/29/22 04:37 05/30/22 10:09  Glucose 70 - 99 mg/dL 177 (H) 339 (H)   Diabetes history: DM 2 Outpatient Diabetes medications:  Amaryl 4 mg bid, Metformin 500 mg bid Current orders for Inpatient glycemic control:  None  Inpatient Diabetes Program Recommendations:    Lab glucose=339 mg/dL. If appropriate, consider restart of Novolog sensitive correction tid with meals and HS.    Thanks,  Adah Perl, RN, BC-ADM Inpatient Diabetes Coordinator Pager [phone]  (8a-5p)

## 2022-05-30 NOTE — TOC Progression Note (Signed)
Transition of Care [facility]) - Progression Note    Patient Details  Name: Randall [name] MRN: [id number] Date of Birth: [birth year]  [facility] CM/SW [place], LCSW Phone Number: 05/30/2022, 11:34 AM  Clinical Narrative:  [facility] representative has left a message for patient's wife.   Expected Discharge Plan: [place] Barriers to Discharge: Continued Medical Work up  Expected Discharge Plan and Services Expected Discharge Plan: [place] Choice: [place] arrangements for the past 2 months: Single Family Home                                       Social Determinants of Health (SDOH) Interventions Utilities Interventions: Intervention Not Indicated  Readmission Risk Interventions     No data to display

## 2022-05-30 NOTE — Progress Notes (Addendum)
Mobility Specialist - Progress Note   05/30/22 1041  Mobility  Activity Ambulated with assistance in hallway  Level of Assistance Minimal assist, patient does 75% or more  Assistive Device Front wheel walker  Distance Ambulated (ft) 80 ft  Activity Response Tolerated well  $Mobility charge 1 Mobility     Pt sitting in recliner upon arrival, utilizing RA. R lateral lean in sitting. Pt able to stand to RW with modA on 1st attempt before progressing to minA. VC and Extra time for weight-shifting anteriorly for balance. Pt increased ambulatory distance this date to 50' in hallway with assist needed for sequencing steps and navigating RW. Noted mild posterior lean and R lateral lean during ambulation as well this date. Pt returned to chair with alarm set, needs in reach. Pillow wedge support provided for midline support.     Kathee Delton Mobility Specialist 05/30/22, 10:57 AM

## 2022-05-30 NOTE — Progress Notes (Signed)
PROGRESS NOTE    Randall [name]  UTM:[id number] DOB: [birth year] DOA: 05/17/2022 PCP: Adin Hector, MD    Brief Narrative:  71 y.o. male with medical history significant of hypertension, hyperlipidemia, diabetes mellitus type 2, Parkinson's disease with dementia presents after having a fall at home.  Patient was on the floor for about 12 hours.  By time he arrived in the hospital, he was found to have significant rhabdomyolysis with CK level of 8278.  Renal function still normal.  He was placed on IV fluids. Patient CK level was normalized on 9/12.  However, patient developed some confusion as well as dysphagia.  PT/OT recommended nursing home placement.     On 05/24/2022 urine very dark.  Patient given a fluid bolus and urine cleared up a little bit.  No blood in the urine.   On 05/25/2022.  Patient was having facial tremor and difficulty eating and taking meds.   05/26/2022 through 05/28/2022.  The patient has not been eating very much.  His urine is dark.  Bilirubin slightly high.  Previous urine analysis did not show any blood in the urine.  The patient wants to get out of the hospital.  On 05/28/2022 and his wife chose a bed at [facility] and insurance authorization is undergoing.  9/20: Received notification from [facility] that patient's insurance carrier has declined skilled nursing facility authorization.  Performed peer to peer with medical director at [facility] Dr. Coralie Carpen at [phone].  At this time denial is upheld.   Assessment & Plan:   Principal Problem:   Failure to thrive in adult Active Problems:   Rhabdomyolysis   Acute delirium   Parkinson's disease ([place])   Fall at home, initial encounter   Leukocytosis   Hyponatremia   Type 2 diabetes mellitus with hyperlipidemia (HCC)   Transaminitis   Hyperbilirubinemia   Dysphagia   Dementia with behavioral disturbance (HCC)   Constipation   Weakness   Hypokalemia  * Failure to thrive in adult Patient having  difficulty eating with Parkinson's and tremors.  Patient on a dysphagia 3 diet with thin liquids.  Appreciate palliative and speech therapy following.  Patient is a DNR.  Patient not eating very much and urine is very dark.  Attempted feed is much as tolerated but feel this is more chronic and functional decline.  Attempted to secure placement at skilled nursing facility however insurance carrier has declined citing custodial care and chronic issues.  Discussed discharge difficulties with TOC and patient's wife.  TOC and care patrol engaged to assist with assisted living facility placement   Rhabdomyolysis, resolved Traumatic rhabdomyolysis secondary to fall.  CPK 4735 on presentation down to 42.     Acute delirium Answers questions appropriately.  Continue Seroquel at night.  Patient wants to get out of the hospital.  Patient's wife would like him placed in a skilled nursing facility however at this time insurance carrier has declined.  Attempting to secure placement in assisted living facility with assistance of case management and care patrol   Parkinson's disease ([place]) Continue Sinemet and Requip 5 times a day   Hyponatremia Now resolved.  Sodium in reference range.  Suspect SIADH   Type 2 diabetes mellitus with hyperlipidemia ([place]) Hold Mevacor with rhabdomyolysis.  Last hemoglobin A1c 5.5.   Transaminitis AST and ALT normal range.  Total bilirubin up at 1.9 and indirect bilirubin up at 1.6.  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  Urine did not show any  blood or bilirubin.   Hypokalemia Replaced   Weakness Physical therapy recommending rehab.   Constipation Abdominal x-ray does not show constipation.  Did have a small bowel movement.  Likely not eating enough.   Dementia with behavioral disturbance (HCC) Continue seroquel at night and exelon.  Haldol is not a good medication for parkinsons patients.   DVT prophylaxis: SQ Lovenox Code Status: DNR Family  Communication: wife Randall [name] [phone] on 9/20. Disposition Plan: Status is: Inpatient Remains inpatient appropriate because: Unsafe discharge plan.  Insurance carrier has denied placement in skilled nursing facility.  Attempting to coordinate with TOC and patient's family to determine appropriate disposition plan.   Level of care: Med-Surg  Consultants:  Palliative care  Procedures:  None  Antimicrobials: None   Subjective: Patient seen and examined.  Speech more clear this morning.  Mentation appears clear.  Patient waxes and wanes.  Objective: Vitals:   05/29/22 1645 05/30/22 0452 05/30/22 0528 05/30/22 0842  BP: 134/74 (!) 145/86 (!) 149/77 113/89  Pulse: 92 72 72 79  Resp: '16 18 18 16  '$ Temp: 98 F (36.7 C) 98.3 F (36.8 C) 97.8 F (36.6 C) 98.1 F (36.7 C)  TempSrc:  Oral    SpO2: 100% 99% 100% 98%  Weight:      Height:        Intake/Output Summary (Last 24 hours) at 05/30/2022 1052 Last data filed at 05/29/2022 1300 Gross per 24 hour  Intake --  Output 400 ml  Net -400 ml   Filed Weights   05/17/22 1649 05/20/22 1527 05/21/22 0631  Weight: 67.1 kg 79 kg 76.7 kg    Examination:  General exam: No acute distress.  Tremulous Respiratory system: Clear.  Normal work of breathing.  Room air Cardiovascular system: S1-S2, RRR, no murmurs, no LE edema Gastrointestinal system: Soft thin, NT/ND, normal bowel sounds Central nervous system: Alert.  Oriented x2.  No focal deficits Extremities: Decreased power symmetrically.  Diffuse tremor Skin: No rashes, lesions or ulcers Psychiatry: Judgement and insight appear impaired. Mood & affect flattened.     Data Reviewed: I have personally reviewed following labs and imaging studies  CBC: Recent Labs  Lab 05/24/22 0520 05/26/22 0613 05/29/22 0437  WBC  --  6.8 9.1  HGB 13.0 12.7* 13.0  HCT  --  40.1 40.1  MCV  --  92.2 93.0  PLT  --  370 637   Basic Metabolic Panel: Recent Labs  Lab 05/24/22 0520  05/26/22 0613 05/27/22 0718 05/29/22 0437 05/30/22 1009  NA 137 137 138 141 137  K 3.8 3.4* 3.8 3.2* 3.7  CL 100 101 103 102 99  CO2 '28 25 28 29 29  '$ GLUCOSE 180* 167* 177* 177* 339*  BUN '23 22 23 '$ 25* 21  CREATININE 0.85 0.70 0.68 0.85 0.79  CALCIUM 9.2 9.2 9.1 9.4 9.4   GFR: Estimated Creatinine Clearance: 81.5 mL/min (by C-G formula based on SCr of 0.79 mg/dL). Liver Function Tests: Recent Labs  Lab 05/27/22 0719 05/29/22 0437  AST 25 14*  ALT 6 9  ALKPHOS 69 68  BILITOT 1.9* 1.7*  PROT 6.3* 6.6  ALBUMIN 3.6 3.7   No results for input(s): "LIPASE", "AMYLASE" in the last 168 hours. No results for input(s): "AMMONIA" in the last 168 hours. Coagulation Profile: No results for input(s): "INR", "PROTIME" in the last 168 hours. Cardiac Enzymes: Recent Labs  Lab 05/27/22 0718  CKTOTAL 42*   BNP (last 3 results) No results for input(s): "PROBNP" in  the last 8760 hours. HbA1C: No results for input(s): "HGBA1C" in the last 72 hours. CBG: Recent Labs  Lab 05/25/22 1839 05/25/22 2130 05/26/22 0850 05/26/22 1158 05/26/22 1615  GLUCAP 220* 173* 173* 164* 217*   Lipid Profile: No results for input(s): "CHOL", "HDL", "LDLCALC", "TRIG", "CHOLHDL", "LDLDIRECT" in the last 72 hours. Thyroid Function Tests: No results for input(s): "TSH", "T4TOTAL", "FREET4", "T3FREE", "THYROIDAB" in the last 72 hours. Anemia Panel: No results for input(s): "VITAMINB12", "FOLATE", "FERRITIN", "TIBC", "IRON", "RETICCTPCT" in the last 72 hours. Sepsis Labs: No results for input(s): "PROCALCITON", "LATICACIDVEN" in the last 168 hours.  No results found for this or any previous visit (from the past 240 hour(s)).       Radiology Studies: No results found.      Scheduled Meds:  carbidopa-levodopa  2 tablet Oral 5 X Daily   enoxaparin (LOVENOX) injection  40 mg Subcutaneous Q24H   feeding supplement  237 mL Oral TID BM   gabapentin  300 mg Oral TID   lactulose  30 g Oral Daily    multivitamin with minerals  1 tablet Oral Daily   QUEtiapine  75 mg Oral QHS   rivastigmine  1.5 mg Oral BID   rOPINIRole  2 mg Oral 5 X Daily   sodium chloride flush  3 mL Intravenous Q12H   Continuous Infusions:   LOS: 13 days      Sidney Ace, MD [facility]   If 7PM-7AM, please contact night-coverage  05/30/2022, 10:52 AM

## 2022-05-30 NOTE — Progress Notes (Signed)
Physical Therapy Treatment Patient Details Name: Randall [name] MRN: [id number] DOB: 1950/12/28 Today's Date: 05/30/2022   History of Present Illness Pt is a 71 year old male admitted after being found down on floor for 12 hours, admitted with significant rhabdomyolysis; PMH significant for  hypertension, hyperlipidemia, diabetes mellitus type 2, Parkinson's disease with dementia. Pt required haldol on 05/19/21 2/2 agitation/sundowning.    PT Comments    Pt is making gradual progress towards goals with ability to participate despite fatigue level. Pt needs increased assist this date for all there-ex and mobility attempts. Standing x 2 attempts with +2 assist. Will continue to progress as able. Recommend to see in AM for best session.  Recommendations for follow up therapy are one component of a multi-disciplinary discharge planning process, led by the attending physician.  Recommendations may be updated based on patient status, additional functional criteria and insurance authorization.  Follow Up Recommendations  Skilled nursing-short term rehab (<3 hours/day) Can patient physically be transported by private vehicle: No   Assistance Recommended at Discharge Frequent or constant Supervision/Assistance  Patient can return home with the following Assistance with cooking/housework;Assist for transportation;Help with stairs or ramp for entrance;Direct supervision/assist for financial management;A lot of help with walking and/or transfers;A lot of help with bathing/dressing/bathroom   Equipment Recommendations  Rolling walker (2 wheels);BSC/3in1;Wheelchair (measurements PT);Wheelchair cushion (measurements PT)    Recommendations for Other Services       Precautions / Restrictions Precautions Precautions: Fall Precaution Comments: Aspiration Restrictions Weight Bearing Restrictions: No     Mobility  Bed Mobility Overal bed mobility: Needs Assistance Bed Mobility: Supine to Sit      Supine to sit: Mod assist, Max assist Sit to supine: Max assist   General bed mobility comments: very sleepy , with heavy assist required for trunkal elevation and once seated at EOB, needs mod assist for seated balance. Occasional spurt of sitting with supervision    Transfers Overall transfer level: Needs assistance Equipment used: Rolling walker (2 wheels) Transfers: Sit to/from Stand Sit to Stand: Max assist           General transfer comment: standing attempts x 2 with poor balance and standing tolerance. B flexed knees and trunk. Unable to take steps without max assist for sidestepping up bed with +2 assist. Unable to ambulate this session    Ambulation/Gait                   Stairs             Wheelchair Mobility    Modified Rankin (Stroke Patients Only)       Balance Overall balance assessment: Needs assistance Sitting-balance support: Bilateral upper extremity supported, Feet supported Sitting balance-Leahy Scale: Poor                                      Cognition Arousal/Alertness: Awake/alert Behavior During Therapy: WFL for tasks assessed/performed Overall Cognitive Status: History of cognitive impairments - at baseline                                 General Comments: speech garbled this date and pt very sleepy, however once awake does state he would like to participate        Exercises Other Exercises Other Exercises: supine ther-ex performed on B LE including SLRs, hip abd/add, SAQ,  and heel slides. 10 reps performed with min assist    General Comments        Pertinent Vitals/Pain Pain Assessment Pain Assessment: No/denies pain    Home Living                          Prior Function            PT Goals (current goals can now be found in the care plan section) Acute Rehab PT Goals Patient Stated Goal: rehab then home PT Goal Formulation: With patient/family Time For Goal  Achievement: 06/02/22 Potential to Achieve Goals: Good Progress towards PT goals: Progressing toward goals    Frequency    Min 2X/week      PT Plan Current plan remains appropriate    Co-evaluation              AM-PAC PT "6 Clicks" Mobility   Outcome Measure  Help needed turning from your back to your side while in a flat bed without using bedrails?: A Little Help needed moving from lying on your back to sitting on the side of a flat bed without using bedrails?: A Lot Help needed moving to and from a bed to a chair (including a wheelchair)?: Total Help needed standing up from a chair using your arms (e.g., wheelchair or bedside chair)?: Total Help needed to walk in hospital room?: Total Help needed climbing 3-5 steps with a railing? : Total 6 Click Score: 9    End of Session Equipment Utilized During Treatment: Gait belt Activity Tolerance: Patient limited by fatigue Patient left: in bed;with bed alarm set Nurse Communication: Mobility status;Precautions PT Visit Diagnosis: Unsteadiness on feet (R26.81);History of falling (Z91.81);Muscle weakness (generalized) (M62.81);Other abnormalities of gait and mobility (R26.89)     Time: 2706-2376 PT Time Calculation (min) (ACUTE ONLY): 25 min  Charges:  $Therapeutic Exercise: 8-22 mins $Therapeutic Activity: 8-22 mins                     Greggory Stallion, PT, DPT, GCS [phone]    Travell Desaulniers 05/30/2022, 4:21 PM

## 2022-05-31 DIAGNOSIS — R627 Adult failure to thrive: Secondary | ICD-10-CM | POA: Diagnosis not present

## 2022-05-31 LAB — GLUCOSE, CAPILLARY
Glucose-Capillary: 222 mg/dL — ABNORMAL HIGH (ref 70–99)
Glucose-Capillary: 250 mg/dL — ABNORMAL HIGH (ref 70–99)
Glucose-Capillary: 214 mg/dL — ABNORMAL HIGH (ref 70–99)
Glucose-Capillary: 132 mg/dL — ABNORMAL HIGH (ref 70–99)

## 2022-05-31 MED ORDER — ZIPRASIDONE MESYLATE 20 MG IM SOLR
10.0000 mg | Freq: Once | INTRAMUSCULAR | Status: AC
  Administered 2022-05-31: 10 mg via INTRAMUSCULAR
  Filled 2022-05-31: qty 20

## 2022-05-31 MED ORDER — TEMAZEPAM 7.5 MG PO CAPS
7.5000 mg | ORAL_CAPSULE | Freq: Every evening | ORAL | Status: DC | PRN
  Administered 2022-06-16: 7.5 mg via ORAL
  Filled 2022-05-31 (×3): qty 1

## 2022-05-31 NOTE — Progress Notes (Signed)
Physical Therapy Re-Evaluation Patient Details Name: Randall [name] MRN: [id number] DOB: 19-Dec-1950 Today's Date: 05/31/2022   History of Present Illness Pt is a 71 year old male admitted after being found down on floor for 12 hours, admitted with significant rhabdomyolysis; PMH significant for  hypertension, hyperlipidemia, diabetes mellitus type 2, Parkinson's disease with dementia. Pt required haldol on 05/19/21 2/2 agitation/sundowning.    PT Comments    Patient alert, garbled speech but able to communicate with his wife and make jokes. Did not report pain (did at the end of session indicated abdominal muscle fatigue from exercises). Pt seen for re-evaluation and goals updated as needed.  He was able to initiate all mobility today, ultimately minA for bed mobility despite encouragement and time. Intermittently needs tactile cues to avoid posterior lean in sitting, especially with exercises and fatigue. Sit <> stand with minA due to posterior propulsion with initial standing, does progress to CGA with static standing. He ambulated ~13f with varying gait speed, festinating gait (very decreased step length noted today), and several LOB when turning with RW, requiring min-modA to correct. Returned to room with all needs in reach at end of session. The patient would benefit from further skilled PT intervention to continue to progress towards goals. Recommendation remains appropriate.       Recommendations for follow up therapy are one component of a multi-disciplinary discharge planning process, led by the attending physician.  Recommendations may be updated based on patient status, additional functional criteria and insurance authorization.  Follow Up Recommendations  Skilled nursing-short term rehab (<3 hours/day) Can patient physically be transported by private vehicle: No   Assistance Recommended at Discharge Frequent or constant Supervision/Assistance  Patient can return home with the  following Assistance with cooking/housework;Assist for transportation;Help with stairs or ramp for entrance;Direct supervision/assist for financial management;A lot of help with walking and/or transfers;A lot of help with bathing/dressing/bathroom   Equipment Recommendations  Rolling walker (2 wheels);BSC/3in1;Wheelchair (measurements PT);Wheelchair cushion (measurements PT)    Recommendations for Other Services       Precautions / Restrictions Precautions Precautions: Fall Precaution Comments: Aspiration Restrictions Weight Bearing Restrictions: No     Mobility  Bed Mobility Overal bed mobility: Needs Assistance Bed Mobility: Supine to Sit, Sit to Supine     Supine to sit: Min assist Sit to supine: Min assist        Transfers Overall transfer level: Needs assistance Equipment used: Rolling walker (2 wheels) Transfers: Sit to/from Stand Sit to Stand: Min assist           General transfer comment: posterior push noted when coming to standing. minA to sit safely with good technique at EOB    Ambulation/Gait Ambulation/Gait assistance: Min assist, Min guard, Mod assist Gait Distance (Feet): 180 Feet Assistive device: Rolling walker (2 wheels)         General Gait Details: festinating gait with increased LOB during turns. RW used with min-mod assist for recovery. Cues for sequencing RW   Stairs             Wheelchair Mobility    Modified Rankin (Stroke Patients Only)       Balance Overall balance assessment: Needs assistance Sitting-balance support: Feet supported, Single extremity supported Sitting balance-Leahy Scale: Fair Sitting balance - Comments: Intermittent tactlie cueint to maintain upright position 2/2 posterior lean. Postural control: Posterior lean   Standing balance-Leahy Scale: Fair Standing balance comment: initial standing balance always poor, progresses with time, RW and cueig  Cognition  Arousal/Alertness: Awake/alert Behavior During Therapy: WFL for tasks assessed/performed Overall Cognitive Status: History of cognitive impairments - at baseline                                 General Comments: speech garbled        Exercises General Exercises - Lower Extremity Long Arc Quad: AROM, Strengthening, Both, 20 reps Hip Flexion/Marching: AROM, Strengthening, Both, 20 reps Toe Raises: AROM, Strengthening, Both, 10 reps Heel Raises: AROM, Strengthening, Both, 10 reps    General Comments        Pertinent Vitals/Pain Pain Assessment Pain Assessment: Faces Faces Pain Scale: No hurt    Home Living                          Prior Function            PT Goals (current goals can now be found in the care plan section) Acute Rehab PT Goals Time For Goal Achievement: 06/14/22 Progress towards PT goals: Progressing toward goals    Frequency    Min 2X/week      PT Plan Current plan remains appropriate    Co-evaluation              AM-PAC PT "6 Clicks" Mobility   Outcome Measure  Help needed turning from your back to your side while in a flat bed without using bedrails?: A Little Help needed moving from lying on your back to sitting on the side of a flat bed without using bedrails?: A Lot Help needed moving to and from a bed to a chair (including a wheelchair)?: A Lot Help needed standing up from a chair using your arms (e.g., wheelchair or bedside chair)?: A Lot Help needed to walk in hospital room?: A Lot Help needed climbing 3-5 steps with a railing? : Total 6 Click Score: 12    End of Session Equipment Utilized During Treatment: Gait belt Activity Tolerance: Patient tolerated treatment well Patient left: in bed;with bed alarm set;with family/visitor present;with call bell/phone within reach Nurse Communication: Mobility status PT Visit Diagnosis: Unsteadiness on feet (R26.81);History of falling (Z91.81);Muscle weakness  (generalized) (M62.81);Other abnormalities of gait and mobility (R26.89)     Time: 0174-9449 PT Time Calculation (min) (ACUTE ONLY): 23 min  Charges:  $Therapeutic Exercise: 23-37 mins                     Lieutenant Diego PT, DPT 9:35 AM,05/31/22

## 2022-05-31 NOTE — Progress Notes (Signed)
Manufacturing engineer [facility]) Liaison Note  Patient is currently followed at by [facility] outpatient Palliative program at home. [facility] would continue to follow at discharge if family is agreeable.  [facility] notified.  Flo Shanks BSN, RN, [facility] [employer] [phone]

## 2022-05-31 NOTE — Progress Notes (Signed)
Mobility Specialist - Progress Note   Pre-mobility: HR, BP, SpO2 During mobility: HR, BP, SpO2 Post-mobility: HR, BP, SPO2     05/31/22 1552  Mobility  Activity Ambulated with assistance in hallway;Transferred from bed to chair  Level of Assistance Contact guard assist, steadying assist  Assistive Device Front wheel walker  Distance Ambulated (ft) 4 ft  Activity Response Tolerated well  $Mobility charge 1 Mobility   MS responded to bed alarm. Pt sitting EOB upon entry utilizing RA. Pt STS MinA to RW.

## 2022-05-31 NOTE — Care Management Important Message (Signed)
Important Message  Patient Details  Name: Randall [name] MRN: [id number] Date of Birth: July 14, 1951   Medicare Important Message Given:  Yes     Dannette Barbara 05/31/2022, 12:23 PM

## 2022-05-31 NOTE — Progress Notes (Signed)
PROGRESS NOTE    Randall [name]  KXF:[id number] DOB: 05-01-1951 DOA: 05/17/2022 PCP: Adin Hector, MD    Brief Narrative:  71 y.o. male with medical history significant of hypertension, hyperlipidemia, diabetes mellitus type 2, Parkinson's disease with dementia presents after having a fall at home.  Patient was on the floor for about 12 hours.  By time he arrived in the hospital, he was found to have significant rhabdomyolysis with CK level of 8278.  Renal function still normal.  He was placed on IV fluids. Patient CK level was normalized on 9/12.  However, patient developed some confusion as well as dysphagia.  PT/OT recommended nursing home placement.     On 05/24/2022 urine very dark.  Patient given a fluid bolus and urine cleared up a little bit.  No blood in the urine.   On 05/25/2022.  Patient was having facial tremor and difficulty eating and taking meds.   05/26/2022 through 05/28/2022.  The patient has not been eating very much.  His urine is dark.  Bilirubin slightly high.  Previous urine analysis did not show any blood in the urine.  The patient wants to get out of the hospital.  On 05/28/2022 and his wife chose a bed at [facility] and insurance authorization is undergoing.  9/20: Received notification from [facility] that patient's insurance carrier has declined skilled nursing facility authorization.  Performed peer to peer with medical director at [facility] Dr. Coralie Carpen at [phone].  At this time denial is upheld.  9/22: Denial upheld through [place].  TOC engaged and attempting to assist with assisted living facility placement.   Assessment & Plan:   Principal Problem:   Failure to thrive in adult Active Problems:   Rhabdomyolysis   Acute delirium   Parkinson's disease ([place])   Fall at home, initial encounter   Leukocytosis   Hyponatremia   Type 2 diabetes mellitus with hyperlipidemia (HCC)   Transaminitis   Hyperbilirubinemia   Dysphagia   Dementia with behavioral  disturbance (HCC)   Constipation   Weakness   Hypokalemia  * Failure to thrive in adult Patient having difficulty eating with Parkinson's and tremors.  Patient on a dysphagia 3 diet with thin liquids.  Appears to be eating more.  Energy level improving however expect this to be waxing and waning.  Patient remains a DNR.  Unfortunately HDA declined skilled nursing facility placement.  TOC engaged and care patrol to assist with assisted living facility placement.  Continue to ambulate.  Therapy as tolerated.  Encourage p.o. intake.   Rhabdomyolysis, resolved Traumatic rhabdomyolysis secondary to fall.  CPK 4735 on presentation down to 42.     Acute delirium Answers questions appropriately.  Continue Seroquel at night.  Patient wants to get out of the hospital.  Patient's wife would like him placed in a skilled nursing facility however at this time insurance carrier has declined.  Attempting to secure placement in assisted living facility with assistance of case management and care patrol   Parkinson's disease ([place]) Continue Sinemet and Requip 5 times a day   Hyponatremia Now resolved.  Sodium in reference range.  Suspect SIADH   Type 2 diabetes mellitus with hyperlipidemia ([place]) Hold Mevacor with rhabdomyolysis.  Last hemoglobin A1c 5.5.   Transaminitis AST and ALT normal range.  Total bilirubin up at 1.9 and indirect bilirubin up at 1.6.  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  Urine did not show any blood or bilirubin.   Hypokalemia Replaced  Weakness Physical therapy recommending rehab.   Constipation Abdominal x-ray does not show constipation.  Did have a small bowel movement.  Likely not eating enough.   Dementia with behavioral disturbance (HCC) Continue seroquel at night and exelon.  Haldol is not a good medication for parkinsons patients.   DVT prophylaxis: SQ Lovenox Code Status: DNR Family Communication: wife Noelle Hoogland [phone] on 9/20.,  At  bedside 9/22 Disposition Plan: Status is: Inpatient Remains inpatient appropriate because: Unsafe discharge plan.  Insurance carrier has denied placement in skilled nursing facility.  Attempting to coordinate with TOC and patient's family to determine appropriate disposition plan.   Level of care: Med-Surg  Consultants:  Palliative care  Procedures:  None  Antimicrobials: None   Subjective: Patient seen and examined.  Speech more clear this morning.  Mentation appears clear.  Patient waxes and wanes.  Objective: Vitals:   05/30/22 1145 05/30/22 1554 05/30/22 2006 05/31/22 0750  BP: (!) 158/66 (!) 143/80 (!) 106/59 113/68  Pulse: 63 76 66 73  Resp: '16 20 16 16  '$ Temp: 98.5 F (36.9 C) 98.3 F (36.8 C) 97.9 F (36.6 C) 97.7 F (36.5 C)  TempSrc:      SpO2: 98% 98% 100% 97%  Weight:      Height:        Intake/Output Summary (Last 24 hours) at 05/31/2022 1102 Last data filed at 05/30/2022 1650 Gross per 24 hour  Intake --  Output 800 ml  Net -800 ml   Filed Weights   05/17/22 1649 05/20/22 1527 05/21/22 0631  Weight: 67.1 kg 79 kg 76.7 kg    Examination:  General exam: No acute distress.  Tremulous Respiratory system: Clear.  Normal work of breathing.  Room air Cardiovascular system: S1-S2, RRR, no murmurs, no LE edema Gastrointestinal system: Soft thin, NT/ND, normal bowel sounds Central nervous system: Alert.  Oriented x2.  No focal deficits Extremities: Decreased power symmetrically.  Diffuse tremor Skin: No rashes, lesions or ulcers Psychiatry: Judgement and insight appear impaired. Mood & affect flattened.     Data Reviewed: I have personally reviewed following labs and imaging studies  CBC: Recent Labs  Lab 05/26/22 0613 05/29/22 0437  WBC 6.8 9.1  HGB 12.7* 13.0  HCT 40.1 40.1  MCV 92.2 93.0  PLT 370 962   Basic Metabolic Panel: Recent Labs  Lab 05/26/22 0613 05/27/22 0718 05/29/22 0437 05/30/22 1009  NA 137 138 141 137  K 3.4* 3.8  3.2* 3.7  CL 101 103 102 99  CO2 '25 28 29 29  '$ GLUCOSE 167* 177* 177* 339*  BUN 22 23 25* 21  CREATININE 0.70 0.68 0.85 0.79  CALCIUM 9.2 9.1 9.4 9.4   GFR: Estimated Creatinine Clearance: 81.5 mL/min (by C-G formula based on SCr of 0.79 mg/dL). Liver Function Tests: Recent Labs  Lab 05/27/22 0719 05/29/22 0437  AST 25 14*  ALT 6 9  ALKPHOS 69 68  BILITOT 1.9* 1.7*  PROT 6.3* 6.6  ALBUMIN 3.6 3.7   No results for input(s): "LIPASE", "AMYLASE" in the last 168 hours. No results for input(s): "AMMONIA" in the last 168 hours. Coagulation Profile: No results for input(s): "INR", "PROTIME" in the last 168 hours. Cardiac Enzymes: Recent Labs  Lab 05/27/22 0718  CKTOTAL 42*   BNP (last 3 results) No results for input(s): "PROBNP" in the last 8760 hours. HbA1C: No results for input(s): "HGBA1C" in the last 72 hours. CBG: Recent Labs  Lab 05/26/22 1158 05/26/22 1615 05/30/22 1554 05/30/22 2008 05/31/22  0812  GLUCAP 164* 217* 322* 234* 222*   Lipid Profile: No results for input(s): "CHOL", "HDL", "LDLCALC", "TRIG", "CHOLHDL", "LDLDIRECT" in the last 72 hours. Thyroid Function Tests: No results for input(s): "TSH", "T4TOTAL", "FREET4", "T3FREE", "THYROIDAB" in the last 72 hours. Anemia Panel: No results for input(s): "VITAMINB12", "FOLATE", "FERRITIN", "TIBC", "IRON", "RETICCTPCT" in the last 72 hours. Sepsis Labs: No results for input(s): "PROCALCITON", "LATICACIDVEN" in the last 168 hours.  No results found for this or any previous visit (from the past 240 hour(s)).       Radiology Studies: No results found.      Scheduled Meds:  carbidopa-levodopa  2 tablet Oral 5 X Daily   enoxaparin (LOVENOX) injection  40 mg Subcutaneous Q24H   feeding supplement  237 mL Oral TID BM   gabapentin  300 mg Oral TID   insulin aspart  0-9 Units Subcutaneous TID AC & HS   multivitamin with minerals  1 tablet Oral Daily   QUEtiapine  75 mg Oral QHS   rivastigmine  1.5 mg  Oral BID   rOPINIRole  2 mg Oral 5 X Daily   sodium chloride flush  3 mL Intravenous Q12H   Continuous Infusions:   LOS: 14 days      Sidney Ace, MD [facility]   If 7PM-7AM, please contact night-coverage  05/31/2022, 11:02 AM

## 2022-05-31 NOTE — Progress Notes (Signed)
Mobility Specialist - Progress Note   05/31/22 1552  Mobility  Activity Ambulated with assistance in hallway;Transferred from bed to chair  Level of Assistance Contact guard assist, steadying assist  Assistive Device Front wheel walker  Distance Ambulated (ft) 10 ft  Activity Response Tolerated well  $Mobility charge 1 Mobility   MS responded to bed alarm. Pt sitting EOB upon entry, utilizing RA. Pt STS MinA to RW. Pt ambulated around the bed to the chair using RW with CGA. Pt momentarily had a lose of balance while standing in front of the chair, ModA to recover. Pt left sitting in chair with alarm set and needs within reach.   Candie Mile Mobility Specialist 05/31/22 4:00 PM

## 2022-05-31 NOTE — Progress Notes (Signed)
Mobility Specialist - Progress Note   05/31/22 1000  Mobility  Activity Refused mobility     Pt politely declined mobility at this time; requests to attempt OOB activity later this date. Pt with breakfast tray in front of him with less than 75% eaten---pt encouraged to eat. Spouse at bedside. Will attempt another date/time.    Kathee Delton Mobility Specialist 05/31/22, 10:12 AM

## 2022-05-31 NOTE — TOC Progression Note (Addendum)
Transition of Care [facility]) - Progression Note    Patient Details  Name: Randall [name] MRN: [id number] Date of Birth: [birth year]  Transition of [facility]) CM/SW Contact  Beverly Sessions, RN Phone Number: 05/31/2022, 1:41 PM  Clinical Narrative:      Denial letter from [facility] left at bedside  Vm left for wife to notify  Per Danielle with care patrol she was able to reach wife yesterday she is to follow up with wife again this weekend to discuss options for private paying for LTC Expected Discharge Plan: [place] Barriers to Discharge: Continued Medical Work up  Expected Discharge Plan and Services Expected Discharge Plan: [place] arrangements for the past 2 months: Single Family Home                                       Social Determinants of Health (SDOH) Interventions Utilities Interventions: Intervention Not Indicated  Readmission Risk Interventions     No data to display

## 2022-06-01 DIAGNOSIS — R627 Adult failure to thrive: Secondary | ICD-10-CM | POA: Diagnosis not present

## 2022-06-01 LAB — GLUCOSE, CAPILLARY
Glucose-Capillary: 209 mg/dL — ABNORMAL HIGH (ref 70–99)
Glucose-Capillary: 312 mg/dL — ABNORMAL HIGH (ref 70–99)
Glucose-Capillary: 210 mg/dL — ABNORMAL HIGH (ref 70–99)
Glucose-Capillary: 215 mg/dL — ABNORMAL HIGH (ref 70–99)

## 2022-06-01 MED ORDER — HALOPERIDOL 0.5 MG PO TABS
1.0000 mg | ORAL_TABLET | Freq: Three times a day (TID) | ORAL | Status: DC | PRN
  Administered 2022-06-08 – 2022-06-18 (×6): 1 mg via ORAL
  Filled 2022-06-01 (×7): qty 2

## 2022-06-01 MED ORDER — HALOPERIDOL LACTATE 5 MG/ML IJ SOLN
1.0000 mg | Freq: Three times a day (TID) | INTRAMUSCULAR | Status: DC | PRN
  Administered 2022-06-03 – 2022-06-05 (×3): 1 mg via INTRAMUSCULAR
  Filled 2022-06-01 (×4): qty 1

## 2022-06-01 NOTE — Progress Notes (Signed)
PROGRESS NOTE    Randall [name]  HFW:[id number] DOB: 03-22-1951 DOA: 05/17/2022 PCP: Adin Hector, MD    Brief Narrative:  71 y.o. male with medical history significant of hypertension, hyperlipidemia, diabetes mellitus type 2, Parkinson's disease with dementia presents after having a fall at home.  Patient was on the floor for about 12 hours.  By time he arrived in the hospital, he was found to have significant rhabdomyolysis with CK level of 8278.  Renal function still normal.  He was placed on IV fluids. Patient CK level was normalized on 9/12.  However, patient developed some confusion as well as dysphagia.  PT/OT recommended nursing home placement.     On 05/24/2022 urine very dark.  Patient given a fluid bolus and urine cleared up a little bit.  No blood in the urine.   On 05/25/2022.  Patient was having facial tremor and difficulty eating and taking meds.   05/26/2022 through 05/28/2022.  The patient has not been eating very much.  His urine is dark.  Bilirubin slightly high.  Previous urine analysis did not show any blood in the urine.  The patient wants to get out of the hospital.  On 05/28/2022 and his wife chose a bed at [facility] and insurance authorization is undergoing.  9/20: Received notification from [facility] that patient's insurance carrier has declined skilled nursing facility authorization.  Performed peer to peer with medical director at [facility] Dr. Coralie Carpen at [phone].  At this time denial is upheld.  9/22: Denial upheld through [place].  TOC engaged and attempting to assist with assisted living facility placement.   Assessment & Plan:   Principal Problem:   Failure to thrive in adult Active Problems:   Rhabdomyolysis   Acute delirium   Parkinson's disease ([place])   Fall at home, initial encounter   Leukocytosis   Hyponatremia   Type 2 diabetes mellitus with hyperlipidemia (HCC)   Transaminitis   Hyperbilirubinemia   Dysphagia   Dementia with behavioral  disturbance (HCC)   Constipation   Weakness   Hypokalemia  * Failure to thrive in adult Patient having difficulty eating with Parkinson's and tremors.  Patient on a dysphagia 3 diet with thin liquids.  Appears to be eating more.  Energy level improving however expect this to be waxing and waning.  Patient remains a DNR.  Unfortunately HDA declined skilled nursing facility placement.  TOC engaged and care patrol to assist with assisted living facility placement.  Continue to ambulate.  Therapy as tolerated.  Encourage p.o. intake.   Rhabdomyolysis, resolved Traumatic rhabdomyolysis secondary to fall.  CPK 4735 on presentation down to 42.     Acute delirium Patient's mental status waxing and waning.  Occasional delirium noted with impulsiveness.  This morning status appears improved.  We will continue Seroquel at night.  Attempted minimize use of antipsychotics and benzodiazepines.  TOC assisting patient and family with assisted living facility placement.  Parkinson's disease ([place]) Continue Sinemet and Requip 5 times a day   Hyponatremia Now resolved.  Sodium in reference range.  Suspect SIADH   Type 2 diabetes mellitus with hyperlipidemia (HCC) Last hemoglobin A1c 5.5.   Transaminitis AST and ALT normal range.  Total bilirubin up at 1.9 and indirect bilirubin up at 1.6.  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  Urine did not show any blood or bilirubin.   Hypokalemia Replaced   Weakness Physical therapy recommending rehab.   Constipation Abdominal x-ray does not show constipation.  Did have a small bowel movement.  Likely not eating enough.   Dementia with behavioral disturbance (HCC) Continue seroquel at night and exelon.  Haldol is not a good medication for parkinsons patients.   DVT prophylaxis: SQ Lovenox Code Status: DNR Family Communication: wife Fransico Sciandra [phone] on 9/20.,  At bedside 9/22 Disposition Plan: Status is: Inpatient Remains inpatient  appropriate because: Unsafe discharge plan.  Insurance carrier has denied placement in skilled nursing facility.  Attempting to coordinate with TOC and patient's family to determine appropriate disposition plan.   Level of care: Med-Surg  Consultants:  Palliative care  Procedures:  None  Antimicrobials: None   Subjective: Patient seen and examined.  Speech more clear this morning.  Mentation appears clear.  Mentation waxes and wanes.  Objective: Vitals:   05/31/22 1553 05/31/22 2056 06/01/22 0515 06/01/22 0726  BP: 132/83 (!) 159/80 (!) 157/84 137/76  Pulse: 88 67 76 73  Resp: '16 20 16 17  '$ Temp: 97.9 F (36.6 C) 98 F (36.7 C) 98.5 F (36.9 C) 97.9 F (36.6 C)  TempSrc:  Oral Oral   SpO2: 99% 100% 98% 98%  Weight:      Height:        Intake/Output Summary (Last 24 hours) at 06/01/2022 1014 Last data filed at 06/01/2022 0624 Gross per 24 hour  Intake 400 ml  Output 1300 ml  Net -900 ml   Filed Weights   05/17/22 1649 05/20/22 1527 05/21/22 0631  Weight: 67.1 kg 79 kg 76.7 kg    Examination:  General exam: No acute distress Respiratory system: Clear.  Normal work of breathing.  Room air Cardiovascular system: S1-S2, RRR, no murmurs, no LE edema Gastrointestinal system: Soft thin, NT/ND, normal bowel sounds Central nervous system: Alert.  Oriented x2.  No focal deficits Extremities: Decreased power symmetrically.  Diffuse tremor Skin: No rashes, lesions or ulcers Psychiatry: Judgement and insight appear impaired. Mood & affect flattened.     Data Reviewed: I have personally reviewed following labs and imaging studies  CBC: Recent Labs  Lab 05/26/22 0613 05/29/22 0437  WBC 6.8 9.1  HGB 12.7* 13.0  HCT 40.1 40.1  MCV 92.2 93.0  PLT 370 825   Basic Metabolic Panel: Recent Labs  Lab 05/26/22 0613 05/27/22 0718 05/29/22 0437 05/30/22 1009  NA 137 138 141 137  K 3.4* 3.8 3.2* 3.7  CL 101 103 102 99  CO2 '25 28 29 29  '$ GLUCOSE 167* 177* 177* 339*   BUN 22 23 25* 21  CREATININE 0.70 0.68 0.85 0.79  CALCIUM 9.2 9.1 9.4 9.4   GFR: Estimated Creatinine Clearance: 81.5 mL/min (by C-G formula based on SCr of 0.79 mg/dL). Liver Function Tests: Recent Labs  Lab 05/27/22 0719 05/29/22 0437  AST 25 14*  ALT 6 9  ALKPHOS 69 68  BILITOT 1.9* 1.7*  PROT 6.3* 6.6  ALBUMIN 3.6 3.7   No results for input(s): "LIPASE", "AMYLASE" in the last 168 hours. No results for input(s): "AMMONIA" in the last 168 hours. Coagulation Profile: No results for input(s): "INR", "PROTIME" in the last 168 hours. Cardiac Enzymes: Recent Labs  Lab 05/27/22 0718  CKTOTAL 42*   BNP (last 3 results) No results for input(s): "PROBNP" in the last 8760 hours. HbA1C: No results for input(s): "HGBA1C" in the last 72 hours. CBG: Recent Labs  Lab 05/31/22 0812 05/31/22 1203 05/31/22 1550 05/31/22 2057 06/01/22 0753  GLUCAP 222* 250* 214* 132* 209*   Lipid Profile: No results for input(s): "CHOL", "  HDL", "LDLCALC", "TRIG", "CHOLHDL", "LDLDIRECT" in the last 72 hours. Thyroid Function Tests: No results for input(s): "TSH", "T4TOTAL", "FREET4", "T3FREE", "THYROIDAB" in the last 72 hours. Anemia Panel: No results for input(s): "VITAMINB12", "FOLATE", "FERRITIN", "TIBC", "IRON", "RETICCTPCT" in the last 72 hours. Sepsis Labs: No results for input(s): "PROCALCITON", "LATICACIDVEN" in the last 168 hours.  No results found for this or any previous visit (from the past 240 hour(s)).       Radiology Studies: No results found.      Scheduled Meds:  carbidopa-levodopa  2 tablet Oral 5 X Daily   enoxaparin (LOVENOX) injection  40 mg Subcutaneous Q24H   feeding supplement  237 mL Oral TID BM   gabapentin  300 mg Oral TID   insulin aspart  0-9 Units Subcutaneous TID AC & HS   multivitamin with minerals  1 tablet Oral Daily   QUEtiapine  75 mg Oral QHS   rivastigmine  1.5 mg Oral BID   rOPINIRole  2 mg Oral 5 X Daily   sodium chloride flush  3 mL  Intravenous Q12H   Continuous Infusions:   LOS: 15 days      Sidney Ace, MD [facility]   If 7PM-7AM, please contact night-coverage  06/01/2022, 10:14 AM

## 2022-06-02 ENCOUNTER — Inpatient Hospital Stay: Payer: PPO

## 2022-06-02 DIAGNOSIS — R627 Adult failure to thrive: Secondary | ICD-10-CM | POA: Diagnosis not present

## 2022-06-02 LAB — CBC WITH DIFFERENTIAL/PLATELET
Abs Immature Granulocytes: 0.04 10*3/uL (ref 0.00–0.07)
Basophils Absolute: 0.1 10*3/uL (ref 0.0–0.1)
Basophils Relative: 1 %
Eosinophils Absolute: 0.2 10*3/uL (ref 0.0–0.5)
Eosinophils Relative: 2 %
HCT: 42 % (ref 39.0–52.0)
Hemoglobin: 13.7 g/dL (ref 13.0–17.0)
Immature Granulocytes: 1 %
Lymphocytes Relative: 7 %
Lymphs Abs: 0.6 10*3/uL — ABNORMAL LOW (ref 0.7–4.0)
MCH: 29.9 pg (ref 26.0–34.0)
MCHC: 32.6 g/dL (ref 30.0–36.0)
MCV: 91.7 fL (ref 80.0–100.0)
Monocytes Absolute: 0.5 10*3/uL (ref 0.1–1.0)
Monocytes Relative: 6 %
Neutro Abs: 7.1 10*3/uL (ref 1.7–7.7)
Neutrophils Relative %: 83 %
Platelets: 327 10*3/uL (ref 150–400)
RBC: 4.58 MIL/uL (ref 4.22–5.81)
RDW: 13.5 % (ref 11.5–15.5)
WBC: 8.5 10*3/uL (ref 4.0–10.5)
nRBC: 0 % (ref 0.0–0.2)

## 2022-06-02 LAB — BASIC METABOLIC PANEL
Anion gap: 10 (ref 5–15)
BUN: 16 mg/dL (ref 8–23)
CO2: 27 mmol/L (ref 22–32)
Calcium: 9 mg/dL (ref 8.9–10.3)
Chloride: 98 mmol/L (ref 98–111)
Creatinine, Ser: 0.68 mg/dL (ref 0.61–1.24)
GFR, Estimated: >60 mL/min (ref >60)
Glucose, Bld: 218 mg/dL — ABNORMAL HIGH (ref 70–99)
Potassium: 4.4 mmol/L (ref 3.5–5.1)
Sodium: 135 mmol/L (ref 135–145)

## 2022-06-02 LAB — GLUCOSE, CAPILLARY
Glucose-Capillary: 179 mg/dL — ABNORMAL HIGH (ref 70–99)
Glucose-Capillary: 309 mg/dL — ABNORMAL HIGH (ref 70–99)
Glucose-Capillary: 197 mg/dL — ABNORMAL HIGH (ref 70–99)
Glucose-Capillary: 151 mg/dL — ABNORMAL HIGH (ref 70–99)

## 2022-06-02 NOTE — Progress Notes (Signed)
PROGRESS NOTE    Randall [name]  YTK:[id number] DOB: 23-Sep-1950 DOA: 05/17/2022 PCP: Adin Hector, MD    Brief Narrative:  71 y.o. male with medical history significant of hypertension, hyperlipidemia, diabetes mellitus type 2, Parkinson's disease with dementia presents after having a fall at home.  Patient was on the floor for about 12 hours.  By time he arrived in the hospital, he was found to have significant rhabdomyolysis with CK level of 8278.  Renal function still normal.  He was placed on IV fluids. Patient CK level was normalized on 9/12.  However, patient developed some confusion as well as dysphagia.  PT/OT recommended nursing home placement.     On 05/24/2022 urine very dark.  Patient given a fluid bolus and urine cleared up a little bit.  No blood in the urine.   On 05/25/2022.  Patient was having facial tremor and difficulty eating and taking meds.   05/26/2022 through 05/28/2022.  The patient has not been eating very much.  His urine is dark.  Bilirubin slightly high.  Previous urine analysis did not show any blood in the urine.  The patient wants to get out of the hospital.  On 05/28/2022 and his wife chose a bed at [facility] and insurance authorization is undergoing.  9/20: Received notification from [facility] that patient's insurance carrier has declined skilled nursing facility authorization.  Performed peer to peer with medical director at [facility] Dr. Coralie Carpen at [phone].  At this time denial is upheld.  9/22: Denial upheld through [place].  TOC engaged and attempting to assist with assisted living facility placement.   Assessment & Plan:   Principal Problem:   Failure to thrive in adult Active Problems:   Rhabdomyolysis   Acute delirium   Parkinson's disease ([place])   Fall at home, initial encounter   Leukocytosis   Hyponatremia   Type 2 diabetes mellitus with hyperlipidemia (HCC)   Transaminitis   Hyperbilirubinemia   Dysphagia   Dementia with behavioral  disturbance (HCC)   Constipation   Weakness   Hypokalemia  * Failure to thrive in adult Patient having difficulty eating with Parkinson's and tremors.  Patient on a dysphagia 3 diet with thin liquids.  Appears to be eating more.  Energy level improving however expect this to be waxing and waning.  Patient remains a DNR.  Unfortunately HDA declined skilled nursing facility placement.  TOC engaged and care patrol to assist with assisted living facility placement.  Continue to ambulate.  Therapy as tolerated.  Encourage p.o. intake.  Frequent reorienting measures   Rhabdomyolysis, resolved Traumatic rhabdomyolysis secondary to fall.  CPK 4735 on presentation down to 42.     Acute delirium Patient's mental status waxing and waning.  Occasional delirium noted with impulsiveness.  This morning status appears improved.  We will continue Seroquel at night.  Attempted minimize use of antipsychotics and benzodiazepines.  TOC assisting patient and family with assisted living facility placement.  Parkinson's disease ([place]) Continue Sinemet and Requip 5 times a day   Hyponatremia Now resolved.  Sodium in reference range.  Suspect SIADH   Type 2 diabetes mellitus with hyperlipidemia (HCC) Last hemoglobin A1c 5.5.   Transaminitis AST and ALT normal range.  Total bilirubin up at 1.9 and indirect bilirubin up at 1.6.  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  Urine did not show any blood or bilirubin.   Hypokalemia Replaced   Weakness Physical therapy recommending rehab.   Constipation Abdominal x-ray  does not show constipation.  Did have a small bowel movement.  Likely not eating enough.   Dementia with behavioral disturbance (HCC) Continue seroquel at night and exelon.  Haldol is not a good medication for parkinsons patients.   DVT prophylaxis: SQ Lovenox Code Status: DNR Family Communication: wife Roxanne Orner [phone] on 9/20.,  At bedside 9/22, via phone 9/24 Disposition  Plan: Status is: Inpatient Remains inpatient appropriate because: Unsafe discharge plan.  Insurance carrier has denied placement in skilled nursing facility.  Attempting to coordinate with TOC and patient's family to determine appropriate disposition plan.   Level of care: Med-Surg  Consultants:  Palliative care  Procedures:  None  Antimicrobials: None   Subjective: Patient seen and examined.  Speech more clear this morning.  Mentation appears clear.  Mentation waxes and wanes.  Objective: Vitals:   06/01/22 1613 06/01/22 2016 06/02/22 0511 06/02/22 0739  BP: 116/72 (!) 145/67 (!) 148/95 134/79  Pulse: 71 73 72 (!) 48  Resp: '16 16 16 18  '$ Temp: 98.7 F (37.1 C) 98 F (36.7 C) 97.8 F (36.6 C) 97.7 F (36.5 C)  TempSrc: Oral Oral    SpO2: 98% 94% 100% 96%  Weight:      Height:        Intake/Output Summary (Last 24 hours) at 06/02/2022 1015 Last data filed at 06/02/2022 0511 Gross per 24 hour  Intake 720 ml  Output 400 ml  Net 320 ml   Filed Weights   05/17/22 1649 05/20/22 1527 05/21/22 0631  Weight: 67.1 kg 79 kg 76.7 kg    Examination:  General exam: No acute distress Respiratory system: Clear.  Normal work of breathing.  Room air Cardiovascular system: S1-S2, RRR, no murmurs, no LE edema Gastrointestinal system: Soft thin, NT/ND, normal bowel sounds Central nervous system: Alert.  Oriented x2.  No focal deficits Extremities: Decreased power symmetrically.  Diffuse tremor Skin: No rashes, lesions or ulcers Psychiatry: Judgement and insight appear impaired. Mood & affect flattened.     Data Reviewed: I have personally reviewed following labs and imaging studies  CBC: Recent Labs  Lab 05/29/22 0437 06/02/22 0821  WBC 9.1 8.5  NEUTROABS  --  7.1  HGB 13.0 13.7  HCT 40.1 42.0  MCV 93.0 91.7  PLT 346 702   Basic Metabolic Panel: Recent Labs  Lab 05/27/22 0718 05/29/22 0437 05/30/22 1009 06/02/22 0821  NA 138 141 137 135  K 3.8 3.2* 3.7 4.4   CL 103 102 99 98  CO2 '28 29 29 27  '$ GLUCOSE 177* 177* 339* 218*  BUN 23 25* 21 16  CREATININE 0.68 0.85 0.79 0.68  CALCIUM 9.1 9.4 9.4 9.0   GFR: Estimated Creatinine Clearance: 81.5 mL/min (by C-G formula based on SCr of 0.68 mg/dL). Liver Function Tests: Recent Labs  Lab 05/27/22 0719 05/29/22 0437  AST 25 14*  ALT 6 9  ALKPHOS 69 68  BILITOT 1.9* 1.7*  PROT 6.3* 6.6  ALBUMIN 3.6 3.7   No results for input(s): "LIPASE", "AMYLASE" in the last 168 hours. No results for input(s): "AMMONIA" in the last 168 hours. Coagulation Profile: No results for input(s): "INR", "PROTIME" in the last 168 hours. Cardiac Enzymes: Recent Labs  Lab 05/27/22 0718  CKTOTAL 42*   BNP (last 3 results) No results for input(s): "PROBNP" in the last 8760 hours. HbA1C: No results for input(s): "HGBA1C" in the last 72 hours. CBG: Recent Labs  Lab 06/01/22 0753 06/01/22 1216 06/01/22 1546 06/01/22 2017 06/02/22 6378  GLUCAP 209* 312* 210* 215* 179*   Lipid Profile: No results for input(s): "CHOL", "HDL", "LDLCALC", "TRIG", "CHOLHDL", "LDLDIRECT" in the last 72 hours. Thyroid Function Tests: No results for input(s): "TSH", "T4TOTAL", "FREET4", "T3FREE", "THYROIDAB" in the last 72 hours. Anemia Panel: No results for input(s): "VITAMINB12", "FOLATE", "FERRITIN", "TIBC", "IRON", "RETICCTPCT" in the last 72 hours. Sepsis Labs: No results for input(s): "PROCALCITON", "LATICACIDVEN" in the last 168 hours.  No results found for this or any previous visit (from the past 240 hour(s)).       Radiology Studies: No results found.      Scheduled Meds:  carbidopa-levodopa  2 tablet Oral 5 X Daily   enoxaparin (LOVENOX) injection  40 mg Subcutaneous Q24H   feeding supplement  237 mL Oral TID BM   gabapentin  300 mg Oral TID   insulin aspart  0-9 Units Subcutaneous TID AC & HS   multivitamin with minerals  1 tablet Oral Daily   QUEtiapine  75 mg Oral QHS   rivastigmine  1.5 mg Oral BID    rOPINIRole  2 mg Oral 5 X Daily   sodium chloride flush  3 mL Intravenous Q12H   Continuous Infusions:   LOS: 16 days      Sidney Ace, MD [facility]   If 7PM-7AM, please contact night-coverage  06/02/2022, 10:15 AM

## 2022-06-02 NOTE — Progress Notes (Signed)
Patient found by another nurse in floor, unwitnessed fall. Dr. Priscella Mann paged and to bedside. Patient reported pain in neck and head. Patient found on left side, vitals taken while patient left side. When returned to bed vitals stable. New orders placed. No change in neurological status. Patient able to move all extremities.

## 2022-06-02 NOTE — TOC Progression Note (Signed)
Transition of Care [facility]) - Progression Note    Patient Details  Name: Randall [name] MRN: [id number] Date of Birth: October 16, 1950  Transition of Care [facility]) CM/SW [place], [place] Phone Number: 06/02/2022, 9:36 AM  Clinical Narrative:     CSW sent a text message to Andee Poles at [facility] to see if there was an update on long term care for pt, waiting on a response.    Expected Discharge Plan: [place] Barriers to Discharge: Continued Medical Work up  Expected Discharge Plan and Services Expected Discharge Plan: [place]: [place] arrangements for the past 2 months: Single Family Home                                       Social Determinants of Health (SDOH) Interventions Utilities Interventions: Intervention Not Indicated  Readmission Risk Interventions     No data to display

## 2022-06-02 NOTE — Progress Notes (Signed)
Was notified patient had fallen by 1C CN, [place].  On arrival to 1C saw patient laying on floor.  Complaining of head and neck pain. Dr. Priscella Mann had been made aware and arrived to pt room.Pt assisted to bed. Able to move all extremities.

## 2022-06-03 DIAGNOSIS — R627 Adult failure to thrive: Secondary | ICD-10-CM | POA: Diagnosis not present

## 2022-06-03 LAB — GLUCOSE, CAPILLARY
Glucose-Capillary: 200 mg/dL — ABNORMAL HIGH (ref 70–99)
Glucose-Capillary: 314 mg/dL — ABNORMAL HIGH (ref 70–99)
Glucose-Capillary: 198 mg/dL — ABNORMAL HIGH (ref 70–99)
Glucose-Capillary: 220 mg/dL — ABNORMAL HIGH (ref 70–99)

## 2022-06-03 NOTE — Progress Notes (Signed)
Physical Therapy Treatment Patient Details Name: Randall [name] MRN: [id number] DOB: September 10, 1950 Today's Date: 06/03/2022   History of Present Illness Pt is a 71 year old male admitted after being found down on floor for 12 hours, admitted with significant rhabdomyolysis; PMH significant for  hypertension, hyperlipidemia, diabetes mellitus type 2, Parkinson's disease with dementia. Pt required haldol on 05/19/21 2/2 agitation/sundowning.    PT Comments    Patient alert, garbled speech more pronounced this PM, and pt with flat affect. Difficult to understand his wishes this PM. Pt needed significantly more assistance for mobility (maxA-total) but unclear on limiting factor (weakness vs. Cognition vs. Willingness to participate). Ambulation deferred due to level of assistance needed. The patient would benefit from further skilled PT intervention to continue to progress towards goals. Recommendation remains appropriate.     Recommendations for follow up therapy are one component of a multi-disciplinary discharge planning process, led by the attending physician.  Recommendations may be updated based on patient status, additional functional criteria and insurance authorization.  Follow Up Recommendations  Skilled nursing-short term rehab (<3 hours/day) Can patient physically be transported by private vehicle: No   Assistance Recommended at Discharge Frequent or constant Supervision/Assistance  Patient can return home with the following Assistance with cooking/housework;Assist for transportation;Help with stairs or ramp for entrance;Direct supervision/assist for financial management;A lot of help with walking and/or transfers;A lot of help with bathing/dressing/bathroom   Equipment Recommendations  Rolling walker (2 wheels);BSC/3in1;Wheelchair (measurements PT);Wheelchair cushion (measurements PT)    Recommendations for Other Services       Precautions / Restrictions Precautions Precautions:  Fall Precaution Comments: Aspiration Restrictions Weight Bearing Restrictions: No     Mobility  Bed Mobility Overal bed mobility: Needs Assistance Bed Mobility: Supine to Sit, Sit to Supine     Supine to sit: Max assist Sit to supine: Total assist        Transfers Overall transfer level: Needs assistance Equipment used: Rolling walker (2 wheels) Transfers: Sit to/from Stand Sit to Stand: Total assist           General transfer comment: pt did not really push through BLE despite cueing and assistance with set up. totalA to come into standing and to remain standing    Ambulation/Gait               General Gait Details: deferred this PM due to level of assistance needed   Stairs             Wheelchair Mobility    Modified Rankin (Stroke Patients Only)       Balance Overall balance assessment: Needs assistance Sitting-balance support: Feet supported, Single extremity supported Sitting balance-Leahy Scale: Poor       Standing balance-Leahy Scale: Zero                              Cognition Arousal/Alertness: Awake/alert Behavior During Therapy: Flat affect Overall Cognitive Status: History of cognitive impairments - at baseline                                 General Comments: pt hard to understand, did not want to participate much with therapy this PM        Exercises      General Comments        Pertinent Vitals/Pain Pain Assessment Pain Assessment: Faces Faces Pain Scale: No hurt  Home Living                          Prior Function            PT Goals (current goals can now be found in the care plan section) Progress towards PT goals: Progressing toward goals    Frequency    Min 2X/week      PT Plan Current plan remains appropriate    Co-evaluation              AM-PAC PT "6 Clicks" Mobility   Outcome Measure  Help needed turning from your back to your side while  in a flat bed without using bedrails?: A Lot Help needed moving from lying on your back to sitting on the side of a flat bed without using bedrails?: A Lot Help needed moving to and from a bed to a chair (including a wheelchair)?: A Lot Help needed standing up from a chair using your arms (e.g., wheelchair or bedside chair)?: A Lot Help needed to walk in hospital room?: Total Help needed climbing 3-5 steps with a railing? : Total 6 Click Score: 10    End of Session Equipment Utilized During Treatment: Gait belt Activity Tolerance: Other (comment) (limited by cognition/willingness to participate) Patient left: in bed;with bed alarm set;with call bell/phone within reach Nurse Communication: Mobility status PT Visit Diagnosis: Unsteadiness on feet (R26.81);History of falling (Z91.81);Muscle weakness (generalized) (M62.81);Other abnormalities of gait and mobility (R26.89)     Time: 5366-4403 PT Time Calculation (min) (ACUTE ONLY): 10 min  Charges:  $Therapeutic Activity: 8-22 mins                    Lieutenant Diego PT, DPT 2:31 PM,06/03/22

## 2022-06-03 NOTE — Evaluation (Signed)
Occupational Therapy Re-Evaluation Patient Details Name: Randall [name] MRN: [id number] DOB: 1950/12/16 Today's Date: 06/03/2022   History of Present Illness Pt is a 71 year old male admitted after being found down on floor for 12 hours, admitted with significant rhabdomyolysis; PMH significant for  hypertension, hyperlipidemia, diabetes mellitus type 2, Parkinson's disease with dementia. Pt required haldol on 05/19/21 2/2 agitation/sundowning.   Clinical Impression   Patient seen for re-evaluation. Chart reviewed to date. Pt received resting in bed and agreeable to OT. Pt completed bed mobility with supervision, functional transfers with Mod A, and functional mobility to the sink with Min guard using RW. Pt stood at the sink to complete grooming tasks with set up A and Min guard for balance. He required multimodal cues for one step commands and sequencing of tasks. Pt left as received with all needs in reach. Safety measures in place. Reviewed goals and updated based on progress. Pt continues to benefit from skilled OT to maximize safety and independence.       Recommendations for follow up therapy are one component of a multi-disciplinary discharge planning process, led by the attending physician.  Recommendations may be updated based on patient status, additional functional criteria and insurance authorization.   Follow Up Recommendations  Skilled nursing-short term rehab (<3 hours/day)    Assistance Recommended at Discharge Frequent or constant Supervision/Assistance  Patient can return home with the following Assistance with cooking/housework;Direct supervision/assist for medications management;A lot of help with walking and/or transfers;A lot of help with bathing/dressing/bathroom;Direct supervision/assist for financial management;Assist for transportation;Help with stairs or ramp for entrance    Functional Status Assessment  Patient has had a recent decline in their functional status and  demonstrates the ability to make significant improvements in function in a reasonable and predictable amount of time.  Equipment Recommendations  Other (comment) (defer to next venue of care)    Recommendations for Other Services       Precautions / Restrictions Precautions Precautions: Fall Precaution Comments: Aspiration Restrictions Weight Bearing Restrictions: No      Mobility Bed Mobility Overal bed mobility: Needs Assistance Bed Mobility: Supine to Sit, Sit to Supine     Supine to sit: Supervision, HOB elevated Sit to supine: Supervision   General bed mobility comments: VC for sequencing and to scoot hips forward at EOB Patient Response: Cooperative  Transfers Overall transfer level: Needs assistance Equipment used: Rolling walker (2 wheels) Transfers: Sit to/from Stand Sit to Stand: Mod assist (to stand from lower bed surface)                  Balance Overall balance assessment: Needs assistance Sitting-balance support: Feet supported, Single extremity supported Sitting balance-Leahy Scale: Good Sitting balance - Comments: supervision static sitting   Standing balance support: Bilateral upper extremity supported, Single extremity supported, During functional activity   Standing balance comment: BUE support from RW during functional mobility, one UE supported during grooming tasks at sink with Min guard for balance                           ADL either performed or assessed with clinical judgement   ADL Overall ADL's : Needs assistance/impaired     Grooming: Wash/dry face;Set up;Supervision/safety;Sitting;Oral care Grooming Details (indicate cue type and reason): Completed while standing at the sink with Min guard for balance         Upper Body Dressing : Cueing for sequencing;Minimal assistance;Sitting   Lower Body  Dressing: Minimal assistance;Sitting/lateral leans;Cueing for sequencing               Functional mobility during  ADLs: Min guard;Rolling walker (2 wheels);Cueing for sequencing;Cueing for safety (for ~6 steps to/from sink)       Vision Baseline Vision/History: 1 Wears glasses Patient Visual Report: No change from baseline       Perception     Praxis      Pertinent Vitals/Pain Pain Assessment Pain Assessment: No/denies pain     Hand Dominance Right   Extremity/Trunk Assessment Upper Extremity Assessment Upper Extremity Assessment: Generalized weakness   Lower Extremity Assessment Lower Extremity Assessment: Generalized weakness   Cervical / Trunk Assessment Cervical / Trunk Assessment: Kyphotic   Communication Communication Communication: Expressive difficulties (mumbled speech)   Cognition Arousal/Alertness: Awake/alert Behavior During Therapy: Restless Overall Cognitive Status: History of cognitive impairments - at baseline Area of Impairment: Orientation, Attention, Following commands, Safety/judgement, Memory, Awareness, Problem solving                 Orientation Level: Disoriented to, Situation Current Attention Level: Sustained Memory: Decreased recall of precautions Following Commands: Follows one step commands with increased time Safety/Judgement: Decreased awareness of safety, Decreased awareness of deficits Awareness: Emergent Problem Solving: Slow processing, Decreased initiation, Difficulty sequencing, Requires verbal cues, Requires tactile cues General Comments: Followed one step commands with repetition and increased time. Required mulitmodal cues for all activities this date. Cooperative throughout.     General Comments       Exercises     Shoulder Instructions      Home Living Family/patient expects to be discharged to:: Private residence Living Arrangements: Spouse/significant other Available Help at Discharge: Family;Available 24 hours/day Type of Home: House Home Access: Ramped entrance     Home Layout: One level     Bathroom  Shower/Tub: Occupational psychologist: Standard Bathroom Accessibility: Yes   Home Equipment: Conservation officer, nature (2 wheels);Rollator (4 wheels)          Prior Functioning/Environment               Mobility Comments: amb with RW with MOD I per pt/wife report, falls in the last month ADLs Comments: Pt generally MOD I with ADl, will require assist from wife at times depending on "how is day is going" per wife        OT Problem List: Decreased strength;Decreased activity tolerance;Decreased coordination;Impaired balance (sitting and/or standing);Decreased cognition;Decreased knowledge of use of DME or AE;Decreased safety awareness      OT Treatment/Interventions: Self-care/ADL training;Patient/family education;Therapeutic exercise;Balance training;Energy conservation;Therapeutic activities;DME and/or AE instruction    OT Goals(Current goals can be found in the care plan section) Acute Rehab OT Goals Patient Stated Goal: to go home OT Goal Formulation: With patient Time For Goal Achievement: 06/17/22 Potential to Achieve Goals: Good  OT Frequency: Min 2X/week    Co-evaluation              AM-PAC OT "6 Clicks" Daily Activity     Outcome Measure Help from another person eating meals?: A Little Help from another person taking care of personal grooming?: A Little Help from another person toileting, which includes using toliet, bedpan, or urinal?: A Lot Help from another person bathing (including washing, rinsing, drying)?: A Lot Help from another person to put on and taking off regular upper body clothing?: A Little Help from another person to put on and taking off regular lower body clothing?: A Lot 6 Click Score: 15  End of Session Equipment Utilized During Treatment: Gait belt;Rolling walker (2 wheels) Nurse Communication: Mobility status  Activity Tolerance: Patient tolerated treatment well Patient left: in bed;with call bell/phone within reach;with chair alarm  set  OT Visit Diagnosis: Unsteadiness on feet (R26.81);Other abnormalities of gait and mobility (R26.89);History of falling (Z91.81)                Time: 1916-6060 OT Time Calculation (min): 13 min Charges:  OT General Charges $OT Visit: 1 Visit OT Evaluation $OT Re-eval: 1 Re-eval  [facility] MS, OTR/L ascom [phone]  06/03/22, 5:56 PM

## 2022-06-03 NOTE — Care Management Important Message (Signed)
Important Message  Patient Details  Name: Randall [name] MRN: [id number] Date of Birth: 1951/04/02   Medicare Important Message Given:  Yes     Dannette Barbara 06/03/2022, 11:17 AM

## 2022-06-03 NOTE — Progress Notes (Signed)
PROGRESS NOTE    HAWTHORNE DAY  KPT:[id number] DOB: [birth year] DOA: 05/17/2022 PCP: Adin Hector, MD    Brief Narrative:  71 y.o. male with medical history significant of hypertension, hyperlipidemia, diabetes mellitus type 2, Parkinson's disease with dementia presents after having a fall at home.  Patient was on the floor for about 12 hours.  By time he arrived in the hospital, he was found to have significant rhabdomyolysis with CK level of 8278.  Renal function still normal.  He was placed on IV fluids. Patient CK level was normalized on 9/12.  However, patient developed some confusion as well as dysphagia.  PT/OT recommended nursing home placement.     On 05/24/2022 urine very dark.  Patient given a fluid bolus and urine cleared up a little bit.  No blood in the urine.   On 05/25/2022.  Patient was having facial tremor and difficulty eating and taking meds.   05/26/2022 through 05/28/2022.  The patient has not been eating very much.  His urine is dark.  Bilirubin slightly high.  Previous urine analysis did not show any blood in the urine.  The patient wants to get out of the hospital.  On 05/28/2022 and his wife chose a bed at [facility] and insurance authorization is undergoing.  9/20: Received notification from [facility] that patient's insurance carrier has declined skilled nursing facility authorization.  Performed peer to peer with medical director at [facility] Dr. Coralie Carpen at [phone].  At this time denial is upheld.  9/22: Denial upheld through [place].  TOC engaged and attempting to assist with assisted living facility placement.   Assessment & Plan:   Principal Problem:   Failure to thrive in adult Active Problems:   Rhabdomyolysis   Acute delirium   Parkinson's disease ([place])   Fall at home, initial encounter   Leukocytosis   Hyponatremia   Type 2 diabetes mellitus with hyperlipidemia ([place])   Transaminitis   Hyperbilirubinemia   Dysphagia   Dementia with behavioral  disturbance (HCC)   Constipation   Weakness   Hypokalemia  Mechanical fall On 9/24 patient had day fall in the room.  No evidence of syncopal event.  I responded to bedside immediately.  Patient was able to move all his extremities.  Neurologic function at baseline.  Stat CT head negative for acute issues.  X-ray CTL spine negative for spinal fracture.  With some question of a potential 11th rib fracture however patient has no pain at the site. Plan: Fall precautions   * Failure to thrive in adult Patient having difficulty eating with Parkinson's and tremors.  Patient on a dysphagia 3 diet with thin liquids.  Appears to be eating more.  Energy level improving however expect this to be waxing and waning.  Patient remains a DNR.  Unfortunately [facility] declined skilled nursing facility placement.  TOC engaged and care patrol to assist with assisted living facility placement.  Continue to ambulate.  Therapy as tolerated.  Encourage p.o. intake.  Frequent reorienting measures   Rhabdomyolysis, resolved Traumatic rhabdomyolysis secondary to fall.  CPK 4735 on presentation down to 42.     Acute delirium Patient's mental status waxing and waning.  Occasional delirium noted with impulsiveness.  This morning status appears improved.  We will continue Seroquel at night.  Attempted minimize use of antipsychotics and benzodiazepines.  TOC assisting patient and family with assisted living facility placement.  Parkinson's disease ([place]) Continue Sinemet and Requip 5 times a day   Hyponatremia Now  resolved.  Sodium in reference range.  Suspect SIADH   Type 2 diabetes mellitus with hyperlipidemia (HCC) Last hemoglobin A1c 5.5.   Transaminitis AST and ALT normal range.  Total bilirubin up at 1.9 and indirect bilirubin up at 1.6.  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  Urine did not show any blood or bilirubin.   Hypokalemia Replaced   Weakness Physical therapy recommending rehab.    Constipation Abdominal x-ray does not show constipation.  Did have a small bowel movement.  Likely not eating enough.   Dementia with behavioral disturbance (HCC) Continue seroquel at night and exelon.  Haldol is not a good medication for parkinsons patients.   DVT prophylaxis: SQ Lovenox Code Status: DNR Family Communication: wife Constantin Hillery [phone] on 9/20.,  At bedside 9/22, via phone 9/24, at bedside 9/25 Disposition Plan: Status is: Inpatient Remains inpatient appropriate because: Unsafe discharge plan.  Insurance carrier has denied placement in skilled nursing facility.  Attempting to coordinate with TOC and patient's family to determine appropriate disposition plan.   Level of care: Med-Surg  Consultants:  Palliative care  Procedures:  None  Antimicrobials: None   Subjective: Patient seen and examined.  Speech more clear this morning.  Mentation appears clear.  Mentation waxes and wanes.  Objective: Vitals:   06/03/22 0300 06/03/22 0500 06/03/22 0626 06/03/22 0803  BP: 119/61 131/71 96/67 120/65  Pulse: 63 73 70   Resp: '17 16 19 18  '$ Temp: 97.7 F (36.5 C) 98 F (36.7 C) 98.3 F (36.8 C) 97.8 F (36.6 C)  TempSrc: Axillary Axillary Axillary Oral  SpO2: 99% 97% 95% 100%  Weight: 75.3 kg     Height:        Intake/Output Summary (Last 24 hours) at 06/03/2022 1030 Last data filed at 06/02/2022 2300 Gross per 24 hour  Intake 360 ml  Output 600 ml  Net -240 ml   Filed Weights   05/20/22 1527 05/21/22 0631 06/03/22 0300  Weight: 79 kg 76.7 kg 75.3 kg    Examination:  General exam: NAD Respiratory system: Clear.  Normal work of breathing.  Room air Cardiovascular system: S1-S2, RRR, no murmurs, no LE edema Gastrointestinal system: Soft thin, NT/ND, normal bowel sounds Central nervous system: Alert.  Oriented x2.  No focal deficits Extremities: Decreased power symmetrically.  Diffuse tremor Skin: No rashes, lesions or ulcers Psychiatry: Judgement  and insight appear impaired. Mood & affect flattened.     Data Reviewed: I have personally reviewed following labs and imaging studies  CBC: Recent Labs  Lab 05/29/22 0437 06/02/22 0821  WBC 9.1 8.5  NEUTROABS  --  7.1  HGB 13.0 13.7  HCT 40.1 42.0  MCV 93.0 91.7  PLT 346 381   Basic Metabolic Panel: Recent Labs  Lab 05/29/22 0437 05/30/22 1009 06/02/22 0821  NA 141 137 135  K 3.2* 3.7 4.4  CL 102 99 98  CO2 '29 29 27  '$ GLUCOSE 177* 339* 218*  BUN 25* 21 16  CREATININE 0.85 0.79 0.68  CALCIUM 9.4 9.4 9.0   GFR: Estimated Creatinine Clearance: 75.3 mL/min (by C-G formula based on SCr of 0.68 mg/dL). Liver Function Tests: Recent Labs  Lab 05/29/22 0437  AST 14*  ALT 9  ALKPHOS 68  BILITOT 1.7*  PROT 6.6  ALBUMIN 3.7   No results for input(s): "LIPASE", "AMYLASE" in the last 168 hours. No results for input(s): "AMMONIA" in the last 168 hours. Coagulation Profile: No results for input(s): "INR", "PROTIME" in the  last 168 hours. Cardiac Enzymes: No results for input(s): "CKTOTAL", "CKMB", "CKMBINDEX", "TROPONINI" in the last 168 hours.  BNP (last 3 results) No results for input(s): "PROBNP" in the last 8760 hours. HbA1C: No results for input(s): "HGBA1C" in the last 72 hours. CBG: Recent Labs  Lab 06/02/22 0758 06/02/22 1251 06/02/22 1627 06/02/22 2039 06/03/22 0744  GLUCAP 179* 309* 197* 151* 200*   Lipid Profile: No results for input(s): "CHOL", "HDL", "LDLCALC", "TRIG", "CHOLHDL", "LDLDIRECT" in the last 72 hours. Thyroid Function Tests: No results for input(s): "TSH", "T4TOTAL", "FREET4", "T3FREE", "THYROIDAB" in the last 72 hours. Anemia Panel: No results for input(s): "VITAMINB12", "FOLATE", "FERRITIN", "TIBC", "IRON", "RETICCTPCT" in the last 72 hours. Sepsis Labs: No results for input(s): "PROCALCITON", "LATICACIDVEN" in the last 168 hours.  No results found for this or any previous visit (from the past 240 hour(s)).       Radiology  Studies: DG Thoracic Spine 2 View  Result Date: 06/02/2022 CLINICAL DATA:  Back pain, fall EXAM: THORACIC SPINE 2 VIEWS COMPARISON:  01/29/2022 FINDINGS: There is no evidence of thoracic spine fracture. Alignment is normal. Disc heights are relatively well preserved. Possible nondisplaced fracture of the posterior left eleventh rib at the costovertebral junction. IMPRESSION: 1. No acute fracture or malalignment of the thoracic spine. 2. Possible fracture of the posterior left eleventh rib at the costovertebral junction. Correlate with point tenderness. Electronically Signed   By: Davina Poke D.O.   On: 06/02/2022 14:10   DG Lumbar Spine 2-3 Views  Result Date: 06/02/2022 CLINICAL DATA:  Back pain, fall abdominal aortic atherosclerosis. EXAM: LUMBAR SPINE - 2-3 VIEW COMPARISON:  None Available. FINDINGS: Five lumbar type vertebral segments. Vertebral body heights and alignment are maintained. No fracture identified. Intervertebral disc spaces are relatively preserved. Minimal degenerative endplate changes. Mild lower lumbar facet arthrosis. IMPRESSION: Negative. Electronically Signed   By: Davina Poke D.O.   On: 06/02/2022 14:07   DG Cervical Spine 2 or 3 views  Result Date: 06/02/2022 CLINICAL DATA:  Frequent falls. EXAM: CERVICAL SPINE - 2-3 VIEW COMPARISON:  None Available. FINDINGS: Obliqued lateral view, which excludes C6 and C7 limits evaluation. There is no evidence of cervical spine fracture or prevertebral soft tissue swelling. Alignment is normal. No other significant bone abnormalities are identified. IMPRESSION: Negative cervical spine radiographs. Electronically Signed   By: Fidela Salisbury M.D.   On: 06/02/2022 13:49   CT HEAD WO CONTRAST (5MM)  Result Date: 06/02/2022 CLINICAL DATA:  Head trauma.  Fall. EXAM: CT HEAD WITHOUT CONTRAST TECHNIQUE: Contiguous axial images were obtained from the base of the skull through the vertex without intravenous contrast. RADIATION DOSE  REDUCTION: This exam was performed according to the departmental dose-optimization program which includes automated exposure control, adjustment of the mA and/or kV according to patient size and/or use of iterative reconstruction technique. COMPARISON:  May 17, 2022 FINDINGS: Brain: No evidence of acute infarction, hemorrhage, hydrocephalus, extra-axial collection or mass lesion/mass effect. Vascular: No hyperdense vessel or unexpected calcification. Skull: Chronic changes at the right TMJ. Sinuses/Orbits: No acute finding. Other: None. IMPRESSION: 1. No acute intracranial abnormalities. 2. Chronic changes at the right TMJ. Electronically Signed   By: Dorise Bullion III M.D.   On: 06/02/2022 12:22        Scheduled Meds:  carbidopa-levodopa  2 tablet Oral 5 X Daily   enoxaparin (LOVENOX) injection  40 mg Subcutaneous Q24H   feeding supplement  237 mL Oral TID BM   gabapentin  300 mg Oral TID  insulin aspart  0-9 Units Subcutaneous TID AC & HS   multivitamin with minerals  1 tablet Oral Daily   QUEtiapine  75 mg Oral QHS   rivastigmine  1.5 mg Oral BID   rOPINIRole  2 mg Oral 5 X Daily   sodium chloride flush  3 mL Intravenous Q12H   Continuous Infusions:   LOS: 17 days      Sidney Ace, MD [facility]   If 7PM-7AM, please contact night-coverage  06/03/2022, 10:30 AM

## 2022-06-03 NOTE — TOC Progression Note (Signed)
Transition of Care [facility]) - Progression Note    Patient Details  Name: TALOR CHEEMA MRN: [id number] Date of Birth: 03-18-1951  [facility] CM/SW Contact  Laurena Slimmer, RN Phone Number: 06/03/2022, 12:02 PM  Clinical Narrative:    Spoke with patient's wife regarding discharge plans. Mrs.Dilks stated she is unable to private pay for ALF. She stated she had spoken with an insurance agent about an coding error that would make it possible for patient to admit to skill. Mrs.Laflam advised to contact [facility]. She was also informed a peer to peer had been completed and decision was not overturned. Patient was determined not to be skill able. She plan to contact the insurance company herself.   Spoke with [employer]. She attempted to reach Mrs. Saavedra today unsuccessfully. Patient wife was informed of her options to pay for LTC facilities. Patient does not have a secondary payor at this time. Patient would not qualify for ALF at this time unless it is private pay.    Expected Discharge Plan: [place] Barriers to Discharge: Continued Medical Work up  Expected Discharge Plan and Services Expected Discharge Plan: [place] Choice: [place] arrangements for the past 2 months: Single Family Home                                       Social Determinants of Health (SDOH) Interventions Utilities Interventions: Intervention Not Indicated  Readmission Risk Interventions     No data to display

## 2022-06-04 DIAGNOSIS — R627 Adult failure to thrive: Secondary | ICD-10-CM | POA: Diagnosis not present

## 2022-06-04 LAB — CBC WITH DIFFERENTIAL/PLATELET
Abs Immature Granulocytes: 0.03 10*3/uL (ref 0.00–0.07)
Basophils Absolute: 0 10*3/uL (ref 0.0–0.1)
Basophils Relative: 0 %
Eosinophils Absolute: 0.4 10*3/uL (ref 0.0–0.5)
Eosinophils Relative: 5 %
HCT: 41.5 % (ref 39.0–52.0)
Hemoglobin: 13.4 g/dL (ref 13.0–17.0)
Immature Granulocytes: 0 %
Lymphocytes Relative: 8 %
Lymphs Abs: 0.6 10*3/uL — ABNORMAL LOW (ref 0.7–4.0)
MCH: 29.8 pg (ref 26.0–34.0)
MCHC: 32.3 g/dL (ref 30.0–36.0)
MCV: 92.4 fL (ref 80.0–100.0)
Monocytes Absolute: 0.5 10*3/uL (ref 0.1–1.0)
Monocytes Relative: 6 %
Neutro Abs: 6.6 10*3/uL (ref 1.7–7.7)
Neutrophils Relative %: 81 %
Platelets: 381 10*3/uL (ref 150–400)
RBC: 4.49 MIL/uL (ref 4.22–5.81)
RDW: 13.4 % (ref 11.5–15.5)
WBC: 8.2 10*3/uL (ref 4.0–10.5)
nRBC: 0 % (ref 0.0–0.2)

## 2022-06-04 LAB — BASIC METABOLIC PANEL
Anion gap: 7 (ref 5–15)
BUN: 16 mg/dL (ref 8–23)
CO2: 29 mmol/L (ref 22–32)
Calcium: 9 mg/dL (ref 8.9–10.3)
Chloride: 99 mmol/L (ref 98–111)
Creatinine, Ser: 0.8 mg/dL (ref 0.61–1.24)
GFR, Estimated: >60 mL/min (ref >60)
Glucose, Bld: 240 mg/dL — ABNORMAL HIGH (ref 70–99)
Potassium: 4.1 mmol/L (ref 3.5–5.1)
Sodium: 135 mmol/L (ref 135–145)

## 2022-06-04 LAB — GLUCOSE, CAPILLARY
Glucose-Capillary: 217 mg/dL — ABNORMAL HIGH (ref 70–99)
Glucose-Capillary: 186 mg/dL — ABNORMAL HIGH (ref 70–99)
Glucose-Capillary: 227 mg/dL — ABNORMAL HIGH (ref 70–99)
Glucose-Capillary: 247 mg/dL — ABNORMAL HIGH (ref 70–99)

## 2022-06-04 LAB — MAGNESIUM: Magnesium: 2.1 mg/dL (ref 1.7–2.4)

## 2022-06-04 MED ORDER — LINAGLIPTIN 5 MG PO TABS
5.0000 mg | ORAL_TABLET | Freq: Every day | ORAL | Status: DC
  Administered 2022-06-04 – 2022-06-19 (×16): 5 mg via ORAL
  Filled 2022-06-04 (×16): qty 1

## 2022-06-04 NOTE — Progress Notes (Signed)
Physical Therapy Treatment Patient Details Name: Randall [name] MRN: [id number] DOB: 1951-07-08 Today's Date: 06/04/2022   History of Present Illness Pt is a 71 year old male admitted after being found down on floor for 12 hours, admitted with significant rhabdomyolysis; PMH significant for  hypertension, hyperlipidemia, diabetes mellitus type 2, Parkinson's disease with dementia. Pt required haldol on 05/19/21 2/2 agitation/sundowning.    PT Comments    Pt alert, does speak to PT throughout session but hard to understand due to garbled speech. Improved ability to follow commands and participation with mobility today. Pt still limited in function; ultimately 2+ assist for sit <> Stand (modAx2) as well as during ambulation min-modAx2 due to buckling noted, potentially pt fatigue, only ambulated ~50f. Returned to supine with all needs in reach. The patient would benefit from further skilled PT intervention to continue to progress towards goals as able. Recommendation remains appropriate.    Recommendations for follow up therapy are one component of a multi-disciplinary discharge planning process, led by the attending physician.  Recommendations may be updated based on patient status, additional functional criteria and insurance authorization.  Follow Up Recommendations  Skilled nursing-short term rehab (<3 hours/day) Can patient physically be transported by private vehicle: No   Assistance Recommended at Discharge Frequent or constant Supervision/Assistance  Patient can return home with the following Assistance with cooking/housework;Assist for transportation;Help with stairs or ramp for entrance;Direct supervision/assist for financial management;A lot of help with walking and/or transfers;A lot of help with bathing/dressing/bathroom   Equipment Recommendations  Rolling walker (2 wheels);BSC/3in1;Wheelchair (measurements PT);Wheelchair cushion (measurements PT)    Recommendations for Other  Services       Precautions / Restrictions Precautions Precautions: Fall Precaution Comments: Aspiration Restrictions Weight Bearing Restrictions: No     Mobility  Bed Mobility Overal bed mobility: Needs Assistance Bed Mobility: Supine to Sit, Sit to Supine     Supine to sit: Min assist Sit to supine: Min assist, +2 for safety/equipment        Transfers Overall transfer level: Needs assistance Equipment used: Rolling walker (2 wheels) Transfers: Sit to/from Stand Sit to Stand: Mod assist, +2 physical assistance                Ambulation/Gait Ambulation/Gait assistance: Mod assist, Min assist, +2 physical assistance Gait Distance (Feet): 70 Feet   Gait Pattern/deviations: Step-through pattern       General Gait Details: very shuffled step and then buckling noted, 2 person assist to ensure safety   Stairs             Wheelchair Mobility    Modified Rankin (Stroke Patients Only)       Balance Overall balance assessment: Needs assistance Sitting-balance support: Feet supported Sitting balance-Leahy Scale: Poor Sitting balance - Comments: eventually progressed to fair sitting balance, but initially poor due to constant R lateral lean   Standing balance support: Reliant on assistive device for balance, Bilateral upper extremity supported Standing balance-Leahy Scale: Poor Standing balance comment: posterior lean through most mobility today                            Cognition Arousal/Alertness: Awake/alert Behavior During Therapy: WFL for tasks assessed/performed Overall Cognitive Status: History of cognitive impairments - at baseline                                 General Comments: Followed  one step commands with repetition and increased time. Required mulitmodal cues for all activities this date.        Exercises      General Comments        Pertinent Vitals/Pain Pain Assessment Pain Assessment: No/denies  pain    Home Living                          Prior Function            PT Goals (current goals can now be found in the care plan section) Progress towards PT goals: Progressing toward goals;Not progressing toward goals - comment (limited progress noted, flucuating functional status)    Frequency    Min 2X/week      PT Plan Current plan remains appropriate    Co-evaluation              AM-PAC PT "6 Clicks" Mobility   Outcome Measure  Help needed turning from your back to your side while in a flat bed without using bedrails?: A Lot Help needed moving from lying on your back to sitting on the side of a flat bed without using bedrails?: A Lot Help needed moving to and from a bed to a chair (including a wheelchair)?: A Lot Help needed standing up from a chair using your arms (e.g., wheelchair or bedside chair)?: A Lot Help needed to walk in hospital room?: Total Help needed climbing 3-5 steps with a railing? : Total 6 Click Score: 10    End of Session Equipment Utilized During Treatment: Gait belt Activity Tolerance: Patient tolerated treatment well Patient left: in bed;with bed alarm set;with call bell/phone within reach Nurse Communication: Mobility status PT Visit Diagnosis: Unsteadiness on feet (R26.81);History of falling (Z91.81);Muscle weakness (generalized) (M62.81);Other abnormalities of gait and mobility (R26.89)     Time: 7416-3845 PT Time Calculation (min) (ACUTE ONLY): 19 min  Charges:  $Therapeutic Activity: 8-22 mins                     Lieutenant Diego PT, DPT 10:52 AM,06/04/22

## 2022-06-04 NOTE — Progress Notes (Signed)
PROGRESS NOTE    Randall [name]  LFY:[id number] DOB: 03/31/1951 DOA: 05/17/2022 PCP: Adin Hector, MD    Brief Narrative:  71 y.o. male with medical history significant of hypertension, hyperlipidemia, diabetes mellitus type 2, Parkinson's disease with dementia presents after having a fall at home.  Patient was on the floor for about 12 hours.  By time he arrived in the hospital, he was found to have significant rhabdomyolysis with CK level of 8278.  Renal function still normal.  He was placed on IV fluids. Patient CK level was normalized on 9/12.  However, patient developed some confusion as well as dysphagia.  PT/OT recommended nursing home placement.     On 05/24/2022 urine very dark.  Patient given a fluid bolus and urine cleared up a little bit.  No blood in the urine.   On 05/25/2022.  Patient was having facial tremor and difficulty eating and taking meds.   05/26/2022 through 05/28/2022.  The patient has not been eating very much.  His urine is dark.  Bilirubin slightly high.  Previous urine analysis did not show any blood in the urine.  The patient wants to get out of the hospital.  On 05/28/2022 and his wife chose a bed at [facility] and insurance authorization is undergoing.  9/20: Received notification from [facility] that patient's insurance carrier has declined skilled nursing facility authorization.  Performed peer to peer with medical director at [facility] Dr. Coralie Carpen at [phone].  At this time denial is upheld.  9/22: Denial upheld through [place].  TOC engaged and attempting to assist with assisted living facility placement.   Assessment & Plan:   Principal Problem:   Failure to thrive in adult Active Problems:   Rhabdomyolysis   Acute delirium   Parkinson's disease ([place])   Fall at home, initial encounter   Leukocytosis   Hyponatremia   Type 2 diabetes mellitus with hyperlipidemia ([place])   Transaminitis   Hyperbilirubinemia   Dysphagia   Dementia with behavioral  disturbance (HCC)   Constipation   Weakness   Hypokalemia  Mechanical fall On 9/24 patient had day fall in the room.  No evidence of syncopal event.  I responded to bedside immediately.  Patient was able to move all his extremities.  Neurologic function at baseline.  Stat CT head negative for acute issues.  X-ray CTL spine negative for spinal fracture.  With some question of a potential 11th rib fracture however patient has no pain at the site. Plan: Continue fall precautions   * Failure to thrive in adult Patient having difficulty eating with Parkinson's and tremors.  Patient on a dysphagia 3 diet with thin liquids.  Appears to be eating more.  Energy level improving however expect this to be waxing and waning.  Patient remains a DNR.  Unfortunately HDA declined skilled nursing facility placement.  TOC engaged and care patrol to assist with assisted living facility placement.  Continue to ambulate.  Therapy as tolerated.  Encourage p.o. intake.  Frequent reorienting measures.  - Patient would benefit from placement in long term care facility to minimize risk of falls and future hospitalizations   Rhabdomyolysis, resolved Traumatic rhabdomyolysis secondary to fall.  CPK 4735 on presentation down to 42.     Acute delirium Patient's mental status waxing and waning.  Occasional delirium noted with impulsiveness.  This morning status appears improved.  We will continue Seroquel at night.  Attempted minimize use of antipsychotics and benzodiazepines.  TOC assisting patient and family  with assisted living facility placement.  Parkinson's disease ([place]) Continue Sinemet and Requip 5 times a day   Hyponatremia Now resolved.  Sodium in reference range.  Suspect SIADH   Type 2 diabetes mellitus with hyperlipidemia (HCC) Last hemoglobin A1c 5.5.   Transaminitis AST and ALT normal range.  Total bilirubin up at 1.9 and indirect bilirubin up at 1.6.  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  Urine did not show any blood or bilirubin.   Hypokalemia Replaced   Weakness Physical therapy recommending rehab.   Constipation Abdominal x-ray does not show constipation.  Did have a small bowel movement.  Likely not eating enough.   Dementia with behavioral disturbance (HCC) Continue seroquel at night and exelon.  Haldol is not a good medication for parkinsons patients.   DVT prophylaxis: SQ Lovenox Code Status: DNR Family Communication: wife [name] [phone] on 9/20.,  At bedside 9/22, via phone 9/24, at bedside 9/25 Disposition Plan: Status is: Inpatient Remains inpatient appropriate because: Unsafe discharge plan.  Insurance carrier has denied placement in skilled nursing facility.  Attempting to coordinate with TOC and patient's family to determine appropriate disposition plan.   Level of care: Med-Surg  Consultants:  Palliative care  Procedures:  None  Antimicrobials: None   Subjective: Patient seen and examined.  Speech more clear this morning.  Mentating clearly today.  Objective: Vitals:   06/03/22 0803 06/03/22 1616 06/03/22 2102 06/04/22 0751  BP: 120/65 109/73 (!) 176/95 114/65  Pulse:  76 85 88  Resp: '18 16 16 17  '$ Temp: 97.8 F (36.6 C) 98.4 F (36.9 C) (!) 97.4 F (36.3 C) 97.7 F (36.5 C)  TempSrc: Oral     SpO2: 100% 100% 100% 99%  Weight:      Height:        Intake/Output Summary (Last 24 hours) at 06/04/2022 1014 Last data filed at 06/03/2022 1950 Gross per 24 hour  Intake --  Output 425 ml  Net -425 ml   Filed Weights   05/20/22 1527 05/21/22 0631 06/03/22 0300  Weight: 79 kg 76.7 kg 75.3 kg    Examination:  General exam: No acute distress Respiratory system: Clear.  Normal work of breathing.  Room air Cardiovascular system: S1-S2, RRR, no murmurs, no LE edema Gastrointestinal system: Soft thin, NT/ND, normal bowel sounds Central nervous system: Alert.  Oriented x2.  No focal deficits.  Dysarthric  speech Extremities: Decreased power symmetrically.  Diffuse tremor Skin: No rashes, lesions or ulcers Psychiatry: Judgement and insight appear impaired. Mood & affect flattened.     Data Reviewed: I have personally reviewed following labs and imaging studies  CBC: Recent Labs  Lab 05/29/22 0437 06/02/22 0821 06/04/22 0820  WBC 9.1 8.5 8.2  NEUTROABS  --  7.1 6.6  HGB 13.0 13.7 13.4  HCT 40.1 42.0 41.5  MCV 93.0 91.7 92.4  PLT 346 327 212   Basic Metabolic Panel: Recent Labs  Lab 05/29/22 0437 05/30/22 1009 06/02/22 0821 06/04/22 0820  NA 141 137 135 135  K 3.2* 3.7 4.4 4.1  CL 102 99 98 99  CO2 '29 29 27 29  '$ GLUCOSE 177* 339* 218* 240*  BUN 25* '21 16 16  '$ CREATININE 0.85 0.79 0.68 0.80  CALCIUM 9.4 9.4 9.0 9.0  MG  --   --   --  2.1   GFR: Estimated Creatinine Clearance: 75.3 mL/min (by C-G formula based on SCr of 0.8 mg/dL). Liver Function Tests: Recent Labs  Lab 05/29/22 [phone]  AST 14*  ALT 9  ALKPHOS 68  BILITOT 1.7*  PROT 6.6  ALBUMIN 3.7   No results for input(s): "LIPASE", "AMYLASE" in the last 168 hours. No results for input(s): "AMMONIA" in the last 168 hours. Coagulation Profile: No results for input(s): "INR", "PROTIME" in the last 168 hours. Cardiac Enzymes: No results for input(s): "CKTOTAL", "CKMB", "CKMBINDEX", "TROPONINI" in the last 168 hours.  BNP (last 3 results) No results for input(s): "PROBNP" in the last 8760 hours. HbA1C: No results for input(s): "HGBA1C" in the last 72 hours. CBG: Recent Labs  Lab 06/03/22 0744 06/03/22 1124 06/03/22 1610 06/03/22 1954 06/04/22 0753  GLUCAP 200* 314* 198* 220* 217*   Lipid Profile: No results for input(s): "CHOL", "HDL", "LDLCALC", "TRIG", "CHOLHDL", "LDLDIRECT" in the last 72 hours. Thyroid Function Tests: No results for input(s): "TSH", "T4TOTAL", "FREET4", "T3FREE", "THYROIDAB" in the last 72 hours. Anemia Panel: No results for input(s): "VITAMINB12", "FOLATE", "FERRITIN", "TIBC",  "IRON", "RETICCTPCT" in the last 72 hours. Sepsis Labs: No results for input(s): "PROCALCITON", "LATICACIDVEN" in the last 168 hours.  No results found for this or any previous visit (from the past 240 hour(s)).       Radiology Studies: DG Thoracic Spine 2 View  Result Date: 06/02/2022 CLINICAL DATA:  Back pain, fall EXAM: THORACIC SPINE 2 VIEWS COMPARISON:  01/29/2022 FINDINGS: There is no evidence of thoracic spine fracture. Alignment is normal. Disc heights are relatively well preserved. Possible nondisplaced fracture of the posterior left eleventh rib at the costovertebral junction. IMPRESSION: 1. No acute fracture or malalignment of the thoracic spine. 2. Possible fracture of the posterior left eleventh rib at the costovertebral junction. Correlate with point tenderness. Electronically Signed   By: Davina Poke D.O.   On: 06/02/2022 14:10   DG Lumbar Spine 2-3 Views  Result Date: 06/02/2022 CLINICAL DATA:  Back pain, fall abdominal aortic atherosclerosis. EXAM: LUMBAR SPINE - 2-3 VIEW COMPARISON:  None Available. FINDINGS: Five lumbar type vertebral segments. Vertebral body heights and alignment are maintained. No fracture identified. Intervertebral disc spaces are relatively preserved. Minimal degenerative endplate changes. Mild lower lumbar facet arthrosis. IMPRESSION: Negative. Electronically Signed   By: Davina Poke D.O.   On: 06/02/2022 14:07   DG Cervical Spine 2 or 3 views  Result Date: 06/02/2022 CLINICAL DATA:  Frequent falls. EXAM: CERVICAL SPINE - 2-3 VIEW COMPARISON:  None Available. FINDINGS: Obliqued lateral view, which excludes C6 and C7 limits evaluation. There is no evidence of cervical spine fracture or prevertebral soft tissue swelling. Alignment is normal. No other significant bone abnormalities are identified. IMPRESSION: Negative cervical spine radiographs. Electronically Signed   By: Fidela Salisbury M.D.   On: 06/02/2022 13:49   CT HEAD WO CONTRAST  (5MM)  Result Date: 06/02/2022 CLINICAL DATA:  Head trauma.  Fall. EXAM: CT HEAD WITHOUT CONTRAST TECHNIQUE: Contiguous axial images were obtained from the base of the skull through the vertex without intravenous contrast. RADIATION DOSE REDUCTION: This exam was performed according to the departmental dose-optimization program which includes automated exposure control, adjustment of the mA and/or kV according to patient size and/or use of iterative reconstruction technique. COMPARISON:  May 17, 2022 FINDINGS: Brain: No evidence of acute infarction, hemorrhage, hydrocephalus, extra-axial collection or mass lesion/mass effect. Vascular: No hyperdense vessel or unexpected calcification. Skull: Chronic changes at the right TMJ. Sinuses/Orbits: No acute finding. Other: None. IMPRESSION: 1. No acute intracranial abnormalities. 2. Chronic changes at the right TMJ. Electronically Signed   By: Dorise Bullion III M.D.  On: 06/02/2022 12:22        Scheduled Meds:  carbidopa-levodopa  2 tablet Oral 5 X Daily   enoxaparin (LOVENOX) injection  40 mg Subcutaneous Q24H   feeding supplement  237 mL Oral TID BM   gabapentin  300 mg Oral TID   insulin aspart  0-9 Units Subcutaneous TID AC & HS   linagliptin  5 mg Oral Daily   multivitamin with minerals  1 tablet Oral Daily   QUEtiapine  75 mg Oral QHS   rivastigmine  1.5 mg Oral BID   rOPINIRole  2 mg Oral 5 X Daily   sodium chloride flush  3 mL Intravenous Q12H   Continuous Infusions:   LOS: 18 days      Sidney Ace, MD [facility]   If 7PM-7AM, please contact night-coverage  06/04/2022, 10:14 AM

## 2022-06-04 NOTE — Inpatient Diabetes Management (Signed)
Inpatient Diabetes Program Recommendations  AACE/ADA: New Consensus Statement on Inpatient Glycemic Control (2015)  Target Ranges:  Prepandial:   less than 140 mg/dL      Peak postprandial:   less than 180 mg/dL (1-2 hours)      Critically ill patients:  140 - 180 mg/dL   Lab Results  Component Value Date   GLUCAP 217 (H) 06/04/2022   HGBA1C 5.5 05/17/2022    Review of Glycemic Control  Latest Reference Range & Units 06/03/22 16:10 06/03/22 19:54 06/04/22 07:53  Glucose-Capillary 70 - 99 mg/dL 198 (H) 220 (H) 217 (H)   Diabetes history: DM 2 Outpatient Diabetes medications:  Amaryl 4 mg bid, Metformin 500 mg bid Current orders for Inpatient glycemic control:  Novolog 0-9 units tid with meals Inpatient Diabetes Program Recommendations:    Note increased post-prandial blood sugars.  Consider adding DPP-4 such as Tradjenta 5 mg daily (reduced risk for hypoglycemia)?  Thanks,  Adah Perl, RN, BC-ADM Inpatient Diabetes Coordinator Pager [phone]  (8a-5p)

## 2022-06-05 ENCOUNTER — Encounter: Payer: Self-pay | Admitting: Internal Medicine

## 2022-06-05 DIAGNOSIS — R627 Adult failure to thrive: Secondary | ICD-10-CM | POA: Diagnosis not present

## 2022-06-05 DIAGNOSIS — T796XXS Traumatic ischemia of muscle, sequela: Secondary | ICD-10-CM | POA: Diagnosis not present

## 2022-06-05 DIAGNOSIS — F03918 Unspecified dementia, unspecified severity, with other behavioral disturbance: Secondary | ICD-10-CM | POA: Diagnosis not present

## 2022-06-05 DIAGNOSIS — R410 Disorientation, unspecified: Secondary | ICD-10-CM | POA: Diagnosis not present

## 2022-06-05 LAB — GLUCOSE, CAPILLARY
Glucose-Capillary: 224 mg/dL — ABNORMAL HIGH (ref 70–99)
Glucose-Capillary: 251 mg/dL — ABNORMAL HIGH (ref 70–99)
Glucose-Capillary: 244 mg/dL — ABNORMAL HIGH (ref 70–99)
Glucose-Capillary: 248 mg/dL — ABNORMAL HIGH (ref 70–99)

## 2022-06-05 MED ORDER — INSULIN GLARGINE-YFGN 100 UNIT/ML ~~LOC~~ SOLN
8.0000 [IU] | Freq: Every day | SUBCUTANEOUS | Status: DC
  Administered 2022-06-05 – 2022-06-06 (×2): 8 [IU] via SUBCUTANEOUS
  Filled 2022-06-05 (×3): qty 0.08

## 2022-06-05 NOTE — Plan of Care (Signed)

## 2022-06-05 NOTE — Progress Notes (Addendum)
Progress Note    Randall [name]  XBM:[id number] DOB: Jan 08, 1951  DOA: 05/17/2022 PCP: Adin Hector, MD      Brief Narrative:    Medical records reviewed and are as summarized below:  Randall [name] is a 71 y.o. Randall with medical history significant of hypertension, hyperlipidemia, diabetes mellitus type 2, Parkinson's disease with dementia presents after having a fall at home.  Patient was on the floor for about 12 hours.  By time he arrived in the hospital, he was found to have significant rhabdomyolysis with CK level of 8278.  Renal function still normal.  He was placed on IV fluids. Patient CK level was normalized on 9/12.  However, patient developed some confusion as well as dysphagia.  PT/OT recommended nursing home placement.     On 05/24/2022 urine very dark.  Patient given a fluid bolus and urine cleared up a little bit.  No blood in the urine.   On 05/25/2022.  Patient was having facial tremor and difficulty eating and taking meds.   05/26/2022 through 05/28/2022.  The patient has not been eating very much.  His urine is dark.  Bilirubin slightly high.  Previous urine analysis did not show any blood in the urine.  The patient wants to get out of the hospital.  On 05/28/2022 and his wife chose a bed at [facility] and insurance authorization is undergoing.   9/20: Received notification from [facility] that patient's insurance carrier has declined skilled nursing facility authorization.  Peer to peer with medical director at [facility] (Dr. Coralie Carpen at [phone]) was done by Dr. Priscella Mann.  Denial was upheld.   9/22: Denial upheld through [place].  TOC engaged and attempting to assist with assisted living facility placement.   9/27: Insurance authorization for SNF denied again per Donny Pique, Tourist information centre manager.        Assessment/Plan:   Principal Problem:   Failure to thrive in adult Active Problems:   Rhabdomyolysis   Acute delirium   Parkinson's disease ([place])   Fall at home,  initial encounter   Leukocytosis   Hyponatremia   Type 2 diabetes mellitus with hyperlipidemia (HCC)   Transaminitis   Hyperbilirubinemia   Dysphagia   Dementia with behavioral disturbance (HCC)   Constipation   Weakness   Hypokalemia   Nutrition Problem: Inadequate oral intake Etiology: acute illness  Signs/Symptoms: per patient/family report   Body mass index is 26.79 kg/m.   * Failure to thrive in adult with mechanical fall, generalized weakness Reportedly fell on 06/02/2022 but without any injuries. Continue fall precautions.  PT and OT recommended discharge to SNF but discharge to SNF was not approved by his insurance company. His wife would have to look for alternatives, likely private caregivers at home    Rhabdomyolysis Resolved   Acute delirium Waxing and waning mental state.  Continue Seroquel   Parkinson's disease ([place]) New Requip and Sinemet   Type 2 diabetes mellitus with hyperglycemia Start Semglee 8 units qhs. NovoLog as needed for hyperglycemia.  Last hemoglobin A1c 5.5 on 06/04/2022.   Transaminitis AST and ALT normal range.  Total bilirubin up at 1.9 and indirect bilirubin up at 1.6.  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  Urine did not show any blood or bilirubin.   Hypokalemia, hyponatremia Resolved  Constipation Laxatives as needed   Dementia with behavioral disturbance (HCC) Continue psychotropics     Diet Order  DIET DYS 3 Room service appropriate? Yes; Fluid consistency: Thin  Diet effective now                            Consultants: Palliative care  Procedures: None    Medications:    carbidopa-levodopa  2 tablet Oral 5 X Daily   enoxaparin (LOVENOX) injection  40 mg Subcutaneous Q24H   feeding supplement  237 mL Oral TID BM   gabapentin  300 mg Oral TID   insulin aspart  0-9 Units Subcutaneous TID AC & HS   linagliptin  5 mg Oral Daily   multivitamin with minerals  1 tablet  Oral Daily   QUEtiapine  75 mg Oral QHS   rivastigmine  1.5 mg Oral BID   rOPINIRole  2 mg Oral 5 X Daily   sodium chloride flush  3 mL Intravenous Q12H   Continuous Infusions:   Anti-infectives (From admission, onward)    None              Family Communication/Anticipated D/C date and plan/Code Status   DVT prophylaxis: Place and maintain sequential compression device Start: 05/20/22 1140 enoxaparin (LOVENOX) injection 40 mg Start: 05/17/22 2200     Code Status: DNR  Family Communication: Called his wife at 6:30 pm but there was no response. No option to leave a voice message.  Disposition Plan: To discharge home with home health   Status is: Inpatient Remains inpatient appropriate because: No safe discharge plan at the moment       Subjective:   Interval events noted.  OT and mobility technician were at the bedside.  Objective:    Vitals:   06/04/22 2019 06/05/22 0532 06/05/22 0736 06/05/22 1556  BP: (!) 159/92 128/85 99/67 122/72  Pulse: 82 87 78 80  Resp: '18 18 16 16  '$ Temp: 98.7 F (37.1 C) 97.7 F (36.5 C) 97.6 F (36.4 C) 98.4 F (36.9 C)  TempSrc:   Oral   SpO2: 100% 100% 95% 99%  Weight:      Height:       No data found.   Intake/Output Summary (Last 24 hours) at 06/05/2022 1804 Last data filed at 06/05/2022 1300 Gross per 24 hour  Intake 660 ml  Output 1400 ml  Net -740 ml   Filed Weights   05/20/22 1527 05/21/22 0631 06/03/22 0300  Weight: 79 kg 76.7 kg 75.3 kg    Exam:  GEN: NAD SKIN: No rash EYES: EOMI ENT: MMM CV: RRR PULM: CTA B ABD: soft, ND, NT, +BS CNS: Alert and oriented to person, non focal EXT: No edema or tenderness        Data Reviewed:   I have personally reviewed following labs and imaging studies:  Labs: Labs show the following:   Basic Metabolic Panel: Recent Labs  Lab 05/30/22 1009 06/02/22 0821 06/04/22 0820  NA 137 135 135  K 3.7 4.4 4.1  CL 99 98 99  CO2 '29 27 29  '$ GLUCOSE 339*  218* 240*  BUN '21 16 16  '$ CREATININE 0.79 0.68 0.80  CALCIUM 9.4 9.0 9.0  MG  --   --  2.1   GFR Estimated Creatinine Clearance: 75.3 mL/min (by C-G formula based on SCr of 0.8 mg/dL). Liver Function Tests: No results for input(s): "AST", "ALT", "ALKPHOS", "BILITOT", "PROT", "ALBUMIN" in the last 168 hours. No results for input(s): "LIPASE", "AMYLASE" in the last 168 hours. No results for input(s): "AMMONIA" in  the last 168 hours. Coagulation profile No results for input(s): "INR", "PROTIME" in the last 168 hours.  CBC: Recent Labs  Lab 06/02/22 0821 06/04/22 0820  WBC 8.5 8.2  NEUTROABS 7.1 6.6  HGB 13.7 13.4  HCT 42.0 41.5  MCV 91.7 92.4  PLT 327 381   Cardiac Enzymes: No results for input(s): "CKTOTAL", "CKMB", "CKMBINDEX", "TROPONINI" in the last 168 hours. BNP (last 3 results) No results for input(s): "PROBNP" in the last 8760 hours. CBG: Recent Labs  Lab 06/04/22 1544 06/04/22 2112 06/05/22 0754 06/05/22 1246 06/05/22 1559  GLUCAP 227* 247* 224* 251* 244*   D-Dimer: No results for input(s): "DDIMER" in the last 72 hours. Hgb A1c: No results for input(s): "HGBA1C" in the last 72 hours. Lipid Profile: No results for input(s): "CHOL", "HDL", "LDLCALC", "TRIG", "CHOLHDL", "LDLDIRECT" in the last 72 hours. Thyroid function studies: No results for input(s): "TSH", "T4TOTAL", "T3FREE", "THYROIDAB" in the last 72 hours.  Invalid input(s): "FREET3" Anemia work up: No results for input(s): "VITAMINB12", "FOLATE", "FERRITIN", "TIBC", "IRON", "RETICCTPCT" in the last 72 hours. Sepsis Labs: Recent Labs  Lab 06/02/22 0821 06/04/22 0820  WBC 8.5 8.2    Microbiology No results found for this or any previous visit (from the past 240 hour(s)).  Procedures and diagnostic studies:  No results found.             LOS: 19 days   [place] Copywriter, advertising on [email or web address]. If 7PM-7AM, please contact night-coverage at  [email or web address]     06/05/2022, 6:04 PM

## 2022-06-05 NOTE — Progress Notes (Signed)
Occupational Therapy Treatment Patient Details Name: Randall [name] MRN: [id number] DOB: [birth year] Today's Date: 06/05/2022   History of present illness Pt is a 71 year old male admitted after being found down on floor for 12 hours, admitted with significant rhabdomyolysis; PMH significant for  hypertension, hyperlipidemia, diabetes mellitus type 2, Parkinson's disease with dementia. Pt required haldol on 05/19/21 2/2 agitation/sundowning.   OT comments  Upon entering session, pt semi-reclined in bed completing self-feeding. Pt required increased physical assistance this date with +2 for functional transfers. Pt with R lateral lean while sitting EOB requiring Min-Mod A to maintain sitting balance. Pt with posterior lean in standing requiring Min-Mod A +2 to maintain standing balance and take steps toward BSC. Pt required multimodal cues for safety awareness and following one step commands. Pt left as received with assistance provided for repositioning to prevent R lateral lean and maintain midline position. All needs in reach and safety measures in place. Pt is making progress toward goal completion. D/C recommendation remains appropriate. OT will continue to follow acutely.      Recommendations for follow up therapy are one component of a multi-disciplinary discharge planning process, led by the attending physician.  Recommendations may be updated based on patient status, additional functional criteria and insurance authorization.    Follow Up Recommendations  Skilled nursing-short term rehab (<3 hours/day)    Assistance Recommended at Discharge Frequent or constant Supervision/Assistance  Patient can return home with the following  Assistance with cooking/housework;Direct supervision/assist for medications management;A lot of help with walking and/or transfers;A lot of help with bathing/dressing/bathroom;Direct supervision/assist for financial management;Assist for transportation;Help with stairs or  ramp for entrance   Equipment Recommendations  Other (comment) (defer to next venue of care)    Recommendations for Other Services      Precautions / Restrictions Precautions Precautions: Fall Precaution Comments: Aspiration Restrictions Weight Bearing Restrictions: No       Mobility Bed Mobility   Bed Mobility: Supine to Sit, Sit to Supine     Supine to sit: Total assist Sit to supine: Max assist, +2 for physical assistance   General bed mobility comments: Pt required assistance to scoot hips forward at EOB. Max VC for sequencing, however, pt unable to follow through stating, "my body just isn't working today".    Transfers Overall transfer level: Needs assistance Equipment used: Rolling walker (2 wheels) Transfers: Sit to/from Stand Sit to Stand: Min assist, +2 physical assistance           General transfer comment: Pt demonstrated difficulty pushing up from transfer surface, requiring Min A +2 to come into standing position. VC to maintain upright posture, posterior lean noted.     Balance   Sitting-balance support: Feet supported, Bilateral upper extremity supported Sitting balance-Leahy Scale: Poor Sitting balance - Comments: Min-Mod A +1 to maintain static sitting balance at EOB Postural control: Posterior lean, Right lateral lean Standing balance support: Reliant on assistive device for balance, Bilateral upper extremity supported Standing balance-Leahy Scale: Poor                             ADL either performed or assessed with clinical judgement   ADL Overall ADL's : Needs assistance/impaired                         Toilet Transfer: Rolling walker (2 wheels);+2 for physical assistance;Minimal assistance;BSC/3in1;Moderate assistance;Cueing for safety;Cueing for sequencing;Ambulation Toilet Transfer Details (  indicate cue type and reason): Pt required variable assistance for [facility] transfer. Pt had difficulty taking steps and required  assistance for weight shifting to L/R side in order to turn and sit on BSC. Min A for RW management and constant VC for sequencing/safety. Toileting- Clothing Manipulation and Hygiene: Maximal assistance;Sit to/from stand       Functional mobility during ADLs: Minimal assistance;+2 for physical assistance;Rolling walker (2 wheels);Cueing for safety;Cueing for sequencing;Moderate assistance (for RW management and to weight shift to take steps toward [facility])      Extremity/Trunk Assessment Upper Extremity Assessment Upper Extremity Assessment: Generalized weakness   Lower Extremity Assessment Lower Extremity Assessment: Generalized weakness   Cervical / Trunk Assessment Cervical / Trunk Assessment: Kyphotic    Vision Baseline Vision/History: 1 Wears glasses Patient Visual Report: No change from baseline     Perception     Praxis      Cognition Arousal/Alertness: Awake/alert Behavior During Therapy: WFL for tasks assessed/performed Overall Cognitive Status: History of cognitive impairments - at baseline Area of Impairment: Orientation, Attention, Following commands, Safety/judgement, Memory, Awareness, Problem solving                 Orientation Level: Disoriented to, Situation, Time Current Attention Level: Sustained Memory: Decreased recall of precautions Following Commands: Follows one step commands with increased time, Follows one step commands inconsistently Safety/Judgement: Decreased awareness of safety, Decreased awareness of deficits Awareness: Emergent Problem Solving: Slow processing, Decreased initiation, Difficulty sequencing, Requires verbal cues, Requires tactile cues General Comments: Followed one step commands with repetition and increased time. Required mulitmodal cues for all activities. Difficulty understanding at times due to mumbled speech.        Exercises      Shoulder Instructions       General Comments Pt experiencing tremors with R>L     Pertinent Vitals/ Pain       Pain Assessment Pain Assessment: No/denies pain  Home Living                                          Prior Functioning/Environment              Frequency  Min 2X/week        Progress Toward Goals  OT Goals(current goals can now be found in the care plan section)  Progress towards OT goals: Progressing toward goals  Acute Rehab OT Goals Patient Stated Goal: to go home OT Goal Formulation: With patient Time For Goal Achievement: 06/17/22 Potential to Achieve Goals: Good  Plan Frequency remains appropriate;Discharge plan remains appropriate    Co-evaluation                 AM-PAC OT "6 Clicks" Daily Activity     Outcome Measure   Help from another person eating meals?: A Little Help from another person taking care of personal grooming?: A Little Help from another person toileting, which includes using toliet, bedpan, or urinal?: A Lot Help from another person bathing (including washing, rinsing, drying)?: A Lot Help from another person to put on and taking off regular upper body clothing?: A Little Help from another person to put on and taking off regular lower body clothing?: A Lot 6 Click Score: 15    End of Session Equipment Utilized During Treatment: Gait belt;Rolling walker (2 wheels)  OT Visit Diagnosis: Unsteadiness on feet (R26.81);Other abnormalities of gait and mobility (  R26.89);History of falling (Z91.81)   Activity Tolerance Patient tolerated treatment well;Patient limited by fatigue   Patient Left in bed;with call bell/phone within reach;with chair alarm set (MD present in room)   Nurse Communication Mobility status        Time: 5848-3507 OT Time Calculation (min): 26 min  Charges: OT General Charges $OT Visit: 1 Visit OT Treatments $Self Care/Home Management : 23-37 mins  [facility] MS, OTR/L ascom [phone]  06/05/22, 11:58 AM

## 2022-06-05 NOTE — TOC Progression Note (Addendum)
Transition of Care [facility]) - Progression Note    Patient Details  Name: EPHRIAM TURMAN MRN: [id number] Date of Birth: [birth year]  [facility]) CM/SW Contact  Laurena Slimmer, RN Phone Number: 06/05/2022, 10:32 AM  Clinical Narrative:    Spoke with [employer] Supervisor,Crystal @ [phone] regarding patient's denial. Patient wife has been advised coding for auth was submitted correctly. Patient clinical resubmitted for review since last determination was five days ago. Patient auth for [facility] was approved and remains valid auth # [id number]. Lorie Apley for SNF initiated by Crystal.  On call [facility] nurse can be contacted tomorrow if needed @ [phone].   Attempt to reach patient's wife in room and by phone unsuccessfully.    Expected Discharge Plan: [place] Barriers to Discharge: Continued Medical Work up  Expected Discharge Plan and Services Expected Discharge Plan: [place] Choice: [place] arrangements for the past 2 months: Single Family Home                                       Social Determinants of Health (SDOH) Interventions Utilities Interventions: Intervention Not Indicated  Readmission Risk Interventions     No data to display

## 2022-06-05 NOTE — Progress Notes (Signed)
Nutrition Follow-up  DOCUMENTATION CODES:   Not applicable  INTERVENTION:   -Continue Magic cup TID with meals, each supplement provides 290 kcal and 9 grams of protein  -Continue MVI with minerals daily -Continue dysphagia 3 diet  -Continue Ensure Enlive po TID, each supplement provides 350 kcal and 20 grams of protein  NUTRITION DIAGNOSIS:   Inadequate oral intake related to acute illness as evidenced by per patient/family report.  Ongoing  GOAL:   Patient will meet greater than or equal to 90% of their needs  Progressing   MONITOR:   PO intake, Supplement acceptance, Labs, Weight trends, Skin, I & O's  REASON FOR ASSESSMENT:   Malnutrition Screening Tool    ASSESSMENT:   71 y.o. male with medical history significant of hypertension, hyperlipidemia, OSA, depression, diabetes mellitus type 2 and Parkinson's disease with dementia who is admitted with traumatic rhabdomyolysis secondary to fall.  Reviewed I/O's: -900 ml x 24 hours and -2.9 L since 05/22/22  UOP: 900 ml x 24 hours   Pt remains on a dysphagia 3 diet. Noted meal completions 5-60%. Pt typically accepting 2 Ensure supplements per day. Pt with poor oral intake and would benefit from nutrient dense supplement. One Ensure Enlive supplement provides 350 kcals, 20 grams protein, and 44-45 grams of carbohydrate vs one Glucerna shake supplement, which provides 220 kcals, 10 grams of protein, and 26 grams of carbohydrate. Given pt's hx of DM, RD will reassess adequacy of PO intake, CBGS, and adjust supplement regimen as appropriate at follow-up.    Pt awaiting insurance authorization for SNF per TOC notes.   Palliative care following for goals of care; pt does not desire feeding tube.   Wt has been stable since admission.   Medications reviewed and include sinemet  Labs reviewed: CBGS: 186-247 (inpatient orders for glycemic control are 0-9 units insulin aspart before meals and at bedtime and 5 mg linagliptin  daily).    Diet Order:   Diet Order             DIET DYS 3 Room service appropriate? Yes; Fluid consistency: Thin  Diet effective now                   EDUCATION NEEDS:   Education needs have been addressed  Skin:  Skin Assessment: Reviewed RN Assessment  Last BM:  06/02/22  Height:   Ht Readings from Last 1 Encounters:  05/17/22 '5\' 6"'$  (1.676 m)    Weight:   Wt Readings from Last 1 Encounters:  06/03/22 75.3 kg    Ideal Body Weight:  59 kg  BMI:  Body mass index is 26.79 kg/m.  Estimated Nutritional Needs:   Kcal:  1800-2100kcal/day  Protein:  90-105g/day  Fluid:  1.7-2.9L/day    Loistine Chance, RD, LDN, [place] Registered Dietitian II Certified Diabetes Care and Education Specialist Please refer to [facility] for RD and/or RD on-call/weekend/after hours pager

## 2022-06-05 NOTE — TOC Progression Note (Signed)
Transition of Care [facility]) - Progression Note    Patient Details  Name: Randall [name] MRN: [id number] Date of Birth: [birth year]  Transition of Care [facility]) CM/SW Contact  Laurena Slimmer, RN Phone Number: 06/05/2022, 10:53 AM  Clinical Narrative:    Retrieved a message from Marlowe Kays, [place] for HTA.  2nd clinical review completed. Patient denied for SNF. Patient was determined to be at baseline. Level of care is determined no skilled need was determined. For peer to peer MD can contact Dr. Lynder Parents @ [phone].   Contacted patient's wife to advised 2nd clincal review had been completed since patient's fall. Advised SNF was not approved. Advised HH could be arranged. Patient wife informed about PCA services. She is aware services would be private pay. Patient wife encouraged to apply for LTC medicaid via DSS. Patient is currently active with [facility].    Expected Discharge Plan: [place] Barriers to Discharge: Continued Medical Work up  Expected Discharge Plan and Services Expected Discharge Plan: [place] Choice: [place] arrangements for the past 2 months: Single Family Home                                       Social Determinants of Health (SDOH) Interventions Utilities Interventions: Intervention Not Indicated  Readmission Risk Interventions     No data to display

## 2022-06-06 DIAGNOSIS — R410 Disorientation, unspecified: Secondary | ICD-10-CM | POA: Diagnosis not present

## 2022-06-06 DIAGNOSIS — W19XXXA Unspecified fall, initial encounter: Secondary | ICD-10-CM | POA: Diagnosis not present

## 2022-06-06 DIAGNOSIS — F03918 Unspecified dementia, unspecified severity, with other behavioral disturbance: Secondary | ICD-10-CM | POA: Diagnosis not present

## 2022-06-06 DIAGNOSIS — R627 Adult failure to thrive: Secondary | ICD-10-CM | POA: Diagnosis not present

## 2022-06-06 LAB — GLUCOSE, CAPILLARY
Glucose-Capillary: 210 mg/dL — ABNORMAL HIGH (ref 70–99)
Glucose-Capillary: 176 mg/dL — ABNORMAL HIGH (ref 70–99)
Glucose-Capillary: 266 mg/dL — ABNORMAL HIGH (ref 70–99)
Glucose-Capillary: 257 mg/dL — ABNORMAL HIGH (ref 70–99)

## 2022-06-06 NOTE — TOC Progression Note (Addendum)
Transition of Care [facility]) - Progression Note    Patient Details  Name: Randall [name] MRN: [id number] Date of Birth: 12/07/1950  Transition of Care [facility]) CM/SW Contact  Laurena Slimmer, RN Phone Number: 06/06/2022, 1:14 PM  Clinical Narrative:    Spoke with patient's wife, [name] at length. She feels patient should qualify for short term rehab just because he has medicare. Patients wife advised patient has medicare but still has to qualify for SNF. Advised two authorizations had been denied because the patient does not meet SNF LOC. [name] advised of her denial letter and rights. She was advised this information would be left in the patient's room. Patient's is requesting a fast appeal. Patient advised if appeal is not overturned once discharge order is entered patient may incur a bill. [name] again strongly advised to apply for LTC MCD for placement in ALF or for LTC.    MD notified of fast appeal number [phone] and that fast appeals must be initiated within 72 hours of denials.   Expected Discharge Plan: [place] Barriers to Discharge: Continued Medical Work up  Expected Discharge Plan and Services Expected Discharge Plan: [place] Choice: [place] arrangements for the past 2 months: Single Family Home                                       Social Determinants of Health (SDOH) Interventions Utilities Interventions: Intervention Not Indicated  Readmission Risk Interventions     No data to display

## 2022-06-06 NOTE — Progress Notes (Signed)
Physical Therapy Treatment Patient Details Name: Randall [name] MRN: [id number] DOB: [birth year] Today's Date: 06/06/2022   History of Present Illness Pt is a 71 year old male admitted after being found down on floor for 12 hours, admitted with significant rhabdomyolysis; PMH significant for  hypertension, hyperlipidemia, diabetes mellitus type 2, Parkinson's disease with dementia. Pt required haldol on 05/19/21 2/2 agitation/sundowning.    PT Comments    Patient is agreeable to PT session. He was seated in the chair on arrival to room. The patient required Max A for multiple standing bouts from chair. Standing balance was poor with focus on pre-gait activity. Activity tolerance limited by fatigue. Recommend to continue PT to maximize independence. SNF recommended at this time.    Recommendations for follow up therapy are one component of a multi-disciplinary discharge planning process, led by the attending physician.  Recommendations may be updated based on patient status, additional functional criteria and insurance authorization.  Follow Up Recommendations  Skilled nursing-short term rehab (<3 hours/day) Can patient physically be transported by private vehicle: No   Assistance Recommended at Discharge Frequent or constant Supervision/Assistance  Patient can return home with the following Assistance with cooking/housework;Assist for transportation;Help with stairs or ramp for entrance;Direct supervision/assist for financial management;A lot of help with walking and/or transfers;A lot of help with bathing/dressing/bathroom   Equipment Recommendations  Rolling walker (2 wheels);BSC/3in1;Wheelchair (measurements PT);Wheelchair cushion (measurements PT)    Recommendations for Other Services       Precautions / Restrictions Precautions Precautions: Fall Restrictions Weight Bearing Restrictions: No     Mobility  Bed Mobility               General bed mobility comments: not assessed  as patient sitting up on arrival and post session    Transfers Overall transfer level: Needs assistance Equipment used: Rolling walker (2 wheels) Transfers: Sit to/from Stand Sit to Stand: Max assist           General transfer comment: 3 standing bouts performed. verbal cues for hand placement with facilitation for anterior weight shifting.    Ambulation/Gait             Pre-gait activities: RW used for UE support in for static standing activity. facilitation for weight shifting with focus on maintaining midline standing balance. patient is generally unsteady and required external support to maintain standing balance. patient able to advance LLE forward/backwards x  5 reps and RLE forward/backwards x 5 reps. patient needs increased assistance with right side weight shifting.     Stairs             Wheelchair Mobility    Modified Rankin (Stroke Patients Only)       Balance Overall balance assessment: Needs assistance Sitting-balance support: Feet supported Sitting balance-Leahy Scale: Fair     Standing balance support: Bilateral upper extremity supported, Reliant on assistive device for balance Standing balance-Leahy Scale: Poor Standing balance comment: external support required to maintain standing balance                            Cognition Arousal/Alertness: Awake/alert Behavior During Therapy: WFL for tasks assessed/performed Overall Cognitive Status: History of cognitive impairments - at baseline                                 General Comments: difficult to understanding. he is able to follow single step  commands with increased time for processing. cooperative throughout        Exercises General Exercises - Lower Extremity Ankle Circles/Pumps: AROM, Strengthening, Both, 10 reps, Seated Long Arc Quad: AAROM, Strengthening, Both, 10 reps, Seated Hip Flexion/Marching: AROM, Strengthening, 10 reps, Seated Other  Exercises Other Exercises: verbal and visual cues for exercise technique    General Comments        Pertinent Vitals/Pain Pain Assessment Pain Assessment: No/denies pain    Home Living                          Prior Function            PT Goals (current goals can now be found in the care plan section) Acute Rehab PT Goals Patient Stated Goal: to get better PT Goal Formulation: With patient Time For Goal Achievement: 06/14/22 Potential to Achieve Goals: Good Progress towards PT goals: Progressing toward goals    Frequency    Min 2X/week      PT Plan Current plan remains appropriate    Co-evaluation              AM-PAC PT "6 Clicks" Mobility   Outcome Measure  Help needed turning from your back to your side while in a flat bed without using bedrails?: A Lot Help needed moving from lying on your back to sitting on the side of a flat bed without using bedrails?: A Lot Help needed moving to and from a bed to a chair (including a wheelchair)?: A Lot Help needed standing up from a chair using your arms (e.g., wheelchair or bedside chair)?: A Lot Help needed to walk in hospital room?: Total Help needed climbing 3-5 steps with a railing? : Total 6 Click Score: 10    End of Session Equipment Utilized During Treatment: Gait belt Activity Tolerance: Patient tolerated treatment well Patient left: with call bell/phone within reach;in chair;with chair alarm set Nurse Communication: Mobility status (discussed with nurse aide) PT Visit Diagnosis: Unsteadiness on feet (R26.81);History of falling (Z91.81);Muscle weakness (generalized) (M62.81);Other abnormalities of gait and mobility (R26.89)     Time: 7353-2992 PT Time Calculation (min) (ACUTE ONLY): 12 min  Charges:  $Therapeutic Activity: 8-22 mins                    Minna Merritts, PT, MPT   Percell Locus 06/06/2022, 12:33 PM

## 2022-06-06 NOTE — Care Management Important Message (Signed)
Important Message  Patient Details  Name: Randall [name] MRN: [id number] Date of Birth: 1951-03-26   Medicare Important Message Given:  Yes     Dannette Barbara 06/06/2022, 1:04 PM

## 2022-06-06 NOTE — Progress Notes (Addendum)
Progress Note    Randall [name]  STM:[id number] DOB: 07-15-1951  DOA: 05/17/2022 PCP: Adin Hector, MD      Brief Narrative:    Medical records reviewed and are as summarized below:  Randall [name] is a 71 y.o. male with medical history significant of hypertension, hyperlipidemia, diabetes mellitus type 2, Parkinson's disease with dementia presents after having a fall at home.  Patient was on the floor for about 12 hours.  By time he arrived in the hospital, he was found to have significant rhabdomyolysis with CK level of 8278.  Renal function still normal.  He was placed on IV fluids. Patient CK level was normalized on 9/12.  However, patient developed some confusion as well as dysphagia.  PT/OT recommended nursing home placement.     On 05/24/2022 urine very dark.  Patient given a fluid bolus and urine cleared up a little bit.  No blood in the urine.   On 05/25/2022.  Patient was having facial tremor and difficulty eating and taking meds.   05/26/2022 through 05/28/2022.  The patient has not been eating very much.  His urine is dark.  Bilirubin slightly high.  Previous urine analysis did not show any blood in the urine.  The patient wants to get out of the hospital.  On 05/28/2022 and his wife chose a bed at [place] and insurance authorization is undergoing.   9/20: Received notification from [facility] that patient's insurance carrier has declined skilled nursing facility authorization.  Peer to peer with medical director at [facility] (Dr. Coralie Carpen at [phone]) was done by Dr. Priscella Mann.  Denial was upheld.   9/22: Denial upheld through [place].  TOC engaged and attempting to assist with assisted living facility placement.   9/27: Insurance authorization for SNF denied again per Donny Pique, Tourist information centre manager.        Assessment/Plan:   Principal Problem:   Failure to thrive in adult Active Problems:   Rhabdomyolysis   Acute delirium   Parkinson's disease ([place])   Fall at home,  initial encounter   Leukocytosis   Hyponatremia   Type 2 diabetes mellitus with hyperlipidemia (HCC)   Transaminitis   Hyperbilirubinemia   Dysphagia   Dementia with behavioral disturbance (HCC)   Constipation   Weakness   Hypokalemia   Nutrition Problem: Inadequate oral intake Etiology: acute illness  Signs/Symptoms: per patient/family report   Body mass index is 26.79 kg/m.   * Failure to thrive in adult with mechanical fall, generalized weakness Reportedly fell on 06/02/2022 but without any injuries. Current fall precautions.  Continue PT and OT while inpatient.  PT recommended discharge to SNF but authorization to SNF has been denied twice.  Case was discussed with his wife.  She intends to appeal the decision again.  She believes that patient should be eligible for long-term care at the skilled nursing facility.  She has been advised to call insurance company as soon as possible to appeal because she has to do so within 72 hours of the denial.   Rhabdomyolysis Resolved   Acute delirium Waxing and waning mental state.  Continue Seroquel   Parkinson's disease ([place]) Continue ropinirole and Sinemet   Type 2 diabetes mellitus with hyperglycemia Continue Semglee 8 units nightly..  NovoLog as needed for hyperglycemia.  Last hemoglobin A1c 5.5 on 06/04/2022.   Transaminitis AST and ALT normal range.  Total bilirubin up at 1.9 and indirect bilirubin up at 1.6.  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  Urine did not show any blood or bilirubin.   Hypokalemia, hyponatremia Resolved  Constipation Laxatives as needed   Dementia with behavioral disturbance (HCC) Continue psychotropics     Diet Order             DIET DYS 3 Room service appropriate? Yes; Fluid consistency: Thin  Diet effective now                            Consultants: Palliative care  Procedures: None    Medications:    carbidopa-levodopa  2 tablet Oral 5 X Daily    enoxaparin (LOVENOX) injection  40 mg Subcutaneous Q24H   feeding supplement  237 mL Oral TID BM   gabapentin  300 mg Oral TID   insulin aspart  0-9 Units Subcutaneous TID AC & HS   insulin glargine-yfgn  8 Units Subcutaneous QHS   linagliptin  5 mg Oral Daily   multivitamin with minerals  1 tablet Oral Daily   QUEtiapine  75 mg Oral QHS   rivastigmine  1.5 mg Oral BID   rOPINIRole  2 mg Oral 5 X Daily   sodium chloride flush  3 mL Intravenous Q12H   Continuous Infusions:   Anti-infectives (From admission, onward)    None              Family Communication/Anticipated D/C date and plan/Code Status   DVT prophylaxis: Place and maintain sequential compression device Start: 05/20/22 1140 enoxaparin (LOVENOX) injection 40 mg Start: 05/17/22 2200     Code Status: DNR  Family Communication: Plan discussed with his wife over the phone Disposition Plan: To discharge home with home health   Status is: Inpatient Remains inpatient appropriate because: No safe discharge plan at the moment       Subjective:   Interval events noted.  He is confused and unable to provide any history at this time.  Objective:    Vitals:   06/05/22 1556 06/05/22 2023 06/06/22 0425 06/06/22 0744  BP: 122/72 (!) 149/87 (!) 144/83 100/70  Pulse: 80 86 82 79  Resp: '16 19 17 17  '$ Temp: 98.4 F (36.9 C) 98.4 F (36.9 C) 98.3 F (36.8 C) 98.5 F (36.9 C)  TempSrc:   Oral   SpO2: 99% 99% 99% 97%  Weight:      Height:       No data found.   Intake/Output Summary (Last 24 hours) at 06/06/2022 1408 Last data filed at 06/06/2022 1300 Gross per 24 hour  Intake 480 ml  Output --  Net 480 ml   Filed Weights   05/20/22 1527 05/21/22 0631 06/03/22 0300  Weight: 79 kg 76.7 kg 75.3 kg    Exam:  GEN: NAD SKIN: No rash EYES: EOMI ENT: MMM CV: RRR PULM: CTA B ABD: soft, ND, NT, +BS CNS: Alert but confused, non focal, resting tremor right hand EXT: No edema or tenderness          Data Reviewed:   I have personally reviewed following labs and imaging studies:  Labs: Labs show the following:   Basic Metabolic Panel: Recent Labs  Lab 06/02/22 0821 06/04/22 0820  NA 135 135  K 4.4 4.1  CL 98 99  CO2 27 29  GLUCOSE 218* 240*  BUN 16 16  CREATININE 0.68 0.80  CALCIUM 9.0 9.0  MG  --  2.1   GFR Estimated Creatinine Clearance: 75.3 mL/min (by C-G formula  based on SCr of 0.8 mg/dL). Liver Function Tests: No results for input(s): "AST", "ALT", "ALKPHOS", "BILITOT", "PROT", "ALBUMIN" in the last 168 hours. No results for input(s): "LIPASE", "AMYLASE" in the last 168 hours. No results for input(s): "AMMONIA" in the last 168 hours. Coagulation profile No results for input(s): "INR", "PROTIME" in the last 168 hours.  CBC: Recent Labs  Lab 06/02/22 0821 06/04/22 0820  WBC 8.5 8.2  NEUTROABS 7.1 6.6  HGB 13.7 13.4  HCT 42.0 41.5  MCV 91.7 92.4  PLT 327 381   Cardiac Enzymes: No results for input(s): "CKTOTAL", "CKMB", "CKMBINDEX", "TROPONINI" in the last 168 hours. BNP (last 3 results) No results for input(s): "PROBNP" in the last 8760 hours. CBG: Recent Labs  Lab 06/05/22 1246 06/05/22 1559 06/05/22 2024 06/06/22 0743 06/06/22 1147  GLUCAP 251* 244* 248* 210* 176*   D-Dimer: No results for input(s): "DDIMER" in the last 72 hours. Hgb A1c: No results for input(s): "HGBA1C" in the last 72 hours. Lipid Profile: No results for input(s): "CHOL", "HDL", "LDLCALC", "TRIG", "CHOLHDL", "LDLDIRECT" in the last 72 hours. Thyroid function studies: No results for input(s): "TSH", "T4TOTAL", "T3FREE", "THYROIDAB" in the last 72 hours.  Invalid input(s): "FREET3" Anemia work up: No results for input(s): "VITAMINB12", "FOLATE", "FERRITIN", "TIBC", "IRON", "RETICCTPCT" in the last 72 hours. Sepsis Labs: Recent Labs  Lab 06/02/22 0821 06/04/22 0820  WBC 8.5 8.2    Microbiology No results found for this or any previous visit (from the past  240 hour(s)).  Procedures and diagnostic studies:  No results found.             LOS: 20 days   [facility]   Pager on [email or web address]. If 7PM-7AM, please contact night-coverage at [email or web address]     06/06/2022, 2:08 PM

## 2022-06-07 DIAGNOSIS — E1169 Type 2 diabetes mellitus with other specified complication: Secondary | ICD-10-CM | POA: Diagnosis not present

## 2022-06-07 DIAGNOSIS — G20 Parkinson's disease: Secondary | ICD-10-CM | POA: Diagnosis not present

## 2022-06-07 DIAGNOSIS — R627 Adult failure to thrive: Secondary | ICD-10-CM | POA: Diagnosis not present

## 2022-06-07 DIAGNOSIS — R531 Weakness: Secondary | ICD-10-CM | POA: Diagnosis not present

## 2022-06-07 LAB — GLUCOSE, CAPILLARY
Glucose-Capillary: 233 mg/dL — ABNORMAL HIGH (ref 70–99)
Glucose-Capillary: 230 mg/dL — ABNORMAL HIGH (ref 70–99)
Glucose-Capillary: 245 mg/dL — ABNORMAL HIGH (ref 70–99)
Glucose-Capillary: 245 mg/dL — ABNORMAL HIGH (ref 70–99)
Glucose-Capillary: 251 mg/dL — ABNORMAL HIGH (ref 70–99)

## 2022-06-07 MED ORDER — INSULIN GLARGINE-YFGN 100 UNIT/ML ~~LOC~~ SOLN
12.0000 [IU] | Freq: Every day | SUBCUTANEOUS | Status: DC
  Administered 2022-06-07 – 2022-06-18 (×12): 12 [IU] via SUBCUTANEOUS
  Filled 2022-06-07 (×12): qty 0.12

## 2022-06-07 NOTE — Progress Notes (Signed)
Physical Therapy Treatment Patient Details Name: Randall [name] MRN: [id number] DOB: 07/08/1951 Today's Date: 06/07/2022   History of Present Illness Pt is a 71 year old male admitted after being found down on floor for 12 hours, admitted with significant rhabdomyolysis; PMH significant for  hypertension, hyperlipidemia, diabetes mellitus type 2, Parkinson's disease with dementia. Pt required haldol on 05/19/21 2/2 agitation/sundowning.    PT Comments    Patient alert, agreeable to mobility and a bit easier to understand today (more enunciation of words noted). He was able to perform supine to sit with supervision and extended time, fair sitting balance noted. Sit <> stand with minAx1-2 (needed minAx2 when returning to room due to fatigue). He ambulated ~139f with RW and nearly constant minA (pt veers to the L). Many instances of festination and freezing of gait present, 1 PT led rest break (close chair follow throughout). Of noted pt assessed for R visual fields, able to report all peripheral fields properly, and correct on all extinguishing trials on RUE and LUE. Returned to room with all needs in reach. The patient would benefit from further skilled PT intervention to continue to progress towards goals. Recommendation remains appropriate.       Recommendations for follow up therapy are one component of a multi-disciplinary discharge planning process, led by the attending physician.  Recommendations may be updated based on patient status, additional functional criteria and insurance authorization.  Follow Up Recommendations  Skilled nursing-short term rehab (<3 hours/day) Can patient physically be transported by private vehicle: No   Assistance Recommended at Discharge Frequent or constant Supervision/Assistance  Patient can return home with the following Assistance with cooking/housework;Assist for transportation;Help with stairs or ramp for entrance;Direct supervision/assist for financial  management;A lot of help with walking and/or transfers;A lot of help with bathing/dressing/bathroom   Equipment Recommendations  Rolling walker (2 wheels);BSC/3in1;Wheelchair (measurements PT);Wheelchair cushion (measurements PT)    Recommendations for Other Services       Precautions / Restrictions Precautions Precautions: Fall Precaution Comments: Aspiration Restrictions Weight Bearing Restrictions: No     Mobility  Bed Mobility Overal bed mobility: Needs Assistance Bed Mobility: Supine to Sit     Supine to sit: Min guard, HOB elevated     General bed mobility comments: extended time    Transfers Overall transfer level: Needs assistance Equipment used: Rolling walker (2 wheels) Transfers: Sit to/from Stand Sit to Stand: Min assist, +2 safety/equipment           General transfer comment: minA from EOB, minAx2 for safety from recliner    Ambulation/Gait Ambulation/Gait assistance: Min assist, Min guard Gait Distance (Feet): 180 Feet Assistive device: Rolling walker (2 wheels)         General Gait Details: festination and freezing of gait noted. nearly constant minA due to pt driving RW to the left.1 PT led rest break in sitting (close chair follow). with fatigue at end of ambulation needed minA due to LOB, [facility]  [facility]             Wheelchair Mobility    Modified Rankin (Stroke Patients Only)       Balance Overall balance assessment: Needs assistance Sitting-balance support: Feet supported, Single extremity supported Sitting balance-Leahy Scale: Fair Sitting balance - Comments: with time improved to fair sitting with varying UE support  Cognition Arousal/Alertness: Awake/alert Behavior During Therapy: WFL for tasks assessed/performed Overall Cognitive Status: History of cognitive impairments - at baseline                                 General  Comments: improvement in speech noted today, but still garbled        Exercises      General Comments        Pertinent Vitals/Pain Pain Assessment Pain Assessment: No/denies pain    Home Living                          Prior Function            PT Goals (current goals can now be found in the care plan section) Progress towards PT goals: Progressing toward goals (variable, challenged by parkinsons)    Frequency    Min 2X/week      PT Plan Current plan remains appropriate    Co-evaluation              AM-PAC PT "6 Clicks" Mobility   Outcome Measure  Help needed turning from your back to your side while in a flat bed without using bedrails?: A Lot Help needed moving from lying on your back to sitting on the side of a flat bed without using bedrails?: A Lot Help needed moving to and from a bed to a chair (including a wheelchair)?: A Lot Help needed standing up from a chair using your arms (e.g., wheelchair or bedside chair)?: A Lot Help needed to walk in hospital room?: A Lot Help needed climbing 3-5 steps with a railing? : Total 6 Click Score: 11    End of Session Equipment Utilized During Treatment: Gait belt Activity Tolerance: Patient tolerated treatment well Patient left: with call bell/phone within reach;in bed;with bed alarm set Nurse Communication: Mobility status PT Visit Diagnosis: Unsteadiness on feet (R26.81);History of falling (Z91.81);Muscle weakness (generalized) (M62.81);Other abnormalities of gait and mobility (R26.89)     Time: 1329-1350 PT Time Calculation (min) (ACUTE ONLY): 21 min  Charges:  $Therapeutic Activity: 8-22 mins                     Lieutenant Diego PT, DPT 2:20 PM,06/07/22

## 2022-06-07 NOTE — Progress Notes (Signed)
[facility]) Hospital Liaison note:  This patient is currently enrolled in [facility] outpatient-based Palliative Care. Will continue to follow for disposition.  Please call with any outpatient palliative questions or concerns.  Thank you, Lorelee Market, LPN [facility] Liaison [phone]

## 2022-06-07 NOTE — Progress Notes (Signed)
Occupational Therapy Treatment Patient Details Name: Randall [name] MRN: [id number] DOB: [birth year] Today's Date: 06/07/2022   History of present illness Pt is a 71 year old male admitted after being found down on floor for 12 hours, admitted with significant rhabdomyolysis; PMH significant for  hypertension, hyperlipidemia, diabetes mellitus type 2, Parkinson's disease with dementia. Pt required haldol on 05/19/21 2/2 agitation/sundowning.   OT comments  Patient in recliner, wife at bedside, and agreeable to OT services. Right lateral leans observed while seated in recliner. Patient required max A for transfers sit <>stand x3. Patient was able to ambulate ~10 ft , requiring VC and manual facilitation to take steps. Patient was also able to participate in anterior weight shifting exercises. Patient left in recliner, with chair alarm set, call bell in reach, and wife present.    Recommendations for follow up therapy are one component of a multi-disciplinary discharge planning process, led by the attending physician.  Recommendations may be updated based on patient status, additional functional criteria and insurance authorization.    Follow Up Recommendations  Skilled nursing-short term rehab (<3 hours/day)    Assistance Recommended at Discharge Frequent or constant Supervision/Assistance  Patient can return home with the following  Assistance with cooking/housework;Direct supervision/assist for medications management;A lot of help with walking and/or transfers;A lot of help with bathing/dressing/bathroom;Direct supervision/assist for financial management;Assist for transportation;Help with stairs or ramp for entrance   Equipment Recommendations  Other (comment) (Defer to next venue of care.)    Recommendations for Other Services      Precautions / Restrictions Precautions Precautions: Fall Precaution Comments: Aspiration Restrictions Weight Bearing Restrictions: No       Mobility Bed  Mobility               General bed mobility comments: Patient in recliner upon arrival.    Transfers Overall transfer level: Needs assistance Equipment used: Rolling walker (2 wheels) Transfers: Sit to/from Stand Sit to Stand: Max assist                 Balance Overall balance assessment: Needs assistance Sitting-balance support: Feet supported Sitting balance-Leahy Scale: Fair   Postural control: Posterior lean, Right lateral lean Standing balance support: Bilateral upper extremity supported, Reliant on assistive device for balance Standing balance-Leahy Scale: Poor Standing balance comment: external support required to maintain standing balance                           ADL either performed or assessed with clinical judgement   ADL                                              Extremity/Trunk Assessment Upper Extremity Assessment Upper Extremity Assessment: Generalized weakness   Lower Extremity Assessment Lower Extremity Assessment: Generalized weakness        Vision Baseline Vision/History: 1 Wears glasses Patient Visual Report: No change from baseline            Cognition Arousal/Alertness: Awake/alert Behavior During Therapy: WFL for tasks assessed/performed Overall Cognitive Status: History of cognitive impairments - at baseline  Pertinent Vitals/ Pain       Pain Assessment Pain Assessment: No/denies pain   Frequency  Min 2X/week        Progress Toward Goals  OT Goals(current goals can now be found in the care plan section)  Progress towards OT goals: Progressing toward goals  Acute Rehab OT Goals Patient Stated Goal: to return home. OT Goal Formulation: With patient/family Time For Goal Achievement: 06/17/22 Potential to Achieve Goals: Good  Plan Frequency remains appropriate;Discharge plan remains appropriate       AM-PAC  OT "6 Clicks" Daily Activity     Outcome Measure   Help from another person eating meals?: A Little Help from another person taking care of personal grooming?: A Little Help from another person toileting, which includes using toliet, bedpan, or urinal?: A Lot Help from another person bathing (including washing, rinsing, drying)?: A Lot Help from another person to put on and taking off regular upper body clothing?: A Little Help from another person to put on and taking off regular lower body clothing?: A Lot 6 Click Score: 15    End of Session Equipment Utilized During Treatment: Rolling walker (2 wheels)  OT Visit Diagnosis: Unsteadiness on feet (R26.81);Other abnormalities of gait and mobility (R26.89);History of falling (Z91.81)   Activity Tolerance Patient tolerated treatment well   Patient Left in chair;with call bell/phone within reach;with chair alarm set;with family/visitor present   Nurse Communication Mobility status        Time: 3704-8889 OT Time Calculation (min): 1429 min  Charges: OT General Charges $OT Visit: 1 Visit OT Treatments $Therapeutic Activity: 8-22 mins    Chrystle Murillo, OTS 06/07/2022, 1:42 PM

## 2022-06-07 NOTE — Inpatient Diabetes Management (Signed)
Inpatient Diabetes Program Recommendations  AACE/ADA: New Consensus Statement on Inpatient Glycemic Control (2015)  Target Ranges:  Prepandial:   less than 140 mg/dL      Peak postprandial:   less than 180 mg/dL (1-2 hours)      Critically ill patients:  140 - 180 mg/dL    Latest Reference Range & Units 06/06/22 07:43 06/06/22 11:47 06/06/22 16:10 06/06/22 20:17  Glucose-Capillary 70 - 99 mg/dL 210 (H)  3 units Novolog  176 (H)  2 units Novolog  266 (H)  5 units Novolog  257 (H)  5 units Novolog  8 units Semglee  (H): Data is abnormally high  Latest Reference Range & Units 06/07/22 08:46  Glucose-Capillary 70 - 99 mg/dL 230 (H)  3 units Novolog   (H): Data is abnormally high    Home DM Meds: Amaryl 4 mg BID     Metformin 500 mg BID   Current Orders: Semglee 8 units QHS Novolog 0-9 units TID ac/hs  Tradjenta 5 mg Daily    MD- Please consider:  1. Increase Semglee slightly to 10 units QHS  2. Increase Novolog SSI to the 0-15 unit scale   --Will follow patient during hospitalization--  Wyn Quaker RN, MSN, [place] Diabetes Coordinator Inpatient Glycemic Control Team Team Pager: [phone] (8a-5p)

## 2022-06-07 NOTE — Progress Notes (Addendum)
Mobility Specialist - Progress Note   06/07/22 0900  Mobility  Activity Transferred to/from [facility];Transferred from bed to chair  Level of Assistance Moderate assist, patient does 50-74%  Assistive Device Front wheel walker  Distance Ambulated (ft) 4 ft  Activity Response Tolerated well  $Mobility charge 1 Mobility     Pt sitting EOB upon arrival, utilizing RA. NT at bedside. Pt required modA and extra time for STS to RW. VC for hand placement, sequencing tasks, and lateral weight shifting. MaxA to SPT to R for transfer to [facility]. Unable to lift RLE against gravity when attempting to march in place; unsafe to progress gait at this time. Unsuccessful with BM; +2 for peri-care. Pt left in recliner with alarm set, needs in reach. Pillow wedge support to correct R lateral lean in sitting. Spouse at bedside.    Kathee Delton Mobility Specialist 06/07/22, 10:45 AM

## 2022-06-07 NOTE — Progress Notes (Addendum)
Progress Note    Randall [name]  WRU:[id number] DOB: [birth year]  DOA: 05/17/2022 PCP: Adin Hector, MD      Brief Narrative:    Medical records reviewed and are as summarized below:  Randall [name] is a 71 y.o. male with medical history significant of hypertension, hyperlipidemia, diabetes mellitus type 2, Parkinson's disease with dementia presents after having a fall at home.  Patient was on the floor for about 12 hours.  By time he arrived in the hospital, he was found to have significant rhabdomyolysis with CK level of 8278.  Renal function still normal.  He was placed on IV fluids. Patient CK level was normalized on 9/12.  However, patient developed some confusion as well as dysphagia.  PT/OT recommended nursing home placement.     On 05/24/2022 urine very dark.  Patient given a fluid bolus and urine cleared up a little bit.  No blood in the urine.   On 05/25/2022.  Patient was having facial tremor and difficulty eating and taking meds.   05/26/2022 through 05/28/2022.  The patient has not been eating very much.  His urine is dark.  Bilirubin slightly high.  Previous urine analysis did not show any blood in the urine.  The patient wants to get out of the hospital.  On 05/28/2022 and his wife chose a bed at [facility] and insurance authorization is undergoing.   9/20: Received notification from [facility] that patient's insurance carrier has declined skilled nursing facility authorization.  [facility] to peer with medical director at [facility] (Dr. Coralie Carpen at [phone]) was done by Dr. Priscella Mann.  Denial was upheld.   9/22: Denial upheld through [place].  TOC engaged and attempting to assist with assisted living facility placement.   9/27: Insurance authorization for SNF denied again per Donny Pique, Tourist information centre manager.        Assessment/Plan:   Principal Problem:   Failure to thrive in adult Active Problems:   Rhabdomyolysis   Acute delirium   Parkinson's disease ([place])   Fall at home,  initial encounter   Leukocytosis   Hyponatremia   Type 2 diabetes mellitus with hyperlipidemia (HCC)   Transaminitis   Hyperbilirubinemia   Dysphagia   Dementia with behavioral disturbance (HCC)   Constipation   Weakness   Hypokalemia   Nutrition Problem: Inadequate oral intake Etiology: acute illness  Signs/Symptoms: per patient/family report   Body mass index is 26.79 kg/m.   * Failure to thrive in adult with mechanical fall, generalized weakness Reportedly fell on 06/02/2022 but without any injuries. Continue fall precautions.  Continue PT and OT while inpatient.  PT recommended discharge to SNF but authorization to SNF has been denied twice.  His wife is still convinced that patient should be at least eligible for placement to a long-term care facility.  She plans to launch an appeal.  She has been advised to call the insurance company as soon as possible to avoid missing the deadline (up to 72 hours from date of denial).   Rhabdomyolysis Resolved   Acute delirium Waxing and waning mental state.  Continue Seroquel   Parkinson's disease ([place]) Continue ropinirole and Sinemet   Type 2 diabetes mellitus with hyperglycemia Increase Semglee to 12 units nightly.  NovoLog as needed for hyperglycemia.  Last hemoglobin A1c 5.5 on 06/04/2022.   Transaminitis Improved.  Repeat liver enzymes tomorrow.   Hypokalemia, hyponatremia Resolved  Constipation Laxatives as needed   Dementia with behavioral disturbance (HCC) Continue psychotropics  Diet Order             DIET DYS 3 Room service appropriate? Yes; Fluid consistency: Thin  Diet effective now                            Consultants: Palliative care  Procedures: None    Medications:    carbidopa-levodopa  2 tablet Oral 5 X Daily   enoxaparin (LOVENOX) injection  40 mg Subcutaneous Q24H   feeding supplement  237 mL Oral TID BM   gabapentin  300 mg Oral TID   insulin aspart  0-9 Units  Subcutaneous TID AC & HS   insulin glargine-yfgn  8 Units Subcutaneous QHS   linagliptin  5 mg Oral Daily   multivitamin with minerals  1 tablet Oral Daily   QUEtiapine  75 mg Oral QHS   rivastigmine  1.5 mg Oral BID   rOPINIRole  2 mg Oral 5 X Daily   sodium chloride flush  3 mL Intravenous Q12H   Continuous Infusions:   Anti-infectives (From admission, onward)    None              Family Communication/Anticipated D/C date and plan/Code Status   DVT prophylaxis: Place and maintain sequential compression device Start: 05/20/22 1140 enoxaparin (LOVENOX) injection 40 mg Start: 05/17/22 2200     Code Status: DNR  Family Communication: Plan discussed with his wife at the bedside Disposition Plan: To discharge home with home health   Status is: Inpatient Remains inpatient appropriate because: No safe discharge plan at the moment       Subjective:   Interval events noted.  He has no complaints.  He said he feels "pretty good".  His wife is at the bedside.  Objective:    Vitals:   06/06/22 1608 06/06/22 2020 06/07/22 0502 06/07/22 0849  BP: 109/64 132/81 133/83 125/67  Pulse: (!) 102 86 81 79  Resp: '16 17 18 16  '$ Temp: (!) 97.3 F (36.3 C) 97.9 F (36.6 C) 97.9 F (36.6 C) 98.4 F (36.9 C)  TempSrc:      SpO2: 98% 95% 100% 96%  Weight:      Height:       No data found.   Intake/Output Summary (Last 24 hours) at 06/07/2022 1327 Last data filed at 06/07/2022 1256 Gross per 24 hour  Intake 100 ml  Output 1100 ml  Net -1000 ml   Filed Weights   05/20/22 1527 05/21/22 0631 06/03/22 0300  Weight: 79 kg 76.7 kg 75.3 kg    Exam:  GEN: NAD SKIN: Warm and dry EYES: No pallor or icterus ENT: MMM CV: RRR PULM: CTA B ABD: soft, ND, NT, +BS CNS: Alert but confused EXT: No edema or tenderness          Data Reviewed:   I have personally reviewed following labs and imaging studies:  Labs: Labs show the following:   Basic Metabolic  Panel: Recent Labs  Lab 06/02/22 0821 06/04/22 0820  NA 135 135  K 4.4 4.1  CL 98 99  CO2 27 29  GLUCOSE 218* 240*  BUN 16 16  CREATININE 0.68 0.80  CALCIUM 9.0 9.0  MG  --  2.1   GFR Estimated Creatinine Clearance: 75.3 mL/min (by C-G formula based on SCr of 0.8 mg/dL). Liver Function Tests: No results for input(s): "AST", "ALT", "ALKPHOS", "BILITOT", "PROT", "ALBUMIN" in the last 168 hours. No results for input(s): "LIPASE", "  AMYLASE" in the last 168 hours. No results for input(s): "AMMONIA" in the last 168 hours. Coagulation profile No results for input(s): "INR", "PROTIME" in the last 168 hours.  CBC: Recent Labs  Lab 06/02/22 0821 06/04/22 0820  WBC 8.5 8.2  NEUTROABS 7.1 6.6  HGB 13.7 13.4  HCT 42.0 41.5  MCV 91.7 92.4  PLT 327 381   Cardiac Enzymes: No results for input(s): "CKTOTAL", "CKMB", "CKMBINDEX", "TROPONINI" in the last 168 hours. BNP (last 3 results) No results for input(s): "PROBNP" in the last 8760 hours. CBG: Recent Labs  Lab 06/06/22 1610 06/06/22 2017 06/07/22 0743 06/07/22 0846 06/07/22 1211  GLUCAP 266* 257* 233* 230* 245*   D-Dimer: No results for input(s): "DDIMER" in the last 72 hours. Hgb A1c: No results for input(s): "HGBA1C" in the last 72 hours. Lipid Profile: No results for input(s): "CHOL", "HDL", "LDLCALC", "TRIG", "CHOLHDL", "LDLDIRECT" in the last 72 hours. Thyroid function studies: No results for input(s): "TSH", "T4TOTAL", "T3FREE", "THYROIDAB" in the last 72 hours.  Invalid input(s): "FREET3" Anemia work up: No results for input(s): "VITAMINB12", "FOLATE", "FERRITIN", "TIBC", "IRON", "RETICCTPCT" in the last 72 hours. Sepsis Labs: Recent Labs  Lab 06/02/22 0821 06/04/22 0820  WBC 8.5 8.2    Microbiology No results found for this or any previous visit (from the past 240 hour(s)).  Procedures and diagnostic studies:  No results found.             LOS: 21 days   [place]  Copywriter, advertising on [email or web address]. If 7PM-7AM, please contact night-coverage at [email or web address]     06/07/2022, 1:27 PM

## 2022-06-07 NOTE — Progress Notes (Signed)
[facility])  [name] is our current outpatient palliative care patient. His family called [place] directly on Friday to see if we can meet with them on Saturday to discuss hospice options.   [facility] will meet with family @ 11 on Saturday to discuss hospice options.  Thank you, Venia Carbon DNP, RN [facility] Liaison

## 2022-06-07 NOTE — TOC Progression Note (Signed)
Transition of Care [facility]) - Progression Note    Patient Details  Name: Randall [name] MRN: [id number] Date of Birth: 11-24-1950  Transition of Care [facility]) CM/SW Contact  Laurena Slimmer, RN Phone Number: 06/07/2022, 11:38 AM  Clinical Narrative:    Spoke with patient's wife at bedside. Patient's wife is requesting and appeal. Patient's advised she can call and file the appeal. She is requesting the MD call but would like to speak with him prior to calling. She has been advised the fast appeal needs to be initiated within 72 hours of the denial letter being issued.   MD notified patient wife was requesting to speak with him.       Expected Discharge Plan: [place] Barriers to Discharge: Continued Medical Work up  Expected Discharge Plan and Services Expected Discharge Plan: [place] Choice: [place] arrangements for the past 2 months: Single Family Home                                       Social Determinants of Health (SDOH) Interventions Utilities Interventions: Intervention Not Indicated  Readmission Risk Interventions     No data to display

## 2022-06-08 DIAGNOSIS — R627 Adult failure to thrive: Secondary | ICD-10-CM | POA: Diagnosis not present

## 2022-06-08 DIAGNOSIS — R531 Weakness: Secondary | ICD-10-CM | POA: Diagnosis not present

## 2022-06-08 DIAGNOSIS — F03918 Unspecified dementia, unspecified severity, with other behavioral disturbance: Secondary | ICD-10-CM | POA: Diagnosis not present

## 2022-06-08 DIAGNOSIS — G20 Parkinson's disease: Secondary | ICD-10-CM | POA: Diagnosis not present

## 2022-06-08 LAB — GLUCOSE, CAPILLARY
Glucose-Capillary: 118 mg/dL — ABNORMAL HIGH (ref 70–99)
Glucose-Capillary: 256 mg/dL — ABNORMAL HIGH (ref 70–99)
Glucose-Capillary: 229 mg/dL — ABNORMAL HIGH (ref 70–99)
Glucose-Capillary: 212 mg/dL — ABNORMAL HIGH (ref 70–99)

## 2022-06-08 LAB — COMPREHENSIVE METABOLIC PANEL
ALT: 8 U/L (ref 0–44)
AST: 18 U/L (ref 15–41)
Albumin: 3.5 g/dL (ref 3.5–5.0)
Alkaline Phosphatase: 83 U/L (ref 38–126)
Anion gap: 7 (ref 5–15)
BUN: 20 mg/dL (ref 8–23)
CO2: 29 mmol/L (ref 22–32)
Calcium: 9 mg/dL (ref 8.9–10.3)
Chloride: 101 mmol/L (ref 98–111)
Creatinine, Ser: 0.9 mg/dL (ref 0.61–1.24)
GFR, Estimated: >60 mL/min (ref >60)
Glucose, Bld: 104 mg/dL — ABNORMAL HIGH (ref 70–99)
Potassium: 3.8 mmol/L (ref 3.5–5.1)
Sodium: 137 mmol/L (ref 135–145)
Total Bilirubin: 1.4 mg/dL — ABNORMAL HIGH (ref 0.3–1.2)
Total Protein: 6.3 g/dL — ABNORMAL LOW (ref 6.5–8.1)

## 2022-06-08 NOTE — TOC Progression Note (Addendum)
Transition of Care [facility]) - Progression Note    Patient Details  Name: Randall [name] MRN: [id number] Date of Birth: [birth year]  Transition of Care [facility]) CM/SW [place], LCSW Phone Number: 06/08/2022, 12:37 PM  Clinical Narrative:    Called [facility], spoke to [employer] who confirmed patient has the right to appeal the SNF denial. Colletta Maryland sent copy of denial letter via fax to this CSW. CSW spoke with patient, spouse, and relatives at bedside. Explained appeal rights. They requested to talk with PT, notified PT of request.   2:05- Spoke to family member Einar Pheasant [phone]) who stated they were able to speak with PT and patient's RN and got more clarity on their questions. The family does not feel patient's wife can meet patient's needs with him needing 2 person assistance and she recently has had her own health issues which limit her mobility. They are calling [facility] to appeal the SNF denial. Updated MD.   2:45- Call from spouse who stated she called the number to start an expedited appeal had a recording saying they only take calls Monday-Friday. CSW called Colletta Maryland and asked if there is another number that spouse can call, she stated it will be fine for spouse to call the number on the letter on Monday.    Expected Discharge Plan: [place] Barriers to Discharge: Continued Medical Work up  Expected Discharge Plan and Services Expected Discharge Plan: [place] Choice: [place] arrangements for the past 2 months: Single Family Home                                       Social Determinants of Health (SDOH) Interventions Utilities Interventions: Intervention Not Indicated  Readmission Risk Interventions     No data to display

## 2022-06-08 NOTE — Progress Notes (Signed)
[facility]) Hospice hospital liaison note  Liaison had family meeting with patient's wife, brother and niece. Discussed different settings where hospice care might be provided. Discussed parameters for hospice at home/facility vs IPU. Answered questions as able.   Family requested to speak with TOC and this was relayed to her.   [place] liaison will continue to follow for discharge disposition and remain available for questions.   Thank you for the opportunity to participate in this patient's care Jhonnie Garner, BSN, RN Hospice hospital liaison [phone]

## 2022-06-08 NOTE — Progress Notes (Signed)
Progress Note    Randall [name]  BWL:[id number] DOB: 1951-06-18  DOA: 05/17/2022 PCP: Adin Hector, MD      Brief Narrative:    Medical records reviewed and are as summarized below:  Randall [name] is a 71 y.o. male with medical history significant of hypertension, hyperlipidemia, diabetes mellitus type 2, Parkinson's disease with dementia presents after having a fall at home.  Patient was on the floor for about 12 hours.  By time he arrived in the hospital, he was found to have significant rhabdomyolysis with CK level of 8278.  Renal function still normal.  He was placed on IV fluids. Patient CK level was normalized on 9/12.  However, patient developed some confusion as well as dysphagia.  PT/OT recommended nursing home placement.     On 05/24/2022 urine very dark.  Patient given a fluid bolus and urine cleared up a little bit.  No blood in the urine.   On 05/25/2022.  Patient was having facial tremor and difficulty eating and taking meds.   05/26/2022 through 05/28/2022.  The patient has not been eating very much.  His urine is dark.  Bilirubin slightly high.  Previous urine analysis did not show any blood in the urine.  The patient wants to get out of the hospital.  On 05/28/2022 and his wife chose a bed at [facility] and insurance authorization is undergoing.   9/20: Received notification from [facility] that patient's insurance carrier has declined skilled nursing facility authorization.  Peer to peer with medical director at [facility] (Dr. Coralie Carpen at [phone]) was done by Dr. Priscella Mann.  Denial was upheld.   9/22: Denial upheld through [place].  TOC engaged and attempting to assist with assisted living facility placement.   9/27: Insurance authorization for SNF denied again per Donny Pique, Tourist information centre manager.        Assessment/Plan:   Principal Problem:   Failure to thrive in adult Active Problems:   Rhabdomyolysis   Acute delirium   Parkinson's disease ([place])   Fall at home,  initial encounter   Leukocytosis   Hyponatremia   Type 2 diabetes mellitus with hyperlipidemia (HCC)   Transaminitis   Hyperbilirubinemia   Dysphagia   Dementia with behavioral disturbance (HCC)   Constipation   Weakness   Hypokalemia   Nutrition Problem: Inadequate oral intake Etiology: acute illness  Signs/Symptoms: per patient/family report   Body mass index is 26.79 kg/m.   * Failure to thrive in adult with mechanical fall, generalized weakness Reportedly fell on 06/02/2022 but without any injuries. Continue fall precautions.  Continue PT and OT while inpatient.  PT recommended discharge to SNF but authorization to SNF has been denied twice.  His wife is appealing SNF denial.   Rhabdomyolysis Resolved   Acute delirium Waxing and waning mental state.  Continue Seroquel   Parkinson's disease ([place]) Continue ropinirole and Sinemet   Type 2 diabetes mellitus with hyperglycemia Continue Semglee 12 units nightly.  NovoLog as needed for hyperglycemia.  Last hemoglobin A1c 5.5 on 06/04/2022.   Elevated liver enzymes Improved.  AST, ALT and ALP are normal.  Bilirubin is almost normal at 1.4   Hypokalemia, hyponatremia Resolved  Constipation Laxatives as needed   Dementia with behavioral disturbance (HCC) Continue psychotropics     Diet Order             DIET DYS 3 Room service appropriate? Yes; Fluid consistency: Thin  Diet effective now  Consultants: Palliative care  Procedures: None    Medications:    carbidopa-levodopa  2 tablet Oral 5 X Daily   enoxaparin (LOVENOX) injection  40 mg Subcutaneous Q24H   feeding supplement  237 mL Oral TID BM   gabapentin  300 mg Oral TID   insulin aspart  0-9 Units Subcutaneous TID AC & HS   insulin glargine-yfgn  12 Units Subcutaneous QHS   linagliptin  5 mg Oral Daily   multivitamin with minerals  1 tablet Oral Daily   QUEtiapine  75 mg Oral QHS   rivastigmine  1.5 mg  Oral BID   rOPINIRole  2 mg Oral 5 X Daily   sodium chloride flush  3 mL Intravenous Q12H   Continuous Infusions:   Anti-infectives (From admission, onward)    None              Family Communication/Anticipated D/C date and plan/Code Status   DVT prophylaxis: Place and maintain sequential compression device Start: 05/20/22 1140 enoxaparin (LOVENOX) injection 40 mg Start: 05/17/22 2200     Code Status: DNR  Family Communication: None Disposition Plan: To discharge home with home health   Status is: Inpatient Remains inpatient appropriate because: No safe discharge plan at the moment       Subjective:   Interval events noted.  He has no complaints.  Objective:    Vitals:   06/07/22 2037 06/07/22 2103 06/08/22 0514 06/08/22 0827  BP: (!) 86/50 103/61 122/68 (!) 145/84  Pulse: 73 69 72 80  Resp: '17  17 18  '$ Temp: 98.2 F (36.8 C)  97.7 F (36.5 C) 97.8 F (36.6 C)  TempSrc:      SpO2: 98%  96% 97%  Weight:      Height:       No data found.   Intake/Output Summary (Last 24 hours) at 06/08/2022 1332 Last data filed at 06/08/2022 0546 Gross per 24 hour  Intake 237 ml  Output 1600 ml  Net -1363 ml   Filed Weights   05/20/22 1527 05/21/22 0631 06/03/22 0300  Weight: 79 kg 76.7 kg 75.3 kg    Exam:  GEN: NAD SKIN: Warm and dry EYES: EOMI ENT: MMM CV: RRR PULM: CTA B ABD: soft, ND, NT, +BS CNS: Alert and oriented to person, self speech, non focal EXT: No edema or tenderness    Data Reviewed:   I have personally reviewed following labs and imaging studies:  Labs: Labs show the following:   Basic Metabolic Panel: Recent Labs  Lab 06/02/22 0821 06/04/22 0820 06/08/22 0736  NA 135 135 137  K 4.4 4.1 3.8  CL 98 99 101  CO2 '27 29 29  '$ GLUCOSE 218* 240* 104*  BUN '16 16 20  '$ CREATININE 0.68 0.80 0.90  CALCIUM 9.0 9.0 9.0  MG  --  2.1  --    GFR Estimated Creatinine Clearance: 67 mL/min (by C-G formula based on SCr of 0.9  mg/dL). Liver Function Tests: Recent Labs  Lab 06/08/22 0736  AST 18  ALT 8  ALKPHOS 83  BILITOT 1.4*  PROT 6.3*  ALBUMIN 3.5   No results for input(s): "LIPASE", "AMYLASE" in the last 168 hours. No results for input(s): "AMMONIA" in the last 168 hours. Coagulation profile No results for input(s): "INR", "PROTIME" in the last 168 hours.  CBC: Recent Labs  Lab 06/02/22 0821 06/04/22 0820  WBC 8.5 8.2  NEUTROABS 7.1 6.6  HGB 13.7 13.4  HCT 42.0 41.5  MCV 91.7  92.4  PLT 327 381   Cardiac Enzymes: No results for input(s): "CKTOTAL", "CKMB", "CKMBINDEX", "TROPONINI" in the last 168 hours. BNP (last 3 results) No results for input(s): "PROBNP" in the last 8760 hours. CBG: Recent Labs  Lab 06/07/22 1211 06/07/22 1745 06/07/22 2103 06/08/22 0828 06/08/22 1159  GLUCAP 245* 245* 251* 118* 256*   D-Dimer: No results for input(s): "DDIMER" in the last 72 hours. Hgb A1c: No results for input(s): "HGBA1C" in the last 72 hours. Lipid Profile: No results for input(s): "CHOL", "HDL", "LDLCALC", "TRIG", "CHOLHDL", "LDLDIRECT" in the last 72 hours. Thyroid function studies: No results for input(s): "TSH", "T4TOTAL", "T3FREE", "THYROIDAB" in the last 72 hours.  Invalid input(s): "FREET3" Anemia work up: No results for input(s): "VITAMINB12", "FOLATE", "FERRITIN", "TIBC", "IRON", "RETICCTPCT" in the last 72 hours. Sepsis Labs: Recent Labs  Lab 06/02/22 0821 06/04/22 0820  WBC 8.5 8.2    Microbiology No results found for this or any previous visit (from the past 240 hour(s)).  Procedures and diagnostic studies:  No results found.             LOS: 22 days   [place] Copywriter, advertising on [email or web address]. If 7PM-7AM, please contact night-coverage at [email or web address]     06/08/2022, 1:32 PM

## 2022-06-09 DIAGNOSIS — R627 Adult failure to thrive: Secondary | ICD-10-CM | POA: Diagnosis not present

## 2022-06-09 DIAGNOSIS — R531 Weakness: Secondary | ICD-10-CM | POA: Diagnosis not present

## 2022-06-09 DIAGNOSIS — W19XXXA Unspecified fall, initial encounter: Secondary | ICD-10-CM | POA: Diagnosis not present

## 2022-06-09 DIAGNOSIS — F03918 Unspecified dementia, unspecified severity, with other behavioral disturbance: Secondary | ICD-10-CM | POA: Diagnosis not present

## 2022-06-09 LAB — GLUCOSE, CAPILLARY
Glucose-Capillary: 169 mg/dL — ABNORMAL HIGH (ref 70–99)
Glucose-Capillary: 158 mg/dL — ABNORMAL HIGH (ref 70–99)
Glucose-Capillary: 225 mg/dL — ABNORMAL HIGH (ref 70–99)
Glucose-Capillary: 155 mg/dL — ABNORMAL HIGH (ref 70–99)

## 2022-06-09 MED ORDER — SIMETHICONE 80 MG PO CHEW: 80.0000 mg | CHEWABLE_TABLET | Freq: Four times a day (QID) | ORAL | Status: DC | PRN

## 2022-06-09 NOTE — Progress Notes (Signed)
Progress Note    Randall [name]  UXL:[id number] DOB: August 07, 1951  DOA: 05/17/2022 PCP: Adin Hector, MD      Brief Narrative:    Medical records reviewed and are as summarized below:  Randall [name] is a 71 y.o. male with medical history significant of hypertension, hyperlipidemia, diabetes mellitus type 2, Parkinson's disease with dementia presents after having a fall at home.  Patient was on the floor for about 12 hours.  By time he arrived in the hospital, he was found to have significant rhabdomyolysis with CK level of 8278.  Renal function still normal.  He was placed on IV fluids. Patient CK level was normalized on 9/12.  However, patient developed some confusion as well as dysphagia.  PT/OT recommended nursing home placement.     On 05/24/2022 urine very dark.  Patient given a fluid bolus and urine cleared up a little bit.  No blood in the urine.   On 05/25/2022.  Patient was having facial tremor and difficulty eating and taking meds.   05/26/2022 through 05/28/2022.  The patient has not been eating very much.  His urine is dark.  Bilirubin slightly high.  Previous urine analysis did not show any blood in the urine.  The patient wants to get out of the hospital.  On 05/28/2022 and his wife chose a bed at [facility] and insurance authorization is undergoing.   9/20: Received notification from [facility] that patient's insurance carrier has declined skilled nursing facility authorization.  Peer to peer with medical director at [facility] (Dr. Coralie Carpen at [phone]) was done by Dr. Priscella Mann.  Denial was upheld.   9/22: Denial upheld through [place].  TOC engaged and attempting to assist with assisted living facility placement.   9/27: Insurance authorization for SNF denied again per Donny Pique, Tourist information centre manager.        Assessment/Plan:   Principal Problem:   Failure to thrive in adult Active Problems:   Rhabdomyolysis   Acute delirium   Parkinson's disease   Fall at home, initial  encounter   Leukocytosis   Hyponatremia   Type 2 diabetes mellitus with hyperlipidemia (HCC)   Transaminitis   Hyperbilirubinemia   Dysphagia   Dementia with behavioral disturbance (HCC)   Constipation   Weakness   Hypokalemia   Nutrition Problem: Inadequate oral intake Etiology: acute illness  Signs/Symptoms: per patient/family report   Body mass index is 26.79 kg/m.   * Failure to thrive in adult with mechanical fall, generalized weakness Reportedly fell on 06/02/2022 but without any injuries. Continue fall precautions.  Continue PT and OT while inpatient.  PT recommended discharge to SNF but authorization to SNF has been denied twice.  His wife said she will appeal SNF denial. She is also talking to hospice to see what they can offer.   Acute delirium Waxing and waning mental state.  Continue Seroquel   Parkinson's disease ([place]) Continue ropinirole and Sinemet   Type 2 diabetes mellitus with hyperglycemia Continue Semglee 12 units nightly.  NovoLog as needed for hyperglycemia.  Last hemoglobin A1c 5.5 on 06/04/2022.   Elevated liver enzymes Improved.  AST, ALT and ALP are normal.  Bilirubin is almost normal at 1.4   Rhabdomyolysis, hypokalemia, hyponatremia Resolved  Constipation Laxatives as needed   Dementia with behavioral disturbance (HCC) Continue psychotropics     Diet Order             DIET DYS 3 Room service appropriate? Yes; Fluid consistency: Thin  Diet effective now                            Consultants: Palliative care  Procedures: None    Medications:    carbidopa-levodopa  2 tablet Oral 5 X Daily   enoxaparin (LOVENOX) injection  40 mg Subcutaneous Q24H   feeding supplement  237 mL Oral TID BM   gabapentin  300 mg Oral TID   insulin aspart  0-9 Units Subcutaneous TID AC & HS   insulin glargine-yfgn  12 Units Subcutaneous QHS   linagliptin  5 mg Oral Daily   multivitamin with minerals  1 tablet Oral Daily    QUEtiapine  75 mg Oral QHS   rivastigmine  1.5 mg Oral BID   rOPINIRole  2 mg Oral 5 X Daily   sodium chloride flush  3 mL Intravenous Q12H   Continuous Infusions:   Anti-infectives (From admission, onward)    None              Family Communication/Anticipated D/C date and plan/Code Status   DVT prophylaxis: Place and maintain sequential compression device Start: 05/20/22 1140 enoxaparin (LOVENOX) injection 40 mg Start: 05/17/22 2200     Code Status: DNR  Family Communication: Plan discussed with his wife Disposition Plan: To discharge home with home health   Status is: Inpatient Remains inpatient appropriate because: No safe discharge plan at the moment       Subjective:   Patient has no complaints.  His wife was at the bedside.  Objective:    Vitals:   06/08/22 1705 06/08/22 2126 06/09/22 0441 06/09/22 0726  BP: 117/74 (!) 99/59 (!) 156/76 94/64  Pulse: 78 65 82 77  Resp:  '19 19 17  '$ Temp: 98.5 F (36.9 C) 97.6 F (36.4 C) 98.8 F (37.1 C) 98.2 F (36.8 C)  TempSrc:    Oral  SpO2: 100% 100% 100% 94%  Weight:      Height:       No data found.   Intake/Output Summary (Last 24 hours) at 06/09/2022 1330 Last data filed at 06/09/2022 0949 Gross per 24 hour  Intake 237 ml  Output 200 ml  Net 37 ml   Filed Weights   05/20/22 1527 05/21/22 0631 06/03/22 0300  Weight: 79 kg 76.7 kg 75.3 kg    Exam:  GEN: NAD SKIN: Warm and dry EYES: No pallor or icterus ENT: MMM CV: RRR PULM: CTA B ABD: soft, ND, NT, +BS CNS: Alert, confused EXT: No edema or tenderness    Data Reviewed:   I have personally reviewed following labs and imaging studies:  Labs: Labs show the following:   Basic Metabolic Panel: Recent Labs  Lab 06/04/22 0820 06/08/22 0736  NA 135 137  K 4.1 3.8  CL 99 101  CO2 29 29  GLUCOSE 240* 104*  BUN 16 20  CREATININE 0.80 0.90  CALCIUM 9.0 9.0  MG 2.1  --    GFR Estimated Creatinine Clearance: 67 mL/min (by  C-G formula based on SCr of 0.9 mg/dL). Liver Function Tests: Recent Labs  Lab 06/08/22 0736  AST 18  ALT 8  ALKPHOS 83  BILITOT 1.4*  PROT 6.3*  ALBUMIN 3.5   No results for input(s): "LIPASE", "AMYLASE" in the last 168 hours. No results for input(s): "AMMONIA" in the last 168 hours. Coagulation profile No results for input(s): "INR", "PROTIME" in the last 168 hours.  CBC: Recent Labs  Lab  06/04/22 0820  WBC 8.2  NEUTROABS 6.6  HGB 13.4  HCT 41.5  MCV 92.4  PLT 381   Cardiac Enzymes: No results for input(s): "CKTOTAL", "CKMB", "CKMBINDEX", "TROPONINI" in the last 168 hours. BNP (last 3 results) No results for input(s): "PROBNP" in the last 8760 hours. CBG: Recent Labs  Lab 06/08/22 1159 06/08/22 1707 06/08/22 2125 06/09/22 0729 06/09/22 1237  GLUCAP 256* 229* 212* 169* 158*   D-Dimer: No results for input(s): "DDIMER" in the last 72 hours. Hgb A1c: No results for input(s): "HGBA1C" in the last 72 hours. Lipid Profile: No results for input(s): "CHOL", "HDL", "LDLCALC", "TRIG", "CHOLHDL", "LDLDIRECT" in the last 72 hours. Thyroid function studies: No results for input(s): "TSH", "T4TOTAL", "T3FREE", "THYROIDAB" in the last 72 hours.  Invalid input(s): "FREET3" Anemia work up: No results for input(s): "VITAMINB12", "FOLATE", "FERRITIN", "TIBC", "IRON", "RETICCTPCT" in the last 72 hours. Sepsis Labs: Recent Labs  Lab 06/04/22 0820  WBC 8.2    Microbiology No results found for this or any previous visit (from the past 240 hour(s)).  Procedures and diagnostic studies:  No results found.             LOS: 23 days   [place] Copywriter, advertising on [email or web address]. If 7PM-7AM, please contact night-coverage at [email or web address]     06/09/2022, 1:30 PM

## 2022-06-09 NOTE — Plan of Care (Signed)
  Problem: Coping: Goal: Ability to adjust to condition or change in health will improve Outcome: Progressing   

## 2022-06-10 DIAGNOSIS — E1169 Type 2 diabetes mellitus with other specified complication: Secondary | ICD-10-CM | POA: Diagnosis not present

## 2022-06-10 DIAGNOSIS — R627 Adult failure to thrive: Secondary | ICD-10-CM | POA: Diagnosis not present

## 2022-06-10 DIAGNOSIS — F03918 Unspecified dementia, unspecified severity, with other behavioral disturbance: Secondary | ICD-10-CM | POA: Diagnosis not present

## 2022-06-10 DIAGNOSIS — R531 Weakness: Secondary | ICD-10-CM | POA: Diagnosis not present

## 2022-06-10 LAB — GLUCOSE, CAPILLARY
Glucose-Capillary: 137 mg/dL — ABNORMAL HIGH (ref 70–99)
Glucose-Capillary: 239 mg/dL — ABNORMAL HIGH (ref 70–99)
Glucose-Capillary: 197 mg/dL — ABNORMAL HIGH (ref 70–99)
Glucose-Capillary: 193 mg/dL — ABNORMAL HIGH (ref 70–99)

## 2022-06-10 MED ORDER — ACETAMINOPHEN 325 MG PO TABS
650.0000 mg | ORAL_TABLET | ORAL | Status: DC | PRN
  Administered 2022-06-10: 650 mg via ORAL
  Filled 2022-06-10: qty 2

## 2022-06-10 MED ORDER — ACETAMINOPHEN 650 MG RE SUPP: 650.0000 mg | Freq: Four times a day (QID) | RECTAL | Status: DC | PRN

## 2022-06-10 NOTE — Progress Notes (Signed)
Physical Therapy Treatment Patient Details Name: Randall [name] MRN: [id number] DOB: Jan 27, 1951 Today's Date: 06/10/2022   History of Present Illness Pt is a 71 year old male admitted after being found down on floor for 12 hours, admitted with significant rhabdomyolysis; PMH significant for  hypertension, hyperlipidemia, diabetes mellitus type 2, Parkinson's disease with dementia. Pt required haldol on 05/19/21 2/2 agitation/sundowning.    PT Comments    Pt alert, sitting EOB with OT upon entrance. Sit <> stand from EOB and from recliner during session, min-modAx2 with RW stabilization, pt challenged by anterior weight shift, and then posterior lean once up in standing. With time and cueing, he did progress to ambulating with PT/OT today. At least minA to assist with RW management (another CGA for safety). Pt with festination and freezing of gait consistently during ambulation, intermittently needed to be stopped by PT to avoid LOB forward. Close chair follow provided as well, returned to room up in chair with chair alarm on, all needs in reach. The patient would benefit from further skilled PT intervention to continue to progress towards goals. Recommendation remains appropriate.    Recommendations for follow up therapy are one component of a multi-disciplinary discharge planning process, led by the attending physician.  Recommendations may be updated based on patient status, additional functional criteria and insurance authorization.  Follow Up Recommendations  Skilled nursing-short term rehab (<3 hours/day) Can patient physically be transported by private vehicle: No   Assistance Recommended at Discharge Frequent or constant Supervision/Assistance  Patient can return home with the following Assistance with cooking/housework;Assist for transportation;Help with stairs or ramp for entrance;Direct supervision/assist for financial management;A lot of help with walking and/or transfers;A lot of help with  bathing/dressing/bathroom   Equipment Recommendations  Rolling walker (2 wheels);BSC/3in1;Wheelchair (measurements PT);Wheelchair cushion (measurements PT)    Recommendations for Other Services       Precautions / Restrictions Precautions Precautions: Fall Precaution Comments: Aspiration Restrictions Weight Bearing Restrictions: No     Mobility  Bed Mobility               General bed mobility comments: sitting EOB with OT upon PT entrance    Transfers Overall transfer level: Needs assistance Equipment used: Rolling walker (2 wheels) Transfers: Sit to/from Stand Sit to Stand: +2 safety/equipment, Mod assist                Ambulation/Gait Ambulation/Gait assistance: Min assist, Min guard Gait Distance (Feet): 170 Feet Assistive device: Rolling walker (2 wheels)   Gait velocity: decreased and then increased     General Gait Details: festination and freezing of gait noted. constant minA due to pt driving RW to the left, close chair follow for safety. 1 standing rest break while pt spoke to an Insurance underwriter [place] (Stroke Patients Only)       Balance Overall balance assessment: Needs assistance Sitting-balance support: Feet supported, Bilateral upper extremity supported Sitting balance-Leahy Scale: Fair   Postural control: Posterior lean Standing balance support: Bilateral upper extremity supported, Reliant on assistive device for balance Standing balance-Leahy Scale: Poor Standing balance comment: at least CGA for balance throughout standing, 90% of the time needed at min-modA                            Cognition Arousal/Alertness: Awake/alert Behavior During Therapy: [facility] for tasks assessed/performed Overall  Cognitive Status: History of cognitive impairments - at baseline                                 General Comments: improvement in speech noted today, but still  garbled        Exercises      General Comments        Pertinent Vitals/Pain Pain Assessment Pain Assessment: No/denies pain    Home Living                          Prior Function            PT Goals (current goals can now be found in the care plan section) Progress towards PT goals: Progressing toward goals    Frequency    Min 2X/week      PT Plan Current plan remains appropriate    Co-evaluation PT/OT/SLP Co-Evaluation/Treatment: Yes Reason for Co-Treatment: For patient/therapist safety;To address functional/ADL transfers PT goals addressed during session: Mobility/safety with mobility;Balance;Proper use of DME OT goals addressed during session: ADL's and self-care;Proper use of Adaptive equipment and DME      AM-PAC PT "6 Clicks" Mobility   Outcome Measure  Help needed turning from your back to your side while in a flat bed without using bedrails?: A Lot Help needed moving from lying on your back to sitting on the side of a flat bed without using bedrails?: A Lot Help needed moving to and from a bed to a chair (including a wheelchair)?: A Lot Help needed standing up from a chair using your arms (e.g., wheelchair or bedside chair)?: A Lot Help needed to walk in hospital room?: A Lot Help needed climbing 3-5 steps with a railing? : Total 6 Click Score: 11    End of Session Equipment Utilized During Treatment: Gait belt Activity Tolerance: Patient tolerated treatment well Patient left: with call bell/phone within reach;in chair;with chair alarm set Nurse Communication: Mobility status PT Visit Diagnosis: Unsteadiness on feet (R26.81);History of falling (Z91.81);Muscle weakness (generalized) (M62.81);Other abnormalities of gait and mobility (R26.89)     Time: 8938-1017 PT Time Calculation (min) (ACUTE ONLY): 19 min  Charges:  $Therapeutic Exercise: 8-22 mins                     Lieutenant Diego PT, DPT 10:52 AM,06/10/22

## 2022-06-10 NOTE — TOC Progression Note (Addendum)
Transition of Care [facility]) - Progression Note    Patient Details  Name: Randall [name] MRN: [id number] Date of Birth: 1950/11/16  Transition of [facility]) CM/SW Contact  Candie Chroman, LCSW Phone Number: 06/10/2022, 11:27 AM  Clinical Narrative:   Per CMA, wife called and will have [employer] fax appeal information to [facility] office.  3:53 pm: Gave wife paperwork to fill out for appeal.  Expected Discharge Plan: [place] Barriers to Discharge: Continued Medical Work up  Expected Discharge Plan and Services Expected Discharge Plan: [place] Choice: [place] arrangements for the past 2 months: Single Family Home                                       Social Determinants of Health (SDOH) Interventions Utilities Interventions: Intervention Not Indicated  Readmission Risk Interventions     No data to display

## 2022-06-10 NOTE — Plan of Care (Signed)
  Problem: Fluid Volume: Goal: Ability to maintain a balanced intake and output will improve Outcome: Progressing   

## 2022-06-10 NOTE — Progress Notes (Addendum)
Mobility Specialist - Progress Note   06/10/22 1200  Mobility  Activity Ambulated with assistance in hallway  Activity Response Tolerated well  Distance Ambulated (ft) 160 ft  $Mobility charge 1 Mobility  Level of Assistance Minimal assist, patient does 75% or more  Assistive Device Front wheel walker  Range of Motion/Exercises Active;Right arm;Left arm;Right leg;Left leg  Mobility Referral Yes     Pt sitting in recliner upon arrival, utilizing RA. Pt agreeable to activity. STS from chair with close supervision and extra time. Does require cueing for corrective posture and anterior weight shifting. Pt ambulated into hallway with CGA and occasional minA to navigate RW during turns. Upon return to room, pt participated in seated therex. Does report pain in L shoulder but good ROM in UE. States pain has been ongoing for awhile now. Pt left in chair with alarm set, needs in reach. RN notified.    Kathee Delton Mobility Specialist 06/10/22, 12:19 PM

## 2022-06-10 NOTE — Progress Notes (Signed)
Occupational Therapy Treatment Patient Details Name: Randall [name] MRN: [id number] DOB: 12-25-1950 Today's Date: 06/10/2022   History of present illness Pt is a 71 year old male admitted after being found down on floor for 12 hours, admitted with significant rhabdomyolysis; PMH significant for  hypertension, hyperlipidemia, diabetes mellitus type 2, Parkinson's disease with dementia. Pt required haldol on 05/19/21 2/2 agitation/sundowning.   OT comments  Patient in bed upon arrival, nursing student present and agreeable to OT services. Patient required min A for bed mobility. No lateral lean observed while sitting EOB. Patient required mod A +2 to transfer  sit <> stand. Posterior lean observed while standing, requiring VC and postural facilitation to correct. Patient was able to ambulate ~262ft around nursing station requiring min A to steer walker and maintain upright posture; recliner following behind for safety and decreased endurance. Slow gait observed; required intermittent stops by therapist to prevent LOB; 1 standing rest break during ambulation. Patient left in recliner with chair alarm set, call bell in reach and all needs met.    Recommendations for follow up therapy are one component of a multi-disciplinary discharge planning process, led by the attending physician.  Recommendations may be updated based on patient status, additional functional criteria and insurance authorization.    Follow Up Recommendations  Skilled nursing-short term rehab (<3 hours/day)    Assistance Recommended at Discharge Frequent or constant Supervision/Assistance  Patient can return home with the following  Assistance with cooking/housework;Direct supervision/assist for medications management;A lot of help with walking and/or transfers;A lot of help with bathing/dressing/bathroom;Direct supervision/assist for financial management;Assist for transportation;Help with stairs or ramp for entrance   Equipment  Recommendations  Other (comment) (Defer to next venue of care.)    Recommendations for Other Services      Precautions / Restrictions Precautions Precautions: Fall Precaution Comments: Aspiration Restrictions Weight Bearing Restrictions: No       Mobility Bed Mobility Overal bed mobility: Needs Assistance Bed Mobility: Supine to Sit     Supine to sit: Min assist, HOB elevated          Transfers Overall transfer level: Needs assistance Equipment used: Rolling walker (2 wheels) Transfers: Sit to/from Stand Sit to Stand: +2 safety/equipment, Mod assist                 Balance Overall balance assessment: Needs assistance Sitting-balance support: Feet supported, Bilateral upper extremity supported Sitting balance-Leahy Scale: Fair     Standing balance support: Bilateral upper extremity supported, Reliant on assistive device for balance Standing balance-Leahy Scale: Poor                             ADL either performed or assessed with clinical judgement    Extremity/Trunk Assessment Upper Extremity Assessment Upper Extremity Assessment: Generalized weakness   Lower Extremity Assessment Lower Extremity Assessment: Generalized weakness        Vision Baseline Vision/History: 1 Wears glasses Patient Visual Report: No change from baseline            Cognition Arousal/Alertness: Awake/alert Behavior During Therapy: WFL for tasks assessed/performed Overall Cognitive Status: History of cognitive impairments - at baseline                                                     Pertinent  Vitals/ Pain       Pain Assessment Pain Assessment: No/denies pain   Frequency  Min 2X/week        Progress Toward Goals  OT Goals(current goals can now be found in the care plan section)  Progress towards OT goals: Progressing toward goals  Acute Rehab OT Goals Patient Stated Goal: to return home. OT Goal Formulation: With  patient Time For Goal Achievement: 06/17/22 Potential to Achieve Goals: Good  Plan Frequency remains appropriate;Discharge plan remains appropriate       AM-PAC OT "6 Clicks" Daily Activity     Outcome Measure   Help from another person eating meals?: A Little Help from another person taking care of personal grooming?: A Little Help from another person toileting, which includes using toliet, bedpan, or urinal?: A Lot Help from another person bathing (including washing, rinsing, drying)?: A Lot Help from another person to put on and taking off regular upper body clothing?: A Little Help from another person to put on and taking off regular lower body clothing?: A Lot 6 Click Score: 15    End of Session Equipment Utilized During Treatment: Gait belt;Rolling walker (2 wheels)  OT Visit Diagnosis: Unsteadiness on feet (R26.81);Other abnormalities of gait and mobility (R26.89);History of falling (Z91.81)   Activity Tolerance Patient tolerated treatment well   Patient Left in chair;with call bell/phone within reach;with chair alarm set   Nurse Communication Mobility status        Time: 7564-3329 OT Time Calculation (min): 25 min  Charges:       Tomasa Blase, OTS 06/10/2022, 10:22 AM

## 2022-06-10 NOTE — Progress Notes (Signed)
Progress Note    Randall [name]  GXQ:[id number] DOB: 1950/10/17  DOA: 05/17/2022 PCP: Adin Hector, MD      Brief Narrative:    Medical records reviewed and are as summarized below:  Randall [name] is a 71 y.o. male with medical history significant of hypertension, hyperlipidemia, diabetes mellitus type 2, Parkinson's disease with dementia presents after having a fall at home.  Patient was on the floor for about 12 hours.  By time he arrived in the hospital, he was found to have significant rhabdomyolysis with CK level of 8278.  Renal function still normal.  He was placed on IV fluids. Patient CK level was normalized on 9/12.  However, patient developed some confusion as well as dysphagia.  PT/OT recommended nursing home placement.     On 05/24/2022 urine very dark.  Patient given a fluid bolus and urine cleared up a little bit.  No blood in the urine.   On 05/25/2022.  Patient was having facial tremor and difficulty eating and taking meds.   05/26/2022 through 05/28/2022.  The patient has not been eating very much.  His urine is dark.  Bilirubin slightly high.  Previous urine analysis did not show any blood in the urine.  The patient wants to get out of the hospital.  On 05/28/2022 and his wife chose a bed at [facility] and insurance authorization is undergoing.   9/20: Received notification from [facility] that patient's insurance carrier has declined skilled nursing facility authorization.  [facility] to peer with medical director at [facility] (Dr. Coralie Carpen at [phone]) was done by Dr. Priscella Mann.  Denial was upheld.   9/22: Denial upheld through [place].  TOC engaged and attempting to assist with assisted living facility placement.   9/27: Insurance authorization for SNF denied again per Donny Pique, Tourist information centre manager.        Assessment/Plan:   Principal Problem:   Failure to thrive in adult Active Problems:   Rhabdomyolysis   Acute delirium   Parkinson's disease   Fall at home, initial  encounter   Leukocytosis   Hyponatremia   Type 2 diabetes mellitus with hyperlipidemia (HCC)   Transaminitis   Hyperbilirubinemia   Dysphagia   Dementia with behavioral disturbance (HCC)   Constipation   Weakness   Hypokalemia   Nutrition Problem: Inadequate oral intake Etiology: acute illness  Signs/Symptoms: per patient/family report   Body mass index is 26.79 kg/m.   * Failure to thrive in adult with mechanical fall, generalized weakness Reportedly fell on 06/02/2022 but without any injuries. Continue fall precautions.  Continue PT and OT while inpatient.  PT recommended discharge to SNF but authorization to SNF has been denied twice.  His wife was launched  an appeal to contest the denial.   Acute delirium Waxing and waning mental state.  Continue Seroquel   Parkinson's disease ([place]) Continue ropinirole and Sinemet   Type 2 diabetes mellitus with hyperglycemia Continue Semglee 12 units nightly.  NovoLog as needed for hyperglycemia.  Last hemoglobin A1c 5.5 on 06/04/2022.   Elevated liver enzymes Improved.  AST, ALT and ALP are normal.  Bilirubin is almost normal at 1.4   Rhabdomyolysis, hypokalemia, hyponatremia Resolved  Constipation Laxatives as needed   Dementia with behavioral disturbance (HCC) Continue psychotropics     Diet Order             DIET DYS 3 Room service appropriate? Yes; Fluid consistency: Thin  Diet effective now  Consultants: Palliative care  Procedures: None    Medications:    carbidopa-levodopa  2 tablet Oral 5 X Daily   enoxaparin (LOVENOX) injection  40 mg Subcutaneous Q24H   feeding supplement  237 mL Oral TID BM   gabapentin  300 mg Oral TID   insulin aspart  0-9 Units Subcutaneous TID AC & HS   insulin glargine-yfgn  12 Units Subcutaneous QHS   linagliptin  5 mg Oral Daily   multivitamin with minerals  1 tablet Oral Daily   QUEtiapine  75 mg Oral QHS   rivastigmine  1.5 mg  Oral BID   rOPINIRole  2 mg Oral 5 X Daily   sodium chloride flush  3 mL Intravenous Q12H   Continuous Infusions:   Anti-infectives (From admission, onward)    None              Family Communication/Anticipated D/C date and plan/Code Status   DVT prophylaxis: Place and maintain sequential compression device Start: 05/20/22 1140 enoxaparin (LOVENOX) injection 40 mg Start: 05/17/22 2200     Code Status: DNR  Family Communication: None Disposition Plan: To discharge home with home health   Status is: Inpatient Remains inpatient appropriate because: No safe discharge plan at the moment       Subjective:   Interval events noted.  He has no complaints.  He said he feels good.  Objective:    Vitals:   06/09/22 1619 06/09/22 2020 06/10/22 0509 06/10/22 0735  BP: 138/77 (!) 144/85 (!) 153/72 103/70  Pulse: 82 68 61 71  Resp: '17 16 18 18  '$ Temp: 97.7 F (36.5 C) 97.9 F (36.6 C) 98.6 F (37 C) 97.7 F (36.5 C)  TempSrc:    Oral  SpO2: 99% 100% 100% 97%  Weight:      Height:       No data found.   Intake/Output Summary (Last 24 hours) at 06/10/2022 1254 Last data filed at 06/10/2022 0953 Gross per 24 hour  Intake 268 ml  Output 1400 ml  Net -1132 ml   Filed Weights   05/20/22 1527 05/21/22 0631 06/03/22 0300  Weight: 79 kg 76.7 kg 75.3 kg    Exam:  GEN: NAD, sitting up in the chair SKIN: Warm and dry EYES: EOMI ENT: MMM CV: RRR PULM: CTA B ABD: soft, ND, NT, +BS CNS: Alert and oriented to person and place, no focal deficits.  Speech is soft and slurred EXT: No edema or tenderness     Data Reviewed:   I have personally reviewed following labs and imaging studies:  Labs: Labs show the following:   Basic Metabolic Panel: Recent Labs  Lab 06/04/22 0820 06/08/22 0736  NA 135 137  K 4.1 3.8  CL 99 101  CO2 29 29  GLUCOSE 240* 104*  BUN 16 20  CREATININE 0.80 0.90  CALCIUM 9.0 9.0  MG 2.1  --    GFR Estimated Creatinine  Clearance: 67 mL/min (by C-G formula based on SCr of 0.9 mg/dL). Liver Function Tests: Recent Labs  Lab 06/08/22 0736  AST 18  ALT 8  ALKPHOS 83  BILITOT 1.4*  PROT 6.3*  ALBUMIN 3.5   No results for input(s): "LIPASE", "AMYLASE" in the last 168 hours. No results for input(s): "AMMONIA" in the last 168 hours. Coagulation profile No results for input(s): "INR", "PROTIME" in the last 168 hours.  CBC: Recent Labs  Lab 06/04/22 0820  WBC 8.2  NEUTROABS 6.6  HGB 13.4  HCT 41.5  MCV 92.4  PLT 381   Cardiac Enzymes: No results for input(s): "CKTOTAL", "CKMB", "CKMBINDEX", "TROPONINI" in the last 168 hours. BNP (last 3 results) No results for input(s): "PROBNP" in the last 8760 hours. CBG: Recent Labs  Lab 06/09/22 1237 06/09/22 1620 06/09/22 2020 06/10/22 0754 06/10/22 1146  GLUCAP 158* 225* 155* 137* 239*   D-Dimer: No results for input(s): "DDIMER" in the last 72 hours. Hgb A1c: No results for input(s): "HGBA1C" in the last 72 hours. Lipid Profile: No results for input(s): "CHOL", "HDL", "LDLCALC", "TRIG", "CHOLHDL", "LDLDIRECT" in the last 72 hours. Thyroid function studies: No results for input(s): "TSH", "T4TOTAL", "T3FREE", "THYROIDAB" in the last 72 hours.  Invalid input(s): "FREET3" Anemia work up: No results for input(s): "VITAMINB12", "FOLATE", "FERRITIN", "TIBC", "IRON", "RETICCTPCT" in the last 72 hours. Sepsis Labs: Recent Labs  Lab 06/04/22 0820  WBC 8.2    Microbiology No results found for this or any previous visit (from the past 240 hour(s)).  Procedures and diagnostic studies:  No results found.             LOS: 24 days   [place] [facility]   Pager on [email or web address]. If 7PM-7AM, please contact night-coverage at [email or web address]     06/10/2022, 12:54 PM

## 2022-06-10 NOTE — Plan of Care (Signed)
  Problem: Coping: Goal: Ability to adjust to condition or change in health will improve Outcome: Progressing   

## 2022-06-11 DIAGNOSIS — F03918 Unspecified dementia, unspecified severity, with other behavioral disturbance: Secondary | ICD-10-CM | POA: Diagnosis not present

## 2022-06-11 DIAGNOSIS — R531 Weakness: Secondary | ICD-10-CM | POA: Diagnosis not present

## 2022-06-11 DIAGNOSIS — R627 Adult failure to thrive: Secondary | ICD-10-CM | POA: Diagnosis not present

## 2022-06-11 LAB — GLUCOSE, CAPILLARY
Glucose-Capillary: 187 mg/dL — ABNORMAL HIGH (ref 70–99)
Glucose-Capillary: 237 mg/dL — ABNORMAL HIGH (ref 70–99)
Glucose-Capillary: 168 mg/dL — ABNORMAL HIGH (ref 70–99)
Glucose-Capillary: 210 mg/dL — ABNORMAL HIGH (ref 70–99)

## 2022-06-11 NOTE — Progress Notes (Signed)
Progress Note    Randall [name]  [id number]:[id number] DOB: 11/09/1950  DOA: 05/17/2022 PCP: Adin Hector, MD      Brief Narrative:    Medical records reviewed and are as summarized below:  Randall [name] is a 71 y.o. male with medical history significant of hypertension, hyperlipidemia, diabetes mellitus type 2, Parkinson's disease with dementia presents after having a fall at home.  Patient was on the floor for about 12 hours.  By time he arrived in the hospital, he was found to have significant rhabdomyolysis with CK level of 8278.  Renal function still normal.  He was placed on IV fluids. Patient CK level was normalized on 9/12.  However, patient developed some confusion as well as dysphagia.  PT/OT recommended nursing home placement.     On 05/24/2022 urine very dark.  Patient given a fluid bolus and urine cleared up a little bit.  No blood in the urine.   On 05/25/2022.  Patient was having facial tremor and difficulty eating and taking meds.   05/26/2022 through 05/28/2022.  The patient has not been eating very much.  His urine is dark.  Bilirubin slightly high.  Previous urine analysis did not show any blood in the urine.  The patient wants to get out of the hospital.  On 05/28/2022 and his wife chose a bed at [facility] and insurance authorization is undergoing.   9/20: Received notification from [facility] that patient's insurance carrier has declined skilled nursing facility authorization.  Peer to peer with medical director at [facility] (Dr. Coralie Carpen at [phone]) was done by Dr. Priscella Mann.  Denial was upheld.   9/22: Denial upheld through [place].  TOC engaged and attempting to assist with assisted living facility placement.   9/27: Insurance authorization for SNF denied again per Donny Pique, Tourist information centre manager.        Assessment/Plan:   Principal Problem:   Failure to thrive in adult Active Problems:   Rhabdomyolysis   Acute delirium   Parkinson's disease   Fall at home, initial  encounter   Leukocytosis   Hyponatremia   Type 2 diabetes mellitus with hyperlipidemia (HCC)   Transaminitis   Hyperbilirubinemia   Dysphagia   Dementia with behavioral disturbance (HCC)   Constipation   Weakness   Hypokalemia   Nutrition Problem: Inadequate oral intake Etiology: acute illness  Signs/Symptoms: per patient/family report   Body mass index is 26.79 kg/m.   * Failure to thrive in adult with mechanical fall, generalized weakness Reportedly fell on 06/02/2022 but without any injuries. Continue fall precautions.  Continue PT and OT while inpatient.  PT recommended discharge to SNF but authorization to SNF has been denied twice.  Appeal for insurance denial is still pending.  His wife is looking into hospice to see if patient will be eligible for hospice, but she is concerned that patient does not have Medicare.  Acute delirium Waxing and waning mental state.  Continue Seroquel   Parkinson's disease ([place]) Continue ropinirole and Sinemet   Type 2 diabetes mellitus with hyperglycemia Continue Semglee 12 units nightly.  NovoLog as needed for hyperglycemia.  Last hemoglobin A1c 5.5 on 06/04/2022.   Elevated liver enzymes Improved.  AST, ALT and ALP are normal.  Bilirubin is almost normal at 1.4   Rhabdomyolysis, hypokalemia, hyponatremia Resolved  Constipation Laxatives as needed   Dementia with behavioral disturbance (HCC) Continue psychotropics     Diet Order  DIET DYS 3 Room service appropriate? Yes; Fluid consistency: Thin  Diet effective now                            Consultants: Palliative care  Procedures: None    Medications:    carbidopa-levodopa  2 tablet Oral 5 X Daily   enoxaparin (LOVENOX) injection  40 mg Subcutaneous Q24H   feeding supplement  237 mL Oral TID BM   gabapentin  300 mg Oral TID   insulin aspart  0-9 Units Subcutaneous TID AC & HS   insulin glargine-yfgn  12 Units Subcutaneous QHS    linagliptin  5 mg Oral Daily   multivitamin with minerals  1 tablet Oral Daily   QUEtiapine  75 mg Oral QHS   rivastigmine  1.5 mg Oral BID   rOPINIRole  2 mg Oral 5 X Daily   sodium chloride flush  3 mL Intravenous Q12H   Continuous Infusions:   Anti-infectives (From admission, onward)    None              Family Communication/Anticipated D/C date and plan/Code Status   DVT prophylaxis: Place and maintain sequential compression device Start: 05/20/22 1140 enoxaparin (LOVENOX) injection 40 mg Start: 05/17/22 2200     Code Status: DNR  Family Communication: Plan discussed with his wife at the bedside Disposition Plan: To discharge home with home health   Status is: Inpatient Remains inpatient appropriate because: No safe discharge plan at the moment       Subjective:   Interval events noted.  He has no complaints.  His wife is at the bedside.  He was sitting up in the chair having lunch.  Objective:    Vitals:   06/10/22 0735 06/10/22 1941 06/11/22 0518 06/11/22 0811  BP: 103/70 130/76 (!) 144/92 122/82  Pulse: 71 73 74 70  Resp: '18 18 20 16  '$ Temp: 97.7 F (36.5 C) 98.5 F (36.9 C) 97.7 F (36.5 C) 98 F (36.7 C)  TempSrc: Oral     SpO2: 97% 98% 99% 98%  Weight:      Height:       No data found.   Intake/Output Summary (Last 24 hours) at 06/11/2022 1325 Last data filed at 06/11/2022 0400 Gross per 24 hour  Intake 200 ml  Output 1000 ml  Net -800 ml   Filed Weights   05/20/22 1527 05/21/22 0631 06/03/22 0300  Weight: 79 kg 76.7 kg 75.3 kg    Exam:  GEN: NAD, sitting up in the chair.  He was having lunch SKIN: Warm and dry EYES: No pallor or icterus ENT: MMM CV: RRR PULM: CTA B ABD: soft, ND, NT, +BS CNS: AAO x 2 (person and place), non focal EXT: No edema or tenderness       Data Reviewed:   I have personally reviewed following labs and imaging studies:  Labs: Labs show the following:   Basic Metabolic Panel: Recent  Labs  Lab 06/08/22 0736  NA 137  K 3.8  CL 101  CO2 29  GLUCOSE 104*  BUN 20  CREATININE 0.90  CALCIUM 9.0   GFR Estimated Creatinine Clearance: 67 mL/min (by C-G formula based on SCr of 0.9 mg/dL). Liver Function Tests: Recent Labs  Lab 06/08/22 0736  AST 18  ALT 8  ALKPHOS 83  BILITOT 1.4*  PROT 6.3*  ALBUMIN 3.5   No results for input(s): "LIPASE", "AMYLASE" in the last 168  hours. No results for input(s): "AMMONIA" in the last 168 hours. Coagulation profile No results for input(s): "INR", "PROTIME" in the last 168 hours.  CBC: No results for input(s): "WBC", "NEUTROABS", "HGB", "HCT", "MCV", "PLT" in the last 168 hours.  Cardiac Enzymes: No results for input(s): "CKTOTAL", "CKMB", "CKMBINDEX", "TROPONINI" in the last 168 hours. BNP (last 3 results) No results for input(s): "PROBNP" in the last 8760 hours. CBG: Recent Labs  Lab 06/10/22 1146 06/10/22 1632 06/10/22 1942 06/11/22 0809 06/11/22 1215  GLUCAP 239* 197* 193* 187* 237*   D-Dimer: No results for input(s): "DDIMER" in the last 72 hours. Hgb A1c: No results for input(s): "HGBA1C" in the last 72 hours. Lipid Profile: No results for input(s): "CHOL", "HDL", "LDLCALC", "TRIG", "CHOLHDL", "LDLDIRECT" in the last 72 hours. Thyroid function studies: No results for input(s): "TSH", "T4TOTAL", "T3FREE", "THYROIDAB" in the last 72 hours.  Invalid input(s): "FREET3" Anemia work up: No results for input(s): "VITAMINB12", "FOLATE", "FERRITIN", "TIBC", "IRON", "RETICCTPCT" in the last 72 hours. Sepsis Labs: No results for input(s): "PROCALCITON", "WBC", "LATICACIDVEN" in the last 168 hours.   Microbiology No results found for this or any previous visit (from the past 240 hour(s)).  Procedures and diagnostic studies:  No results found.             LOS: 25 days   [place] Copywriter, advertising on [email or web address]. If 7PM-7AM, please contact night-coverage at  [email or web address]     06/11/2022, 1:25 PM

## 2022-06-11 NOTE — Progress Notes (Signed)
PT Cancellation Note  Patient Details Name: Randall [name] MRN: [id number] DOB: 1951-01-27   Cancelled Treatment:     Pt fatigued and eating lunch. Worked with OT earlier and had recently finished with Mobility Specialist for out of bed. Will continue PT next available date/time.   Josie Dixon 06/11/2022, 1:27 PM

## 2022-06-11 NOTE — TOC Progression Note (Addendum)
Transition of Care [facility]) - Progression Note    Patient Details  Name: Randall [name] MRN: [id number] Date of Birth: 1951-01-30  [facility]) CM/SW Contact  Laurena Slimmer, RN Phone Number: 06/11/2022, 3:26 PM  Clinical Narrative:    Retrieved completed form from patient's wife at bedside for appeals to [facility]. Appointment of Representative form faxed to [phone]. Transmittal response was :OK.    Expected Discharge Plan: [place] Barriers to Discharge: Continued Medical Work up  Expected Discharge Plan and Services Expected Discharge Plan: [place] Choice: [place] arrangements for the past 2 months: Single Family Home                                       Social Determinants of Health (SDOH) Interventions Utilities Interventions: Intervention Not Indicated  Readmission Risk Interventions     No data to display

## 2022-06-11 NOTE — Progress Notes (Signed)
Nutrition Follow-up  DOCUMENTATION CODES:   Not applicable  INTERVENTION:   -Continue Magic cup TID with meals, each supplement provides 290 kcal and 9 grams of protein  -Continue MVI with minerals daily -Continue dysphagia 3 diet  -Continue Ensure Enlive po TID, each supplement provides 350 kcal and 20 grams of protein -RD to sign off secondary to medical stability and clear goals of care; if further nutrition issues arise, please re-consult RD  NUTRITION DIAGNOSIS:   Inadequate oral intake related to acute illness as evidenced by per patient/family report.  Ongoing  GOAL:   Patient will meet greater than or equal to 90% of their needs  Progressing   MONITOR:   PO intake, Supplement acceptance, Labs, Weight trends, Skin, I & O's  REASON FOR ASSESSMENT:   Malnutrition Screening Tool    ASSESSMENT:   71 y.o. male with medical history significant of hypertension, hyperlipidemia, OSA, depression, diabetes mellitus type 2 and Parkinson's disease with dementia who is admitted with traumatic rhabdomyolysis secondary to fall.  Reviewed I/O's: -1.1 L x 24 hours and -6.8 L 05/28/22  UOP: 1.4 L x 24 hours     Pt remains on a dysphagia 3 diet. Noted meal completions 5-65%. Pt typically accepting 2-3 Ensure supplements per day. Pt with poor oral intake and would benefit from nutrient dense supplement. One Ensure Enlive supplement provides 350 kcals, 20 grams protein, and 44-45 grams of carbohydrate vs one Glucerna shake supplement, which provides 220 kcals, 10 grams of protein, and 26 grams of carbohydrate. Given pt's hx of DM, RD will reassess adequacy of PO intake, CBGS, and adjust supplement regimen as appropriate at follow-up.     Pt awaiting insurance authorization for SNF per TOC notes. Appeal has been made as insurance previously denied SNF.    Palliative care following for goals of care; pt does not desire feeding tube.   Medications reviewed and include sinemet.   Labs  reviewed: CBGS: 137-197 (inpatient orders for glycemic control are 0-9 units insulin aspart TID before meals and bedtime, 5 mg tradjenta daily, and 12 units insulin glargine-yfgn daily at bedtime).    Diet Order:   Diet Order             DIET DYS 3 Room service appropriate? Yes; Fluid consistency: Thin  Diet effective now                   EDUCATION NEEDS:   Education needs have been addressed  Skin:  Skin Assessment: Reviewed RN Assessment  Last BM:  06/02/22  Height:   Ht Readings from Last 1 Encounters:  05/17/22 '5\' 6"'$  (1.676 m)    Weight:   Wt Readings from Last 1 Encounters:  06/03/22 75.3 kg    Ideal Body Weight:  59 kg  BMI:  Body mass index is 26.79 kg/m.  Estimated Nutritional Needs:   Kcal:  1800-2100kcal/day  Protein:  90-105g/day  Fluid:  1.7-2.9L/day    Loistine Chance, RD, LDN, [place] Registered Dietitian II Certified Diabetes Care and Education Specialist Please refer to [facility] for RD and/or RD on-call/weekend/after hours pager

## 2022-06-11 NOTE — Progress Notes (Signed)
Mobility Specialist - Progress Note   06/11/22 1228  Mobility  Activity Transferred to/from [facility];Ambulated with assistance in room  Activity Response Tolerated well  Distance Ambulated (ft) 5 ft  $Mobility charge 1 Mobility  Level of Assistance Minimal assist, patient does 75% or more  Assistive Device Front wheel walker  Mobility Referral Yes     Pt sitting on BSC upon arrival, utilizing RA. Successful with large BM this date. Pt STS from [facility] with maxA. VC for lateral weight shifting to the L and correcting flexed posture. +2 for peri-care d/t need for BUE support. Pt able to take several lateral/retro steps to return to chair. Lunch tray entry at end of session and set up in front of pt. Pt left in recliner with alarm set, needs in reach, spouse at bedside.    Kathee Delton Mobility Specialist 06/11/22, 12:31 PM

## 2022-06-12 DIAGNOSIS — R627 Adult failure to thrive: Secondary | ICD-10-CM | POA: Diagnosis not present

## 2022-06-12 LAB — CBC WITH DIFFERENTIAL/PLATELET
Abs Immature Granulocytes: 0.03 10*3/uL (ref 0.00–0.07)
Basophils Absolute: 0 10*3/uL (ref 0.0–0.1)
Basophils Relative: 0 %
Eosinophils Absolute: 0.3 10*3/uL (ref 0.0–0.5)
Eosinophils Relative: 4 %
HCT: 40.2 % (ref 39.0–52.0)
Hemoglobin: 13 g/dL (ref 13.0–17.0)
Immature Granulocytes: 0 %
Lymphocytes Relative: 9 %
Lymphs Abs: 0.7 10*3/uL (ref 0.7–4.0)
MCH: 29.9 pg (ref 26.0–34.0)
MCHC: 32.3 g/dL (ref 30.0–36.0)
MCV: 92.4 fL (ref 80.0–100.0)
Monocytes Absolute: 0.5 10*3/uL (ref 0.1–1.0)
Monocytes Relative: 6 %
Neutro Abs: 6.3 10*3/uL (ref 1.7–7.7)
Neutrophils Relative %: 81 %
Platelets: 361 10*3/uL (ref 150–400)
RBC: 4.35 MIL/uL (ref 4.22–5.81)
RDW: 13.1 % (ref 11.5–15.5)
WBC: 7.9 10*3/uL (ref 4.0–10.5)
nRBC: 0 % (ref 0.0–0.2)

## 2022-06-12 LAB — GLUCOSE, CAPILLARY
Glucose-Capillary: 84 mg/dL (ref 70–99)
Glucose-Capillary: 109 mg/dL — ABNORMAL HIGH (ref 70–99)
Glucose-Capillary: 247 mg/dL — ABNORMAL HIGH (ref 70–99)
Glucose-Capillary: 141 mg/dL — ABNORMAL HIGH (ref 70–99)
Glucose-Capillary: 180 mg/dL — ABNORMAL HIGH (ref 70–99)

## 2022-06-12 LAB — BASIC METABOLIC PANEL
Anion gap: 4 — ABNORMAL LOW (ref 5–15)
BUN: 11 mg/dL (ref 8–23)
CO2: 30 mmol/L (ref 22–32)
Calcium: 8.7 mg/dL — ABNORMAL LOW (ref 8.9–10.3)
Chloride: 101 mmol/L (ref 98–111)
Creatinine, Ser: 0.85 mg/dL (ref 0.61–1.24)
GFR, Estimated: >60 mL/min (ref >60)
Glucose, Bld: 108 mg/dL — ABNORMAL HIGH (ref 70–99)
Potassium: 3.9 mmol/L (ref 3.5–5.1)
Sodium: 135 mmol/L (ref 135–145)

## 2022-06-12 NOTE — Progress Notes (Signed)
Physical Therapy Treatment Patient Details Name: Randall [name] MRN: [id number] DOB: 1950/09/22 Today's Date: 06/12/2022   History of Present Illness Pt is a 71 year old male admitted after being found down on floor for 12 hours, admitted with significant rhabdomyolysis; PMH significant for  hypertension, hyperlipidemia, diabetes mellitus type 2, Parkinson's disease with dementia. Pt required haldol on 05/19/21 2/2 agitation/sundowning.    PT Comments    Pt is alert, conversational, and oriented x 3. He is extremely motivated and cooperative but severely limited by his parkinson's disease. He requires a varied amount of assistance due to this disease. At first upon sitting up EOB, has severe posteriorly lean but with vcs is able to progress to sitting EOB with supervision only. He required extensive assistance to stand the first 3 x from EOB however by the end of session performed STS 5 x from lower recliner surface. Pt did ambulate a short distance however not very functional with shuffling/parkinsonian gait pattern. Overall pt is far from baseline, abilities prior to admission, but still requiring extensive assistance. Highly recommend DC to SNF to maximize his independence with ADLs while decreasing caregiver burden. Author assisted with breakfast tray set up but once set up, pt was independently able to eat/self feed. Acute PT will continue efforts to progress pt per current POC.    Recommendations for follow up therapy are one component of a multi-disciplinary discharge planning process, led by the attending physician.  Recommendations may be updated based on patient status, additional functional criteria and insurance authorization.  Follow Up Recommendations  Skilled nursing-short term rehab (<3 hours/day)     Assistance Recommended at Discharge Frequent or constant Supervision/Assistance  Patient can return home with the following A lot of help with walking and/or transfers;A lot of help with  bathing/dressing/bathroom;Assistance with cooking/housework;Direct supervision/assist for medications management;Direct supervision/assist for financial management;Assist for transportation;Help with stairs or ramp for entrance (assisted with opening pt's breakfast containers however pt is able to Independently self feed)   Equipment Recommendations  Rolling walker (2 wheels);BSC/3in1;Wheelchair (measurements PT);Wheelchair cushion (measurements PT)       Precautions / Restrictions Precautions Precautions: Fall Precaution Comments: Aspiration Restrictions Weight Bearing Restrictions: No     Mobility  Bed Mobility Overal bed mobility: Needs Assistance Bed Mobility: Supine to Sit  Supine to sit: Min assist, Mod assist, HOB elevated  General bed mobility comments: increased time to perform. initial posterior lean however able to correct with Vcs.    Transfers Overall transfer level: Needs assistance Equipment used: Rolling walker (2 wheels) Transfers: Sit to/from Stand Sit to Stand: Min assist, Max assist  General transfer comment: Pt is inconsistent with standing abilities. Required max assist of one at first however progressed to min assist of one to perform STS 5 x from lower recliner surface. Initial posterior push that resolves with VCs and increased time    Ambulation/Gait Ambulation/Gait assistance: Mod assist, Max assist Gait Distance (Feet): 5 Feet Assistive device: Rolling walker (2 wheels) Gait Pattern/deviations: Step-to pattern, Shuffle, Narrow base of support Gait velocity: decreased     General Gait Details: Pt required extensive assistance to ambulate very short distance this date. Max vcs for technique and sequencing. parkinsons greatly limiting pt's consistency with PT/abilities    Balance Overall balance assessment: Needs assistance Sitting-balance support: Feet supported, Bilateral upper extremity supported Sitting balance-Leahy Scale: Fair Sitting balance  - Comments: poor at first progressing to good by the end. posterior lean initially but with vsc is able to correct. Postural control:  Posterior lean, Right lateral lean (R lateral lean when sitting in recliner) Standing balance support: Bilateral upper extremity supported, Reliant on assistive device for balance Standing balance-Leahy Scale: Poor       Cognition Arousal/Alertness: Awake/alert Behavior During Therapy: WFL for tasks assessed/performed Overall Cognitive Status: History of cognitive impairments - at baseline      General Comments: Pt is alert and converstaional throughout. Sometimes difficult to understand but is able to state his thoughts with increased time. He is alert and oriented x 3 but overall struggles to understand current situation. Very motivated nd cooperative throughout. Parkinsons greatly limits pt's abilities               Pertinent Vitals/Pain Pain Assessment Pain Assessment: 0-10 Pain Score: 3  Pain Location: chronic L shoulder pain Pain Descriptors / Indicators: Discomfort Pain Intervention(s): Limited activity within patient's tolerance, Monitored during session, Premedicated before session, Repositioned     PT Goals (current goals can now be found in the care plan section) Acute Rehab PT Goals Patient Stated Goal: to get better Progress towards PT goals: Not progressing toward goals - comment (pt has been inconsistent due to parkinsons)    Frequency    Min 2X/week      PT Plan Current plan remains appropriate    Co-evaluation     PT goals addressed during session: Mobility/safety with mobility;Balance;Proper use of DME;Strengthening/ROM        AM-PAC PT "6 Clicks" Mobility   Outcome Measure  Help needed turning from your back to your side while in a flat bed without using bedrails?: A Lot Help needed moving from lying on your back to sitting on the side of a flat bed without using bedrails?: A Lot Help needed moving to and from  a bed to a chair (including a wheelchair)?: A Lot Help needed standing up from a chair using your arms (e.g., wheelchair or bedside chair)?: A Lot Help needed to walk in hospital room?: A Lot Help needed climbing 3-5 steps with a railing? : Total 6 Click Score: 11    End of Session Equipment Utilized During Treatment: Gait belt Activity Tolerance: Patient tolerated treatment well Patient left: in chair;with call bell/phone within reach;with chair alarm set Nurse Communication: Mobility status PT Visit Diagnosis: Unsteadiness on feet (R26.81);History of falling (Z91.81);Muscle weakness (generalized) (M62.81);Other abnormalities of gait and mobility (R26.89)     Time: 3335-4562 PT Time Calculation (min) (ACUTE ONLY): 38 min  Charges:  $Gait Training: 8-22 mins $Therapeutic Activity: 23-37 mins                     Julaine Fusi PTA 06/12/22, 6:24 PM

## 2022-06-12 NOTE — TOC Progression Note (Signed)
Transition of Care [facility]) - Progression Note    Patient Details  Name: Randall [name] MRN: [id number] Date of Birth: Oct 13, 1950  [facility]) CM/SW Contact  Laurena Slimmer, RN Phone Number: 06/12/2022, 4:15 PM  Clinical Narrative:    Appeals form not completed in it's entiretyby patient's wife. Form left in patient's room. Called patient's wife to advise form must be completed and resubnitted by 10/05. MD notified.   Expected Discharge Plan: [place] Barriers to Discharge: Continued Medical Work up  Expected Discharge Plan and Services Expected Discharge Plan: [place] Choice: [place] arrangements for the past 2 months: Single Family Home                                       Social Determinants of Health (SDOH) Interventions Utilities Interventions: Intervention Not Indicated  Readmission Risk Interventions     No data to display

## 2022-06-12 NOTE — Progress Notes (Signed)
Occupational Therapy Treatment Patient Details Name: Randall [name] MRN: [id number] DOB: 10/23/1950 Today's Date: 06/12/2022   History of present illness Pt is a 71 year old male admitted after being found down on floor for 12 hours, admitted with significant rhabdomyolysis; PMH significant for  hypertension, hyperlipidemia, diabetes mellitus type 2, Parkinson's disease with dementia. Pt required haldol on 05/19/21 2/2 agitation/sundowning.   OT comments  Patient in recliner upon arrival and agreeable to OT services. Right lateral lean observed requiring assistance to correct correct. Patient required mod A +2 for transfers. Patient had significant difficulty initiating tasks requiring multimodal cues. Patient observed grinding teeth, appearing anxious; requiring redirection. Min A for bed mobility. Patient requesting beach music, happily singing. Patient left in bed, bed alarm set, call bell in reach, and all needs met.     Recommendations for follow up therapy are one component of a multi-disciplinary discharge planning process, led by the attending physician.  Recommendations may be updated based on patient status, additional functional criteria and insurance authorization.    Follow Up Recommendations  Skilled nursing-short term rehab (<3 hours/day)    Assistance Recommended at Discharge Frequent or constant Supervision/Assistance  Patient can return home with the following  Assistance with cooking/housework;Direct supervision/assist for medications management;A lot of help with walking and/or transfers;A lot of help with bathing/dressing/bathroom;Direct supervision/assist for financial management;Assist for transportation;Help with stairs or ramp for entrance   Equipment Recommendations  Other (comment) (Defer to next venue of care.)    Recommendations for Other Services      Precautions / Restrictions Precautions Precautions: Fall Precaution Comments: Aspiration Restrictions Weight  Bearing Restrictions: No       Mobility Bed Mobility Overal bed mobility: Needs Assistance Bed Mobility: Sit to Supine       Sit to supine: Min assist   General bed mobility comments: Patient in recliner upon arrival.    Transfers Overall transfer level: Needs assistance Equipment used: Rolling walker (2 wheels) Transfers: Sit to/from Stand Sit to Stand: +2 safety/equipment, Mod assist                 Balance Overall balance assessment: Needs assistance Sitting-balance support: Feet supported, Bilateral upper extremity supported Sitting balance-Leahy Scale: Fair   Postural control: Right lateral lean Standing balance support: Bilateral upper extremity supported, Reliant on assistive device for balance Standing balance-Leahy Scale: Poor                             ADL either performed or assessed with clinical judgement    Extremity/Trunk Assessment Upper Extremity Assessment Upper Extremity Assessment: Generalized weakness   Lower Extremity Assessment Lower Extremity Assessment: Generalized weakness        Vision Baseline Vision/History: 1 Wears glasses Patient Visual Report: No change from baseline            Cognition Arousal/Alertness: Awake/alert Behavior During Therapy: WFL for tasks assessed/performed Overall Cognitive Status: History of cognitive impairments - at baseline                                                     Pertinent Vitals/ Pain       Pain Assessment Pain Assessment: No/denies pain   Frequency  Min 2X/week        Progress Toward Goals  OT  Goals(current goals can now be found in the care plan section)  Progress towards OT goals: Progressing toward goals  Acute Rehab OT Goals Patient Stated Goal: to return home. OT Goal Formulation: With patient Time For Goal Achievement: 06/17/22 Potential to Achieve Goals: Good  Plan Frequency remains appropriate;Discharge plan remains  appropriate       AM-PAC OT "6 Clicks" Daily Activity     Outcome Measure   Help from another person eating meals?: A Little Help from another person taking care of personal grooming?: A Little Help from another person toileting, which includes using toliet, bedpan, or urinal?: A Lot Help from another person bathing (including washing, rinsing, drying)?: A Lot Help from another person to put on and taking off regular upper body clothing?: A Little Help from another person to put on and taking off regular lower body clothing?: A Lot 6 Click Score: 15    End of Session Equipment Utilized During Treatment: Gait belt;Rolling walker (2 wheels)  OT Visit Diagnosis: Unsteadiness on feet (R26.81);Other abnormalities of gait and mobility (R26.89);History of falling (Z91.81)   Activity Tolerance Patient tolerated treatment well   Patient Left in bed;with call bell/phone within reach;with bed alarm set   Nurse Communication Mobility status        Time: 2493-2419 OT Time Calculation (min): 24 min  Charges: OT General Charges $OT Visit: 1 Visit OT Treatments $Therapeutic Activity: 8-22 mins    Jessah Danser, OTS 06/12/2022, 1:29 PM

## 2022-06-12 NOTE — Progress Notes (Signed)
PROGRESS NOTE    Randall [name]  GEZ:[id number] DOB: [birth year] DOA: 05/17/2022 PCP: Adin Hector, MD    Brief Narrative:  Randall [name] is a 71 y.o. male with medical history significant of hypertension, hyperlipidemia, diabetes mellitus type 2, Parkinson's disease with dementia presents after having a fall at home.  Patient was on the floor for about 12 hours.  By time he arrived in the hospital, he was found to have significant rhabdomyolysis with CK level of 8278.  Renal function still normal.  He was placed on IV fluids. Patient CK level was normalized on 9/12.  However, patient developed some confusion as well as dysphagia.  PT/OT recommended nursing home placement.     On 05/24/2022 urine very dark.  Patient given a fluid bolus and urine cleared up a little bit.  No blood in the urine.   On 05/25/2022.  Patient was having facial tremor and difficulty eating and taking meds.   05/26/2022 through 05/28/2022.  The patient has not been eating very much.  His urine is dark.  Bilirubin slightly high.  Previous urine analysis did not show any blood in the urine.  The patient wants to get out of the hospital.  On 05/28/2022 and his wife chose a bed at [facility] and insurance authorization is undergoing.   9/20: Received notification from [facility] that patient's insurance carrier has declined skilled nursing facility authorization.  Peer to peer with medical director at [facility] (Dr. Coralie Carpen at [phone]) was done by Dr. Priscella Mann.  Denial was upheld.   9/22: Denial upheld through [place].  TOC engaged and attempting to assist with assisted living facility placement.   9/27: Insurance authorization for SNF denied again per Donny Pique, Tourist information centre manager.   Assessment & Plan:   Principal Problem:   Failure to thrive in adult Active Problems:   Rhabdomyolysis   Acute delirium   Parkinson's disease   Fall at home, initial encounter   Leukocytosis   Hyponatremia   Type 2 diabetes mellitus with  hyperlipidemia (HCC)   Transaminitis   Hyperbilirubinemia   Dysphagia   Dementia with behavioral disturbance (HCC)   Constipation   Weakness   Hypokalemia  * Failure to thrive in adult with mechanical fall, generalized weakness Reportedly fell on 06/02/2022 but without any injuries. Continue fall precautions.  Continue PT and OT while inpatient.  PT recommended discharge to SNF but authorization to SNF has been denied twice.  Appeal has been submitted on 10/3 and is currently pending.     Acute delirium Waxing and waning mental state.  Continue Seroquel   Parkinson's disease ([place]) Continue ropinirole and Sinemet   Type 2 diabetes mellitus with hyperglycemia Continue Semglee 12 units nightly.  NovoLog as needed for hyperglycemia.  Last hemoglobin A1c 5.5 on 06/04/2022.   Elevated liver enzymes Improved.  AST, ALT and ALP are normal.  Bilirubin is almost normal at 1.4   Rhabdomyolysis, hypokalemia, hyponatremia Resolved   Constipation Laxatives as needed   Dementia with behavioral disturbance (HCC) Continue psychotropics   DVT prophylaxis: SQ Lovenox Code Status: DNR Family Communication: Spouse Tarance Balan [phone] on 10/4 Disposition Plan: Status is: Inpatient Remains inpatient appropriate because: Unsafe discharge plan.  Patient's wife appealing [facility] denial   Level of care: Med-Surg  Consultants:  None  Procedures:  None  Antimicrobials: None    Subjective: Seen and examined.  Sitting up in chair.  No visible distress  Objective: Vitals:   06/12/22 0100 06/12/22 0600 06/12/22 0719  06/12/22 0838  BP: 112/62 121/66 109/71 124/83  Pulse: 68 72 67 67  Resp: '16 17 16 14  '$ Temp: 97.9 F (36.6 C) 98.3 F (36.8 C) 98.2 F (36.8 C) 97.9 F (36.6 C)  TempSrc: Oral Oral  Oral  SpO2: 95% 96% 98%   Weight:      Height:        Intake/Output Summary (Last 24 hours) at 06/12/2022 1156 Last data filed at 06/12/2022 0900 Gross per 24 hour  Intake 120 ml   Output 1400 ml  Net -1280 ml   Filed Weights   05/20/22 1527 05/21/22 0631 06/03/22 0300  Weight: 79 kg 76.7 kg 75.3 kg    Examination:  General exam: No acute distress.  Frail-appearing Respiratory system: Clear to auscultation. Respiratory effort normal. Cardiovascular system: S1-S2, RRR, no murmurs, no pedal edema Gastrointestinal system: Soft, thin, NT/ND, normal bowel sounds Central nervous system: Alert and oriented.  Resting tremor.  Flattened affect.  Dysarthric speech Extremities: Power decreased, bilaterally Skin: No rashes, lesions or ulcers Psychiatry: Judgement and insight appear normal. Mood & affect appropriate.     Data Reviewed: I have personally reviewed following labs and imaging studies  CBC: Recent Labs  Lab 06/12/22 0742  WBC 7.9  NEUTROABS 6.3  HGB 13.0  HCT 40.2  MCV 92.4  PLT 785   Basic Metabolic Panel: Recent Labs  Lab 06/08/22 0736 06/12/22 0742  NA 137 135  K 3.8 3.9  CL 101 101  CO2 29 30  GLUCOSE 104* 108*  BUN 20 11  CREATININE 0.90 0.85  CALCIUM 9.0 8.7*   GFR: Estimated Creatinine Clearance: 70.9 mL/min (by C-G formula based on SCr of 0.85 mg/dL). Liver Function Tests: Recent Labs  Lab 06/08/22 0736  AST 18  ALT 8  ALKPHOS 83  BILITOT 1.4*  PROT 6.3*  ALBUMIN 3.5   No results for input(s): "LIPASE", "AMYLASE" in the last 168 hours. No results for input(s): "AMMONIA" in the last 168 hours. Coagulation Profile: No results for input(s): "INR", "PROTIME" in the last 168 hours. Cardiac Enzymes: No results for input(s): "CKTOTAL", "CKMB", "CKMBINDEX", "TROPONINI" in the last 168 hours. BNP (last 3 results) No results for input(s): "PROBNP" in the last 8760 hours. HbA1C: No results for input(s): "HGBA1C" in the last 72 hours. CBG: Recent Labs  Lab 06/11/22 1553 06/11/22 2048 06/12/22 0338 06/12/22 0717 06/12/22 1148  GLUCAP 168* 210* 84 109* 247*   Lipid Profile: No results for input(s): "CHOL", "HDL",  "LDLCALC", "TRIG", "CHOLHDL", "LDLDIRECT" in the last 72 hours. Thyroid Function Tests: No results for input(s): "TSH", "T4TOTAL", "FREET4", "T3FREE", "THYROIDAB" in the last 72 hours. Anemia Panel: No results for input(s): "VITAMINB12", "FOLATE", "FERRITIN", "TIBC", "IRON", "RETICCTPCT" in the last 72 hours. Sepsis Labs: No results for input(s): "PROCALCITON", "LATICACIDVEN" in the last 168 hours.  No results found for this or any previous visit (from the past 240 hour(s)).       Radiology Studies: No results found.      Scheduled Meds:  carbidopa-levodopa  2 tablet Oral 5 X Daily   enoxaparin (LOVENOX) injection  40 mg Subcutaneous Q24H   feeding supplement  237 mL Oral TID BM   gabapentin  300 mg Oral TID   insulin aspart  0-9 Units Subcutaneous TID AC & HS   insulin glargine-yfgn  12 Units Subcutaneous QHS   linagliptin  5 mg Oral Daily   multivitamin with minerals  1 tablet Oral Daily   QUEtiapine  75 mg Oral QHS  rivastigmine  1.5 mg Oral BID   rOPINIRole  2 mg Oral 5 X Daily   sodium chloride flush  3 mL Intravenous Q12H   Continuous Infusions:   LOS: 26 days   Sidney Ace, MD [facility]   If 7PM-7AM, please contact night-coverage  06/12/2022, 11:56 AM

## 2022-06-13 DIAGNOSIS — R627 Adult failure to thrive: Secondary | ICD-10-CM | POA: Diagnosis not present

## 2022-06-13 LAB — GLUCOSE, CAPILLARY
Glucose-Capillary: 172 mg/dL — ABNORMAL HIGH (ref 70–99)
Glucose-Capillary: 219 mg/dL — ABNORMAL HIGH (ref 70–99)
Glucose-Capillary: 110 mg/dL — ABNORMAL HIGH (ref 70–99)
Glucose-Capillary: 168 mg/dL — ABNORMAL HIGH (ref 70–99)

## 2022-06-13 NOTE — Progress Notes (Signed)
Physical Therapy Treatment Patient Details Name: Randall [name] MRN: [id number] DOB: 1951-07-08 Today's Date: 06/13/2022   History of Present Illness Pt is a 71 year old male admitted after being found down on floor for 12 hours, admitted with significant rhabdomyolysis; PMH significant for  hypertension, hyperlipidemia, diabetes mellitus type 2, Parkinson's disease with dementia. Pt required haldol on 05/19/21 2/2 agitation/sundowning.    PT Comments    Pt was long sitting in bed upon arrival. He is A and oriented and greets author." I was wondering when you were coming." Supportive/concerned spouse present throughout. Pt continues to present with inconsistent abilities and assistance require. Parkinson's disease greatly impacting pt's abilities and DC safety. He was able to exit R side of bed, stand, and ambulate 200 ft with RW. Assistance required throughout. He would greatly benefit from SNF however if insurance continues to deny SNF coverage, recommend pt/spouse hire additional support staff to manage need at home. Will continue to benefit from as much PT at DC as able to maximize independence while decreasing caregiver burden. Author assisted pt with breakfast tray set up by opening all containers and placing needs without reach. Acute PT will continue to follow.    Recommendations for follow up therapy are one component of a multi-disciplinary discharge planning process, led by the attending physician.  Recommendations may be updated based on patient status, additional functional criteria and insurance authorization.  Follow Up Recommendations  Skilled nursing-short term rehab (<3 hours/day) (Will benefit from additional support (Other than elderly spouse) at home if unable to DC to SNF due to insurance denial)     Assistance Recommended at Discharge Frequent or constant Supervision/Assistance  Patient can return home with the following A lot of help with walking and/or transfers;A lot of help  with bathing/dressing/bathroom;Assistance with cooking/housework;Direct supervision/assist for medications management;Direct supervision/assist for financial management;Assist for transportation;Help with stairs or ramp for entrance   Equipment Recommendations  Rolling walker (2 wheels);BSC/3in1;Wheelchair (measurements PT);Wheelchair cushion (measurements PT)    Recommendations for Other Services       Precautions / Restrictions Precautions Precautions: Fall Precaution Comments: Aspiration Restrictions Weight Bearing Restrictions: No     Mobility  Bed Mobility Overal bed mobility: Needs Assistance Bed Mobility: Supine to Sit     Supine to sit: Min assist, HOB elevated     General bed mobility comments: pt continues to have initial posterior push upon sitting up but with vcs is able to correct with increased time and vcs. unable to maintain without constant cueing and occasional assist    Transfers Overall transfer level: Needs assistance Equipment used: Rolling walker (2 wheels) Transfers: Sit to/from Stand Sit to Stand: Min assist           General transfer comment: Once pt is able to have fwd wt shift, is able to stand with min assist. gait belt used throughout for safety    Ambulation/Gait Ambulation/Gait assistance: Min assist Gait Distance (Feet): 200 Feet Assistive device: Rolling walker (2 wheels) Gait Pattern/deviations: Step-to pattern, Shuffle, Narrow base of support, Step-through pattern Gait velocity: decreased     General Gait Details: Pt was able to advance gait distances today with overall improved sequencing but does require constant assistance for safety with vcs for staying inside RW and posture correction.Occasional shuffling/parkinsonian gait but with vcs is able to improve step quality          Cognition Arousal/Alertness: Awake/alert Behavior During Therapy: WFL for tasks assessed/performed Overall Cognitive Status: History of cognitive  impairments -  at baseline    General Comments: pt is alert and conversational throughout. difficult to understand but with increased time can get point across. Typical communication for parkinsons disease           General Comments General comments (skin integrity, edema, etc.): pt was repositioned in recliner with breakfast tray placed in front of him and all containers opened.Pt's spouse educated on R lateral lean conserns with instructions to have pillow on R side for safety.      Pertinent Vitals/Pain Pain Assessment Pain Assessment: No/denies pain (but has chronic L shoulder pain with arm elevation)     PT Goals (current goals can now be found in the care plan section) Acute Rehab PT Goals Patient Stated Goal: to get better    Frequency    Min 2X/week      PT Plan Current plan remains appropriate    Co-evaluation     PT goals addressed during session: Mobility/safety with mobility;Balance;Proper use of DME        AM-PAC PT "6 Clicks" Mobility   Outcome Measure  Help needed turning from your back to your side while in a flat bed without using bedrails?: A Lot Help needed moving from lying on your back to sitting on the side of a flat bed without using bedrails?: A Lot Help needed moving to and from a bed to a chair (including a wheelchair)?: A Lot Help needed standing up from a chair using your arms (e.g., wheelchair or bedside chair)?: A Lot Help needed to walk in hospital room?: A Lot Help needed climbing 3-5 steps with a railing? : A Lot 6 Click Score: 12    End of Session Equipment Utilized During Treatment: Gait belt Activity Tolerance: Patient tolerated treatment well Patient left: in chair;with call bell/phone within reach;with chair alarm set Nurse Communication: Mobility status PT Visit Diagnosis: Unsteadiness on feet (R26.81);History of falling (Z91.81);Muscle weakness (generalized) (M62.81);Other abnormalities of gait and mobility (R26.89)      Time: 2595-6387 PT Time Calculation (min) (ACUTE ONLY): 51 min  Charges:  $Gait Training: 8-22 mins $Therapeutic Activity: 8-22 mins $Neuromuscular Re-education: 8-22 mins                     Julaine Fusi PTA 06/13/22, 5:55 PM

## 2022-06-13 NOTE — Progress Notes (Addendum)
AOR faxed to [facility] @ [phone]. Called spoke with representative to confirm fax was received.   1:30 Spoke with Mrs. Mah. She stated she had been on the phone with a representative from [facility] who informed her the case was dismissed due not having the AOR completed. Mrs. Leider request RNCM contact [facility] for more information. Mrs. Housholder advised she would need to make arrangements for patient's return home. Advised HH may be an option. She stated she had no plan for patient to return home.   Contacted Rob Biasion, [facility] representative. He stated the appeal was dismissed due to not have completed the form in a timely manner and not being completed. Rob stated he had spoken with Mrs. Cappella several times to instruct her on how to complete the forms and she was advised of the deadline of 72 hours. Case remains dismissed.

## 2022-06-13 NOTE — Progress Notes (Signed)
PROGRESS NOTE    ESTANISLAO [name]  MGQ:[id number] DOB: 1951-08-15 DOA: 05/17/2022 PCP: Adin Hector, MD    Brief Narrative:  Randall [name] is a 71 y.o. male with medical history significant of hypertension, hyperlipidemia, diabetes mellitus type 2, Parkinson's disease with dementia presents after having a fall at home.  Patient was on the floor for about 12 hours.  By time he arrived in the hospital, he was found to have significant rhabdomyolysis with CK level of 8278.  Renal function still normal.  He was placed on IV fluids. Patient CK level was normalized on 9/12.  However, patient developed some confusion as well as dysphagia.  PT/OT recommended nursing home placement.     On 05/24/2022 urine very dark.  Patient given a fluid bolus and urine cleared up a little bit.  No blood in the urine.   On 05/25/2022.  Patient was having facial tremor and difficulty eating and taking meds.   05/26/2022 through 05/28/2022.  The patient has not been eating very much.  His urine is dark.  Bilirubin slightly high.  Previous urine analysis did not show any blood in the urine.  The patient wants to get out of the hospital.  On 05/28/2022 and his wife chose a bed at [facility] and insurance authorization is undergoing.   9/20: Received notification from [facility] that patient's insurance carrier has declined skilled nursing facility authorization.  Peer to peer with medical director at [facility] (Dr. Coralie Carpen at [phone]) was done by Dr. Priscella Mann.  Denial was upheld.   9/22: Denial upheld through [place].  TOC engaged and attempting to assist with assisted living facility placement.   9/27: Insurance authorization for SNF denied again per Donny Pique, Tourist information centre manager.   Assessment & Plan:   Principal Problem:   Failure to thrive in adult Active Problems:   Rhabdomyolysis   Acute delirium   Parkinson's disease   Fall at home, initial encounter   Leukocytosis   Hyponatremia   Type 2 diabetes mellitus with  hyperlipidemia (HCC)   Transaminitis   Hyperbilirubinemia   Dysphagia   Dementia with behavioral disturbance (HCC)   Constipation   Weakness   Hypokalemia  Failure to thrive in adult with mechanical fall, generalized weakness Reportedly fell on 06/02/2022 but without any injuries. Continue fall precautions.  Continue PT and OT while inpatient.  PT recommended discharge to SNF but authorization to SNF has been denied twice.  Appeal has been submitted on 10/3 and is currently pending.     Acute delirium Waxing and waning mental state.  Continue Seroquel qHS   Parkinson's disease ([place]) Continue ropinirole and Sinemet   Type 2 diabetes mellitus with hyperglycemia Continue Semglee 12 units nightly.  NovoLog as needed for hyperglycemia.  Last hemoglobin A1c 5.5 on 06/04/2022.   Elevated liver enzymes Improved.  AST, ALT and ALP are normal.  Bilirubin is almost normal at 1.4   Rhabdomyolysis, hypokalemia, hyponatremia Resolved   Constipation Laxatives as needed   Dementia with behavioral disturbance (HCC) Continue psychotropics   DVT prophylaxis: SQ Lovenox Code Status: DNR Family Communication: Spouse Barlow Harrison [phone] on 10/4, at bedside 10/5 Disposition Plan: Status is: Inpatient Remains inpatient appropriate because: Unsafe discharge plan.  Patient's wife appealing [facility] denial   Level of care: Med-Surg  Consultants:  None  Procedures:  None  Antimicrobials: None    Subjective: Seen and examined.  Sitting up in chair.  No visible distress  Objective: Vitals:   06/12/22 1625 06/12/22  2032 06/13/22 0355 06/13/22 0821  BP: 139/81 99/79 126/73 119/81  Pulse: 77 73 66 67  Resp: '18  18 18  '$ Temp: 97.8 F (36.6 C) 98.1 F (36.7 C) 97.6 F (36.4 C) 98.1 F (36.7 C)  TempSrc:      SpO2: 100% 98% 99% 99%  Weight:      Height:        Intake/Output Summary (Last 24 hours) at 06/13/2022 1027 Last data filed at 06/13/2022 0543 Gross per 24 hour  Intake --   Output 400 ml  Net -400 ml   Filed Weights   05/20/22 1527 05/21/22 0631 06/03/22 0300  Weight: 79 kg 76.7 kg 75.3 kg    Examination:  General exam: No acute distress.  Sitting up in bed Respiratory system: Clear to auscultation. Respiratory effort normal. Cardiovascular system: S1-S2, RRR, no murmurs, no pedal edema Gastrointestinal system: Soft, thin, NT/ND, normal bowel sounds Central nervous system: Alert and oriented.  Resting tremor.  Flattened affect.  Dysarthric speech Extremities: Power decreased, bilaterally Skin: No rashes, lesions or ulcers Psychiatry: Judgement and insight appear normal. Mood & affect appropriate.     Data Reviewed: I have personally reviewed following labs and imaging studies  CBC: Recent Labs  Lab 06/12/22 0742  WBC 7.9  NEUTROABS 6.3  HGB 13.0  HCT 40.2  MCV 92.4  PLT 024   Basic Metabolic Panel: Recent Labs  Lab 06/08/22 0736 06/12/22 0742  NA 137 135  K 3.8 3.9  CL 101 101  CO2 29 30  GLUCOSE 104* 108*  BUN 20 11  CREATININE 0.90 0.85  CALCIUM 9.0 8.7*   GFR: Estimated Creatinine Clearance: 70.9 mL/min (by C-G formula based on SCr of 0.85 mg/dL). Liver Function Tests: Recent Labs  Lab 06/08/22 0736  AST 18  ALT 8  ALKPHOS 83  BILITOT 1.4*  PROT 6.3*  ALBUMIN 3.5   No results for input(s): "LIPASE", "AMYLASE" in the last 168 hours. No results for input(s): "AMMONIA" in the last 168 hours. Coagulation Profile: No results for input(s): "INR", "PROTIME" in the last 168 hours. Cardiac Enzymes: No results for input(s): "CKTOTAL", "CKMB", "CKMBINDEX", "TROPONINI" in the last 168 hours. BNP (last 3 results) No results for input(s): "PROBNP" in the last 8760 hours. HbA1C: No results for input(s): "HGBA1C" in the last 72 hours. CBG: Recent Labs  Lab 06/12/22 0717 06/12/22 1148 06/12/22 1625 06/12/22 2107 06/13/22 0854  GLUCAP 109* 247* 141* 180* 172*   Lipid Profile: No results for input(s): "CHOL", "HDL",  "LDLCALC", "TRIG", "CHOLHDL", "LDLDIRECT" in the last 72 hours. Thyroid Function Tests: No results for input(s): "TSH", "T4TOTAL", "FREET4", "T3FREE", "THYROIDAB" in the last 72 hours. Anemia Panel: No results for input(s): "VITAMINB12", "FOLATE", "FERRITIN", "TIBC", "IRON", "RETICCTPCT" in the last 72 hours. Sepsis Labs: No results for input(s): "PROCALCITON", "LATICACIDVEN" in the last 168 hours.  No results found for this or any previous visit (from the past 240 hour(s)).       Radiology Studies: No results found.      Scheduled Meds:  carbidopa-levodopa  2 tablet Oral 5 X Daily   enoxaparin (LOVENOX) injection  40 mg Subcutaneous Q24H   feeding supplement  237 mL Oral TID BM   gabapentin  300 mg Oral TID   insulin aspart  0-9 Units Subcutaneous TID AC & HS   insulin glargine-yfgn  12 Units Subcutaneous QHS   linagliptin  5 mg Oral Daily   multivitamin with minerals  1 tablet Oral Daily   QUEtiapine  75 mg Oral QHS   rivastigmine  1.5 mg Oral BID   rOPINIRole  2 mg Oral 5 X Daily   sodium chloride flush  3 mL Intravenous Q12H   Continuous Infusions:   LOS: 27 days   Sidney Ace, MD [facility]   If 7PM-7AM, please contact night-coverage  06/13/2022, 10:27 AM

## 2022-06-14 DIAGNOSIS — R627 Adult failure to thrive: Secondary | ICD-10-CM | POA: Diagnosis not present

## 2022-06-14 LAB — GLUCOSE, CAPILLARY
Glucose-Capillary: 127 mg/dL — ABNORMAL HIGH (ref 70–99)
Glucose-Capillary: 211 mg/dL — ABNORMAL HIGH (ref 70–99)
Glucose-Capillary: 164 mg/dL — ABNORMAL HIGH (ref 70–99)
Glucose-Capillary: 130 mg/dL — ABNORMAL HIGH (ref 70–99)

## 2022-06-14 NOTE — Progress Notes (Signed)
[facility])  Request received to discuss hospice support with Mrs. Pring again.  He is eligible for hospice services in the home, but is not currently eligible for inpatient hospice.  From previous discussions, wife has recently had some type of surgery herself and she voiced concern that she just cannot care for him at home.  Attempted to speak with her today, no answer and her VM is not set up to receive messages.  Will continue to attempt to engage.  Thank you, Venia Carbon DNP, RN [facility] Liaison

## 2022-06-14 NOTE — Progress Notes (Signed)
PROGRESS NOTE    Randall [name]  MWN:[id number] DOB: 05-05-1951 DOA: 05/17/2022 PCP: Adin Hector, MD    Brief Narrative:  Randall [name] is a 71 y.o. male with medical history significant of hypertension, hyperlipidemia, diabetes mellitus type 2, Parkinson's disease with dementia presents after having a fall at home.  Patient was on the floor for about 12 hours.  By time he arrived in the hospital, he was found to have significant rhabdomyolysis with CK level of 8278.  Renal function still normal.  He was placed on IV fluids. Patient CK level was normalized on 9/12.  However, patient developed some confusion as well as dysphagia.  PT/OT recommended nursing home placement.     On 05/24/2022 urine very dark.  Patient given a fluid bolus and urine cleared up a little bit.  No blood in the urine.   On 05/25/2022.  Patient was having facial tremor and difficulty eating and taking meds.   05/26/2022 through 05/28/2022.  The patient has not been eating very much.  His urine is dark.  Bilirubin slightly high.  Previous urine analysis did not show any blood in the urine.  The patient wants to get out of the hospital.  On 05/28/2022 and his wife chose a bed at [facility] and insurance authorization is undergoing.   9/20: Received notification from [facility] that patient's insurance carrier has declined skilled nursing facility authorization.  Peer to peer with medical director at [facility] (Dr. Coralie Carpen at [phone]) was done by Dr. Priscella Mann.  Denial was upheld.   9/22: Denial upheld through [place].  TOC engaged and attempting to assist with assisted living facility placement.   9/27: Insurance authorization for SNF denied again per Donny Pique, Tourist information centre manager. 10/6: Patient may be eligible for hospice services at home.  Hospice liaison Santiago Glad engaged   Assessment & Plan:   Principal Problem:   Failure to thrive in adult Active Problems:   Rhabdomyolysis   Acute delirium   Parkinson's disease   Fall at  home, initial encounter   Leukocytosis   Hyponatremia   Type 2 diabetes mellitus with hyperlipidemia (HCC)   Transaminitis   Hyperbilirubinemia   Dysphagia   Dementia with behavioral disturbance (HCC)   Constipation   Weakness   Hypokalemia  Failure to thrive in adult with mechanical fall, generalized weakness Reportedly fell on 06/02/2022 but without any injuries. Continue fall precautions.  Continue PT and OT while inpatient.  PT recommended discharge to SNF but authorization to SNF has been denied twice.  Appeal has been submitted on 10/3 and is currently pending.  Patient is home with hospice appropriate.  Hospice liaison engaged.     Acute delirium Waxing and waning mental state.  Continue Seroquel qHS   Parkinson's disease ([place]) Continue ropinirole and Sinemet   Type 2 diabetes mellitus with hyperglycemia Continue Semglee 12 units nightly.  NovoLog as needed for hyperglycemia.  Last hemoglobin A1c 5.5 on 06/04/2022.   Elevated liver enzymes Improved.  AST, ALT and ALP are normal.  Bilirubin is almost normal at 1.4   Rhabdomyolysis, hypokalemia, hyponatremia Resolved   Constipation Laxatives as needed   Dementia with behavioral disturbance (HCC) Continue psychotropics   DVT prophylaxis: SQ Lovenox Code Status: DNR Family Communication: Spouse Randall [name] [phone] on 10/4, at bedside 10/5, via phone 10/6 Disposition Plan: Status is: Inpatient Remains inpatient appropriate because: Unsafe discharge plan.  Patient's wife appealing [facility] denial   Level of care: Med-Surg  Consultants:  None  Procedures:  None  Antimicrobials: None    Subjective: Seen and examined.  Sitting up in chair.  No visible distress  Objective: Vitals:   06/13/22 1602 06/13/22 2050 06/14/22 0514 06/14/22 0858  BP: (!) 137/112 111/75 (!) 155/81 131/87  Pulse: 72 (!) 58 83 70  Resp: '16 18 18 17  '$ Temp: 97.8 F (36.6 C) (!) 97.5 F (36.4 C) 98 F (36.7 C) 97.8 F (36.6 C)   TempSrc:      SpO2: 100% 98% 94% 100%  Weight:      Height:        Intake/Output Summary (Last 24 hours) at 06/14/2022 1612 Last data filed at 06/14/2022 1300 Gross per 24 hour  Intake 80 ml  Output 1250 ml  Net -1170 ml   Filed Weights   05/20/22 1527 05/21/22 0631 06/03/22 0300  Weight: 79 kg 76.7 kg 75.3 kg    Examination:  General exam: No acute distress.  Sitting up in bed Respiratory system: Clear to auscultation. Respiratory effort normal. Cardiovascular system: S1-S2, RRR, no murmurs, no pedal edema Gastrointestinal system: Soft, thin, NT/ND, normal bowel sounds Central nervous system: Alert and oriented.  Resting tremor.  Flattened affect.  Dysarthric speech Extremities: Power decreased, bilaterally Skin: No rashes, lesions or ulcers Psychiatry: Judgement and insight appear normal. Mood & affect appropriate.     Data Reviewed: I have personally reviewed following labs and imaging studies  CBC: Recent Labs  Lab 06/12/22 0742  WBC 7.9  NEUTROABS 6.3  HGB 13.0  HCT 40.2  MCV 92.4  PLT 409   Basic Metabolic Panel: Recent Labs  Lab 06/08/22 0736 06/12/22 0742  NA 137 135  K 3.8 3.9  CL 101 101  CO2 29 30  GLUCOSE 104* 108*  BUN 20 11  CREATININE 0.90 0.85  CALCIUM 9.0 8.7*   GFR: Estimated Creatinine Clearance: 70.9 mL/min (by C-G formula based on SCr of 0.85 mg/dL). Liver Function Tests: Recent Labs  Lab 06/08/22 0736  AST 18  ALT 8  ALKPHOS 83  BILITOT 1.4*  PROT 6.3*  ALBUMIN 3.5   No results for input(s): "LIPASE", "AMYLASE" in the last 168 hours. No results for input(s): "AMMONIA" in the last 168 hours. Coagulation Profile: No results for input(s): "INR", "PROTIME" in the last 168 hours. Cardiac Enzymes: No results for input(s): "CKTOTAL", "CKMB", "CKMBINDEX", "TROPONINI" in the last 168 hours. BNP (last 3 results) No results for input(s): "PROBNP" in the last 8760 hours. HbA1C: No results for input(s): "HGBA1C" in the last 72  hours. CBG: Recent Labs  Lab 06/13/22 1134 06/13/22 1612 06/13/22 2018 06/14/22 0739 06/14/22 1218  GLUCAP 219* 110* 168* 127* 211*   Lipid Profile: No results for input(s): "CHOL", "HDL", "LDLCALC", "TRIG", "CHOLHDL", "LDLDIRECT" in the last 72 hours. Thyroid Function Tests: No results for input(s): "TSH", "T4TOTAL", "FREET4", "T3FREE", "THYROIDAB" in the last 72 hours. Anemia Panel: No results for input(s): "VITAMINB12", "FOLATE", "FERRITIN", "TIBC", "IRON", "RETICCTPCT" in the last 72 hours. Sepsis Labs: No results for input(s): "PROCALCITON", "LATICACIDVEN" in the last 168 hours.  No results found for this or any previous visit (from the past 240 hour(s)).       Radiology Studies: No results found.      Scheduled Meds:  carbidopa-levodopa  2 tablet Oral 5 X Daily   enoxaparin (LOVENOX) injection  40 mg Subcutaneous Q24H   feeding supplement  237 mL Oral TID BM   gabapentin  300 mg Oral TID   insulin aspart  0-9 Units Subcutaneous TID  AC & HS   insulin glargine-yfgn  12 Units Subcutaneous QHS   linagliptin  5 mg Oral Daily   multivitamin with minerals  1 tablet Oral Daily   QUEtiapine  75 mg Oral QHS   rivastigmine  1.5 mg Oral BID   rOPINIRole  2 mg Oral 5 X Daily   sodium chloride flush  3 mL Intravenous Q12H   Continuous Infusions:   LOS: 28 days   Sidney Ace, MD [facility]   If 7PM-7AM, please contact night-coverage  06/14/2022, 4:12 PM

## 2022-06-14 NOTE — Progress Notes (Signed)
[facility]) Hospital Liaison note:  This patient is currently enrolled in [facility] outpatient-based Palliative Care. Will continue to follow for disposition.  Please call with any outpatient palliative questions or concerns.  Thank you, Lorelee Market, LPN [facility] Liaison [phone]

## 2022-06-14 NOTE — Progress Notes (Signed)
Spoke with patient's wife. She had questions about patient's appeal being dismissed. Advised she would need to contact [facility] and speak with a representative directlyf.   RNCM reiterated patient's was denied by [facility] due to his LOC being custodial in nature. She was advised if she wishes to proceed with [facility] it would be private pay. She stated she could not afford that. She stated she has family in the area but they are unable to assist with patient's care at home.

## 2022-06-14 NOTE — Plan of Care (Signed)

## 2022-06-14 NOTE — Care Management Important Message (Signed)
Important Message  Patient Details  Name: Randall [name] MRN: [id number] Date of Birth: 1951/03/27   Medicare Important Message Given:  Yes     Dannette Barbara 06/14/2022, 10:42 AM

## 2022-06-15 DIAGNOSIS — R627 Adult failure to thrive: Secondary | ICD-10-CM | POA: Diagnosis not present

## 2022-06-15 LAB — GLUCOSE, CAPILLARY
Glucose-Capillary: 127 mg/dL — ABNORMAL HIGH (ref 70–99)
Glucose-Capillary: 198 mg/dL — ABNORMAL HIGH (ref 70–99)
Glucose-Capillary: 130 mg/dL — ABNORMAL HIGH (ref 70–99)
Glucose-Capillary: 180 mg/dL — ABNORMAL HIGH (ref 70–99)

## 2022-06-15 NOTE — Progress Notes (Signed)
PROGRESS NOTE    Randall [name]  IWL:[id number] DOB: 09-23-1950 DOA: 05/17/2022 PCP: Adin Hector, MD    Brief Narrative:  Randall [name] is a 71 y.o. male with medical history significant of hypertension, hyperlipidemia, diabetes mellitus type 2, Parkinson's disease with dementia presents after having a fall at home.  Patient was on the floor for about 12 hours.  By time he arrived in the hospital, he was found to have significant rhabdomyolysis with CK level of 8278.  Renal function still normal.  He was placed on IV fluids. Patient CK level was normalized on 9/12.  However, patient developed some confusion as well as dysphagia.  PT/OT recommended nursing home placement.     On 05/24/2022 urine very dark.  Patient given a fluid bolus and urine cleared up a little bit.  No blood in the urine.   On 05/25/2022.  Patient was having facial tremor and difficulty eating and taking meds.   05/26/2022 through 05/28/2022.  The patient has not been eating very much.  His urine is dark.  Bilirubin slightly high.  Previous urine analysis did not show any blood in the urine.  The patient wants to get out of the hospital.  On 05/28/2022 and his wife chose a bed at [place] and insurance authorization is undergoing.   9/20: Received notification from [facility] that patient's insurance carrier has declined skilled nursing facility authorization.  Peer to peer with medical director at [facility] (Dr. Coralie Carpen at [phone]) was done by Dr. Priscella Mann.  Denial was upheld.   9/22: Denial upheld through [place].  TOC engaged and attempting to assist with assisted living facility placement.   9/27: Insurance authorization for SNF denied again per Donny Pique, Tourist information centre manager. 10/6: Patient may be eligible for hospice services at home.  Hospice liaison Santiago Glad engaged  10/7: Discussed with wife, hospice liaison, TOC.  Patient is now accepted to home with hospice services.  Wife is in agreement.  Plan on discharge Monday  10/9   Assessment & Plan:   Principal Problem:   Failure to thrive in adult Active Problems:   Rhabdomyolysis   Acute delirium   Parkinson's disease   Fall at home, initial encounter   Leukocytosis   Hyponatremia   Type 2 diabetes mellitus with hyperlipidemia (HCC)   Transaminitis   Hyperbilirubinemia   Dysphagia   Dementia with behavioral disturbance (HCC)   Constipation   Weakness   Hypokalemia  Failure to thrive in adult with mechanical fall, generalized weakness Reportedly fell on 06/02/2022 but without any injuries. Continue fall precautions.  Continue PT and OT while inpatient.  PT recommended discharge to SNF but authorization to SNF has been denied twice.  Appeal has been submitted on 10/3 and is currently pending.  Patient is home with hospice appropriate.  Hospice liaison engaged.  Patient is excepted to home with hospice services.  Next admission availability is Tuesday 10/10.  In order to support wife and ensure a safe discharge is possible we will plan on discharge Monday 10/9   Acute delirium Waxing and waning mental state.  Has been improving over the past couple days.  Continue Seroquel qHS   Parkinson's disease ([place]) Continue ropinirole and Sinemet   Type 2 diabetes mellitus with hyperglycemia Continue Semglee 12 units nightly.  NovoLog as needed for hyperglycemia.  Last hemoglobin A1c 5.5 on 06/04/2022.   Elevated liver enzymes Improved.  AST, ALT and ALP are normal.  Bilirubin is almost normal at 1.4  Rhabdomyolysis, hypokalemia, hyponatremia Resolved   Constipation Laxatives as needed   Dementia with behavioral disturbance ([place]) Continue psychotropics   DVT prophylaxis: SQ Lovenox Code Status: DNR Family Communication: Spouse Eulice Rutledge [phone] on 10/4, at bedside 10/5, via phone 10/6, bedside 10/7 Disposition Plan: Status is: Inpatient Remains inpatient appropriate because: Unsafe discharge plan.  Disposition plan Home with hospice  services.  Plan to discharge 10/9   Level of care: Med-Surg  Consultants:  None  Procedures:  None  Antimicrobials: None    Subjective: Seen and examined.  Sitting up in chair.  No visible distress  Objective: Vitals:   06/14/22 1732 06/14/22 1945 06/15/22 0545 06/15/22 0723  BP: 114/73 131/79 (!) 150/102 (!) 84/62  Pulse: 79 76 87 67  Resp: '16 18 20 16  '$ Temp: 97.9 F (36.6 C) 98.4 F (36.9 C) 97.7 F (36.5 C) 98 F (36.7 C)  TempSrc:    Oral  SpO2: 90% 98%  98%  Weight:      Height:        Intake/Output Summary (Last 24 hours) at 06/15/2022 1053 Last data filed at 06/15/2022 6440 Gross per 24 hour  Intake 80 ml  Output 500 ml  Net -420 ml   Filed Weights   05/20/22 1527 05/21/22 0631 06/03/22 0300  Weight: 79 kg 76.7 kg 75.3 kg    Examination:  General exam: NAD.  Attempting to eat.  Sitting up in bed.  Mental status intact. Respiratory system: Clear to auscultation. Respiratory effort normal. Cardiovascular system: S1-S2, RRR, no murmurs, no pedal edema Gastrointestinal system: Soft, thin, NT/ND, normal bowel sounds Central nervous system: Alert and oriented.  Resting tremor.  Flattened affect.  Dysarthric speech Extremities: Power decreased, bilaterally Skin: No rashes, lesions or ulcers Psychiatry: Judgement and insight appear normal. Mood & affect flattened.     Data Reviewed: I have personally reviewed following labs and imaging studies  CBC: Recent Labs  Lab 06/12/22 0742  WBC 7.9  NEUTROABS 6.3  HGB 13.0  HCT 40.2  MCV 92.4  PLT 347   Basic Metabolic Panel: Recent Labs  Lab 06/12/22 0742  NA 135  K 3.9  CL 101  CO2 30  GLUCOSE 108*  BUN 11  CREATININE 0.85  CALCIUM 8.7*   GFR: Estimated Creatinine Clearance: 70.9 mL/min (by C-G formula based on SCr of 0.85 mg/dL). Liver Function Tests: No results for input(s): "AST", "ALT", "ALKPHOS", "BILITOT", "PROT", "ALBUMIN" in the last 168 hours.  No results for input(s): "LIPASE",  "AMYLASE" in the last 168 hours. No results for input(s): "AMMONIA" in the last 168 hours. Coagulation Profile: No results for input(s): "INR", "PROTIME" in the last 168 hours. Cardiac Enzymes: No results for input(s): "CKTOTAL", "CKMB", "CKMBINDEX", "TROPONINI" in the last 168 hours. BNP (last 3 results) No results for input(s): "PROBNP" in the last 8760 hours. HbA1C: No results for input(s): "HGBA1C" in the last 72 hours. CBG: Recent Labs  Lab 06/14/22 0739 06/14/22 1218 06/14/22 1712 06/14/22 2056 06/15/22 0725  GLUCAP 127* 211* 164* 130* 127*   Lipid Profile: No results for input(s): "CHOL", "HDL", "LDLCALC", "TRIG", "CHOLHDL", "LDLDIRECT" in the last 72 hours. Thyroid Function Tests: No results for input(s): "TSH", "T4TOTAL", "FREET4", "T3FREE", "THYROIDAB" in the last 72 hours. Anemia Panel: No results for input(s): "VITAMINB12", "FOLATE", "FERRITIN", "TIBC", "IRON", "RETICCTPCT" in the last 72 hours. Sepsis Labs: No results for input(s): "PROCALCITON", "LATICACIDVEN" in the last 168 hours.  No results found for this or any previous visit (from the past 240  hour(s)).       Radiology Studies: No results found.      Scheduled Meds:  carbidopa-levodopa  2 tablet Oral 5 X Daily   enoxaparin (LOVENOX) injection  40 mg Subcutaneous Q24H   feeding supplement  237 mL Oral TID BM   gabapentin  300 mg Oral TID   insulin aspart  0-9 Units Subcutaneous TID AC & HS   insulin glargine-yfgn  12 Units Subcutaneous QHS   linagliptin  5 mg Oral Daily   multivitamin with minerals  1 tablet Oral Daily   QUEtiapine  75 mg Oral QHS   rivastigmine  1.5 mg Oral BID   rOPINIRole  2 mg Oral 5 X Daily   sodium chloride flush  3 mL Intravenous Q12H   Continuous Infusions:   LOS: 29 days   Sidney Ace, MD [facility]   If 7PM-7AM, please contact night-coverage  06/15/2022, 10:53 AM

## 2022-06-15 NOTE — Progress Notes (Signed)
Barrister's clerk Note  New referral received for hospice services at home upon discharge from the hospital.  This RN had a lengthy conversation with Beaux Wedemeyer, patient's wife today regarding hospice services.  Active listening and support given to [facility] who is very overwhelmed and concerned with taking Mr. Printup home.  After our conversation, she request hospice services upon discharge from the hospital.  She states she does not feel Mr. Morefield needs a hospital bed at this time.  She would like to get him home and settled and have one ordered if needed at a later time.  No other DME needs identified.  Current DME in the home:  [facility] Suite, walker, rollator.  Supplies needed at admission:  small gloves, large pull-ups and chux.   Information sent to referral intake.  Ms. Sprung will need same day or next day admission support.  We currently do not have admission availability until Tuesday 10.10.23.  Dominica Severin and Dr. Priscella Mann notified.  Thank you for allowing participation in this patient's care. We will continue to follow through final discharge disposition.  Dimas Aguas, RN [facility] [phone]

## 2022-06-15 NOTE — Plan of Care (Signed)

## 2022-06-15 NOTE — TOC Progression Note (Signed)
Transition of Care [facility]) - Progression Note    Patient Details  Name: Randall [name] MRN: [id number] Date of Birth: 11/10/1950  Transition of Care [facility]) CM/SW Contact  Izola Price, RN Phone Number: 06/15/2022, 10:07 AM  Clinical Narrative: 06/15/22: Notified hospice liaison at [phone] that per provider, spouse is currently at bedside. Prior liaison's has difficulty reaching spouse on 06/14/22. Per provider this is a hospice referral for this patient.  Simmie Davies RN CM    Expected Discharge Plan: [place] Barriers to Discharge: Continued Medical Work up  Expected Discharge Plan and Services Expected Discharge Plan: [place] Choice: [place] arrangements for the past 2 months: Single Family Home                                       Social Determinants of Health (SDOH) Interventions Utilities Interventions: Intervention Not Indicated  Readmission Risk Interventions     No data to display

## 2022-06-16 DIAGNOSIS — R627 Adult failure to thrive: Secondary | ICD-10-CM | POA: Diagnosis not present

## 2022-06-16 LAB — GLUCOSE, CAPILLARY
Glucose-Capillary: 116 mg/dL — ABNORMAL HIGH (ref 70–99)
Glucose-Capillary: 223 mg/dL — ABNORMAL HIGH (ref 70–99)
Glucose-Capillary: 133 mg/dL — ABNORMAL HIGH (ref 70–99)
Glucose-Capillary: 150 mg/dL — ABNORMAL HIGH (ref 70–99)
Glucose-Capillary: 125 mg/dL — ABNORMAL HIGH (ref 70–99)

## 2022-06-16 NOTE — Progress Notes (Signed)
PROGRESS NOTE    Randall [name]  OEV:[id number] DOB: [birth year] DOA: 05/17/2022 PCP: Adin Hector, MD    Brief Narrative:  Randall [name] is a 71 y.o. male with medical history significant of hypertension, hyperlipidemia, diabetes mellitus type 2, Parkinson's disease with dementia presents after having a fall at home.  Patient was on the floor for about 12 hours.  By time he arrived in the hospital, he was found to have significant rhabdomyolysis with CK level of 8278.  Renal function still normal.  He was placed on IV fluids. Patient CK level was normalized on 9/12.  However, patient developed some confusion as well as dysphagia.  PT/OT recommended nursing home placement.     On 05/24/2022 urine very dark.  Patient given a fluid bolus and urine cleared up a little bit.  No blood in the urine.   On 05/25/2022.  Patient was having facial tremor and difficulty eating and taking meds.   05/26/2022 through 05/28/2022.  The patient has not been eating very much.  His urine is dark.  Bilirubin slightly high.  Previous urine analysis did not show any blood in the urine.  The patient wants to get out of the hospital.  On 05/28/2022 and his wife chose a bed at [place] and insurance authorization is undergoing.   9/20: Received notification from [facility] that patient's insurance carrier has declined skilled nursing facility authorization.  Peer to peer with medical director at [facility] (Dr. Coralie Carpen at [phone]) was done by Dr. Priscella Mann.  Denial was upheld.   9/22: Denial upheld through [place].  TOC engaged and attempting to assist with assisted living facility placement.   9/27: Insurance authorization for SNF denied again per Donny Pique, Tourist information centre manager. 10/6: Patient may be eligible for hospice services at home.  Hospice liaison Santiago Glad engaged  10/7: Discussed with wife, hospice liaison, TOC.  Patient is now accepted to home with hospice services.  Wife is in agreement.  Plan on discharge Monday  10/9   Assessment & Plan:   Principal Problem:   Failure to thrive in adult Active Problems:   Rhabdomyolysis   Acute delirium   Parkinson's disease   Fall at home, initial encounter   Leukocytosis   Hyponatremia   Type 2 diabetes mellitus with hyperlipidemia (HCC)   Transaminitis   Hyperbilirubinemia   Dysphagia   Dementia with behavioral disturbance (HCC)   Constipation   Weakness   Hypokalemia  Failure to thrive in adult with mechanical fall, generalized weakness Reportedly fell on 06/02/2022 but without any injuries. Continue fall precautions.  Continue PT and OT while inpatient.  PT recommended discharge to SNF but authorization to SNF has been denied twice.  Appeal has been submitted on 10/3 and is currently pending.  Patient is home with hospice appropriate.  Hospice liaison engaged.  Patient is excepted to home with hospice services.  Next admission availability is Tuesday 10/10.  In order to support wife and ensure a safe discharge is possible we will plan on discharge Monday 10/9.  Continue supportive care.   Acute delirium Waxing and waning mental state.  Has been improving over the past couple days.  Continue Seroquel qHS   Parkinson's disease ([place]) Continue ropinirole and Sinemet   Type 2 diabetes mellitus with hyperglycemia Continue Semglee 12 units nightly.  NovoLog as needed for hyperglycemia.  Last hemoglobin A1c 5.5 on 06/04/2022.   Elevated liver enzymes Improved.  AST, ALT and ALP are normal.  Bilirubin is almost normal  at 1.4   Rhabdomyolysis, hypokalemia, hyponatremia Resolved   Constipation Laxatives as needed   Dementia with behavioral disturbance ([place]) Continue psychotropics   DVT prophylaxis: SQ Lovenox Code Status: DNR Family Communication: Spouse Randall [name] [phone] on 10/4, at bedside 10/5, via phone 10/6, bedside 10/7 Disposition Plan: Status is: Inpatient Remains inpatient appropriate because: Unsafe discharge plan.  Disposition  plan Home with hospice services.  Plan to discharge 10/9   Level of care: Med-Surg  Consultants:  None  Procedures:  None  Antimicrobials: None    Subjective: Seen and examined.  Sitting up in bed eating with assistance of patient care tech.  No visible distress  Objective: Vitals:   06/15/22 1601 06/15/22 2004 06/16/22 0512 06/16/22 0908  BP: (!) 150/80 115/72 135/77 (!) 150/100  Pulse: 84 77 70 84  Resp: '18 18 16 16  '$ Temp: 98.2 F (36.8 C) 97.9 F (36.6 C) (!) 97.5 F (36.4 C) 98.7 F (37.1 C)  TempSrc:  Oral Oral   SpO2: 99% 99% 99% 100%  Weight:      Height:        Intake/Output Summary (Last 24 hours) at 06/16/2022 0948 Last data filed at 06/16/2022 0656 Gross per 24 hour  Intake --  Output 475 ml  Net -475 ml   Filed Weights   05/20/22 1527 05/21/22 0631 06/03/22 0300  Weight: 79 kg 76.7 kg 75.3 kg    Examination:  General exam: NAD.  Attempting to eat.  Sitting up in bed.  Mental status intact. Respiratory system: Clear to auscultation. Respiratory effort normal. Cardiovascular system: S1-S2, RRR, no murmurs, no pedal edema Gastrointestinal system: Soft, thin, NT/ND, normal bowel sounds Central nervous system: Alert and oriented.  Resting tremor.  Flattened affect.  Dysarthric speech Extremities: Power decreased, bilaterally Skin: No rashes, lesions or ulcers Psychiatry: Judgement and insight appear normal. Mood & affect flattened.     Data Reviewed: I have personally reviewed following labs and imaging studies  CBC: Recent Labs  Lab 06/12/22 0742  WBC 7.9  NEUTROABS 6.3  HGB 13.0  HCT 40.2  MCV 92.4  PLT 595   Basic Metabolic Panel: Recent Labs  Lab 06/12/22 0742  NA 135  K 3.9  CL 101  CO2 30  GLUCOSE 108*  BUN 11  CREATININE 0.85  CALCIUM 8.7*   GFR: Estimated Creatinine Clearance: 70.9 mL/min (by C-G formula based on SCr of 0.85 mg/dL). Liver Function Tests: No results for input(s): "AST", "ALT", "ALKPHOS", "BILITOT",  "PROT", "ALBUMIN" in the last 168 hours.  No results for input(s): "LIPASE", "AMYLASE" in the last 168 hours. No results for input(s): "AMMONIA" in the last 168 hours. Coagulation Profile: No results for input(s): "INR", "PROTIME" in the last 168 hours. Cardiac Enzymes: No results for input(s): "CKTOTAL", "CKMB", "CKMBINDEX", "TROPONINI" in the last 168 hours. BNP (last 3 results) No results for input(s): "PROBNP" in the last 8760 hours. HbA1C: No results for input(s): "HGBA1C" in the last 72 hours. CBG: Recent Labs  Lab 06/15/22 0725 06/15/22 1140 06/15/22 1603 06/15/22 2136 06/16/22 0904  GLUCAP 127* 198* 130* 180* 116*   Lipid Profile: No results for input(s): "CHOL", "HDL", "LDLCALC", "TRIG", "CHOLHDL", "LDLDIRECT" in the last 72 hours. Thyroid Function Tests: No results for input(s): "TSH", "T4TOTAL", "FREET4", "T3FREE", "THYROIDAB" in the last 72 hours. Anemia Panel: No results for input(s): "VITAMINB12", "FOLATE", "FERRITIN", "TIBC", "IRON", "RETICCTPCT" in the last 72 hours. Sepsis Labs: No results for input(s): "PROCALCITON", "LATICACIDVEN" in the last 168 hours.  No results  found for this or any previous visit (from the past 240 hour(s)).       Radiology Studies: No results found.      Scheduled Meds:  carbidopa-levodopa  2 tablet Oral 5 X Daily   enoxaparin (LOVENOX) injection  40 mg Subcutaneous Q24H   feeding supplement  237 mL Oral TID BM   gabapentin  300 mg Oral TID   insulin aspart  0-9 Units Subcutaneous TID AC & HS   insulin glargine-yfgn  12 Units Subcutaneous QHS   linagliptin  5 mg Oral Daily   multivitamin with minerals  1 tablet Oral Daily   QUEtiapine  75 mg Oral QHS   rivastigmine  1.5 mg Oral BID   rOPINIRole  2 mg Oral 5 X Daily   sodium chloride flush  3 mL Intravenous Q12H   Continuous Infusions:   LOS: 30 days   Sidney Ace, MD [facility]   If 7PM-7AM, please contact night-coverage  06/16/2022, 9:48 AM

## 2022-06-16 NOTE — Progress Notes (Addendum)
Physical Therapy Treatment Patient Details Name: Randall [name] MRN: [id number] DOB: 13-Oct-1950 Today's Date: 06/16/2022   History of Present Illness Pt is a 71 year old male admitted after being found down on floor for 12 hours, admitted with significant rhabdomyolysis; PMH significant for  hypertension, hyperlipidemia, diabetes mellitus type 2, Parkinson's disease with dementia. Pt required haldol on 05/19/21 2/2 agitation/sundowning.    PT Comments    Pt in bed, awake.  Willing to participate.  To EOB with heavy assist.  Generally poor sitting balance today and unable to remain sitting for even brief periods of time.  Participated in exercises as described below. He tries to stand x 2 with max a/dependant assist +1 but is unable to even start to lift hips off bed and further attempts were stopped for pt and staff safety.  He does remain sitting throughout session with min/mod a x 1.   Returns to bed with max/dependant assist. Tremors noted throughout.  Per notes, pt does do better in AM but  PM session was necessary today.  Pt does seem to have a wide range of mobility based on meds and time of day.  Little to no ability to assist with bed mobility or transfers this pm and very poor sitting balance.   Recommendations for follow up therapy are one component of a multi-disciplinary discharge planning process, led by the attending physician.  Recommendations may be updated based on patient status, additional functional criteria and insurance authorization.  Follow Up Recommendations  Skilled nursing-short term rehab (<3 hours/day)     Assistance Recommended at Discharge Frequent or constant Supervision/Assistance  Patient can return home with the following    Equipment Recommendations  Rolling walker (2 wheels);BSC/3in1;Wheelchair (measurements PT);Wheelchair cushion (measurements PT)    Recommendations for Other Services       Precautions / Restrictions Precautions Precautions:  Fall Precaution Comments: Aspiration Restrictions Weight Bearing Restrictions: No     Mobility  Bed Mobility Overal bed mobility: Needs Assistance Bed Mobility: Supine to Sit, Sit to Supine     Supine to sit: Mod assist, Max assist Sit to supine: Mod assist, Max assist        Transfers Overall transfer level: Needs assistance Equipment used: Rolling walker (2 wheels) Transfers: Sit to/from Stand Sit to Stand: Total assist           General transfer comment: unable to clear hips off bed despite assist    Ambulation/Gait                   Stairs             Wheelchair Mobility    Modified Rankin (Stroke Patients Only)       Balance Overall balance assessment: Needs assistance Sitting-balance support: Feet supported, Bilateral upper extremity supported Sitting balance-Leahy Scale: Poor Sitting balance - Comments: hands on assist at all times.  difficutly placing gait belt as he would fall back rigth without assist Postural control: Posterior lean, Right lateral lean                                  Cognition Arousal/Alertness: Awake/alert Behavior During Therapy: WFL for tasks assessed/performed Overall Cognitive Status: History of cognitive impairments - at baseline  Exercises Other Exercises Other Exercises: sitting x 15 minutes EOB, seated AROM    General Comments        Pertinent Vitals/Pain Pain Assessment Pain Assessment: No/denies pain    Home Living                          Prior Function            PT Goals (current goals can now be found in the care plan section) Progress towards PT goals: Not progressing toward goals - comment    Frequency    Min 2X/week      PT Plan Current plan remains appropriate    Co-evaluation              AM-PAC PT "6 Clicks" Mobility   Outcome Measure  Help needed turning from your back to  your side while in a flat bed without using bedrails?: Total Help needed moving from lying on your back to sitting on the side of a flat bed without using bedrails?: Total Help needed moving to and from a bed to a chair (including a wheelchair)?: Total Help needed standing up from a chair using your arms (e.g., wheelchair or bedside chair)?: Total Help needed to walk in hospital room?: Total Help needed climbing 3-5 steps with a railing? : Total 6 Click Score: 6    End of Session Equipment Utilized During Treatment: Gait belt Activity Tolerance: Patient tolerated treatment well Patient left: in bed;with bed alarm set;with call bell/phone within reach Nurse Communication: Mobility status PT Visit Diagnosis: Unsteadiness on feet (R26.81);History of falling (Z91.81);Muscle weakness (generalized) (M62.81);Other abnormalities of gait and mobility (R26.89)     Time: 1535-1550 PT Time Calculation (min) (ACUTE ONLY): 15 min  Charges:  $Therapeutic Activity: 8-22 mins                   Chesley Noon, PTA 06/16/22, 3:53 PM

## 2022-06-16 NOTE — Progress Notes (Signed)
[facility] Liaison Note  Referral received 10.7.2023.  [facility] will continue to follow through final disposition.  No discharge plans today.  Dimas Aguas, [place] [phone]

## 2022-06-17 DIAGNOSIS — R627 Adult failure to thrive: Secondary | ICD-10-CM | POA: Diagnosis not present

## 2022-06-17 LAB — GLUCOSE, CAPILLARY
Glucose-Capillary: 174 mg/dL — ABNORMAL HIGH (ref 70–99)
Glucose-Capillary: 142 mg/dL — ABNORMAL HIGH (ref 70–99)
Glucose-Capillary: 197 mg/dL — ABNORMAL HIGH (ref 70–99)
Glucose-Capillary: 133 mg/dL — ABNORMAL HIGH (ref 70–99)

## 2022-06-17 NOTE — Plan of Care (Signed)

## 2022-06-17 NOTE — Progress Notes (Signed)
PROGRESS NOTE    Randall [name]  ION:[id number] DOB: July 20, 1951 DOA: 05/17/2022 PCP: Adin Hector, MD    Brief Narrative:  Randall [name] is a 71 y.o. male with medical history significant of hypertension, hyperlipidemia, diabetes mellitus type 2, Parkinson's disease with dementia presents after having a fall at home.  Patient was on the floor for about 12 hours.  By time he arrived in the hospital, he was found to have significant rhabdomyolysis with CK level of 8278.  Renal function still normal.  He was placed on IV fluids. Patient CK level was normalized on 9/12.  However, patient developed some confusion as well as dysphagia.  PT/OT recommended nursing home placement.     On 05/24/2022 urine very dark.  Patient given a fluid bolus and urine cleared up a little bit.  No blood in the urine.   On 05/25/2022.  Patient was having facial tremor and difficulty eating and taking meds.   05/26/2022 through 05/28/2022.  The patient has not been eating very much.  His urine is dark.  Bilirubin slightly high.  Previous urine analysis did not show any blood in the urine.  The patient wants to get out of the hospital.  On 05/28/2022 and his wife chose a bed at [facility] and insurance authorization is undergoing.   9/20: Received notification from [facility] that patient's insurance carrier has declined skilled nursing facility authorization.  Peer to peer with medical director at [facility] (Dr. Coralie Carpen at [phone]) was done by Dr. Priscella Mann.  Denial was upheld.   9/22: Denial upheld through [facility].  TOC engaged and attempting to assist with assisted living facility placement.   9/27: Insurance authorization for SNF denied again per Donny Pique, Tourist information centre manager. 10/6: Patient may be eligible for hospice services at home.  Hospice liaison Santiago Glad engaged  10/7: Discussed with wife, hospice liaison, TOC.  Patient is now accepted to home with hospice services.  Wife is in agreement.  Plan on discharge Monday  10/9  10/9: Hospice liaison spoke to patient's wife who now is saying that she is unable to handle patient at home even with the assistance of hospice services.  We are lacking disposition plan.  Senior Case management involved to assist in disposition planning.   Assessment & Plan:   Principal Problem:   Failure to thrive in adult Active Problems:   Rhabdomyolysis   Acute delirium   Parkinson's disease   Fall at home, initial encounter   Leukocytosis   Hyponatremia   Type 2 diabetes mellitus with hyperlipidemia (HCC)   Transaminitis   Hyperbilirubinemia   Dysphagia   Dementia with behavioral disturbance (HCC)   Constipation   Weakness   Hypokalemia  Failure to thrive in adult with mechanical fall, generalized weakness Reportedly fell on 06/02/2022 but without any injuries. Continue fall precautions.  Continue PT and OT while inpatient.  PT recommended discharge to SNF but authorization to SNF has been denied twice.  Appeal has been submitted on 10/3 and is currently pending.  Patient is home with hospice appropriate.  Hospice liaison engaged.   Plan: Hospice liaison reach out to patient's wife on 10/9.  She indicates that she has a fractured knee and is unable to care for patient at home even with the assistance of home hospice services.  This was supposedly discussed with her over the weekend and I have discussed this with her multiple times.  At this point the patient is chronic, custodial care.  His acute issues  have resolved.  His current issues are chronic in nature and unlikely to resolve with continued hospitalization.  I therapist had made repeated recommendations for skilled nursing facility however unfortunately insurance has denied placement in skilled nursing facility twice.    Acute delirium Waxing and waning mental state.  Has been improving over the past couple days.  Continue Seroquel qHS   Parkinson's disease ([place]) Continue ropinirole and Sinemet   Type 2  diabetes mellitus with hyperglycemia Continue Semglee 12 units nightly.  NovoLog as needed for hyperglycemia.  Last hemoglobin A1c 5.5 on 06/04/2022.   Elevated liver enzymes Improved.  AST, ALT and ALP are normal.  Bilirubin is almost normal at 1.4   Rhabdomyolysis, hypokalemia, hyponatremia Resolved   Constipation Laxatives as needed   Dementia with behavioral disturbance (HCC) Continue psychotropics   DVT prophylaxis: SQ Lovenox Code Status: DNR Family Communication: Spouse [name] [phone] on 10/4, at bedside 10/5, via phone 10/6, bedside 10/7 Disposition Plan: Status is: Inpatient Remains inpatient appropriate because: Unsafe discharge plan.  Disposition plan Home with hospice services   Level of care: Med-Surg  Consultants:  None  Procedures:  None  Antimicrobials: None    Subjective: Patient seen and examined.  Sitting up in chair with assistance of patient care tech.  No visible distress.  Answers questions appropriately.  No  Objective: Vitals:   06/17/22 0438 06/17/22 0816 06/17/22 0820 06/17/22 0826  BP: 127/81 90/61 (!) 81/53 93/63  Pulse: 73  73 75  Resp: 16  18   Temp: 98.1 F (36.7 C)  97.7 F (36.5 C)   TempSrc:      SpO2: 99%  96% 98%  Weight:      Height:        Intake/Output Summary (Last 24 hours) at 06/17/2022 1401 Last data filed at 06/16/2022 1514 Gross per 24 hour  Intake --  Output 500 ml  Net -500 ml   Filed Weights   05/20/22 1527 05/21/22 0631 06/03/22 0300  Weight: 79 kg 76.7 kg 75.3 kg    Examination:  General exam: Acute distress.  Sitting up in chair Respiratory system: Clear to auscultation. Respiratory effort normal. Cardiovascular system: S1-S2, RRR, no murmurs, no pedal edema Gastrointestinal system: Soft, thin, NT/ND, normal bowel sounds Central nervous system: Alert, oriented, resting tremor, flattened affect Extremities: Power decreased, bilaterally Skin: No rashes, lesions or ulcers Psychiatry:  Judgement and insight appear normal. Mood & affect flattened.     Data Reviewed: I have personally reviewed following labs and imaging studies  CBC: Recent Labs  Lab 06/12/22 0742  WBC 7.9  NEUTROABS 6.3  HGB 13.0  HCT 40.2  MCV 92.4  PLT 244   Basic Metabolic Panel: Recent Labs  Lab 06/12/22 0742  NA 135  K 3.9  CL 101  CO2 30  GLUCOSE 108*  BUN 11  CREATININE 0.85  CALCIUM 8.7*   GFR: Estimated Creatinine Clearance: 70.9 mL/min (by C-G formula based on SCr of 0.85 mg/dL). Liver Function Tests: No results for input(s): "AST", "ALT", "ALKPHOS", "BILITOT", "PROT", "ALBUMIN" in the last 168 hours.  No results for input(s): "LIPASE", "AMYLASE" in the last 168 hours. No results for input(s): "AMMONIA" in the last 168 hours. Coagulation Profile: No results for input(s): "INR", "PROTIME" in the last 168 hours. Cardiac Enzymes: No results for input(s): "CKTOTAL", "CKMB", "CKMBINDEX", "TROPONINI" in the last 168 hours. BNP (last 3 results) No results for input(s): "PROBNP" in the last 8760 hours. HbA1C: No results for input(s): "HGBA1C" in  the last 72 hours. CBG: Recent Labs  Lab 06/16/22 1658 06/16/22 1950 06/16/22 2209 06/17/22 0815 06/17/22 1134  GLUCAP 133* 150* 125* 174* 142*   Lipid Profile: No results for input(s): "CHOL", "HDL", "LDLCALC", "TRIG", "CHOLHDL", "LDLDIRECT" in the last 72 hours. Thyroid Function Tests: No results for input(s): "TSH", "T4TOTAL", "FREET4", "T3FREE", "THYROIDAB" in the last 72 hours. Anemia Panel: No results for input(s): "VITAMINB12", "FOLATE", "FERRITIN", "TIBC", "IRON", "RETICCTPCT" in the last 72 hours. Sepsis Labs: No results for input(s): "PROCALCITON", "LATICACIDVEN" in the last 168 hours.  No results found for this or any previous visit (from the past 240 hour(s)).       Radiology Studies: No results found.      Scheduled Meds:  carbidopa-levodopa  2 tablet Oral 5 X Daily   enoxaparin (LOVENOX) injection   40 mg Subcutaneous Q24H   feeding supplement  237 mL Oral TID BM   gabapentin  300 mg Oral TID   insulin aspart  0-9 Units Subcutaneous TID AC & HS   insulin glargine-yfgn  12 Units Subcutaneous QHS   linagliptin  5 mg Oral Daily   multivitamin with minerals  1 tablet Oral Daily   QUEtiapine  75 mg Oral QHS   rivastigmine  1.5 mg Oral BID   rOPINIRole  2 mg Oral 5 X Daily   sodium chloride flush  3 mL Intravenous Q12H   Continuous Infusions:   LOS: 31 days   Sidney Ace, MD [facility]   If 7PM-7AM, please contact night-coverage  06/17/2022, 2:01 PM

## 2022-06-17 NOTE — Progress Notes (Signed)
2:30pm EMS arranged to pick patient and transport to his home address.

## 2022-06-17 NOTE — Progress Notes (Signed)
Physical Therapy Treatment Patient Details Name: Randall [name] MRN: [id number] DOB: 25-Jun-1951 Today's Date: 06/17/2022   History of Present Illness Pt is a 71 year old male admitted after being found down on floor for 12 hours, admitted with significant rhabdomyolysis; PMH significant for  hypertension, hyperlipidemia, diabetes mellitus type 2, Parkinson's disease with dementia. Pt required haldol on 05/19/21 2/2 agitation/sundowning.   PT Comments    Patient was agreeable to PT session. Supportive spouse at the bedside initially. Over time, the patient has varied significantly on the amount of physical assistance required to mobilize. Today, the patient required moderate assistance for bed mobility, multiple sit to stand transfers, and short distance ambulation with the rolling walker. Standing balance is poor with R lean noted with dynamic activity. He is still requiring physical assistance to mobilize. SNF is recommended at discharge. Recommend to continue PT to maximize independence and decrease caregiver burden.    Recommendations for follow up therapy are one component of a multi-disciplinary discharge planning process, led by the attending physician.  Recommendations may be updated based on patient status, additional functional criteria and insurance authorization.  Follow Up Recommendations  Skilled nursing-short term rehab (<3 hours/day) Can patient physically be transported by private vehicle: No   Assistance Recommended at Discharge Frequent or constant Supervision/Assistance  Patient can return home with the following A lot of help with walking and/or transfers;A lot of help with bathing/dressing/bathroom;Assistance with cooking/housework;Direct supervision/assist for medications management;Direct supervision/assist for financial management;Assist for transportation;Help with stairs or ramp for entrance   Equipment Recommendations  Rolling walker (2 wheels);BSC/3in1;Wheelchair  (measurements PT);Wheelchair cushion (measurements PT)    Recommendations for Other Services       Precautions / Restrictions Precautions Precautions: Fall Restrictions Weight Bearing Restrictions: No     Mobility  Bed Mobility Overal bed mobility: Needs Assistance Bed Mobility: Supine to Sit     Supine to sit: Mod assist     General bed mobility comments: assistance for trunk support. increased time and effort required    Transfers Overall transfer level: Needs assistance Equipment used: Rolling walker (2 wheels) Transfers: Sit to/from Stand Sit to Stand: Mod assist           General transfer comment: verbal cues for technique. assistance for lifting and lowering. multiple standing bouts performed, bed bed and from recliner chair    Ambulation/Gait Ambulation/Gait assistance: Mod assist Gait Distance (Feet): 10 Feet Assistive device: Rolling walker (2 wheels) Gait Pattern/deviations: Shuffle, Narrow base of support Gait velocity: decreased     General Gait Details: inconsistent step length and increased step height. R lean with faciliation provided for left weight shifting. activity tolerance limited by fatigue   Stairs             Wheelchair Mobility    Modified Rankin (Stroke Patients Only)       Balance Overall balance assessment: Needs assistance Sitting-balance support: Feet supported, No upper extremity supported Sitting balance-Leahy Scale: Poor Sitting balance - Comments: poor initially progressing to fair with increased sitting time Postural control: Right lateral lean Standing balance support: Bilateral upper extremity supported Standing balance-Leahy Scale: Poor Standing balance comment: external support required to maintain standing balance                            Cognition Arousal/Alertness: Awake/alert Behavior During Therapy: WFL for tasks assessed/performed Overall Cognitive Status: History of cognitive  impairments - at baseline  General Comments: patient able to follow single step commands with increased time        Exercises      General Comments        Pertinent Vitals/Pain Pain Assessment Pain Assessment: No/denies pain    Home Living                          Prior Function            PT Goals (current goals can now be found in the care plan section) Acute Rehab PT Goals Patient Stated Goal: to get better PT Goal Formulation: With patient Time For Goal Achievement: 07/01/22 Potential to Achieve Goals: Fair Progress towards PT goals: Progressing toward goals    Frequency    Min 2X/week      PT Plan Current plan remains appropriate (care plan will be extended x 2 weeks, goals remain appropriate)    Co-evaluation              AM-PAC PT "6 Clicks" Mobility   Outcome Measure  Help needed turning from your back to your side while in a flat bed without using bedrails?: A Lot Help needed moving from lying on your back to sitting on the side of a flat bed without using bedrails?: A Lot Help needed moving to and from a bed to a chair (including a wheelchair)?: A Lot Help needed standing up from a chair using your arms (e.g., wheelchair or bedside chair)?: A Lot Help needed to walk in hospital room?: A Lot Help needed climbing 3-5 steps with a railing? : Total 6 Click Score: 11    End of Session         PT Visit Diagnosis: Unsteadiness on feet (R26.81);History of falling (Z91.81);Muscle weakness (generalized) (M62.81);Other abnormalities of gait and mobility (R26.89)     Time: 3790-2409 PT Time Calculation (min) (ACUTE ONLY): 23 min  Charges:  $Gait Training: 8-22 mins $Therapeutic Activity: 8-22 mins                    Minna Merritts, PT, MPT    Percell Locus 06/17/2022, 12:18 PM

## 2022-06-17 NOTE — Progress Notes (Signed)
[facility])    MSW contacted spouse/[name] to confirm that she is prepared for DC today w/ [facility] services. [name] reports that she is not prepared for patient to return home as she wants patient to improve/have additional support in the home.   [place] IDT aware and will continue DC planning with family. [facility] will continue to follow through hospitalization.   Please call with any questions/concerns.    Thank you for the opportunity to participate in this patient's care   Daphene Calamity, MSW [facility] Liaison  [phone]

## 2022-06-18 DIAGNOSIS — R627 Adult failure to thrive: Secondary | ICD-10-CM | POA: Diagnosis not present

## 2022-06-18 LAB — GLUCOSE, CAPILLARY
Glucose-Capillary: 121 mg/dL — ABNORMAL HIGH (ref 70–99)
Glucose-Capillary: 155 mg/dL — ABNORMAL HIGH (ref 70–99)
Glucose-Capillary: 116 mg/dL — ABNORMAL HIGH (ref 70–99)
Glucose-Capillary: 161 mg/dL — ABNORMAL HIGH (ref 70–99)

## 2022-06-18 MED ORDER — CARBIDOPA-LEVODOPA ER 50-200 MG PO TBCR: 1.0000 | EXTENDED_RELEASE_TABLET | Freq: Every day | ORAL | Status: DC

## 2022-06-18 MED ORDER — CARBIDOPA-LEVODOPA 25-250 MG PO TABS: 1.0000 | ORAL_TABLET | Freq: Four times a day (QID) | ORAL | Status: DC

## 2022-06-18 NOTE — Progress Notes (Signed)
Pt found on floor with response to bed alarm. Pt assessment reveals small abrasion on right elbow, no other changes. Pt denies any pain or injury. MD notified. No new orders at this time. Attempted to notify spouse. Left vioce mail with RN name and unit phone number for call back.   06/18/22 1438  What Happened  Was fall witnessed? No  Was patient injured? Unsure  Patient found on floor  Found by Staff-comment  Stated prior activity ambulating-unassisted  Follow Up  MD notified Ralene Muskrat, MD  Time MD notified [phone]  Family notified Yes - comment (attempted to call voicemail left to call Regino Schultze at [phone])  Time family notified 1521  Additional tests No  Adult Fall Risk Assessment  Risk Factor Category (scoring not indicated) Fall has occurred during this admission (document High fall risk)  Patient Fall Risk Level High fall risk  Adult Fall Risk Interventions  Required Bundle Interventions *See Row Information* High fall risk - low, moderate, and high requirements implemented  Additional Interventions Room near nurses station;Safety Sitter/Safety Rounder  Screening for Fall Injury Risk (To be completed on HIGH fall risk patients) - Assessing Need for Floor Mats  Risk For Fall Injury- Criteria for Floor Mats Previous fall this admission;Confusion/dementia (+NuDESC, CIWA, TBI, etc.)  Will Implement Floor Mats Yes  Vitals  Temp 97.6 F (36.4 C)  BP 112/73  MAP (mmHg) 85  BP Location Right Arm  BP Method Automatic  Patient Position (if appropriate) Lying  Pulse Rate 69  Pulse Rate Source Dinamap  Resp 18  Oxygen Therapy  SpO2 100 %  O2 Device Room Air  Pain Assessment  Pain Scale 0-10  Pain Score 0  PCA/Epidural/Spinal Assessment  Respiratory Pattern Regular;Unlabored  Neurological  Level of Consciousness Alert  Orientation Level Oriented to person  Cognition Memory impairment;Poor attention/concentration;Follows commands  Speech Slurred/Dysarthria   Neuro Symptoms Tremors;Anxiety  Neuro symptoms relieved by Anti-anxiety medication  Musculoskeletal  Musculoskeletal (WDL) X  Assistive Device Front wheel walker  Generalized Weakness Yes  Musculoskeletal Details  RUE Weakness;Other (Comment) (tremors)  LUE Weakness;Other (Comment) (tremmors)  RLE Weakness  LLE Weakness  Integumentary  Integumentary (WDL) X  Abrasion Location Elbow  Abrasion Location Orientation Right

## 2022-06-18 NOTE — Progress Notes (Signed)
PROGRESS NOTE    PRESLEY SUMMERLIN  AOZ:[id number] DOB: 1951/02/06 DOA: 05/17/2022 PCP: Adin Hector, MD    Brief Narrative:  Randall [name] is a 71 y.o. male with medical history significant of hypertension, hyperlipidemia, diabetes mellitus type 2, Parkinson's disease with dementia presents after having a fall at home.  Patient was on the floor for about 12 hours.  By time he arrived in the hospital, he was found to have significant rhabdomyolysis with CK level of 8278.  Renal function still normal.  He was placed on IV fluids. Patient CK level was normalized on 9/12.  However, patient developed some confusion as well as dysphagia.  PT/OT recommended nursing home placement.     On 05/24/2022 urine very dark.  Patient given a fluid bolus and urine cleared up a little bit.  No blood in the urine.   On 05/25/2022.  Patient was having facial tremor and difficulty eating and taking meds.   05/26/2022 through 05/28/2022.  The patient has not been eating very much.  His urine is dark.  Bilirubin slightly high.  Previous urine analysis did not show any blood in the urine.  The patient wants to get out of the hospital.  On 05/28/2022 and his wife chose a bed at [facility] and insurance authorization is undergoing.   9/20: Received notification from [facility] that patient's insurance carrier has declined skilled nursing facility authorization.  Peer to peer with medical director at [facility] (Dr. Coralie Carpen at [phone]) was done by Dr. Priscella Mann.  Denial was upheld.   9/22: Denial upheld through [facility].  TOC engaged and attempting to assist with assisted living facility placement.   9/27: Insurance authorization for SNF denied again per Donny Pique, Tourist information centre manager. 10/6: Patient may be eligible for hospice services at home.  Hospice liaison Santiago Glad engaged   10/7: Discussed with wife, hospice liaison, TOC.  Patient is now accepted to home with hospice services.  Wife is in agreement.  Plan on discharge Monday 10/9    10/9: Hospice liaison spoke to patient's wife who now is saying that she is unable to handle patient at home even with the assistance of hospice services.  We are lacking disposition plan.  Senior Case management involved to assist in disposition planning  10/10: We have exhausted all other discharge options.  At this point the only possible disposition is and to patient home with hospice services.  The wife is in agreement though she is expressed many concerns about her ability to care for patient at home.  I have explained to her at length that we do not have another possible disposition.  Plan discharge home with hospice services Wednesday 10/11.  EMS pickup scheduled for 9:30 AM.  Hospice at home admission to start 10 AM   Assessment & Plan:   Principal Problem:   Failure to thrive in adult Active Problems:   Rhabdomyolysis   Acute delirium   Parkinson's disease   Fall at home, initial encounter   Leukocytosis   Hyponatremia   Type 2 diabetes mellitus with hyperlipidemia (HCC)   Transaminitis   Hyperbilirubinemia   Dysphagia   Dementia with behavioral disturbance (HCC)   Constipation   Weakness   Hypokalemia  Failure to thrive in adult with mechanical fall, generalized weakness Reportedly fell on 06/02/2022 but without any injuries. Continue fall precautions.  Continue PT and OT while inpatient.  PT recommended discharge to SNF but authorization to SNF has been denied twice.  Appeal has been  submitted on 10/3 and is currently pending.  Patient is home with hospice appropriate.  Hospice liaison engaged.   Plan: Hospice liaison I spoken to the wife multiple times.  Patient's wife expresses concerns about her ability to care for patient at home.  At this point patient is remained hospitalized for 31 days and it is unlikely that continued hospitalization will be of benefit to him.  He has been denied from skilled nursing facility twice.  We do not have any other possible disposition.   He has been accepted to hospice at home.  Services to start Wednesday at 10 AM.  We will plan to discharge Wednesday a.m., EMS scheduled for pickup 9:30 AM   Acute delirium Waxing and waning mental state.  Has been improving over the past couple days.  Continue Seroquel qHS for now.  Can stop this medication on DC   Parkinson's disease ([place]) Continue ropinirole and Sinemet   Type 2 diabetes mellitus with hyperglycemia Continue Semglee 12 units nightly.  NovoLog as needed for hyperglycemia.  Last hemoglobin A1c 5.5 on 06/04/2022.   Elevated liver enzymes Improved.  AST, ALT and ALP are normal.  Bilirubin is almost normal at 1.4   Rhabdomyolysis, hypokalemia, hyponatremia Resolved   Constipation Laxatives as needed   Dementia with behavioral disturbance (HCC) Continue psychotropics   DVT prophylaxis: SQ Lovenox Code Status: DNR Family Communication:Spouse [name] [phone] on 10/4, at bedside 10/5, via phone 10/6, bedside 10/7, left VM on 10/10 Disposition Plan: Status is: Inpatient Remains inpatient appropriate because: Unsafe discharge plan.  Plan to discharge home with hospice services for Wednesday 10/11 9:30 AM   Level of care: Med-Surg  Consultants:  None  Procedures:  None Antimicrobials: None   Subjective: Seen and examined.  Sitting up in chair.  At baseline level of functioning mental status. Objective: Vitals:   06/17/22 0826 06/17/22 1633 06/17/22 2041 06/18/22 0544  BP: 93/63 101/66 126/73 (!) 149/85  Pulse: 75 82 (!) 59 77  Resp:  '16 18 17  '$ Temp:  (!) 97.5 F (36.4 C) 97.6 F (36.4 C) 98.3 F (36.8 C)  TempSrc:      SpO2: 98% 98% 100% 100%  Weight:      Height:        Intake/Output Summary (Last 24 hours) at 06/18/2022 1129 Last data filed at 06/17/2022 2123 Gross per 24 hour  Intake --  Output 1050 ml  Net -1050 ml   Filed Weights   05/20/22 1527 05/21/22 0631 06/03/22 0300  Weight: 79 kg 76.7 kg 75.3 kg     Examination:  General exam: No acute distress Respiratory system: Lungs clear.  Normal work of breathing.  Room air Cardiovascular system: S1-S2, RRR, no murmurs, no pedal edema Gastrointestinal system:, NT/ND, normal bowel sounds Central nervous system: Alert and oriented.  Resting tremor.  No focal deficits.  Dysarthric speech Extremities: Symmetric 5 x 5 power. Skin: No rashes, lesions or ulcers Psychiatry: Judgement and insight appear normal. Mood & affect flattened.     Data Reviewed: I have personally reviewed following labs and imaging studies  CBC: Recent Labs  Lab 06/12/22 0742  WBC 7.9  NEUTROABS 6.3  HGB 13.0  HCT 40.2  MCV 92.4  PLT 097   Basic Metabolic Panel: Recent Labs  Lab 06/12/22 0742  NA 135  K 3.9  CL 101  CO2 30  GLUCOSE 108*  BUN 11  CREATININE 0.85  CALCIUM 8.7*   GFR: Estimated Creatinine Clearance: 70.9 mL/min (by C-G  formula based on SCr of 0.85 mg/dL). Liver Function Tests: No results for input(s): "AST", "ALT", "ALKPHOS", "BILITOT", "PROT", "ALBUMIN" in the last 168 hours. No results for input(s): "LIPASE", "AMYLASE" in the last 168 hours. No results for input(s): "AMMONIA" in the last 168 hours. Coagulation Profile: No results for input(s): "INR", "PROTIME" in the last 168 hours. Cardiac Enzymes: No results for input(s): "CKTOTAL", "CKMB", "CKMBINDEX", "TROPONINI" in the last 168 hours. BNP (last 3 results) No results for input(s): "PROBNP" in the last 8760 hours. HbA1C: No results for input(s): "HGBA1C" in the last 72 hours. CBG: Recent Labs  Lab 06/17/22 1134 06/17/22 1639 06/17/22 2043 06/18/22 0817 06/18/22 1125  GLUCAP 142* 197* 133* 121* 155*   Lipid Profile: No results for input(s): "CHOL", "HDL", "LDLCALC", "TRIG", "CHOLHDL", "LDLDIRECT" in the last 72 hours. Thyroid Function Tests: No results for input(s): "TSH", "T4TOTAL", "FREET4", "T3FREE", "THYROIDAB" in the last 72 hours. Anemia Panel: No results for  input(s): "VITAMINB12", "FOLATE", "FERRITIN", "TIBC", "IRON", "RETICCTPCT" in the last 72 hours. Sepsis Labs: No results for input(s): "PROCALCITON", "LATICACIDVEN" in the last 168 hours.  No results found for this or any previous visit (from the past 240 hour(s)).       Radiology Studies: No results found.      Scheduled Meds:  carbidopa-levodopa  2 tablet Oral 5 X Daily   enoxaparin (LOVENOX) injection  40 mg Subcutaneous Q24H   feeding supplement  237 mL Oral TID BM   gabapentin  300 mg Oral TID   insulin aspart  0-9 Units Subcutaneous TID AC & HS   insulin glargine-yfgn  12 Units Subcutaneous QHS   linagliptin  5 mg Oral Daily   multivitamin with minerals  1 tablet Oral Daily   QUEtiapine  75 mg Oral QHS   rivastigmine  1.5 mg Oral BID   rOPINIRole  2 mg Oral 5 X Daily   sodium chloride flush  3 mL Intravenous Q12H   Continuous Infusions:   LOS: 32 days      Sidney Ace, MD [facility]   If 7PM-7AM, please contact night-coverage  06/18/2022, 11:29 AM

## 2022-06-18 NOTE — Evaluation (Signed)
Occupational Therapy RE-Evaluation Patient Details Name: JAEDEN MESSER MRN: [id number] DOB: 06/17/1951 Today's Date: 06/18/2022   History of Present Illness Pt is a 71 year old male admitted after being found down on floor for 12 hours, admitted with significant rhabdomyolysis; PMH significant for  hypertension, hyperlipidemia, diabetes mellitus type 2, Parkinson's disease with dementia. Pt required haldol on 05/19/21 2/2 agitation/sundowning.   Clinical Impression   Upon entering the room, pt seated in recliner chair with no c/o pain and reports being done with breakfast. Pt does appear to have food on face and agreeable to work with therapy this session. Pt standing with mod assist secondary to posterior bias. Pt ambulates 10' in room with RW and min A for safety to sink. Pt appears to be holding food in mouth and spits it into sink. Set up A to obtain tooth brush and put on tooth paste. Pt standing for ~ 5 minutes to brush teeth with cuing for upright posture. Pt taking several steps backwards to return to bed. Sit >supine with mod A and pt appears comfortable in bed. OT leaving beach music playing in room and pt singing while therapist exits the room. All needs within reach.      Recommendations for follow up therapy are one component of a multi-disciplinary discharge planning process, led by the attending physician.  Recommendations may be updated based on patient status, additional functional criteria and insurance authorization.   Follow Up Recommendations  Skilled nursing-short term rehab (<3 hours/day)    Assistance Recommended at Discharge Frequent or constant Supervision/Assistance  Patient can return home with the following Assistance with cooking/housework;Direct supervision/assist for medications management;A lot of help with walking and/or transfers;A lot of help with bathing/dressing/bathroom;Direct supervision/assist for financial management;Assist for transportation;Help with stairs  or ramp for entrance       Equipment Recommendations  BSC/3in1       Precautions / Restrictions Precautions Precautions: Fall Precaution Comments: Aspiration      Mobility Bed Mobility Overal bed mobility: Needs Assistance Bed Mobility: Sit to Supine       Sit to supine: Mod assist   General bed mobility comments: assistance with trunk and B LEs    Transfers Overall transfer level: Needs assistance Equipment used: Rolling walker (2 wheels) Transfers: Sit to/from Stand Sit to Stand: Mod assist           General transfer comment: mod lifting assistance secondary to posterior bias in standing      Balance Overall balance assessment: Needs assistance Sitting-balance support: Feet supported, No upper extremity supported Sitting balance-Leahy Scale: Fair   Postural control: Right lateral lean Standing balance support: Bilateral upper extremity supported, Reliant on assistive device for balance, During functional activity Standing balance-Leahy Scale: Poor Standing balance comment: external support required to maintain standing balance                           ADL either performed or assessed with clinical judgement   ADL Overall ADL's : Needs assistance/impaired     Grooming: Wash/dry face;Oral care;Standing;Min guard;Set up Grooming Details (indicate cue type and reason): Completed while standing at the sink with Min guard for balance                             Functional mobility during ADLs: Minimal assistance;Rolling walker (2 wheels)       Vision Baseline Vision/History: 1 Wears glasses Patient  Visual Report: No change from baseline              Pertinent Vitals/Pain Pain Assessment Pain Assessment: Faces Faces Pain Scale: No hurt        Extremity/Trunk Assessment Upper Extremity Assessment Upper Extremity Assessment: Generalized weakness   Lower Extremity Assessment Lower Extremity Assessment: Generalized  weakness   Cervical / Trunk Assessment Cervical / Trunk Assessment: Kyphotic   Communication     Cognition Arousal/Alertness: Awake/alert Behavior During Therapy: WFL for tasks assessed/performed Overall Cognitive Status: History of cognitive impairments - at baseline                                 General Comments: patient able to follow single step commands with increased time. Difficult to understand at times but is pleasant and cooperative.                                    OT Goals(Current goals can be found in the care plan section) Acute Rehab OT Goals Patient Stated Goal: to go home OT Goal Formulation: With patient Time For Goal Achievement: 07/16/22 Potential to Achieve Goals: Good  OT Frequency: Min 2X/week       AM-PAC OT "6 Clicks" Daily Activity     Outcome Measure Help from another person eating meals?: A Little Help from another person taking care of personal grooming?: A Little Help from another person toileting, which includes using toliet, bedpan, or urinal?: A Lot Help from another person bathing (including washing, rinsing, drying)?: A Lot Help from another person to put on and taking off regular upper body clothing?: A Little Help from another person to put on and taking off regular lower body clothing?: A Lot 6 Click Score: 15   End of Session Equipment Utilized During Treatment: Rolling walker (2 wheels) Nurse Communication: Mobility status  Activity Tolerance: Patient tolerated treatment well Patient left: in bed;with call bell/phone within reach;with bed alarm set  OT Visit Diagnosis: Unsteadiness on feet (R26.81);Other abnormalities of gait and mobility (R26.89);History of falling (Z91.81)                Time: 6433-2951 OT Time Calculation (min): 16 min Charges:  OT General Charges $OT Visit: 1 Visit OT Evaluation $OT Re-eval: 1 Re-eval OT Treatments $Self Care/Home Management : 8-22 mins Darleen Crocker, MS,  OTR/L , CBIS ascom [phone]  06/18/22, 11:17 AM

## 2022-06-18 NOTE — TOC Progression Note (Addendum)
Transition of Care [facility]) - Progression Note    Patient Details  Name: Randall [name] MRN: [id number] Date of Birth: 08-27-1951  Transition of Care [facility]) CM/SW Contact  Laurena Slimmer, RN Phone Number: 06/18/2022, 11:01 AM  Clinical Narrative:    Spoke with Mrs. Kakos regarding discharge today. She is not agreeable to patient being discharge home today. She stated she has an appointment. She is agreeable for patient to discharge home tomorrow. She was reminded of hospice appointment at her home at 10am tomorrow.    EMS rescheduled from today at noon to 06/19/2022 at 9:30am  3:57pm Face sheet and medical necessity sheet printed to floor to be added to patient EMS packet    Expected Discharge Plan: [place] Barriers to Discharge: Continued Medical Work up  Expected Discharge Plan and Services Expected Discharge Plan: [place] Choice: [place] arrangements for the past 2 months: Single Family Home                                       Social Determinants of Health (SDOH) Interventions Utilities Interventions: Intervention Not Indicated  Readmission Risk Interventions     No data to display

## 2022-06-18 NOTE — Discharge Summary (Deleted)
PROGRESS NOTE    [name]  UVO:[id number] DOB: 17-Nov-1950 DOA: 05/17/2022 PCP: Adin Hector, MD    Brief Narrative:  Randall [name] is a 71 y.o. male with medical history significant of hypertension, hyperlipidemia, diabetes mellitus type 2, Parkinson's disease with dementia presents after having a fall at home.  Patient was on the floor for about 12 hours.  By time he arrived in the hospital, he was found to have significant rhabdomyolysis with CK level of 8278.  Renal function still normal.  He was placed on IV fluids. Patient CK level was normalized on 9/12.  However, patient developed some confusion as well as dysphagia.  PT/OT recommended nursing home placement.     On 05/24/2022 urine very dark.  Patient given a fluid bolus and urine cleared up a little bit.  No blood in the urine.   On 05/25/2022.  Patient was having facial tremor and difficulty eating and taking meds.   05/26/2022 through 05/28/2022.  The patient has not been eating very much.  His urine is dark.  Bilirubin slightly high.  Previous urine analysis did not show any blood in the urine.  The patient wants to get out of the hospital.  On 05/28/2022 and his wife chose a bed at [facility] and insurance authorization is undergoing.   9/20: Received notification from [facility] that patient's insurance carrier has declined skilled nursing facility authorization.  Peer to peer with medical director at [facility] (Dr. Coralie Carpen at [phone]) was done by Dr. Priscella Mann.  Denial was upheld.   9/22: Denial upheld through [place].  TOC engaged and attempting to assist with assisted living facility placement.   9/27: Insurance authorization for SNF denied again per Donny Pique, Tourist information centre manager. 10/6: Patient may be eligible for hospice services at home.  Hospice liaison Santiago Glad engaged   10/7: Discussed with wife, hospice liaison, TOC.  Patient is now accepted to home with hospice services.  Wife is in agreement.  Plan on discharge Monday 10/9    10/9: Hospice liaison spoke to patient's wife who now is saying that she is unable to handle patient at home even with the assistance of hospice services.  We are lacking disposition plan.  Senior Case management involved to assist in disposition planning  10/10: We have exhausted all other discharge options.  At this point the only possible disposition is and to patient home with hospice services.  The wife is in agreement though she is expressed many concerns about her ability to care for patient at home.  I have explained to her at length that we do not have another possible disposition.  Plan discharge home with hospice services Wednesday 10/11.  EMS pickup scheduled for 9:30 AM.  Hospice at home admission to start 10 AM   Assessment & Plan:   Principal Problem:   Failure to thrive in adult Active Problems:   Rhabdomyolysis   Acute delirium   Parkinson's disease   Fall at home, initial encounter   Leukocytosis   Hyponatremia   Type 2 diabetes mellitus with hyperlipidemia (HCC)   Transaminitis   Hyperbilirubinemia   Dysphagia   Dementia with behavioral disturbance (HCC)   Constipation   Weakness   Hypokalemia  Failure to thrive in adult with mechanical fall, generalized weakness Reportedly fell on 06/02/2022 but without any injuries. Continue fall precautions.  Continue PT and OT while inpatient.  PT recommended discharge to SNF but authorization to SNF has been denied twice.  Appeal has been  submitted on 10/3 and is currently pending.  Patient is home with hospice appropriate.  Hospice liaison engaged.   Plan: Hospice liaison I spoken to the wife multiple times.  Patient's wife expresses concerns about her ability to care for patient at home.  At this point patient is remained hospitalized for 31 days and it is unlikely that continued hospitalization will be of benefit to him.  He has been denied from skilled nursing facility twice.  We do not have any other possible disposition.   He has been accepted to hospice at home.  Services to start Wednesday at 10 AM.  We will plan to discharge Wednesday a.m., EMS scheduled for pickup 9:30 AM   Acute delirium Waxing and waning mental state.  Has been improving over the past couple days.  Continue Seroquel qHS for now.  Can stop this medication on DC   Parkinson's disease ([place]) Continue ropinirole and Sinemet   Type 2 diabetes mellitus with hyperglycemia Continue Semglee 12 units nightly.  NovoLog as needed for hyperglycemia.  Last hemoglobin A1c 5.5 on 06/04/2022.   Elevated liver enzymes Improved.  AST, ALT and ALP are normal.  Bilirubin is almost normal at 1.4   Rhabdomyolysis, hypokalemia, hyponatremia Resolved   Constipation Laxatives as needed   Dementia with behavioral disturbance (HCC) Continue psychotropics   DVT prophylaxis: SQ Lovenox Code Status: DNR Family Communication:Spouse Trenton Passow [phone] on 10/4, at bedside 10/5, via phone 10/6, bedside 10/7, left VM on 10/10 Disposition Plan: Status is: Inpatient Remains inpatient appropriate because: Unsafe discharge plan.  Plan to discharge home with hospice services for Wednesday 10/11 9:30 AM   Level of care: Med-Surg  Consultants:  None  Procedures:  None Antimicrobials: None   Subjective: Seen and examined.  Sitting up in chair.  At baseline level of functioning mental status. Objective: Vitals:   06/17/22 0826 06/17/22 1633 06/17/22 2041 06/18/22 0544  BP: 93/63 101/66 126/73 (!) 149/85  Pulse: 75 82 (!) 59 77  Resp:  '16 18 17  '$ Temp:  (!) 97.5 F (36.4 C) 97.6 F (36.4 C) 98.3 F (36.8 C)  TempSrc:      SpO2: 98% 98% 100% 100%  Weight:      Height:        Intake/Output Summary (Last 24 hours) at 06/18/2022 1107 Last data filed at 06/17/2022 2123 Gross per 24 hour  Intake --  Output 1050 ml  Net -1050 ml   Filed Weights   05/20/22 1527 05/21/22 0631 06/03/22 0300  Weight: 79 kg 76.7 kg 75.3 kg     Examination:  General exam: No acute distress Respiratory system: Lungs clear.  Normal work of breathing.  Room air Cardiovascular system: S1-S2, RRR, no murmurs, no pedal edema Gastrointestinal system:, NT/ND, normal bowel sounds Central nervous system: Alert and oriented.  Resting tremor.  No focal deficits.  Dysarthric speech Extremities: Symmetric 5 x 5 power. Skin: No rashes, lesions or ulcers Psychiatry: Judgement and insight appear normal. Mood & affect flattened.     Data Reviewed: I have personally reviewed following labs and imaging studies  CBC: Recent Labs  Lab 06/12/22 0742  WBC 7.9  NEUTROABS 6.3  HGB 13.0  HCT 40.2  MCV 92.4  PLT 053   Basic Metabolic Panel: Recent Labs  Lab 06/12/22 0742  NA 135  K 3.9  CL 101  CO2 30  GLUCOSE 108*  BUN 11  CREATININE 0.85  CALCIUM 8.7*   GFR: Estimated Creatinine Clearance: 70.9 mL/min (by C-G  formula based on SCr of 0.85 mg/dL). Liver Function Tests: No results for input(s): "AST", "ALT", "ALKPHOS", "BILITOT", "PROT", "ALBUMIN" in the last 168 hours. No results for input(s): "LIPASE", "AMYLASE" in the last 168 hours. No results for input(s): "AMMONIA" in the last 168 hours. Coagulation Profile: No results for input(s): "INR", "PROTIME" in the last 168 hours. Cardiac Enzymes: No results for input(s): "CKTOTAL", "CKMB", "CKMBINDEX", "TROPONINI" in the last 168 hours. BNP (last 3 results) No results for input(s): "PROBNP" in the last 8760 hours. HbA1C: No results for input(s): "HGBA1C" in the last 72 hours. CBG: Recent Labs  Lab 06/17/22 0815 06/17/22 1134 06/17/22 1639 06/17/22 2043 06/18/22 0817  GLUCAP 174* 142* 197* 133* 121*   Lipid Profile: No results for input(s): "CHOL", "HDL", "LDLCALC", "TRIG", "CHOLHDL", "LDLDIRECT" in the last 72 hours. Thyroid Function Tests: No results for input(s): "TSH", "T4TOTAL", "FREET4", "T3FREE", "THYROIDAB" in the last 72 hours. Anemia Panel: No results for  input(s): "VITAMINB12", "FOLATE", "FERRITIN", "TIBC", "IRON", "RETICCTPCT" in the last 72 hours. Sepsis Labs: No results for input(s): "PROCALCITON", "LATICACIDVEN" in the last 168 hours.  No results found for this or any previous visit (from the past 240 hour(s)).       Radiology Studies: No results found.      Scheduled Meds:  carbidopa-levodopa  2 tablet Oral 5 X Daily   enoxaparin (LOVENOX) injection  40 mg Subcutaneous Q24H   feeding supplement  237 mL Oral TID BM   gabapentin  300 mg Oral TID   insulin aspart  0-9 Units Subcutaneous TID AC & HS   insulin glargine-yfgn  12 Units Subcutaneous QHS   linagliptin  5 mg Oral Daily   multivitamin with minerals  1 tablet Oral Daily   QUEtiapine  75 mg Oral QHS   rivastigmine  1.5 mg Oral BID   rOPINIRole  2 mg Oral 5 X Daily   sodium chloride flush  3 mL Intravenous Q12H   Continuous Infusions:   LOS: 32 days      Sidney Ace, MD [facility]   If 7PM-7AM, please contact night-coverage  06/18/2022, 11:07 AM

## 2022-06-18 NOTE — Progress Notes (Signed)
[facility])    Plan is for patient to D/C via [facility] @ 9:30 am on 10/11 per TOC/[place]. Patient is scheduled for admission with [facility] services on 10.11 @ 10 a.m. [facility] staff have been notified and are prepared for patient D/C.  If applicable, please send signed and completed DNR with patient/family upon discharge. Please provide prescriptions at discharge as needed to ensure ongoing symptom management and a transport packet.   Please call with any questions/concerns.    Thank you for the opportunity to participate in this patient's care   Daphene Calamity, MSW [facility] Liaison  [phone]

## 2022-06-19 DIAGNOSIS — F03918 Unspecified dementia, unspecified severity, with other behavioral disturbance: Secondary | ICD-10-CM | POA: Diagnosis not present

## 2022-06-19 DIAGNOSIS — R627 Adult failure to thrive: Secondary | ICD-10-CM | POA: Diagnosis not present

## 2022-06-19 DIAGNOSIS — R410 Disorientation, unspecified: Secondary | ICD-10-CM | POA: Diagnosis not present

## 2022-06-19 DIAGNOSIS — W19XXXA Unspecified fall, initial encounter: Secondary | ICD-10-CM | POA: Diagnosis not present

## 2022-06-19 LAB — GLUCOSE, CAPILLARY: Glucose-Capillary: 71 mg/dL (ref 70–99)

## 2022-06-19 NOTE — TOC Initial Note (Addendum)
Transition of Care [facility]) - Discharge Note    Patient Details  Name: Randall [name] MRN: [id number] Date of Birth: December 13, 1950  [facility] of [facility]) CM/SW Contact:    Laurena Slimmer, RN Phone Number: 06/19/2022, 9:12 AM  Clinical Narrative:                 Spoke with patient's wife regarding discharge plan for today. Patient wife is requesting to speak with MD regarding patient's most recent fall. MD notified.   EMS rescheduled for 10:30 am  10:20am Spoke with patient's wife to advise EMS was scheduled for 10:30 and that a hospice represetative would contact her. TOC signing off.   Expected Discharge Plan: [place] Barriers to Discharge: Continued Medical Work up   Patient Goals and CMS Choice Patient states their goals for this hospitalization and ongoing recovery are:: Home with [facility] CMS Medicare.gov Compare Post Acute Care list provided to:: Patient Represenative (must comment) (Wife [name]) Choice offered to / list presented to : Spouse  Expected Discharge Plan and Services Expected Discharge Plan: [place] Acute Care Choice: [place] arrangements for the past 2 months: Single Family Home                                      Prior Living Arrangements/Services Living arrangements for the past 2 months: Single Family Home Lives with:: Spouse Patient language and need for interpreter reviewed:: Yes Do you feel safe going back to the place where you live?: Yes      Need for Family Participation in Patient Care: Yes (Comment) Care giver support system in place?: Yes (comment) Current home services: Home OT, Home PT, Homehealth aide, Other (comment) (SLP) Criminal Activity/Legal Involvement Pertinent to Current Situation/Hospitalization: No - Comment as needed  Activities of Daily Living Home Assistive Devices/Equipment: Walker (specify type) ADL Screening (condition at time of admission) Patient's cognitive  ability adequate to safely complete daily activities?: No Is the patient deaf or have difficulty hearing?: No Does the patient have difficulty seeing, even when wearing glasses/contacts?: No Does the patient have difficulty concentrating, remembering, or making decisions?: Yes Patient able to express need for assistance with ADLs?: Yes Does the patient have difficulty dressing or bathing?: Yes Independently performs ADLs?: No Communication: Independent Dressing (OT): Needs assistance Is this a change from baseline?: Pre-admission baseline Grooming: Needs assistance Is this a change from baseline?: Pre-admission baseline Feeding: Needs assistance Is this a change from baseline?: Pre-admission baseline Bathing: Needs assistance Is this a change from baseline?: Pre-admission baseline Toileting: Needs assistance Is this a change from baseline?: Pre-admission baseline In/Out Bed: Needs assistance Is this a change from baseline?: Pre-admission baseline Walks in Home: Needs assistance Is this a change from baseline?: Pre-admission baseline Does the patient have difficulty walking or climbing stairs?: Yes Weakness of Legs: Both Weakness of Arms/Hands: Both  Permission Sought/Granted Permission sought to share information with : Case Manager Permission granted to share information with : Yes, Verbal Permission Granted  Share Information with NAME: Desiree  Permission granted to share info w AGENCY: [facility]        Emotional Assessment Appearance:: Appears stated age     Orientation: : Oriented to Self   Psych Involvement: No (comment)  Admission diagnosis:  Rhabdomyolysis [M62.82] Non-traumatic rhabdomyolysis [M62.82] Patient Active Problem List   Diagnosis Date  Noted   Hypokalemia 05/26/2022   Failure to thrive in adult 05/25/2022   Weakness 05/25/2022   Constipation 05/23/2022   Acute delirium 05/22/2022   Dementia with behavioral disturbance ([place]) 05/22/2022   Dysphagia  05/21/2022   Hyponatremia 05/20/2022   Rhabdomyolysis 05/17/2022   Fall at home, initial encounter 05/17/2022   Leukocytosis 05/17/2022   Transaminitis 05/17/2022   Hyperbilirubinemia 05/17/2022   Adenoma of large intestine 02/01/2016   Clinical depression 02/01/2016   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 [place] [place]Phone: [phone]: [phone]    Social Determinants of Health (SDOH) Interventions Utilities Interventions: Intervention Not Indicated  Readmission Risk Interventions     No data to display

## 2022-06-19 NOTE — Care Management Important Message (Signed)
Important Message  Patient Details  Name: Randall [name] MRN: [id number] Date of Birth: 04-Oct-1950   Medicare Important Message Given:  Other (see comment)  Patient is discharging home with hospice and out of respect for the patient and family no Important Message from [facility] given.   Juliann Pulse A Sohail Capraro 06/19/2022, 8:44 AM

## 2022-06-19 NOTE — Progress Notes (Signed)
Discharge plan discussed with Mrs. Acy over the phone. Patient will be discharged home today with hospice.

## 2022-06-19 NOTE — Discharge Summary (Signed)
Physician Discharge Summary   Patient: Randall [name] MRN: [id number] DOB: August 28, 1951  Admit date:     05/17/2022  Discharge date: 06/19/22  Discharge Physician: Jennye Boroughs   PCP: Adin Hector, MD   Recommendations at discharge:   Follow-up with the hospice team at home within 24 hours of discharge  Discharge Diagnoses: Principal Problem:   Failure to thrive in adult Active Problems:   Rhabdomyolysis   Acute delirium   Parkinson's disease   Fall at home, initial encounter   Leukocytosis   Hyponatremia   Type 2 diabetes mellitus with hyperlipidemia (HCC)   Transaminitis   Hyperbilirubinemia   Dysphagia   Dementia with behavioral disturbance (HCC)   Constipation   Weakness   Hypokalemia  Resolved Problems:   * No resolved hospital problems. [facility] Course:  [name] is a 71 y.o. male with medical history significant of hypertension, hyperlipidemia, diabetes mellitus type 2, Parkinson's disease with dementia presents after having a fall at home.  Patient was on the floor for about 12 hours.  By time he arrived in the hospital, he was found to have significant rhabdomyolysis with CK level of 8278.  Renal function still normal.  He was placed on IV fluids. Patient CK level was normalized on 9/12.  However, patient developed some confusion/ delirium as well as dysphagia.  PT/OT recommended nursing home placement.  Speech therapist recommended dysphagia 3 diet.   On 05/24/2022 urine very dark.  Patient given a fluid bolus and urine cleared up a little bit.  No blood in the urine.   On 05/25/2022.  Patient was having facial tremor and difficulty eating and taking meds.   05/26/2022 through 05/28/2022.  The patient has not been eating very much.  His urine is dark.  Bilirubin slightly high.  Previous urine analysis did not show any blood in the urine.  The patient wants to get out of the hospital.  On 05/28/2022 and his wife chose a bed at [facility] and  insurance authorization is undergoing.   9/20: Received notification from [facility] that patient's insurance carrier has declined skilled nursing facility authorization.  Peer to peer with medical director at [facility] (Dr. Coralie Carpen at [phone]) was done by Dr. Priscella Mann.  Denial was upheld.   9/22: Denial upheld through [facility].  TOC engaged and attempting to assist with assisted living facility placement.   9/27: Insurance authorization for SNF denied again per Donny Pique, Tourist information centre manager.  Unfortunately, authorization for discharge to SNF was denied twice.  Disposition became an issue because patient's wife said that she would not be able to care for patient at home.  She decided to go with hospice services.  Patient was deemed eligible for hospice services at home.  His wife was still concerned that hospice team will not be available 24 hours a day.  Unfortunately, she did not have any other option.  It was explained to the patient that if patient's condition deteriorates, he may be eligible for hospice care at the hospice house.  Patient is deemed medically stable for discharge to home with hospice.  Discharge plan was discussed with his wife who reluctantly agreed with the plan.         Consultants: Palliative care Procedures performed: None  Disposition:  Home with hospice Diet recommendation:  Discharge Diet Orders (From admission, onward)     Start     Ordered   06/19/22 0000  DIET DYS 3  Question:  Fluid consistency:  Answer:  Thin   06/19/22 0922           Dysphagia type 3 thin Liquid DISCHARGE MEDICATION: Allergies as of 06/19/2022       Reactions   Sulfa Antibiotics Nausea Only   Sulfasalazine Nausea Only   Lovastatin Other (See Comments)   intolerant   Methylphenidate Hcl Other (See Comments)   dyskinesia   Pravastatin Other (See Comments)   intolerant        Medication List     STOP taking these medications    azelastine 0.1 % nasal spray Commonly known as:  ASTELIN   glimepiride 4 MG tablet Commonly known as: AMARYL   Nuplazid 34 MG Caps Generic drug: Pimavanserin Tartrate       TAKE these medications    atropine 1 % ophthalmic solution One drop under tongue as needed for drooling.   carbidopa-levodopa 25-250 MG tablet Commonly known as: SINEMET IR Take 1 tablet by mouth 4 (four) times daily. What changed: how much to take   carbidopa-levodopa 50-200 MG tablet Commonly known as: SINEMET CR Take 1 tablet by mouth at bedtime. What changed: Another medication with the same name was changed. Make sure you understand how and when to take each.   diphenhydrAMINE 25 MG tablet Commonly known as: BENADRYL Take 25 mg by mouth at bedtime as needed.   fluticasone 50 MCG/ACT nasal spray Commonly known as: FLONASE   gabapentin 300 MG capsule Commonly known as: NEURONTIN Take 300 mg by mouth 3 (three) times daily.   glucose blood test strip   ibuprofen 200 MG tablet Commonly known as: ADVIL Take 200 mg by mouth every 6 (six) hours as needed.   Kynmobi 15 MG Film Generic drug: Apomorphine HCl Take 1 Film by mouth. 1-2 times daily   magnesium oxide 400 (240 Mg) MG tablet Commonly known as: MAG-OX Take 400 mg by mouth daily.   metFORMIN 500 MG tablet Commonly known as: GLUCOPHAGE Take 500 mg by mouth 2 (two) times daily.   QUEtiapine 50 MG tablet Commonly known as: SEROQUEL Take 75 mg by mouth at bedtime.   rivastigmine 1.5 MG capsule Commonly known as: EXELON Take 1.5 mg by mouth 2 (two) times daily.   rOPINIRole 2 MG tablet Commonly known as: REQUIP Take 1 tablet (2 mg total) by mouth at bedtime. What changed: when to take this        Contact information for after-discharge care     [facility] .   Service: Skilled Nursing Contact information: [place] [phone]                    Discharge Exam: Danley Danker  Weights   05/20/22 1527 05/21/22 0631 06/03/22 0300  Weight: 79 kg 76.7 kg 75.3 kg   GEN: NAD SKIN: Warm and dry EYES: No pallor or icterus ENT: MMM CV: RRR PULM: CTA B ABD: soft, ND, NT, +BS CNS: AAO x 1 (person), speech is soft, non focal EXT: No edema or tenderness   Condition at discharge: stable  The results of significant diagnostics from this hospitalization (including imaging, microbiology, ancillary and laboratory) are listed below for reference.   Imaging Studies: DG Thoracic Spine 2 View  Result Date: 06/02/2022 CLINICAL DATA:  Back pain, fall EXAM: THORACIC SPINE 2 VIEWS COMPARISON:  01/29/2022 FINDINGS: There is no evidence of thoracic spine fracture. Alignment is normal. Disc  heights are relatively well preserved. Possible nondisplaced fracture of the posterior left eleventh rib at the costovertebral junction. IMPRESSION: 1. No acute fracture or malalignment of the thoracic spine. 2. Possible fracture of the posterior left eleventh rib at the costovertebral junction. Correlate with point tenderness. Electronically Signed   By: Davina Poke D.O.   On: 06/02/2022 14:10   DG Lumbar Spine 2-3 Views  Result Date: 06/02/2022 CLINICAL DATA:  Back pain, fall abdominal aortic atherosclerosis. EXAM: LUMBAR SPINE - 2-3 VIEW COMPARISON:  None Available. FINDINGS: Five lumbar type vertebral segments. Vertebral body heights and alignment are maintained. No fracture identified. Intervertebral disc spaces are relatively preserved. Minimal degenerative endplate changes. Mild lower lumbar facet arthrosis. IMPRESSION: Negative. Electronically Signed   By: Davina Poke D.O.   On: 06/02/2022 14:07   DG Cervical Spine 2 or 3 views  Result Date: 06/02/2022 CLINICAL DATA:  Frequent falls. EXAM: CERVICAL SPINE - 2-3 VIEW COMPARISON:  None Available. FINDINGS: Obliqued lateral view, which excludes C6 and C7 limits evaluation. There is no evidence of cervical spine fracture or prevertebral  soft tissue swelling. Alignment is normal. No other significant bone abnormalities are identified. IMPRESSION: Negative cervical spine radiographs. Electronically Signed   By: Fidela Salisbury M.D.   On: 06/02/2022 13:49   CT HEAD WO CONTRAST (5MM)  Result Date: 06/02/2022 CLINICAL DATA:  Head trauma.  Fall. EXAM: CT HEAD WITHOUT CONTRAST TECHNIQUE: Contiguous axial images were obtained from the base of the skull through the vertex without intravenous contrast. RADIATION DOSE REDUCTION: This exam was performed according to the departmental dose-optimization program which includes automated exposure control, adjustment of the mA and/or kV according to patient size and/or use of iterative reconstruction technique. COMPARISON:  May 17, 2022 FINDINGS: Brain: No evidence of acute infarction, hemorrhage, hydrocephalus, extra-axial collection or mass lesion/mass effect. Vascular: No hyperdense vessel or unexpected calcification. Skull: Chronic changes at the right TMJ. Sinuses/Orbits: No acute finding. Other: None. IMPRESSION: 1. No acute intracranial abnormalities. 2. Chronic changes at the right TMJ. Electronically Signed   By: Dorise Bullion III M.D.   On: 06/02/2022 12:22   DG Abd 2 Views  Result Date: 05/27/2022 CLINICAL DATA:  Constipation. EXAM: ABDOMEN - 2 VIEW COMPARISON:  None Available. FINDINGS: The bowel gas pattern is nonobstructive. Colonic stool burden is within physiologic limits. No evidence of free intraperitoneal air or suspicious abdominal calcification. There are postsurgical changes in the pelvis consistent with previous hernia repair. Mild spondylosis noted without acute osseous findings. IMPRESSION: Normal nonobstructive bowel gas pattern. No radiographic evidence of significant constipation. Electronically Signed   By: Richardean Sale M.D.   On: 05/27/2022 08:33    Microbiology: No results found for this or any previous visit.  Labs: CBC: No results for input(s): "WBC",  "NEUTROABS", "HGB", "HCT", "MCV", "PLT" in the last 168 hours. Basic Metabolic Panel: No results for input(s): "NA", "K", "CL", "CO2", "GLUCOSE", "BUN", "CREATININE", "CALCIUM", "MG", "PHOS" in the last 168 hours. Liver Function Tests: No results for input(s): "AST", "ALT", "ALKPHOS", "BILITOT", "PROT", "ALBUMIN" in the last 168 hours. CBG: Recent Labs  Lab 06/18/22 0817 06/18/22 1125 06/18/22 1630 06/18/22 1940 06/19/22 0752  GLUCAP 121* 155* 116* 161* 71    Discharge time spent: greater than 30 minutes.  Signed: Jennye Boroughs, MD [facility] 06/19/2022

## 2022-06-26 DIAGNOSIS — Z743 Need for continuous supervision: Secondary | ICD-10-CM | POA: Diagnosis not present

## 2022-06-26 DIAGNOSIS — R279 Unspecified lack of coordination: Secondary | ICD-10-CM | POA: Diagnosis not present

## 2022-07-02 DIAGNOSIS — R1310 Dysphagia, unspecified: Secondary | ICD-10-CM | POA: Diagnosis not present

## 2022-07-02 DIAGNOSIS — R634 Abnormal weight loss: Secondary | ICD-10-CM | POA: Diagnosis not present

## 2022-07-02 DIAGNOSIS — K5792 Diverticulitis of intestine, part unspecified, without perforation or abscess without bleeding: Secondary | ICD-10-CM | POA: Diagnosis not present

## 2022-07-02 DIAGNOSIS — M6282 Rhabdomyolysis: Secondary | ICD-10-CM | POA: Diagnosis not present

## 2022-07-02 DIAGNOSIS — F0283 Dementia in other diseases classified elsewhere, unspecified severity, with mood disturbance: Secondary | ICD-10-CM | POA: Diagnosis not present

## 2022-07-02 DIAGNOSIS — E119 Type 2 diabetes mellitus without complications: Secondary | ICD-10-CM | POA: Diagnosis not present

## 2022-07-02 DIAGNOSIS — G20A1 Parkinson's disease without dyskinesia, without mention of fluctuations: Secondary | ICD-10-CM | POA: Diagnosis not present

## 2022-07-02 DIAGNOSIS — I10 Essential (primary) hypertension: Secondary | ICD-10-CM | POA: Diagnosis not present

## 2022-07-11 DIAGNOSIS — E119 Type 2 diabetes mellitus without complications: Secondary | ICD-10-CM | POA: Diagnosis not present

## 2022-07-11 DIAGNOSIS — G20A1 Parkinson's disease without dyskinesia, without mention of fluctuations: Secondary | ICD-10-CM | POA: Diagnosis not present

## 2022-07-11 DIAGNOSIS — F0283 Dementia in other diseases classified elsewhere, unspecified severity, with mood disturbance: Secondary | ICD-10-CM | POA: Diagnosis not present

## 2022-07-11 DIAGNOSIS — K5792 Diverticulitis of intestine, part unspecified, without perforation or abscess without bleeding: Secondary | ICD-10-CM | POA: Diagnosis not present

## 2022-07-11 DIAGNOSIS — M6282 Rhabdomyolysis: Secondary | ICD-10-CM | POA: Diagnosis not present

## 2022-07-11 DIAGNOSIS — R634 Abnormal weight loss: Secondary | ICD-10-CM | POA: Diagnosis not present

## 2022-07-11 DIAGNOSIS — R1310 Dysphagia, unspecified: Secondary | ICD-10-CM | POA: Diagnosis not present

## 2022-09-30 DIAGNOSIS — F0282 Dementia in other diseases classified elsewhere, unspecified severity, with psychotic disturbance: Secondary | ICD-10-CM | POA: Diagnosis not present

## 2022-09-30 DIAGNOSIS — R1310 Dysphagia, unspecified: Secondary | ICD-10-CM | POA: Diagnosis not present

## 2022-09-30 DIAGNOSIS — F0283 Dementia in other diseases classified elsewhere, unspecified severity, with mood disturbance: Secondary | ICD-10-CM | POA: Diagnosis not present

## 2022-09-30 DIAGNOSIS — F0284 Dementia in other diseases classified elsewhere, unspecified severity, with anxiety: Secondary | ICD-10-CM | POA: Diagnosis not present

## 2022-09-30 DIAGNOSIS — R634 Abnormal weight loss: Secondary | ICD-10-CM | POA: Diagnosis not present

## 2022-09-30 DIAGNOSIS — F02811 Dementia in other diseases classified elsewhere, unspecified severity, with agitation: Secondary | ICD-10-CM | POA: Diagnosis not present

## 2022-09-30 DIAGNOSIS — G20A1 Parkinson's disease without dyskinesia, without mention of fluctuations: Secondary | ICD-10-CM | POA: Diagnosis not present

## 2022-12-09 DEATH — deceased

## 2022-12-19 ENCOUNTER — Ambulatory Visit: Payer: PPO | Admitting: Dermatology

## 2023-06-06 IMAGING — CR DG SHOULDER 2+V*L*
3 series · 3 of 3 positions shown · non-contrast
Comparison: None

CLINICAL DATA: Tripped and fell with shoulder pain

EXAM:
LEFT SHOULDER - 2+ VIEW

[shoulder grashey]
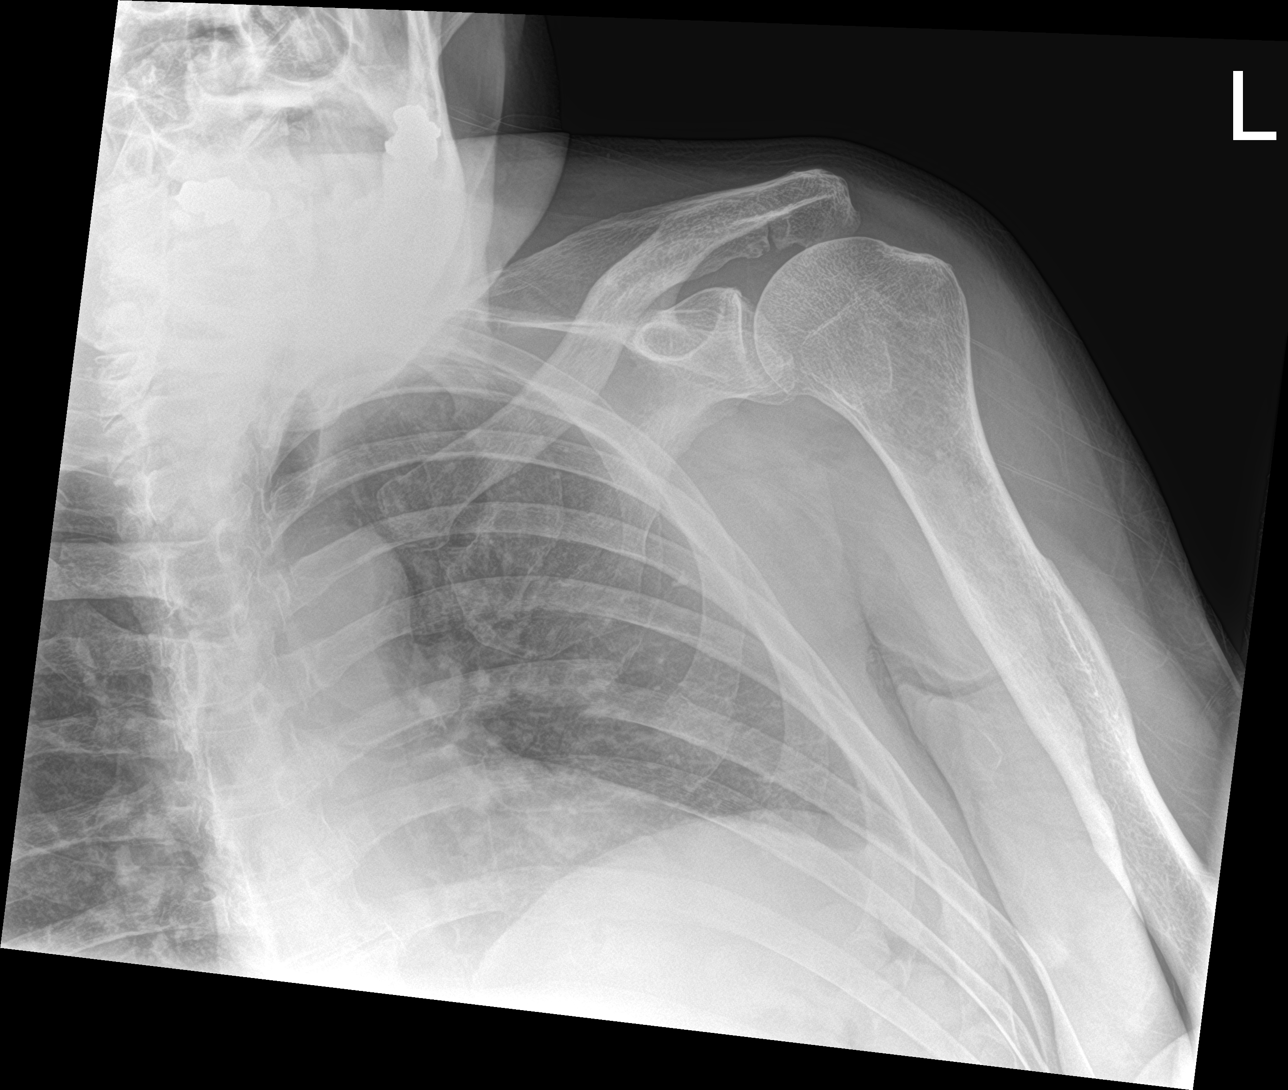

[shoulder y view]
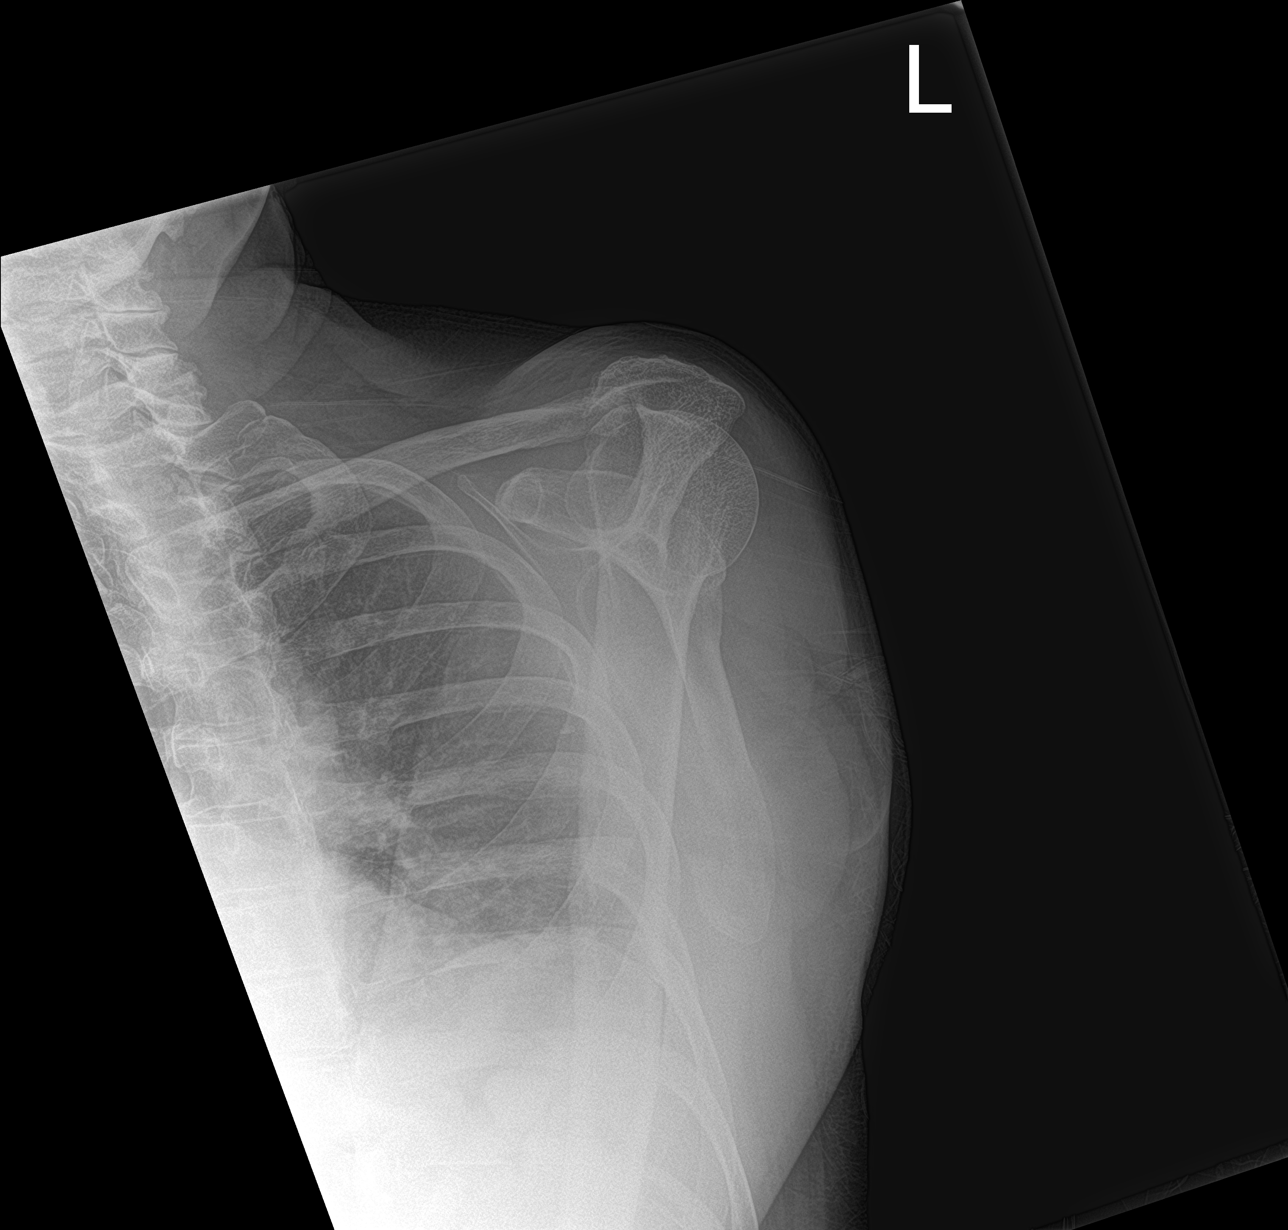

[shoulder ap neutral]
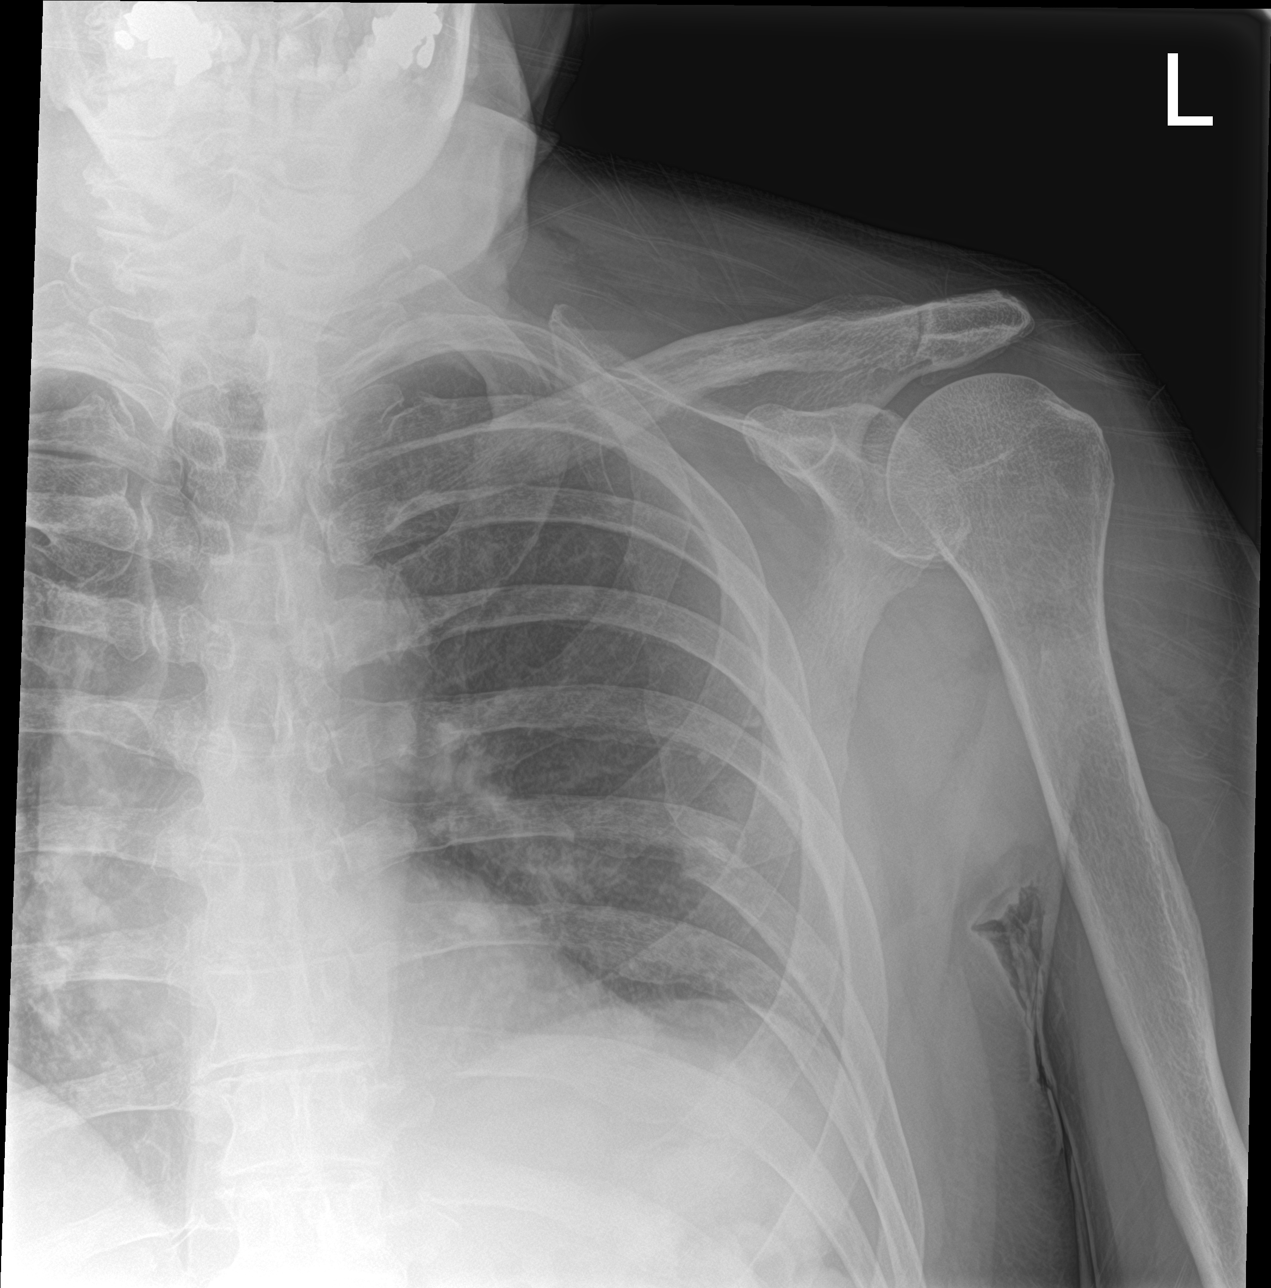

[3 of 3 positions shown; findings below may reference images not displayed]

FINDINGS: There is no evidence of fracture or dislocation. There is no
evidence of arthropathy or other focal bone abnormality. Soft
tissues are unremarkable.

Acute fracture of the left posterior seventh rib.
IMPRESSION: No acute shoulder region finding.

Acute fracture of the left posterior seventh rib. No visible
pneumothorax.

## 2023-06-06 IMAGING — CT CT CERVICAL SPINE W/O CM
3 of 4 series · 12 of 34 positions shown, 14 images · non-contrast
Comparison: CT head November 14, 21.

CLINICAL DATA: Head trauma, moderate-severe; Neck trauma (Age >=
65y); Facial trauma, blunt



[Series 4: sagittal bone · sagittal · 0.35mm/px · 5 of 79 slices shown, 6 images]
[im 27/79  bone]
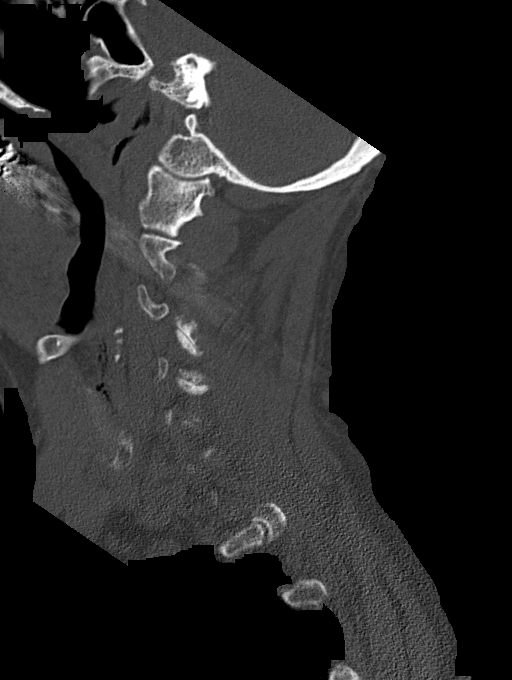
[im 33/79  bone]
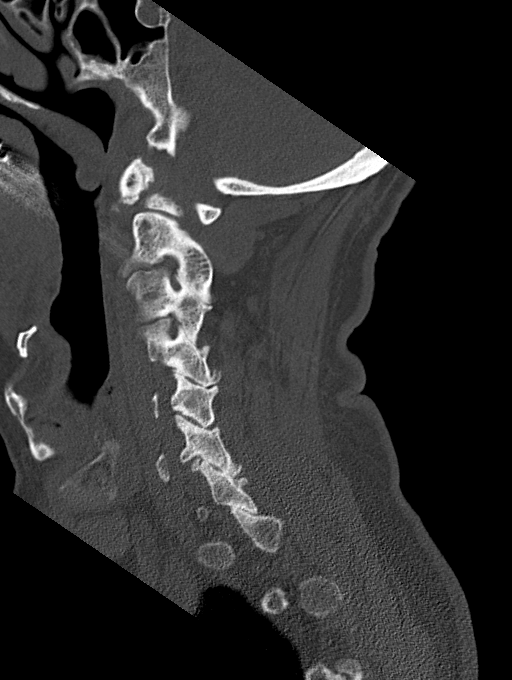
[im 40/79  soft-tissue]
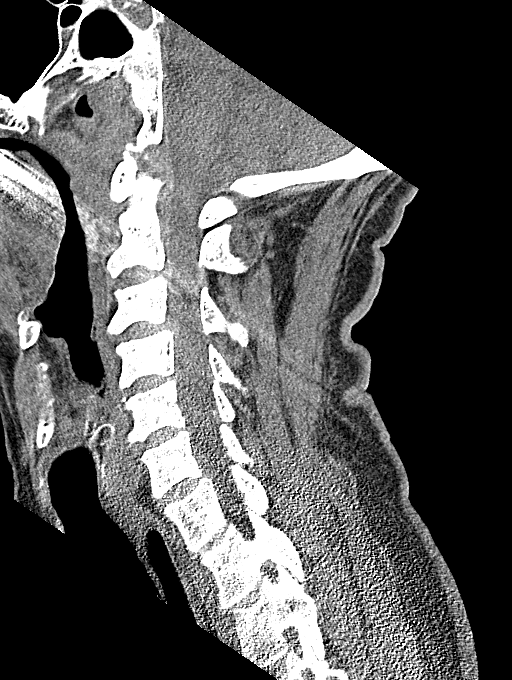
[im 40/79  bone]
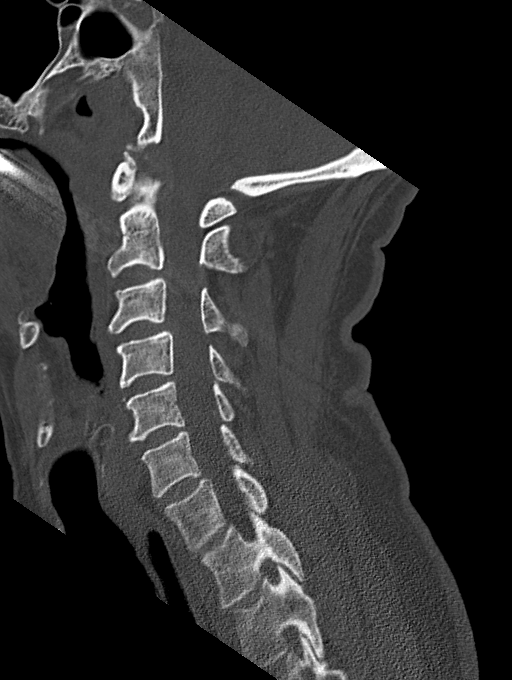
[im 46/79  bone]
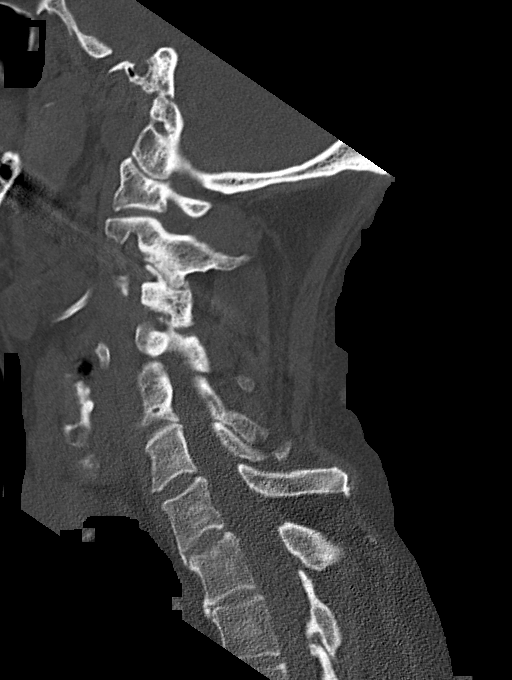
[im 53/79  bone]
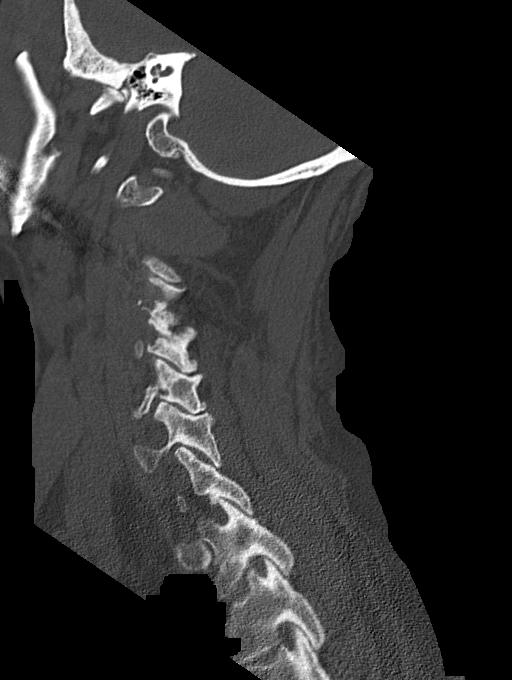

[Series 5: coronal bone · coronal · 0.30mm/px · 3 of 89 slices shown]
[im 31/89  bone]
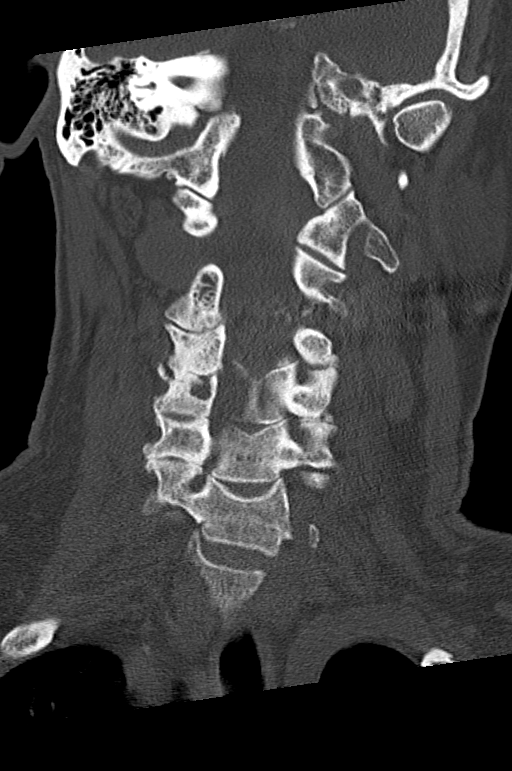
[im 40/89  bone]
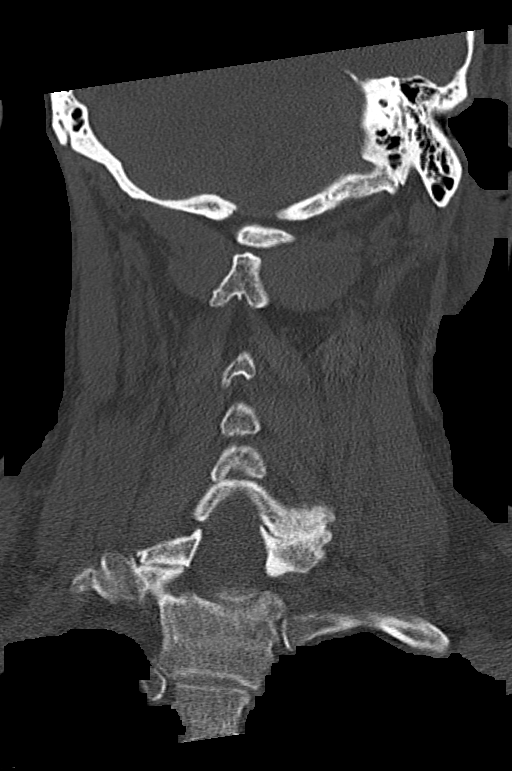
[im 49/89  bone]
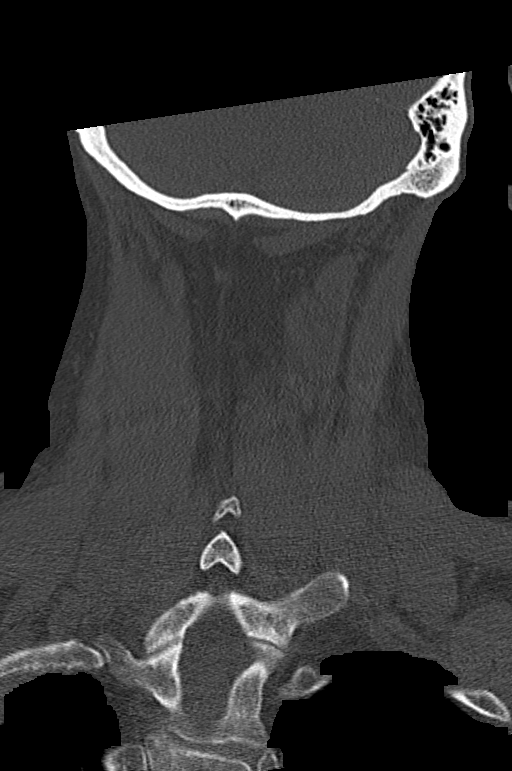

[Series 6: orthogonal axials · axial · 0.30mm/px · z∈[-245,-107]mm · 4 of 118 slices shown, 5 images]
[im 17/118  soft-tissue]
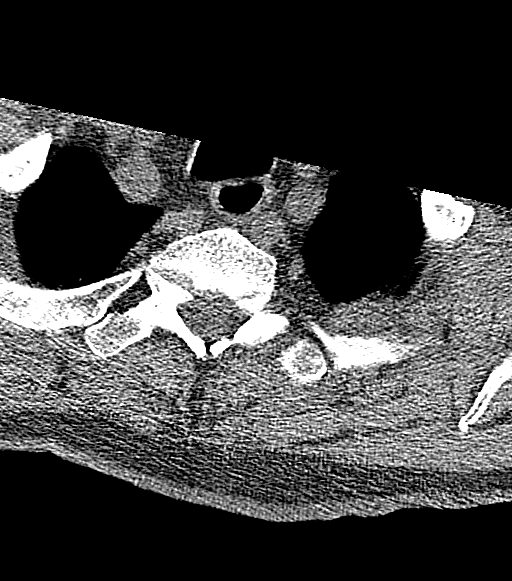
[im 17/118  bone]
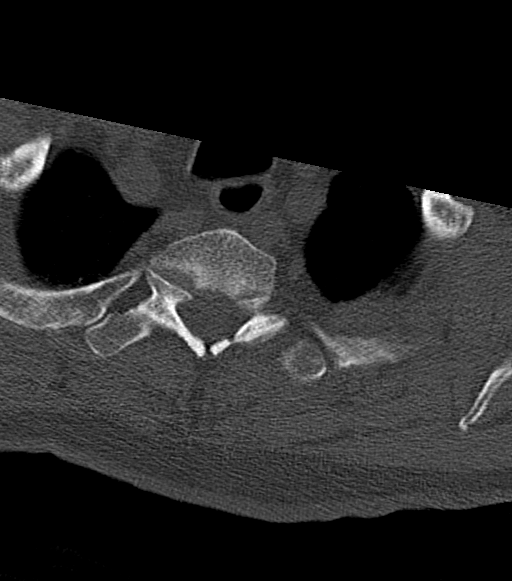
[im 51/118  bone]
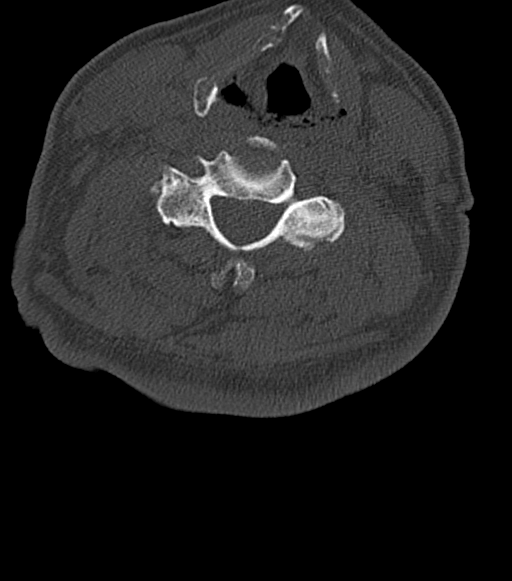
[im 67/118  bone]
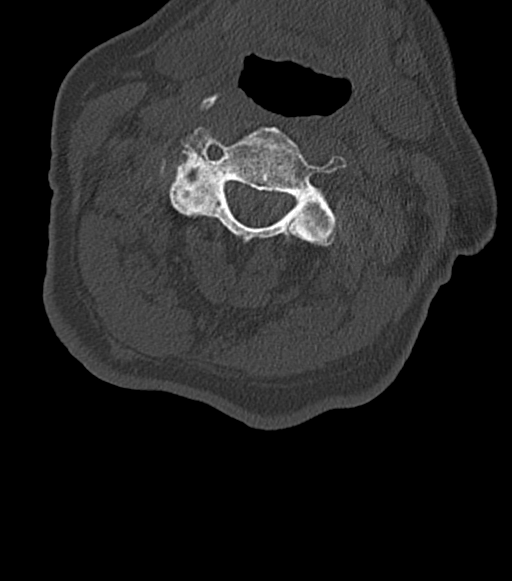
[im 101/118  bone]
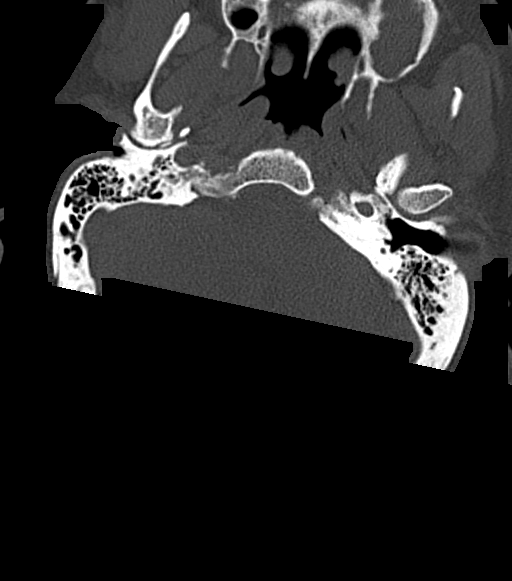

[12 of 34 positions shown; findings below may reference images not displayed]

FINDINGS: CT HEAD FINDINGS

Brain: No evidence of acute infarction, hemorrhage, hydrocephalus,
extra-axial collection or mass lesion/mass effect.

Vascular: No hyperdense vessel identified.

Skull: Left forehead contusion/laceration.  No acute fracture.

Other: No mastoid effusions.

CT MAXILLOFACIAL FINDINGS

Osseous: No fracture or mandibular dislocation. Right TMJ
degenerative change.

Orbits: Periorbital contusion.  Otherwise, no traumatic findings.

Sinuses: Near complete left maxillary sinus opacification with
mucosal thickening

Soft tissues: Left periorbital/forehead contusion.

CT CERVICAL SPINE FINDINGS

Alignment: No substantial sagittal subluxation.

Skull base and vertebrae: Vertebral body heights are maintained. No
evidence of acute fracture.

Soft tissues and spinal canal: No prevertebral fluid or swelling. No
visible canal hematoma.

Disc levels: Multilevel facet uncovertebral hypertrophy, greatest
and severe on the right at C3-C4. Resulting varying degrees of
neural foraminal stenosis. Multilevel degenerative disc disease is
mild.

Upper chest: Visualized lung apices are clear.
IMPRESSION: CT head and CT maxillofacial:

1. No evidence of acute intracranial abnormality.
2. Left periorbital/forehead contusion without acute fracture.
3. Near complete left maxillary sinus opacification.

CT cervical spine

1. No evidence of acute fracture or traumatic malalignment.
2. Multilevel degenerative change, described above.

## 2023-06-06 IMAGING — CR DG KNEE COMPLETE 4+V*L*
4 series · 4 of 4 positions shown · non-contrast
Comparison: No pertinent prior exams available for comparison.

CLINICAL DATA: Provided history: Fall/pain.

EXAM:
LEFT KNEE - COMPLETE 4+ VIEW

[knee ap]
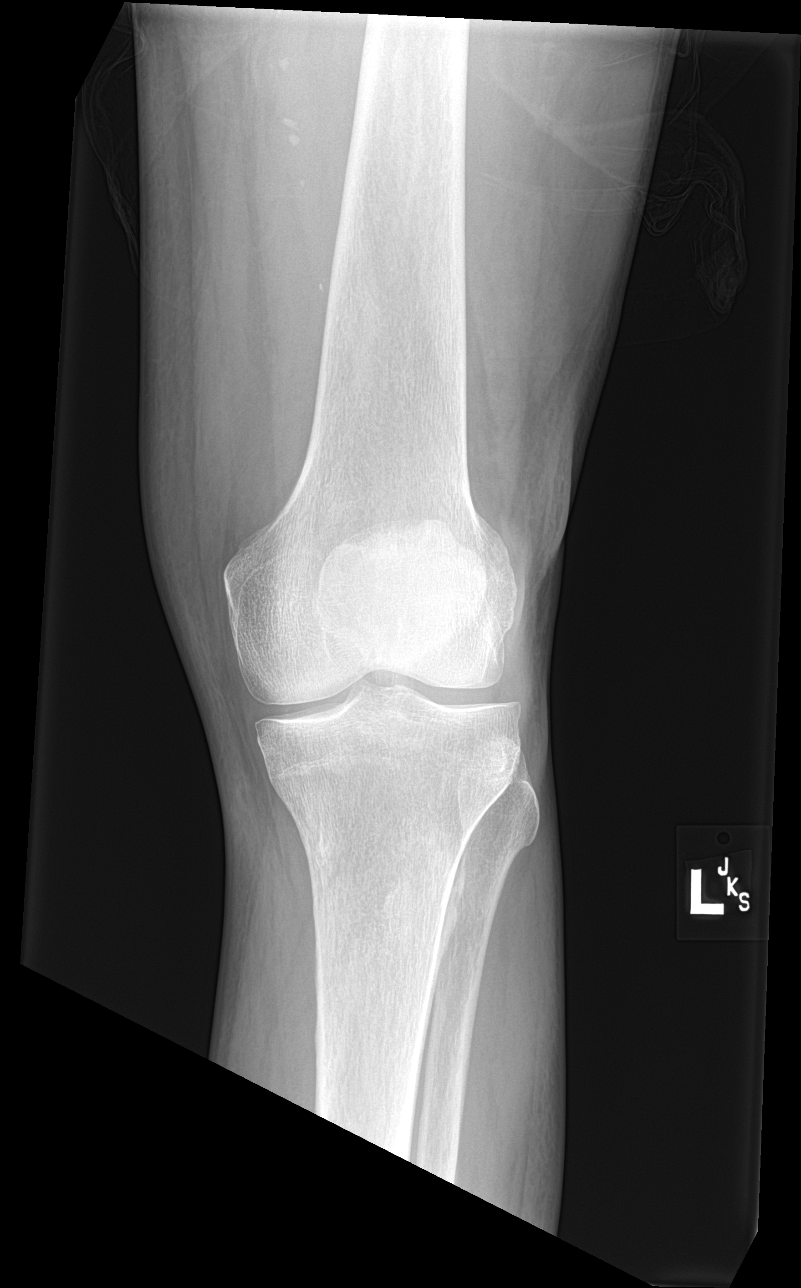

[knee obl (1 of 2)]
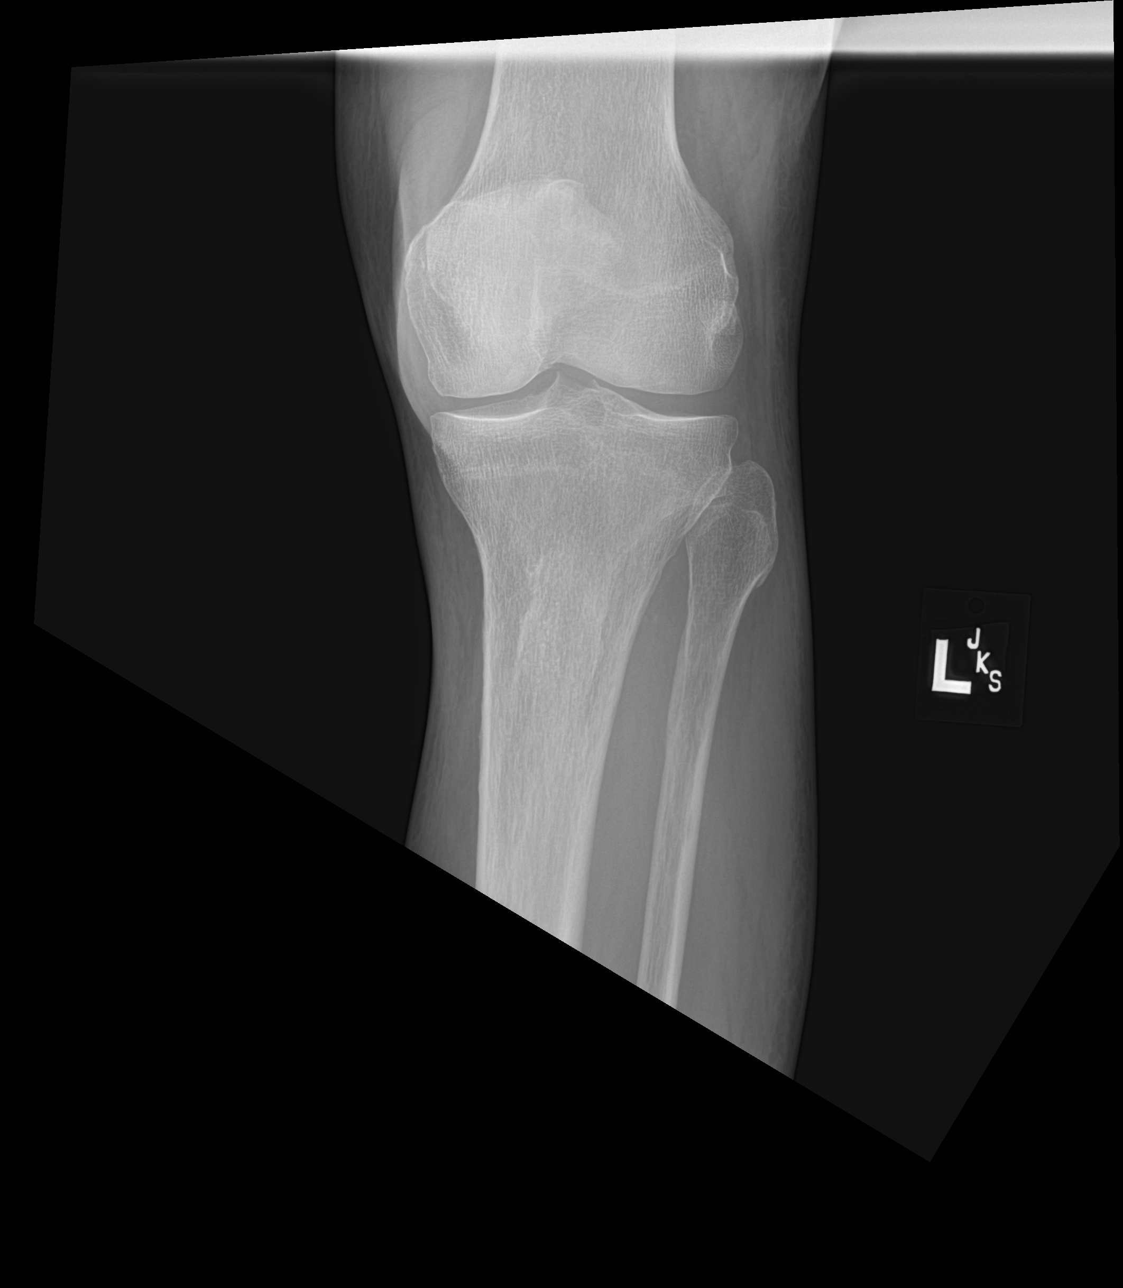

[knee obl (2 of 2)]
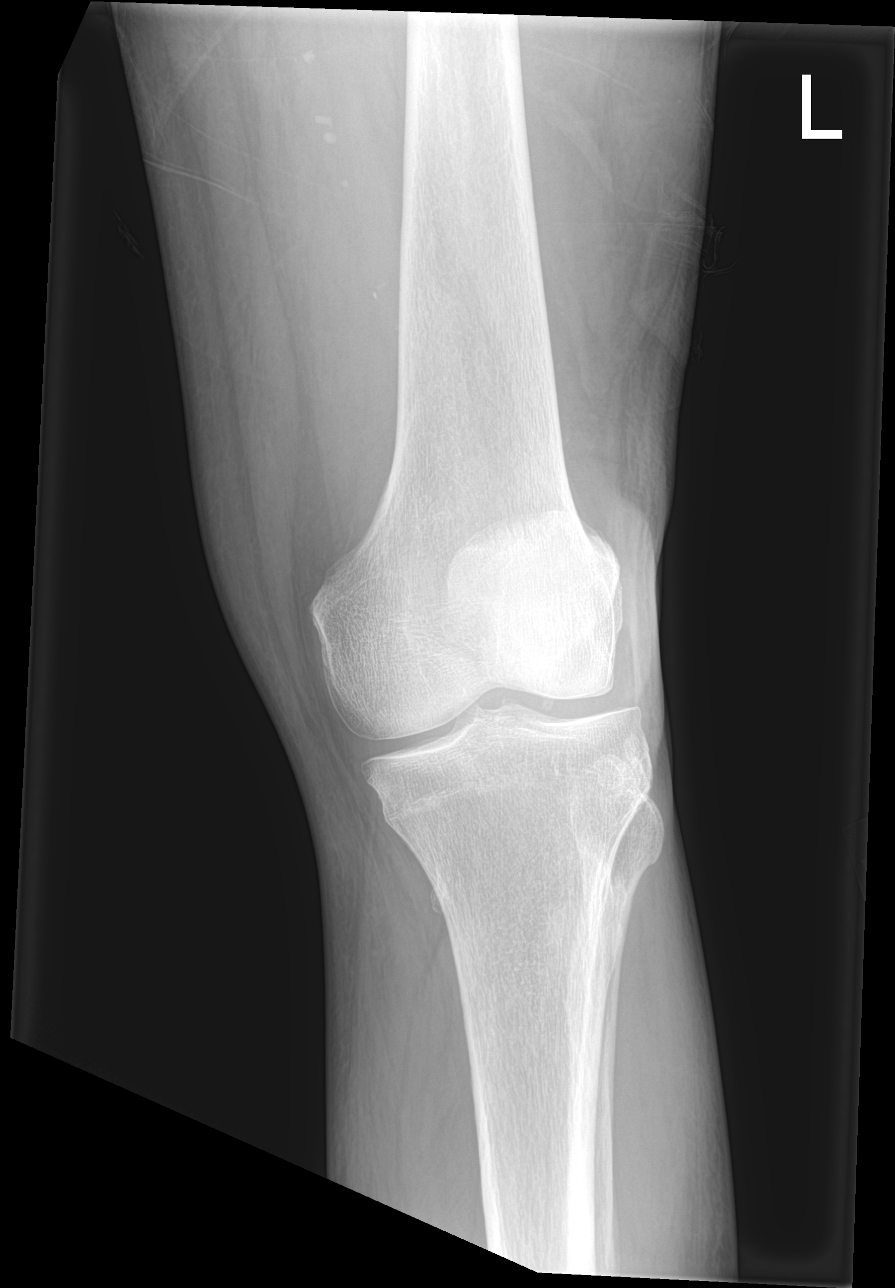

[knee lat]
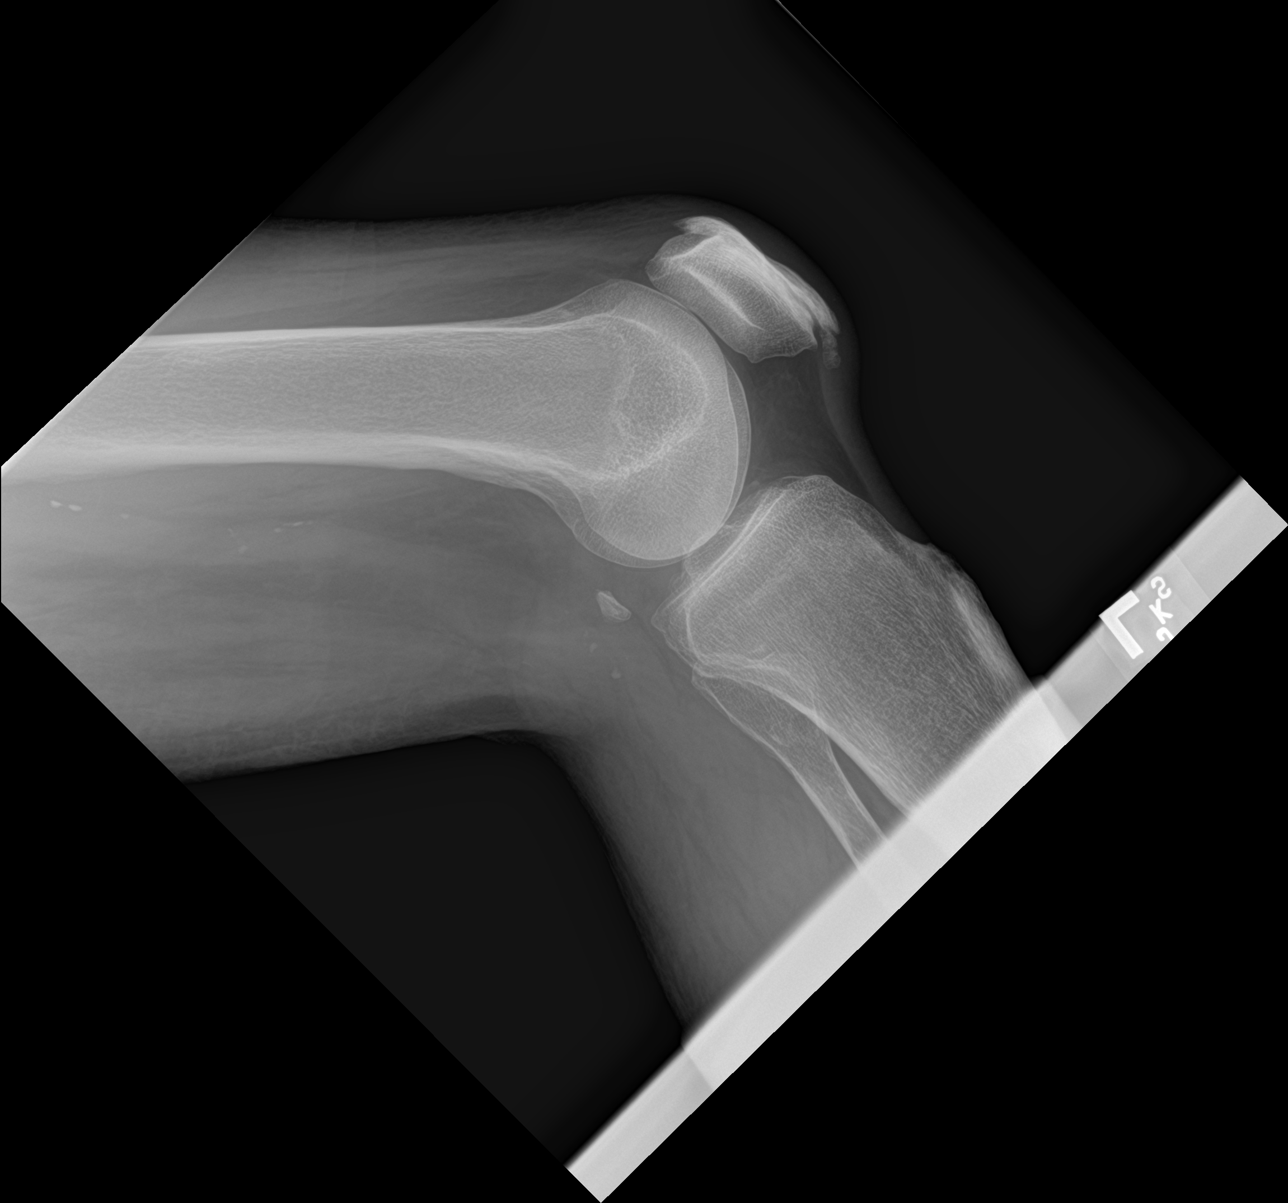

[4 of 4 positions shown; findings below may reference images not displayed]

FINDINGS: There is normal bony alignment.

No evidence of acute osseous or articular abnormality.

The joint spaces are maintained.
IMPRESSION: No evidence of acute osseous or articular abnormality.
